# Patient Record
Sex: Female | Born: 1970 | Race: White | Hispanic: No
Health system: Midwestern US, Community
[De-identification: ages and names within clinical notes are randomized; demographics above are authoritative.]

## PROBLEM LIST (undated history)

## (undated) DIAGNOSIS — R768 Other specified abnormal immunological findings in serum: Secondary | ICD-10-CM

## (undated) DIAGNOSIS — Z0000 Encounter for general adult medical examination without abnormal findings: Secondary | ICD-10-CM

## (undated) DIAGNOSIS — I38 Endocarditis, valve unspecified: Secondary | ICD-10-CM

## (undated) DIAGNOSIS — F4310 Post-traumatic stress disorder, unspecified: Secondary | ICD-10-CM

## (undated) DIAGNOSIS — R000 Tachycardia, unspecified: Secondary | ICD-10-CM

## (undated) DIAGNOSIS — C801 Malignant (primary) neoplasm, unspecified: Secondary | ICD-10-CM

## (undated) DIAGNOSIS — N179 Acute kidney failure, unspecified: Secondary | ICD-10-CM

## (undated) DIAGNOSIS — I2699 Other pulmonary embolism without acute cor pulmonale: Secondary | ICD-10-CM

## (undated) DIAGNOSIS — F419 Anxiety disorder, unspecified: Secondary | ICD-10-CM

## (undated) DIAGNOSIS — F32A Depression, unspecified: Secondary | ICD-10-CM

## (undated) DIAGNOSIS — F209 Schizophrenia, unspecified: Secondary | ICD-10-CM

## (undated) DIAGNOSIS — I7300 Raynaud's syndrome without gangrene: Secondary | ICD-10-CM

## (undated) DIAGNOSIS — F329 Major depressive disorder, single episode, unspecified: Secondary | ICD-10-CM

## (undated) DIAGNOSIS — C55 Malignant neoplasm of uterus, part unspecified: Secondary | ICD-10-CM

## (undated) DIAGNOSIS — D72829 Elevated white blood cell count, unspecified: Secondary | ICD-10-CM

## (undated) DIAGNOSIS — K769 Liver disease, unspecified: Secondary | ICD-10-CM

## (undated) HISTORY — PX: UTERINE FIBROID SURGERY: SHX826

## (undated) HISTORY — PX: NO PAST SURGERIES: SHX2092

---

## 2001-07-07 ENCOUNTER — Emergency Department (HOSPITAL_COMMUNITY): Admission: EM | Admit: 2001-07-07 | Discharge: 2001-07-07 | Payer: Self-pay | Admitting: *Deleted

## 2003-09-01 ENCOUNTER — Emergency Department (HOSPITAL_COMMUNITY): Admission: EM | Admit: 2003-09-01 | Discharge: 2003-09-02 | Payer: Self-pay | Admitting: Emergency Medicine

## 2003-09-04 ENCOUNTER — Emergency Department (HOSPITAL_COMMUNITY): Admission: EM | Admit: 2003-09-04 | Discharge: 2003-09-04 | Payer: Self-pay | Admitting: Emergency Medicine

## 2004-09-13 ENCOUNTER — Emergency Department (HOSPITAL_COMMUNITY): Admission: EM | Admit: 2004-09-13 | Discharge: 2004-09-13 | Payer: Self-pay | Admitting: Emergency Medicine

## 2004-09-28 ENCOUNTER — Ambulatory Visit: Payer: Self-pay | Admitting: Internal Medicine

## 2004-09-28 ENCOUNTER — Encounter: Admission: RE | Admit: 2004-09-28 | Discharge: 2004-09-28 | Payer: Self-pay | Admitting: Family Medicine

## 2004-10-16 ENCOUNTER — Ambulatory Visit (HOSPITAL_COMMUNITY): Admission: RE | Admit: 2004-10-16 | Discharge: 2004-10-16 | Payer: Self-pay | Admitting: Family Medicine

## 2004-10-23 ENCOUNTER — Ambulatory Visit: Payer: Self-pay | Admitting: Cardiology

## 2005-08-09 ENCOUNTER — Emergency Department (HOSPITAL_COMMUNITY): Admission: EM | Admit: 2005-08-09 | Discharge: 2005-08-10 | Payer: Self-pay | Admitting: Emergency Medicine

## 2005-11-06 ENCOUNTER — Ambulatory Visit: Payer: Self-pay | Admitting: Internal Medicine

## 2006-06-10 ENCOUNTER — Emergency Department (HOSPITAL_COMMUNITY): Admission: EM | Admit: 2006-06-10 | Discharge: 2006-06-10 | Payer: Self-pay | Admitting: Emergency Medicine

## 2006-10-21 ENCOUNTER — Emergency Department: Payer: Self-pay | Admitting: Emergency Medicine

## 2007-06-22 ENCOUNTER — Emergency Department (HOSPITAL_COMMUNITY): Admission: EM | Admit: 2007-06-22 | Discharge: 2007-06-23 | Payer: Self-pay | Admitting: Emergency Medicine

## 2008-05-04 ENCOUNTER — Emergency Department (HOSPITAL_COMMUNITY): Admission: EM | Admit: 2008-05-04 | Discharge: 2008-05-05 | Payer: Self-pay | Admitting: Emergency Medicine

## 2008-05-05 ENCOUNTER — Emergency Department (HOSPITAL_COMMUNITY): Admission: EM | Admit: 2008-05-05 | Discharge: 2008-05-05 | Payer: Self-pay | Admitting: Emergency Medicine

## 2010-07-18 LAB — POCT I-STAT, CHEM 8
BUN: 10 mg/dL (ref 6–23)
Calcium, Ion: 1.14 mmol/L (ref 1.12–1.32)
Calcium, Ion: 1.16 mmol/L (ref 1.12–1.32)
Chloride: 99 meq/L (ref 96–112)
Creatinine, Ser: 0.7 mg/dL (ref 0.4–1.2)
Creatinine, Ser: 0.7 mg/dL (ref 0.4–1.2)
Glucose, Bld: 99 mg/dL (ref 70–99)
HCT: 38 % (ref 36.0–46.0)
HCT: 40 % (ref 36.0–46.0)
Hemoglobin: 12.9 g/dL (ref 12.0–15.0)
Potassium: 3.5 meq/L (ref 3.5–5.1)
Sodium: 139 meq/L (ref 135–145)
TCO2: 26 mmol/L (ref 0–100)

## 2010-07-18 LAB — POCT CARDIAC MARKERS
CKMB, poc: 4.2 ng/mL (ref 1.0–8.0)
Myoglobin, poc: 120 ng/mL (ref 12–200)
Troponin i, poc: <0.05 ng/mL (ref 0.00–0.09)

## 2010-07-18 LAB — RAPID URINE DRUG SCREEN, HOSP PERFORMED
Amphetamines: NOT DETECTED
Barbiturates: NOT DETECTED
Cocaine: NOT DETECTED
Tetrahydrocannabinol: NOT DETECTED

## 2010-08-18 NOTE — Assessment & Plan Note (Signed)
[facility]                           GASTROENTEROLOGY OFFICE NOTE   GIZZELLE, LACOMB                         MRN:          [id number]  DATE:11/06/2005                            DOB:          1971/02/25    CHIEF COMPLAINT:  Constipation.   HISTORY:  [name] was seen by me for chest pain and dysphagia problems  back at the end of June.  That is not so much of a problem now.  I though  she was probably having some panic attacks.  She also has chronic  constipation, and was prescribed Glycolax, though she has not taken that  every day.  She has not moved her bowels in 9 days.  She takes Vicodin for  headaches, though she has cut back to 1 a day now.  She feels bloated and  distended.  She called her primary care, and they suggested she go to the  Emergency Room for this problem.  She has not vomited.   PAST MEDICAL HISTORY:  Reviewed and unchanged from previous.   MEDICATIONS:  Listed and reviewed in the chart.   PHYSICAL EXAMINATION:  GENERAL:  Well-developed young white woman in no  acute distress.  VITAL SIGNS:  Height 5 feet 6 inches, weight 154 pounds, pulse 60, blood  pressure 122/70.  EYES:  Anicteric.  ABDOMEN:  Soft, nontender, no organomegaly or mass.  RECTAL:  Exam in the presence of female nursing staff demonstrates that she  has a firm fecal impaction with a large amount of stool in the rectal vault.  There was no blood seen on the finger.   ASSESSMENT:  1.  Fecal impaction, exacerbation of chronic constipation/irritable bowel      syndrome.  Some of this is attributable to chronic narcotic use.  2.  History of panic attack and transient dysphagia, which has not really      recurred as best I can tell.  3.  Family history of colon cancer in a grandmother in her 15s.   PLAN:  1.  She is to take a 2-4 liter colon prep tonight.  This is prescribed, to      get her bowels moving.  2.  Subsequent to that she should take Glycolax  every day.  I have      specifically instructed her to take it every day unless she is having      diarrhea.  3.  See me again in about 6 weeks.  4.  See primary care regarding chronic headaches and need for Vicodin, as      this is not a good long-term treatment.  This was explained to her, how      we do not want her to have pain, but she should have other treatment if      possible that would have less side effects and not be potentially habit-      forming.   Given the overall chronic history of constipation and the narcotic usage,  and lack of other worrisome features, I do not think an endoscopic  evaluation is indicated,  but I will reconsider that when she returns for  followup, depending upon the clinical course.                                   Iva Boop, MD, [facility]   CEG/MedQ  DD:  11/06/2005  DT:  11/07/2005  Job #:  [id number]   cc:   Dr. Jari Pigg

## 2010-09-07 ENCOUNTER — Inpatient Hospital Stay (HOSPITAL_COMMUNITY): Payer: Medicaid Other

## 2010-09-07 ENCOUNTER — Inpatient Hospital Stay (HOSPITAL_COMMUNITY)
Admission: EM | Admit: 2010-09-07 | Discharge: 2010-09-10 | DRG: 641 | Disposition: A | Discharge status: Other Acute Inpatient Hospital | Payer: Medicaid Other | Attending: Internal Medicine | Admitting: Internal Medicine

## 2010-09-07 DIAGNOSIS — F200 Paranoid schizophrenia: Secondary | ICD-10-CM | POA: Diagnosis present

## 2010-09-07 DIAGNOSIS — E876 Hypokalemia: Principal | ICD-10-CM | POA: Diagnosis present

## 2010-09-07 DIAGNOSIS — D72829 Elevated white blood cell count, unspecified: Secondary | ICD-10-CM | POA: Diagnosis present

## 2010-09-07 DIAGNOSIS — Z79899 Other long term (current) drug therapy: Secondary | ICD-10-CM

## 2010-09-07 DIAGNOSIS — F4310 Post-traumatic stress disorder, unspecified: Secondary | ICD-10-CM | POA: Diagnosis present

## 2010-09-07 DIAGNOSIS — I7300 Raynaud's syndrome without gangrene: Secondary | ICD-10-CM | POA: Diagnosis present

## 2010-09-07 DIAGNOSIS — Z9119 Patient's noncompliance with other medical treatment and regimen: Secondary | ICD-10-CM

## 2010-09-07 DIAGNOSIS — E860 Dehydration: Secondary | ICD-10-CM | POA: Diagnosis present

## 2010-09-07 DIAGNOSIS — R197 Diarrhea, unspecified: Secondary | ICD-10-CM | POA: Diagnosis present

## 2010-09-07 DIAGNOSIS — Z91199 Patient's noncompliance with other medical treatment and regimen due to unspecified reason: Secondary | ICD-10-CM

## 2010-09-07 LAB — CBC
HCT: 42 % (ref 36.0–46.0)
RBC: 4.62 MIL/uL (ref 3.87–5.11)
WBC: 18.5 10*3/uL — ABNORMAL HIGH (ref 4.0–10.5)

## 2010-09-07 LAB — RAPID URINE DRUG SCREEN, HOSP PERFORMED
Barbiturates: NOT DETECTED
Cocaine: NOT DETECTED
Opiates: NOT DETECTED

## 2010-09-07 LAB — URINALYSIS, ROUTINE W REFLEX MICROSCOPIC
Bilirubin Urine: NEGATIVE
Hgb urine dipstick: NEGATIVE
Ketones, ur: NEGATIVE mg/dL
Protein, ur: NEGATIVE mg/dL
Urobilinogen, UA: 0.2 mg/dL (ref 0.0–1.0)

## 2010-09-07 LAB — DIFFERENTIAL
Basophils Absolute: 0 10*3/uL (ref 0.0–0.1)
Basophils Relative: 0 % (ref 0–1)
Eosinophils Relative: 0 % (ref 0–5)
Lymphs Abs: 2.5 10*3/uL (ref 0.7–4.0)
Monocytes Relative: 9 % (ref 3–12)

## 2010-09-07 LAB — COMPREHENSIVE METABOLIC PANEL
ALT: 22 U/L (ref 0–35)
CO2: 39 mEq/L — ABNORMAL HIGH (ref 19–32)
Chloride: 88 mEq/L — ABNORMAL LOW (ref 96–112)
GFR calc Af Amer: >60 mL/min (ref >60)
Sodium: 136 mEq/L (ref 135–145)
Total Bilirubin: 0.4 mg/dL (ref 0.3–1.2)

## 2010-09-08 LAB — DIFFERENTIAL
Basophils Absolute: 0.1 10*3/uL (ref 0.0–0.1)
Basophils Relative: 1 % (ref 0–1)
Eosinophils Absolute: 0.2 10*3/uL (ref 0.0–0.7)
Lymphocytes Relative: 21 % (ref 12–46)
Lymphs Abs: 2.3 10*3/uL (ref 0.7–4.0)
Monocytes Relative: 14 % — ABNORMAL HIGH (ref 3–12)

## 2010-09-08 LAB — BASIC METABOLIC PANEL
BUN: 4 mg/dL — ABNORMAL LOW (ref 6–23)
CO2: 33 mEq/L — ABNORMAL HIGH (ref 19–32)
Calcium: 8.8 mg/dL (ref 8.4–10.5)
Potassium: 2.7 mEq/L — CL (ref 3.5–5.1)
Sodium: 137 mEq/L (ref 135–145)

## 2010-09-08 LAB — CBC
MCH: 31.1 pg (ref 26.0–34.0)
MCHC: 33.8 g/dL (ref 30.0–36.0)
Platelets: 380 10*3/uL (ref 150–400)

## 2010-09-08 LAB — LIPID PANEL
Cholesterol: 159 mg/dL (ref 0–200)
LDL Cholesterol: 97 mg/dL (ref 0–99)
Total CHOL/HDL Ratio: 3.8 RATIO
VLDL: 20 mg/dL (ref 0–40)

## 2010-09-08 LAB — CLOSTRIDIUM DIFFICILE BY PCR: Toxigenic C. Difficile by PCR: NEGATIVE

## 2010-09-08 LAB — PHOSPHORUS: Phosphorus: 2.3 mg/dL (ref 2.3–4.6)

## 2010-09-08 LAB — POTASSIUM: Potassium: 3 mEq/L — ABNORMAL LOW (ref 3.5–5.1)

## 2010-09-08 LAB — GRAM STAIN

## 2010-09-09 LAB — BASIC METABOLIC PANEL
BUN: 4 mg/dL — ABNORMAL LOW (ref 6–23)
Chloride: 99 mEq/L (ref 96–112)
GFR calc non Af Amer: >60 mL/min (ref >60)
Glucose, Bld: 100 mg/dL — ABNORMAL HIGH (ref 70–99)
Potassium: 3.1 mEq/L — ABNORMAL LOW (ref 3.5–5.1)
Sodium: 140 mEq/L (ref 135–145)

## 2010-09-09 LAB — CBC
HCT: 39.1 % (ref 36.0–46.0)
Hemoglobin: 13.4 g/dL (ref 12.0–15.0)
MCHC: 34.3 g/dL (ref 30.0–36.0)
RBC: 4.2 MIL/uL (ref 3.87–5.11)
WBC: 8.6 10*3/uL (ref 4.0–10.5)

## 2010-09-09 LAB — POTASSIUM: Potassium: 3.2 mEq/L — ABNORMAL LOW (ref 3.5–5.1)

## 2010-09-09 LAB — FECAL LACTOFERRIN, QUANT: Fecal Lactoferrin: NEGATIVE

## 2010-09-09 NOTE — Consult Note (Signed)
[name], [name]                  ACCOUNT NO.:  [id number]  MEDICAL RECORD NO.:  [id number]  LOCATION:  4715                         FACILITY:  [facility]  PHYSICIAN:  Eulogio Ditch, MD DATE OF BIRTH:  29-Oct-1970  DATE OF CONSULTATION:  09/08/2010 DATE OF DISCHARGE:                                CONSULTATION   REASON FOR CONSULTATION:  History of schizophrenia, paranoid type.  HISTORY OF PRESENT ILLNESS:  A 40 year old female with history of schizophrenia, paranoid type, who was admitted on the medical floor because of the hypokalemia.  The patient reported that she was not eating well and she was not taking her medication as she was forgetting to take her medications.  The patient is on IVC filter from the [facility], came to [facility] for admission to Psychiatry, want to go to [place]. The patient's boyfriend was contacted and as per him, the patient was paranoid, was faking eating, and doing bizarre behavior.  The patient was also not sleeping well.  PAST PSYCHIATRIC HISTORY:  The patient reported that she was admitted in Psychiatry 10 years ago.  She has never tried to kill herself and she is following at the [facility], now [place].  The patient reported she is on Prolixin, diazepam, Cogentin, and trazodone, but she do not remember the doses of these medications.  SUBSTANCE ABUSE HISTORY:  The patient denies abusing any drugs or alcohol, but her urine toxicology is positive for marijuana.  SOCIAL HISTORY:  The patient lives with her boyfriend, is on disability.  MEDICAL HISTORY:  History of Raynaud disease.  ALLERGIES:  She is allergic to DARVOCET.  MENTAL STATUS EXAM:  Currently, the patient is calm and cooperative during the interview.  Fair eye contact.  No abnormal movements noticed. No psychomotor agitation or retardation noted during the interview. Speech is soft and slow.  Mood as per the patient, okay.  Affect, slightly constricted.  Thought  process, the patient is logical and goal directed.  Reported she is feeling well now and she has also started eating in the hospital.  The patient was redirectable during the interview.  Her thought process was slow, but was goal directed.  The patient denied any suicidal or homicidal ideation, was not paranoid during the interview.  The patient denies hearing any voices, does not seem to be internally preoccupied.  Cognition, alert, awake, and oriented x3.  Memory, immediate.  Recent remote, poor.  Attention and concentration poor.  Abstraction ability, fair.  Insight and judgment, poor.  DIAGNOSES:  Axis I:  As per history, schizophrenia, paranoid type. Axis II:  Deferred. Axis III:  History of Raynaud's disease. Axis IV:  Noncompliant with her psych meds.  Chronic mental health issues. Axis V:  40.  RECOMMENDATIONS: 1. I started the patient back on Prolixin 5 mg twice a day. 2. I also started the Cogentin.  The patient is already on diazepam. 3. I told the patient that we will admit the patient in the inpatient     setting to observe her further as she was noncompliant with her     medication and was not doing right.  The patient is on IVC.  Once     the patient is medically cleared, the patient can be transferred to     behavioral health for further stabilization.  Thanks for involving me in taking care of this patient.     Eulogio Ditch, MD     SA/MEDQ  D:  09/08/2010  T:  09/08/2010  Job:  [id number]  Electronically Signed by Eulogio Ditch  on 09/09/2010 06:04:35 PM

## 2010-09-10 ENCOUNTER — Inpatient Hospital Stay (HOSPITAL_COMMUNITY)
Admission: AD | Admit: 2010-09-10 | Discharge: 2010-09-15 | DRG: 885 | Disposition: A | Discharge status: Home / Self Care | Payer: Medicaid Other | Attending: Psychiatry | Admitting: Psychiatry

## 2010-09-10 DIAGNOSIS — F411 Generalized anxiety disorder: Secondary | ICD-10-CM

## 2010-09-10 DIAGNOSIS — R197 Diarrhea, unspecified: Secondary | ICD-10-CM

## 2010-09-10 DIAGNOSIS — Z91199 Patient's noncompliance with other medical treatment and regimen due to unspecified reason: Secondary | ICD-10-CM

## 2010-09-10 DIAGNOSIS — F200 Paranoid schizophrenia: Principal | ICD-10-CM

## 2010-09-10 DIAGNOSIS — E876 Hypokalemia: Secondary | ICD-10-CM

## 2010-09-10 DIAGNOSIS — IMO0002 Reserved for concepts with insufficient information to code with codable children: Secondary | ICD-10-CM

## 2010-09-10 DIAGNOSIS — I7300 Raynaud's syndrome without gangrene: Secondary | ICD-10-CM

## 2010-09-10 DIAGNOSIS — E860 Dehydration: Secondary | ICD-10-CM

## 2010-09-10 DIAGNOSIS — F3289 Other specified depressive episodes: Secondary | ICD-10-CM

## 2010-09-10 DIAGNOSIS — F4310 Post-traumatic stress disorder, unspecified: Secondary | ICD-10-CM

## 2010-09-10 DIAGNOSIS — Z9119 Patient's noncompliance with other medical treatment and regimen: Secondary | ICD-10-CM

## 2010-09-10 DIAGNOSIS — F329 Major depressive disorder, single episode, unspecified: Secondary | ICD-10-CM

## 2010-09-10 LAB — BASIC METABOLIC PANEL
BUN: <3 mg/dL — ABNORMAL LOW (ref 6–23)
CO2: 31 mEq/L (ref 19–32)
Chloride: 101 mEq/L (ref 96–112)
GFR calc Af Amer: >60 mL/min (ref >60)
Potassium: 3.4 mEq/L — ABNORMAL LOW (ref 3.5–5.1)

## 2010-09-11 DIAGNOSIS — F200 Paranoid schizophrenia: Secondary | ICD-10-CM

## 2010-09-11 LAB — STOOL CULTURE

## 2010-09-11 LAB — GIARDIA/CRYPTOSPORIDIUM SCREEN(EIA): Giardia Screen - EIA: NEGATIVE

## 2010-09-12 LAB — COMPREHENSIVE METABOLIC PANEL
ALT: 26 U/L (ref 0–35)
AST: 38 U/L — ABNORMAL HIGH (ref 0–37)
Alkaline Phosphatase: 76 U/L (ref 39–117)
CO2: 27 mEq/L (ref 19–32)
Calcium: 9.6 mg/dL (ref 8.4–10.5)
Chloride: 97 mEq/L (ref 96–112)
GFR calc non Af Amer: >60 mL/min (ref >60)
Potassium: 3.2 mEq/L — ABNORMAL LOW (ref 3.5–5.1)
Sodium: 136 mEq/L (ref 135–145)
Total Bilirubin: 0.2 mg/dL — ABNORMAL LOW (ref 0.3–1.2)

## 2010-09-12 LAB — DIFFERENTIAL
Basophils Relative: 1 % (ref 0–1)
Eosinophils Absolute: 0.2 10*3/uL (ref 0.0–0.7)
Neutro Abs: 7.4 10*3/uL (ref 1.7–7.7)
Neutrophils Relative %: 59 % (ref 43–77)

## 2010-09-12 LAB — CBC
Platelets: 346 10*3/uL (ref 150–400)
RBC: 4.75 MIL/uL (ref 3.87–5.11)
WBC: 12.6 10*3/uL — ABNORMAL HIGH (ref 4.0–10.5)

## 2010-09-14 NOTE — Discharge Summary (Signed)
[name], [name]                  ACCOUNT NO.:  [id number]  MEDICAL RECORD NO.:  [id number]  LOCATION:  4715                         FACILITY:  [facility]  PHYSICIAN:  Thad Ranger, MD       DATE OF BIRTH:  [birth year]  DATE OF ADMISSION:  09/07/2010 DATE OF DISCHARGE:                              DISCHARGE SUMMARY   PRIMARY CARE PHYSICIAN:  [facility].  DISCHARGE DIAGNOSES: 1. Hypokalemia secondary to diarrhea, improved. 2. Diarrhea improved. 3. Clostridium difficile negative. 4. History of schizophrenia with acute psychosis. 5. Dehydration.  CONSULTATIONS:  Psychiatry, Eulogio Ditch, MD  DISCHARGE MEDICATIONS: 1. Benztropine 1 mg p.o. twice daily. 2. Valium 10 mg p.o. every 8 hours as needed. 3. Prolixin 5 mg p.o. b.i.d. 4. Imodium 2 mg 1-4 tablets daily as needed for diarrhea max 16 mg 24     hours. 5. Flagyl 500 mg p.o. every 8 hours for 5 days. 6. Resource nutritional supplement daily with a meal. 7. Florastor 250 mg p.o. b.i.d. for 7 days.  BRIEF HISTORY OF PRESENT ILLNESS:  [name] is a 40 year old female with a history of posttraumatic stress disorder, Raynaud disease, paranoid schizophrenia was brought in involuntary commitment papers for medical clearance to be admitted to [facility] for medication regulation.  The patient reported that she had not been taking her medications for schizophrenia lately.  She had not slept for 3 days.  She came to the hospital and was found to have leukocytosis at 18.5 with a hypokalemia at 2.1 and hospitalist service was requested for admission.  She also reported diarrhea the day prior to admission but no chest pain, shortness of breath, any headaches, fevers or chills.  RADIOLOGICAL DATA:  Chest x-ray June 7, linear parenchymal changes adjacent to the left heart border may represent atelectasis and can be evaluated follow up chest x-ray when the patient is able, no infiltrate, CHF or pneumothorax.  Heart top normal  in size.  Urine drug screen positive for benzodiazepine and tetrahydrocannabinol on September 07, 2010.  CBC at the time of admission; white count 18.5 with a hemoglobin of 15.1, hematocrit 42.0, platelets 391.  UA was negative for any UTI. Alcohol level less than 11.  CMP showed sodium 136, potassium 2.1, BUN 5, creatinine 0.64.  Lipid profile; cholesterol 59, LDL 97, TSH 0.747, free T4 1.5.  Stool Gram stain and culture showed lower GI flora, moderate cheese.  C. diff was negative.  CBC at the time of the dictation; white count 8.6, hemoglobin 13.4, hematocrit 9.1, platelets 351.  BMET; sodium 140, potassium 3.1.  This was drawn 5:00 a.m. this morning.  BUN 4, creatinine 0.53, calcium 8.7.  Fecal lactoferrin negative.  BRIEF HOSPITALIZATION COURSE:  [name] is a 40 year old female with a history of paranoid schizophrenia who was admitted under medicine service for hypokalemia, likely secondary to diarrhea. 1. Hypokalemia, likely secondary to diarrhea.  The patient received     aggressive potassium replacement.  Magnesium level was normal.  The     diarrhea has currently resolved.  C. diff was negative.  Gram stain     and culture showed GI flora with moderate wbcs.  Fecal leukocytes     were negative.  The patient was started on p.o. Flagyl.  As the     diarrhea has improved, I recommend to discontinue Flagyl for 5 days     and continue Florastor.  Since C. diff is negative, the patient can     be given Imodium as needed for the diarrhea. 2. Hypokalemia was 3.1 this morning and the patient received 60 mEq of     potassium.  She is not having any diarrhea at this time.  This     diarrhea has resolved at this time.  Most likely potassium will     stay in the normal range.  We will have a repeat potassium level     checked prior to the transfer to [facility]. 3. Dehydration.  CBC at the time of admission had shown hemoglobin of     15.1, hematocrit of 42.0.  Appears to be hemoconcentrated due to      dehydration and diarrhea.  The patient was given gentle IV fluid     hydration during the hospitalization.  The patient will be     discharged to [facility] today awaiting next potassium level check.  PHYSICAL EXAMINATION:  VITAL SIGNS:  At the time of the dictation; temperature 98.5, pulse 94, respirations 18, blood pressure 128/82, O2 sats 98% on room air. GENERAL:  The patient is alert and awake, not in any acute distress. HEENT:  Nonicteric sclerae and conjunctivae.  Pupils are reactive to light and accommodation.  EOMI. NECK:  Supple.  No lymphopathy.  No JVD. CARDIOVASCULAR:  S1 and S2 clear. CHEST:  Clear to auscultation bilaterally. ABDOMEN:  Soft, nontender, nondistended.  Normal bowel sounds. EXTREMITIES:  No cyanosis, clubbing or edema noted in the upper and lower extremities bilaterally.  DISPOSITION:  The patient will be discharged to [facility] awaiting another potassium level check.  Discharge time 35 minutes.     Thad Ranger, MD     RR/MEDQ  D:  09/09/2010  T:  09/09/2010  Job:  [id number]  cc:   [facility] Eulogio Ditch, MD  Electronically Signed by Andres Labrum Hyrum Shaneyfelt  on 09/14/2010 03:31:32 PM
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
## 2010-09-17 ENCOUNTER — Inpatient Hospital Stay (HOSPITAL_COMMUNITY)
Admission: RE | Admit: 2010-09-17 | Discharge: 2010-09-21 | DRG: 885 | Disposition: A | Discharge status: Home / Self Care | Payer: Medicaid Other | Source: Ambulatory Visit | Attending: Psychiatry | Admitting: Psychiatry

## 2010-09-17 ENCOUNTER — Emergency Department (HOSPITAL_COMMUNITY)
Admission: EM | Admit: 2010-09-17 | Discharge: 2010-09-17 | Disposition: A | Discharge status: Other Acute Inpatient Hospital | Payer: Medicaid Other | Source: Home / Self Care | Attending: Emergency Medicine | Admitting: Emergency Medicine

## 2010-09-17 DIAGNOSIS — F200 Paranoid schizophrenia: Principal | ICD-10-CM

## 2010-09-17 DIAGNOSIS — E876 Hypokalemia: Secondary | ICD-10-CM

## 2010-09-17 DIAGNOSIS — Z560 Unemployment, unspecified: Secondary | ICD-10-CM

## 2010-09-17 DIAGNOSIS — Z9119 Patient's noncompliance with other medical treatment and regimen: Secondary | ICD-10-CM

## 2010-09-17 DIAGNOSIS — F329 Major depressive disorder, single episode, unspecified: Secondary | ICD-10-CM

## 2010-09-17 DIAGNOSIS — F259 Schizoaffective disorder, unspecified: Secondary | ICD-10-CM

## 2010-09-17 DIAGNOSIS — F3289 Other specified depressive episodes: Secondary | ICD-10-CM

## 2010-09-17 DIAGNOSIS — Z91199 Patient's noncompliance with other medical treatment and regimen due to unspecified reason: Secondary | ICD-10-CM

## 2010-09-17 LAB — COMPREHENSIVE METABOLIC PANEL
Albumin: 4 g/dL (ref 3.5–5.2)
Alkaline Phosphatase: 73 U/L (ref 39–117)
BUN: <3 mg/dL — ABNORMAL LOW (ref 6–23)
Creatinine, Ser: <0.47 mg/dL — ABNORMAL LOW (ref 0.50–1.10)
Potassium: 2.9 mEq/L — ABNORMAL LOW (ref 3.5–5.1)
Total Protein: 7.3 g/dL (ref 6.0–8.3)

## 2010-09-17 LAB — DIFFERENTIAL
Basophils Absolute: 0 10*3/uL (ref 0.0–0.1)
Eosinophils Absolute: 0 10*3/uL (ref 0.0–0.7)
Eosinophils Relative: 0 % (ref 0–5)
Lymphocytes Relative: 13 % (ref 12–46)
Monocytes Absolute: 1.4 10*3/uL — ABNORMAL HIGH (ref 0.1–1.0)

## 2010-09-17 LAB — CBC
HCT: 41.7 % (ref 36.0–46.0)
MCHC: 36.5 g/dL — ABNORMAL HIGH (ref 30.0–36.0)
RDW: 13.8 % (ref 11.5–15.5)

## 2010-09-17 LAB — RAPID URINE DRUG SCREEN, HOSP PERFORMED
Cocaine: NOT DETECTED
Opiates: POSITIVE — AB

## 2010-09-17 LAB — URINALYSIS, ROUTINE W REFLEX MICROSCOPIC
Leukocytes, UA: NEGATIVE
Nitrite: NEGATIVE
Specific Gravity, Urine: 1.011 (ref 1.005–1.030)
Urobilinogen, UA: 0.2 mg/dL (ref 0.0–1.0)

## 2010-09-17 LAB — ETHANOL: Alcohol, Ethyl (B): <11 mg/dL (ref 0–11)

## 2010-09-17 LAB — POCT PREGNANCY, URINE: Preg Test, Ur: NEGATIVE

## 2010-09-18 DIAGNOSIS — F200 Paranoid schizophrenia: Secondary | ICD-10-CM

## 2010-09-18 LAB — COMPREHENSIVE METABOLIC PANEL
ALT: 18 U/L (ref 0–35)
AST: 31 U/L (ref 0–37)
Albumin: 3.2 g/dL — ABNORMAL LOW (ref 3.5–5.2)
Alkaline Phosphatase: 60 U/L (ref 39–117)
CO2: 31 mEq/L (ref 19–32)
Chloride: 95 mEq/L — ABNORMAL LOW (ref 96–112)
Potassium: 2.8 mEq/L — ABNORMAL LOW (ref 3.5–5.1)
Sodium: 136 mEq/L (ref 135–145)
Total Bilirubin: 0.2 mg/dL — ABNORMAL LOW (ref 0.3–1.2)

## 2010-09-18 LAB — CBC
Platelets: 393 10*3/uL (ref 150–400)
RBC: 4.34 MIL/uL (ref 3.87–5.11)
RDW: 14.3 % (ref 11.5–15.5)
WBC: 13.2 10*3/uL — ABNORMAL HIGH (ref 4.0–10.5)

## 2010-09-18 LAB — DIFFERENTIAL
Eosinophils Relative: 1 % (ref 0–5)
Lymphocytes Relative: 29 % (ref 12–46)
Lymphs Abs: 3.8 10*3/uL (ref 0.7–4.0)
Monocytes Absolute: 1.5 10*3/uL — ABNORMAL HIGH (ref 0.1–1.0)

## 2010-09-18 NOTE — H&P (Signed)
NAMEBICH, MCHANEY                  ACCOUNT NO.:  [id number]  MEDICAL RECORD NO.:  [id number]  LOCATION:  0503                          FACILITY:  [facility]  PHYSICIAN:  Franchot Gallo, MD     DATE OF BIRTH:  1971-02-27  DATE OF ADMISSION:  09/17/2010 DATE OF DISCHARGE:                      PSYCHIATRIC ADMISSION ASSESSMENT   CHIEF COMPLAINT:  "I stopped my medicines and did not follow up with my outpatient appointment."  HISTORY OF PRESENT ILLNESS:  [name] is a 40 year old divorced white female who was recently hospitalized at [facility] until September 12, 2010.  The patient was asymptomatic at time of discharge but states that when she returned home, she stopped taking her p.m. medications and also did not keep her followup appointment with [facility] which was scheduled for the day after admission.  The patient states that she began to sleep excessively and also decreased her p.o. intake.  She reports that she began to experience visual hallucinations as well as some depressive symptoms and felt that she needed to return for further treatment.  Currently the patient states that she is sleeping well without difficulty but reports a decreased appetite.  She reports mild feelings of sadness, anhedonia and depressed mood but denies any suicidal or homicidal ideations today.  The patient reports some visual hallucinations but denies any auditory hallucinations or delusional thinking.  She also denies any prolonged manic or hypomanic symptoms.  The patient was readmitted for further treatment of her psychiatric symptoms.  PAST PSYCHIATRIC HISTORY:  The patient reports numerous past psychiatric hospitalization and use of psychiatric medications.  PAST MEDICAL HISTORY:    Current medications: 1. Prolixin 5 mg p.o. b.i.d. 2. Benztropine 1 mg p.o. b.i.d.. 3. Gabapentin 400 mg p.o. at 7 a.m., 2 p.m. and 10 p.m. 4. Risperdal 1 mg p.o. nightly. 5. Saccharomyces boulardii 250 mg  p.o. b.i.d. 6. Zoloft 50 mg p.o. q.a.m. 7. Valium 5 mg p.o. q.8 h p.r.n. for anxiety.  ALLERGIES:  NKDA.  MEDICAL ILLNESSES:  History of hypokalemia.  Most recent serum potassium level performed on September 17, 2010 equal to 0.9.  PAST OPERATIONS:  None reported.  FAMILY HISTORY:  The patient denies any family history of psychiatric or substance abuse related illnesses.  SOCIAL HISTORY:  As stated above, the patient reports that she is divorced and currently lives with a boyfriend of 9 years.  She is unemployed and receives disability benefits.  She does report completing her GED and has a son who is 78 and two daughters 94 and 67, all in good health.  MENTAL STATUS EXAM:  General - the patient was somewhat sedated but oriented x3.  Speech was appropriate in terms of rate and volume.  Mood appeared moderately depressed.  Affect appeared flat.  Thoughts - the patient denied any delusions or auditory hallucinations but reported some visual hallucinations.  She denied any suicidal or homicidal ideations.  Judgment and insight today both appeared fair.  IMPRESSION:   AXIS I:  Schizophrenia - paranoid type versus schizoaffective disorder - depressed type. AXIS II:  Deferred. AXIS III:  Please see medical history above. AXIS IV:  Limited primary support system.  Unemployment.  Noncompliance with  medications.  Serious chronic mental illness. AXIS V:  Global assessment of functioning at time of admission approximately 35.  Highest global assessment of functioning in past year approximately 55.  PLAN: 1. The patient was restarted on the medication Zoloft at 50 mg p.o.     q.a.m. to address her depressive symptoms. 2. The patient was continued on the medication Prolixin 5 mg p.o.     b.i.d. to began to address her psychotic symptoms. 3. The patient was also continued on Risperdal 1 mg p.o. nightly to     also address her psychotic symptoms. 4. The patient was continued on Cogentin 1 mg  p.o. b.i.d. to help with     any EPS related to her antipsychotic medications. 5. The patient was continued on medication gabapentin at 400 mg p.o.    at 7 a.m., 2 p.m. and 10 p.m. to address her reports of anxiety. 6. The patient was continued on the medication Valium at 5 mg p.o. q.8     h p.r.n. for anxiety. 7. The patient was continued on her other non-psychiatric medications     as prescribed. 8. The patient will continue to be monitored for dangerousness to self     and/or others. 9. The patient will participate in group and unit activities per     routine.    __________________________________ Franchot Gallo, MD     RR/MEDQ  D:  09/18/2010  T:  09/18/2010  Job:  [id number]  Electronically Signed by Franchot Gallo MD on 09/18/2010 05:37:39 PM

## 2010-09-19 LAB — MAGNESIUM: Magnesium: 1.8 mg/dL (ref 1.5–2.5)
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
## 2010-09-22 NOTE — Discharge Summary (Signed)
[name], [name]                  ACCOUNT NO.:  [id number]  MEDICAL RECORD NO.:  [id number]  LOCATION:  0503                          FACILITY:  [facility]  PHYSICIAN:  Franchot Gallo, MD     DATE OF BIRTH:  12-27-1970  DATE OF ADMISSION:  09/17/2010 DATE OF DISCHARGE:  09/21/2010                              DISCHARGE SUMMARY   REASON FOR ADMISSION:  The patient states she stopped her medication and did not follow with her outpatient appointment.  She was recently in our facility.  Discharged on June 12.  She was asymptomatic at time of discharge but states that when she returned home, she stopped taking her medications and did not keep her appointment.  She also was sleeping excessively,  decreased her fluid intake and began to experience visual hallucinations as well as some depressive symptoms.  FINAL IMPRESSION:  AXIS I: Schizophrenia paranoid type versus schizoaffective disorder depressed type. AXIS II: Deferred. AXIS III: History of hypokalemia. AXIS IV: Limited primary support system, unemployment, noncompliance to medication, serious chronic mental illness. AXIS V:  50-55.  PERTINENT LABS:  Urinalysis was negative.  Urine pregnancy test is negative.  White count elevated at 14.9, potassium is at 2.9.  The patient did receive 40 mEq of potassium chloride.  SIGNIFICANT FINDINGS:  The patient was somewhat sedated but oriented x3. Her speech was appropriate and her mood appeared mildly depressed.  She was admitted to the adult milieu and restarted on her medication, Zoloft, Prolixin, Risperdal at bedtime to address the psychotic symptoms, continue with her Cogentin to help with any EPS related to her antipsychotic and monitored her mood and affect.  The patient's sleep was good.  Her appetite was low.  She was having mild depressive symptoms rating it a 2 on a scale of 1-10.  Denied any suicidal or homicidal thoughts.  She was denying any auditory hallucinations but seeing  spiders.  Having no delusional thinking and having no medication side effects.  We considered ordering her Prolixin injection of 12.5 mg IM.  The patient received her injection.  We continued to monitor her, monitoring her serum potassium, encouraging Gatorade.  The patient's sleep improved.  Her appetite was good, endorsing depressive symptoms, rating it a 2 on a scale of 1-10.  She was beginning to feel much better.  On the day of discharge, the patient was seen in the interdisciplinary treatment team for questions and address any concerns.  The patient was reporting her sleep was good; appetite was good; having mild depressive symptoms.  Adamantly denied any suicidal or homicidal thoughts.  Denied any auditory or visual hallucinations.  She was having no delusional thinking, having mild anxiety, rating it a 3 on a scale of 1-10.  Denied any medication side effects.  The patient was discharged to aftercare. She was having futuristic plans for herself and having improving insight about taking her medications.  Her serum potassium was at 4.1.  DISCHARGE MEDICATIONS: 1. Valium 5 mg q.8 hours p.r.n. 2. Prolixin 12.5 mg IM; last dose given on September 19, 2010.  Do again in     14 days. 3. Benztropine 1 mg taking one tablet b.i.d.  4. Fluphenazine 5 mg one b.i.d. 5. Gabapentin 400 mg one t.i.d. 6. Risperdal 1 mg at bedtime. 7. Zoloft 50 mg one tablet daily.  Follow-up appointment was at [facility] on Friday, June 22.  Phone number [phone].     Landry Corporal, N.P.   ______________________________ Franchot Gallo, MD    JO/MEDQ  D:  09/21/2010  T:  09/22/2010  Job:  [id number]  Electronically Signed by Limmie PatriciaP. on 09/22/2010 02:44:17 PM Electronically Signed by Franchot Gallo MD on 09/22/2010 04:02:58 PM

## 2010-09-29 NOTE — H&P (Signed)
NAMESHEBRA, MULDROW                  ACCOUNT NO.:  [id number]  MEDICAL RECORD NO.:  [id number]  LOCATION:  [facility]                         FACILITY:  [facility]  PHYSICIAN:  Rock Nephew, MD       DATE OF BIRTH:  07-01-1970  DATE OF ADMISSION:  09/07/2010 DATE OF DISCHARGE:                             HISTORY & PHYSICAL   PRIMARY CARE PHYSICIAN:  [facility].  CHIEF COMPLAINT:  Hypokalemia.  HISTORY OF PRESENT ILLNESS:  This is a 40 year old female with a history of post-traumatic stress disorder, Raynaud disease, and paranoid schizophrenia.  The patient was coming in for involuntary commitment papers for medical clearance to be admitted to [facility] for medication regulation.  The patient has a history of paranoid schizophrenia.  The patient reports that she has not been taking her medications lately. She has been having difficulty trusting others and she is standing and talking to higher powers.  She has not slept for 3 days.  She came to the hospital and she was noted to have leukocytosis at 18.5.  The patient also had hypokalemia at 2.1.  We were asked to admit this patient for hypokalemia.  Speaking to the patient currently, the patient is eating her dinner currently.  She denies any discomfort.  She is telling me that now she feels like you can contrast people.  She received some diazepam in the Emergency Department.  She denies any chest pain, any shortness of breath, any headaches, any fevers or chills.  She reports she had some diarrhea yesterday which has since resolved.  Again, we are asked to admit this patient hypokalemia.  PAST MEDICAL HISTORY: 1. Post-traumatic stress disorder. 2. Raynaud disease. 3. Schizophrenia.  She reports that she lives at home with her boyfriend.  SOCIAL HISTORY:  No drug abuse.  Occasional drinker.  She does smoke cigarettes, although her urine toxicology was positive for THC.  ALLERGIES:  DARVOCET.  FAMILY HISTORY:   Hypertension.  HOME MEDICATIONS:  Unknown dosing, but she takes, 1. Cogentin. 2. Trazodone. 3. Valium.  REVIEW OF SYSTEMS:  She denies any headaches, any blurry vision.  She denies any chest pain, any shortness of breath.  Today she denies any nausea or any vomiting.  She denies any abdominal pain.  She denies any constipation, she reports she had some diarrhea earlier.  She denies any burning on urination.  She denies any pain in her legs.  PHYSICAL EXAMINATION:  VITAL SIGNS:  Temperature 98.7, blood pressure 132/91, pulse rate 102, respiratory rate is 18, and she is 95% saturation room air. HEAD, EYES, EARS, NOSE AND THROAT:  Normocephalic, atraumatic.  Pupils are equally round and reactive to light. CARDIOVASCULAR:  S1, S2.  Regular rate and rhythm.  No murmurs.  No rubs. LUNGS:  Clear to auscultation bilaterally.  No wheezes.  No rhonchi. ABDOMEN:  Soft, nontender, nondistended.  Bowel sounds are positive.  No guarding.  No rebound tenderness. EXTREMITIES:  No lower extremity edema is evident. NEUROLOGIC:  Currently she is alert, awake, and oriented x3.  Her affect is somewhat flat.  RADIOLOGICAL STUDIES:  The patient has not had any radiological studies.  LABORATORY STUDIES:  The patient's WBC count is 18.5, hemoglobin is 15.1, hematocrit is 42.0, MCV is 90.9, and platelets of 391.  Sodium is 136, potassium is 2.1, chloride is 88, bicarbonate is 39, BUN is 5, creatinine is 0.64, glucose is 107, total protein 6.7, albumin is 3.5, calcium is 9.5, benzodiazepines positive benzos, positive THC.  Alcohol level is less than 11.  Urine specific gravity is 1.008, negative leukocyte esterase, and negative nitrites.  IMAGING DATA:  The patient's 12-lead EKG shows the following findings. Shows normal sinus rhythm, nonspecific ST and T-wave changes.  IMPRESSION AND PLAN:  This as a 40 year old female admitted for hypokalemia to the Medicine Service and also has problems  with agitation, psychosis.  1. Hypokalemia.  The etiology of the hyperkalemia could be related to     the diarrhea that the patient has been having.  The patient     received 40 mEq p.o. KCl in the Emergency Department.  I will give     the patient another 60 mEq p.o. potassium and I will also start the     patient on IV fluids of potassium and we will check the potassium     level in the morning  Also please note the diarrhea has resolved. 2. History of schizophrenia with acute psychosis.  This most likely     secondary to not taking her medications.  I have already consulted     the Psychiatric Service to see the patient.  For the time being, I     will place the patient on Valium 10 mg p.o. q.8 h. p.r.n. anxiety,     agitation.  We will go from there. 3. DVT prophylaxis.  The patient will get Lovenox for DVT prophylaxis.     The patient is a full code and she will also have a one-to-one     sitter.  She is involuntarily committed.     Rock Nephew, MD     NH/MEDQ  D:  09/07/2010  T:  09/07/2010  Job:  [id number]  cc:   [facility]  Electronically Signed by Rock Nephew MD on 09/29/2010 11:27:04 PM

## 2010-10-28 ENCOUNTER — Inpatient Hospital Stay (HOSPITAL_COMMUNITY)
Admission: EM | Admit: 2010-10-28 | Discharge: 2010-11-13 | DRG: 299 | Disposition: A | Discharge status: Home / Self Care | Payer: Medicaid Other | Attending: Internal Medicine | Admitting: Internal Medicine

## 2010-10-28 DIAGNOSIS — D6859 Other primary thrombophilia: Secondary | ICD-10-CM | POA: Diagnosis present

## 2010-10-28 DIAGNOSIS — R112 Nausea with vomiting, unspecified: Secondary | ICD-10-CM | POA: Diagnosis present

## 2010-10-28 DIAGNOSIS — F411 Generalized anxiety disorder: Secondary | ICD-10-CM | POA: Diagnosis not present

## 2010-10-28 DIAGNOSIS — N2889 Other specified disorders of kidney and ureter: Secondary | ICD-10-CM | POA: Diagnosis present

## 2010-10-28 DIAGNOSIS — I7300 Raynaud's syndrome without gangrene: Secondary | ICD-10-CM | POA: Diagnosis present

## 2010-10-28 DIAGNOSIS — E876 Hypokalemia: Secondary | ICD-10-CM | POA: Diagnosis not present

## 2010-10-28 DIAGNOSIS — N390 Urinary tract infection, site not specified: Secondary | ICD-10-CM | POA: Diagnosis present

## 2010-10-28 DIAGNOSIS — D72829 Elevated white blood cell count, unspecified: Secondary | ICD-10-CM | POA: Diagnosis present

## 2010-10-28 DIAGNOSIS — F4310 Post-traumatic stress disorder, unspecified: Secondary | ICD-10-CM | POA: Diagnosis present

## 2010-10-28 DIAGNOSIS — I7411 Embolism and thrombosis of thoracic aorta: Principal | ICD-10-CM | POA: Diagnosis present

## 2010-10-28 DIAGNOSIS — I748 Embolism and thrombosis of other arteries: Secondary | ICD-10-CM | POA: Diagnosis present

## 2010-10-28 DIAGNOSIS — F1210 Cannabis abuse, uncomplicated: Secondary | ICD-10-CM | POA: Diagnosis present

## 2010-10-28 DIAGNOSIS — R1013 Epigastric pain: Secondary | ICD-10-CM | POA: Diagnosis not present

## 2010-10-28 DIAGNOSIS — E7800 Pure hypercholesterolemia, unspecified: Secondary | ICD-10-CM | POA: Diagnosis present

## 2010-10-28 DIAGNOSIS — R197 Diarrhea, unspecified: Secondary | ICD-10-CM | POA: Diagnosis present

## 2010-10-28 DIAGNOSIS — N179 Acute kidney failure, unspecified: Secondary | ICD-10-CM | POA: Diagnosis not present

## 2010-10-28 DIAGNOSIS — R000 Tachycardia, unspecified: Secondary | ICD-10-CM | POA: Diagnosis present

## 2010-10-28 DIAGNOSIS — G5790 Unspecified mononeuropathy of unspecified lower limb: Secondary | ICD-10-CM | POA: Diagnosis present

## 2010-10-28 DIAGNOSIS — E669 Obesity, unspecified: Secondary | ICD-10-CM | POA: Diagnosis present

## 2010-10-28 DIAGNOSIS — F17200 Nicotine dependence, unspecified, uncomplicated: Secondary | ICD-10-CM | POA: Diagnosis present

## 2010-10-28 DIAGNOSIS — R0609 Other forms of dyspnea: Secondary | ICD-10-CM | POA: Diagnosis present

## 2010-10-28 DIAGNOSIS — K029 Dental caries, unspecified: Secondary | ICD-10-CM | POA: Diagnosis present

## 2010-10-28 DIAGNOSIS — R0989 Other specified symptoms and signs involving the circulatory and respiratory systems: Secondary | ICD-10-CM | POA: Diagnosis present

## 2010-10-28 DIAGNOSIS — F259 Schizoaffective disorder, unspecified: Secondary | ICD-10-CM | POA: Diagnosis present

## 2010-10-28 DIAGNOSIS — K763 Infarction of liver: Secondary | ICD-10-CM | POA: Diagnosis present

## 2010-10-28 DIAGNOSIS — I959 Hypotension, unspecified: Secondary | ICD-10-CM | POA: Diagnosis not present

## 2010-10-29 ENCOUNTER — Emergency Department (HOSPITAL_COMMUNITY): Payer: Medicaid Other

## 2010-10-29 ENCOUNTER — Encounter (HOSPITAL_COMMUNITY): Payer: Self-pay | Admitting: Radiology

## 2010-10-29 LAB — CBC
HCT: 45.2 % (ref 36.0–46.0)
MCV: 91.9 fL (ref 78.0–100.0)
RBC: 4.92 MIL/uL (ref 3.87–5.11)
RDW: 14.8 % (ref 11.5–15.5)
WBC: 22.1 10*3/uL — ABNORMAL HIGH (ref 4.0–10.5)

## 2010-10-29 LAB — DIFFERENTIAL
Eosinophils Relative: 0 % (ref 0–5)
Lymphocytes Relative: 9 % — ABNORMAL LOW (ref 12–46)
Lymphs Abs: 1.9 10*3/uL (ref 0.7–4.0)
Monocytes Relative: 8 % (ref 3–12)

## 2010-10-29 LAB — POCT I-STAT, CHEM 8
BUN: 7 mg/dL (ref 6–23)
Chloride: 99 mEq/L (ref 96–112)
Glucose, Bld: 140 mg/dL — ABNORMAL HIGH (ref 70–99)
HCT: 48 % — ABNORMAL HIGH (ref 36.0–46.0)
Potassium: 4 mEq/L (ref 3.5–5.1)

## 2010-10-29 LAB — URINALYSIS, ROUTINE W REFLEX MICROSCOPIC
Glucose, UA: NEGATIVE mg/dL
Hgb urine dipstick: NEGATIVE
Specific Gravity, Urine: 1.022 (ref 1.005–1.030)

## 2010-10-29 LAB — URINE MICROSCOPIC-ADD ON

## 2010-10-29 LAB — HEPARIN LEVEL (UNFRACTIONATED): Heparin Unfractionated: 0.13 IU/mL — ABNORMAL LOW (ref 0.30–0.70)

## 2010-10-29 LAB — PROTIME-INR: Prothrombin Time: 14.4 seconds (ref 11.6–15.2)

## 2010-10-29 LAB — HOMOCYSTEINE: Homocysteine: 11.5 umol/L (ref 4.0–15.4)

## 2010-10-29 MED ORDER — IOHEXOL 350 MG/ML SOLN
80.0000 mL | Freq: Once | INTRAVENOUS | Status: AC | PRN
  Administered 2010-10-29: 80 mL via INTRAVENOUS

## 2010-10-29 MED ORDER — IOHEXOL 300 MG/ML  SOLN
100.0000 mL | Freq: Once | INTRAMUSCULAR | Status: AC | PRN
  Administered 2010-10-29: 100 mL via INTRAVENOUS

## 2010-10-30 DIAGNOSIS — R0989 Other specified symptoms and signs involving the circulatory and respiratory systems: Secondary | ICD-10-CM

## 2010-10-30 DIAGNOSIS — I471 Supraventricular tachycardia: Secondary | ICD-10-CM

## 2010-10-30 DIAGNOSIS — M79609 Pain in unspecified limb: Secondary | ICD-10-CM

## 2010-10-30 LAB — PROTIME-INR
INR: 1.03 (ref 0.00–1.49)
Prothrombin Time: 13.7 seconds (ref 11.6–15.2)

## 2010-10-30 LAB — CBC
Hemoglobin: 13.4 g/dL (ref 12.0–15.0)
RBC: 4.18 MIL/uL (ref 3.87–5.11)
WBC: 19.5 10*3/uL — ABNORMAL HIGH (ref 4.0–10.5)

## 2010-10-30 LAB — CARDIAC PANEL(CRET KIN+CKTOT+MB+TROPI)
CK, MB: 3.3 ng/mL (ref 0.3–4.0)
CK, MB: 2.1 ng/mL (ref 0.3–4.0)
Relative Index: INVALID (ref 0.0–2.5)
Total CK: 32 U/L (ref 7–177)
Total CK: 31 U/L (ref 7–177)
Troponin I: <0.3 ng/mL (ref <0.30)
Troponin I: <0.3 ng/mL (ref <0.30)

## 2010-10-30 LAB — LUPUS ANTICOAGULANT PANEL
PTT Lupus Anticoagulant: 62.5 secs — ABNORMAL HIGH (ref 30.0–45.6)
PTTLA 4:1 Mix: 57.5 secs — ABNORMAL HIGH (ref 30.0–45.6)
PTTLA Confirmation: 11.3 secs — ABNORMAL HIGH (ref <8.0)
dRVVT Incubated 1:1 Mix: 41.2 secs (ref 36.2–44.3)

## 2010-10-30 LAB — BASIC METABOLIC PANEL
Calcium: 9.1 mg/dL (ref 8.4–10.5)
Creatinine, Ser: 1.41 mg/dL — ABNORMAL HIGH (ref 0.50–1.10)
GFR calc Af Amer: 50 mL/min — ABNORMAL LOW (ref >60)

## 2010-10-30 LAB — PROTEIN C, TOTAL: Protein C, Total: 106 % (ref 72–160)

## 2010-10-30 LAB — HEPARIN LEVEL (UNFRACTIONATED): Heparin Unfractionated: 0.43 IU/mL (ref 0.30–0.70)

## 2010-10-31 ENCOUNTER — Inpatient Hospital Stay (HOSPITAL_COMMUNITY): Payer: Medicaid Other

## 2010-10-31 DIAGNOSIS — D6859 Other primary thrombophilia: Secondary | ICD-10-CM

## 2010-10-31 DIAGNOSIS — I517 Cardiomegaly: Secondary | ICD-10-CM

## 2010-10-31 LAB — CBC
Hemoglobin: 11.2 g/dL — ABNORMAL LOW (ref 12.0–15.0)
MCH: 31.1 pg (ref 26.0–34.0)
MCHC: 33.1 g/dL (ref 30.0–36.0)
Platelets: 303 10*3/uL (ref 150–400)
RDW: 15 % (ref 11.5–15.5)

## 2010-10-31 LAB — BASIC METABOLIC PANEL
BUN: 6 mg/dL (ref 6–23)
Calcium: 8.7 mg/dL (ref 8.4–10.5)
Creatinine, Ser: 1.34 mg/dL — ABNORMAL HIGH (ref 0.50–1.10)
GFR calc Af Amer: 53 mL/min — ABNORMAL LOW (ref >60)
GFR calc non Af Amer: 44 mL/min — ABNORMAL LOW (ref >60)
Glucose, Bld: 100 mg/dL — ABNORMAL HIGH (ref 70–99)

## 2010-10-31 LAB — LIPID PANEL: LDL Cholesterol: 92 mg/dL (ref 0–99)

## 2010-10-31 LAB — PROTIME-INR: Prothrombin Time: 26.6 seconds — ABNORMAL HIGH (ref 11.6–15.2)

## 2010-10-31 LAB — HEPARIN LEVEL (UNFRACTIONATED): Heparin Unfractionated: 0.28 IU/mL — ABNORMAL LOW (ref 0.30–0.70)

## 2010-11-01 ENCOUNTER — Inpatient Hospital Stay (HOSPITAL_COMMUNITY): Payer: Medicaid Other

## 2010-11-01 ENCOUNTER — Encounter (HOSPITAL_COMMUNITY): Payer: Self-pay | Admitting: Radiology

## 2010-11-01 LAB — BASIC METABOLIC PANEL
CO2: 25 mEq/L (ref 19–32)
Chloride: 102 mEq/L (ref 96–112)
Creatinine, Ser: 1.19 mg/dL — ABNORMAL HIGH (ref 0.50–1.10)

## 2010-11-01 LAB — CBC
Hemoglobin: 10.6 g/dL — ABNORMAL LOW (ref 12.0–15.0)
MCH: 31 pg (ref 26.0–34.0)
Platelets: 303 10*3/uL (ref 150–400)
RBC: 3.42 MIL/uL — ABNORMAL LOW (ref 3.87–5.11)

## 2010-11-01 LAB — ANA: Anti Nuclear Antibody(ANA): NEGATIVE

## 2010-11-01 LAB — HEPARIN LEVEL (UNFRACTIONATED)
Heparin Unfractionated: 0.3 IU/mL (ref 0.30–0.70)
Heparin Unfractionated: 0.24 IU/mL — ABNORMAL LOW (ref 0.30–0.70)

## 2010-11-01 LAB — PROTIME-INR: INR: 3.23 — ABNORMAL HIGH (ref 0.00–1.49)

## 2010-11-01 LAB — RHEUMATOID FACTOR: Rhuematoid fact SerPl-aCnc: <10 IU/mL (ref <=14)

## 2010-11-01 LAB — C-REACTIVE PROTEIN: CRP: 47.2 mg/dL — ABNORMAL HIGH (ref <0.6)

## 2010-11-01 LAB — FACTOR 5 LEIDEN

## 2010-11-01 MED ORDER — IOHEXOL 300 MG/ML  SOLN
70.0000 mL | Freq: Once | INTRAMUSCULAR | Status: AC | PRN
  Administered 2010-11-01: 70 mL via INTRAVENOUS

## 2010-11-02 ENCOUNTER — Inpatient Hospital Stay (HOSPITAL_COMMUNITY): Payer: Medicaid Other

## 2010-11-02 DIAGNOSIS — I7411 Embolism and thrombosis of thoracic aorta: Secondary | ICD-10-CM

## 2010-11-02 LAB — PROTEIN ELECTROPH W RFLX QUANT IMMUNOGLOBULINS
Alpha-1-Globulin: 10.5 % — ABNORMAL HIGH (ref 2.9–4.9)
Alpha-2-Globulin: 20.1 % — ABNORMAL HIGH (ref 7.1–11.8)
Beta 2: 8.2 % — ABNORMAL HIGH (ref 3.2–6.5)
Beta Globulin: 5.3 % (ref 4.7–7.2)
Gamma Globulin: 12.8 % (ref 11.1–18.8)

## 2010-11-02 LAB — CBC
MCHC: 33.7 g/dL (ref 30.0–36.0)
Platelets: 354 10*3/uL (ref 150–400)
RDW: 15 % (ref 11.5–15.5)
WBC: 18.8 10*3/uL — ABNORMAL HIGH (ref 4.0–10.5)

## 2010-11-02 LAB — BETA-2-GLYCOPROTEIN I ABS, IGG/M/A: Beta-2 Glyco I IgG: 0 G Units (ref <20)

## 2010-11-02 LAB — BASIC METABOLIC PANEL
Calcium: 9.4 mg/dL (ref 8.4–10.5)
GFR calc Af Amer: 52 mL/min — ABNORMAL LOW (ref >60)
GFR calc non Af Amer: 43 mL/min — ABNORMAL LOW (ref >60)
Potassium: 3.4 mEq/L — ABNORMAL LOW (ref 3.5–5.1)
Sodium: 135 mEq/L (ref 135–145)

## 2010-11-02 LAB — PROTIME-INR
INR: 2.22 — ABNORMAL HIGH (ref 0.00–1.49)
Prothrombin Time: 25 seconds — ABNORMAL HIGH (ref 11.6–15.2)

## 2010-11-02 LAB — CARDIOLIPIN ANTIBODIES, IGG, IGM, IGA
Anticardiolipin IgA: 4 APL U/mL — ABNORMAL LOW (ref <22)
Anticardiolipin IgG: 2 GPL U/mL — ABNORMAL LOW (ref <23)
Anticardiolipin IgM: 5 MPL U/mL — ABNORMAL LOW (ref <11)

## 2010-11-02 LAB — PRO B NATRIURETIC PEPTIDE: Pro B Natriuretic peptide (BNP): 1203 pg/mL — ABNORMAL HIGH (ref 0–125)

## 2010-11-03 ENCOUNTER — Inpatient Hospital Stay (HOSPITAL_COMMUNITY): Payer: Medicaid Other

## 2010-11-03 DIAGNOSIS — F200 Paranoid schizophrenia: Secondary | ICD-10-CM

## 2010-11-03 DIAGNOSIS — D6859 Other primary thrombophilia: Secondary | ICD-10-CM

## 2010-11-03 LAB — CBC
MCV: 92.7 fL (ref 78.0–100.0)
Platelets: 415 10*3/uL — ABNORMAL HIGH (ref 150–400)
RDW: 14.8 % (ref 11.5–15.5)
WBC: 19.7 10*3/uL — ABNORMAL HIGH (ref 4.0–10.5)

## 2010-11-03 LAB — BETA-2-GLYCOPROTEIN I ABS, IGG/M/A
Beta-2 Glyco I IgG: 0 G Units (ref <20)
Beta-2-Glycoprotein I IgM: 5 M Units (ref <20)

## 2010-11-03 LAB — HEPATIC FUNCTION PANEL
ALT: 158 U/L — ABNORMAL HIGH (ref 0–35)
AST: 241 U/L — ABNORMAL HIGH (ref 0–37)
Albumin: 2.6 g/dL — ABNORMAL LOW (ref 3.5–5.2)
Bilirubin, Direct: 0.1 mg/dL (ref 0.0–0.3)
Total Bilirubin: 0.3 mg/dL (ref 0.3–1.2)

## 2010-11-03 LAB — BASIC METABOLIC PANEL
BUN: 8 mg/dL (ref 6–23)
CO2: 32 mEq/L (ref 19–32)
Chloride: 93 mEq/L — ABNORMAL LOW (ref 96–112)
Creatinine, Ser: 1.34 mg/dL — ABNORMAL HIGH (ref 0.50–1.10)
GFR calc Af Amer: 53 mL/min — ABNORMAL LOW (ref >60)

## 2010-11-03 LAB — URINALYSIS, ROUTINE W REFLEX MICROSCOPIC
Bilirubin Urine: NEGATIVE
Glucose, UA: NEGATIVE mg/dL
Protein, ur: NEGATIVE mg/dL
Urobilinogen, UA: 0.2 mg/dL (ref 0.0–1.0)

## 2010-11-03 LAB — URINE MICROSCOPIC-ADD ON

## 2010-11-03 LAB — PROTIME-INR: INR: 2.59 — ABNORMAL HIGH (ref 0.00–1.49)

## 2010-11-03 LAB — CARDIOLIPIN ANTIBODIES, IGG, IGM, IGA: Anticardiolipin IgA: 11 APL U/mL — ABNORMAL LOW (ref <22)

## 2010-11-04 ENCOUNTER — Inpatient Hospital Stay (HOSPITAL_COMMUNITY): Payer: Medicaid Other

## 2010-11-04 LAB — CBC
Hemoglobin: 11.1 g/dL — ABNORMAL LOW (ref 12.0–15.0)
MCH: 32 pg (ref 26.0–34.0)
Platelets: 426 10*3/uL — ABNORMAL HIGH (ref 150–400)
RBC: 3.47 MIL/uL — ABNORMAL LOW (ref 3.87–5.11)
WBC: 18.3 10*3/uL — ABNORMAL HIGH (ref 4.0–10.5)

## 2010-11-04 LAB — BASIC METABOLIC PANEL
BUN: 7 mg/dL (ref 6–23)
CO2: 33 mEq/L — ABNORMAL HIGH (ref 19–32)
Chloride: 94 mEq/L — ABNORMAL LOW (ref 96–112)
GFR calc non Af Amer: 45 mL/min — ABNORMAL LOW (ref >60)
Glucose, Bld: 100 mg/dL — ABNORMAL HIGH (ref 70–99)
Potassium: 3.6 mEq/L (ref 3.5–5.1)
Sodium: 138 mEq/L (ref 135–145)

## 2010-11-04 LAB — HEPATIC FUNCTION PANEL
AST: 185 U/L — ABNORMAL HIGH (ref 0–37)
Albumin: 2.5 g/dL — ABNORMAL LOW (ref 3.5–5.2)
Total Bilirubin: 0.5 mg/dL (ref 0.3–1.2)
Total Protein: 7.3 g/dL (ref 6.0–8.3)

## 2010-11-04 LAB — CULTURE, BLOOD (ROUTINE X 2)
Culture  Setup Time: 201207291758
Culture: NO GROWTH

## 2010-11-04 LAB — URINE CULTURE
Colony Count: NO GROWTH
Culture  Setup Time: 201208030055
Culture: NO GROWTH
Special Requests: NEGATIVE

## 2010-11-04 LAB — DIFFERENTIAL
Basophils Relative: 0 % (ref 0–1)
Eosinophils Absolute: 0.4 10*3/uL (ref 0.0–0.7)
Lymphs Abs: 1.8 10*3/uL (ref 0.7–4.0)
Monocytes Relative: 13 % — ABNORMAL HIGH (ref 3–12)
Neutro Abs: 13.6 10*3/uL — ABNORMAL HIGH (ref 1.7–7.7)
Neutrophils Relative %: 75 % (ref 43–77)

## 2010-11-04 LAB — PROTIME-INR: Prothrombin Time: 42.7 seconds — ABNORMAL HIGH (ref 11.6–15.2)

## 2010-11-04 LAB — C4 COMPLEMENT: Complement C4, Body Fluid: 38 mg/dL (ref 10–40)

## 2010-11-05 DIAGNOSIS — F200 Paranoid schizophrenia: Secondary | ICD-10-CM

## 2010-11-05 LAB — CBC
HCT: 32.4 % — ABNORMAL LOW (ref 36.0–46.0)
Hemoglobin: 10.8 g/dL — ABNORMAL LOW (ref 12.0–15.0)
MCH: 30.9 pg (ref 26.0–34.0)
MCHC: 33.3 g/dL (ref 30.0–36.0)
MCV: 92.8 fL (ref 78.0–100.0)
Platelets: 474 10*3/uL — ABNORMAL HIGH (ref 150–400)
RBC: 3.49 MIL/uL — ABNORMAL LOW (ref 3.87–5.11)
RDW: 15 % (ref 11.5–15.5)
WBC: 20.8 10*3/uL — ABNORMAL HIGH (ref 4.0–10.5)

## 2010-11-05 LAB — COMPREHENSIVE METABOLIC PANEL
ALT: 111 U/L — ABNORMAL HIGH (ref 0–35)
Alkaline Phosphatase: 238 U/L — ABNORMAL HIGH (ref 39–117)
BUN: 10 mg/dL (ref 6–23)
CO2: 32 mEq/L (ref 19–32)
Calcium: 9.2 mg/dL (ref 8.4–10.5)
GFR calc Af Amer: 58 mL/min — ABNORMAL LOW (ref >60)
GFR calc non Af Amer: 48 mL/min — ABNORMAL LOW (ref >60)
Glucose, Bld: 82 mg/dL (ref 70–99)
Sodium: 136 mEq/L (ref 135–145)

## 2010-11-05 LAB — CLOSTRIDIUM DIFFICILE BY PCR: Toxigenic C. Difficile by PCR: NEGATIVE

## 2010-11-05 LAB — PROTIME-INR
INR: 4.67 — ABNORMAL HIGH (ref 0.00–1.49)
Prothrombin Time: 44.7 seconds — ABNORMAL HIGH (ref 11.6–15.2)

## 2010-11-05 LAB — LIPASE, BLOOD: Lipase: 53 U/L (ref 11–59)

## 2010-11-05 LAB — HIV ANTIBODY (ROUTINE TESTING W REFLEX): HIV: NONREACTIVE

## 2010-11-06 ENCOUNTER — Inpatient Hospital Stay (HOSPITAL_COMMUNITY): Payer: Medicaid Other

## 2010-11-06 ENCOUNTER — Encounter (HOSPITAL_COMMUNITY): Payer: Self-pay | Admitting: Radiology

## 2010-11-06 DIAGNOSIS — R197 Diarrhea, unspecified: Secondary | ICD-10-CM

## 2010-11-06 DIAGNOSIS — D72829 Elevated white blood cell count, unspecified: Secondary | ICD-10-CM

## 2010-11-06 LAB — CBC
HCT: 35.9 % — ABNORMAL LOW (ref 36.0–46.0)
Hemoglobin: 11.9 g/dL — ABNORMAL LOW (ref 12.0–15.0)
MCH: 30.9 pg (ref 26.0–34.0)
MCHC: 33.1 g/dL (ref 30.0–36.0)
RDW: 14.9 % (ref 11.5–15.5)

## 2010-11-06 LAB — ANTI-SMITH ANTIBODY: ENA SM Ab Ser-aCnc: 1 AU/mL (ref <30)

## 2010-11-06 LAB — SJOGRENS SYNDROME-B EXTRACTABLE NUCLEAR ANTIBODY: SSB (La) (ENA) Antibody, IgG: <1 AU/mL (ref <30)

## 2010-11-06 LAB — COMPREHENSIVE METABOLIC PANEL
BUN: 10 mg/dL (ref 6–23)
CO2: 30 mEq/L (ref 19–32)
Chloride: 93 mEq/L — ABNORMAL LOW (ref 96–112)
Creatinine, Ser: 1.03 mg/dL (ref 0.50–1.10)
GFR calc Af Amer: >60 mL/min (ref >60)
GFR calc non Af Amer: 59 mL/min — ABNORMAL LOW (ref >60)
Total Bilirubin: 0.3 mg/dL (ref 0.3–1.2)

## 2010-11-06 LAB — SJOGRENS SYNDROME-A EXTRACTABLE NUCLEAR ANTIBODY: SSA (Ro) (ENA) Antibody, IgG: 1 AU/mL (ref <30)

## 2010-11-06 MED ORDER — IOHEXOL 300 MG/ML  SOLN
75.0000 mL | Freq: Once | INTRAMUSCULAR | Status: AC | PRN
  Administered 2010-11-06: 75 mL via INTRAVENOUS

## 2010-11-07 DIAGNOSIS — F200 Paranoid schizophrenia: Secondary | ICD-10-CM

## 2010-11-07 LAB — COMPREHENSIVE METABOLIC PANEL
Albumin: 2.6 g/dL — ABNORMAL LOW (ref 3.5–5.2)
BUN: 12 mg/dL (ref 6–23)
Calcium: 9.6 mg/dL (ref 8.4–10.5)
GFR calc Af Amer: >60 mL/min (ref >60)
Glucose, Bld: 93 mg/dL (ref 70–99)
Potassium: 3.7 mEq/L (ref 3.5–5.1)
Total Protein: 7.7 g/dL (ref 6.0–8.3)

## 2010-11-07 LAB — CBC
MCH: 31.6 pg (ref 26.0–34.0)
MCV: 94.1 fL (ref 78.0–100.0)
Platelets: 640 10*3/uL — ABNORMAL HIGH (ref 150–400)
RBC: 3.76 MIL/uL — ABNORMAL LOW (ref 3.87–5.11)
RDW: 15.1 % (ref 11.5–15.5)
WBC: 20.5 10*3/uL — ABNORMAL HIGH (ref 4.0–10.5)

## 2010-11-07 LAB — HIV ANTIBODY (ROUTINE TESTING W REFLEX): HIV: NONREACTIVE

## 2010-11-07 LAB — PROTIME-INR: Prothrombin Time: 33.7 seconds — ABNORMAL HIGH (ref 11.6–15.2)

## 2010-11-07 LAB — TECHNOLOGIST SMEAR REVIEW

## 2010-11-08 DIAGNOSIS — I38 Endocarditis, valve unspecified: Secondary | ICD-10-CM

## 2010-11-08 LAB — DIFFERENTIAL
Basophils Absolute: 0.2 10*3/uL — ABNORMAL HIGH (ref 0.0–0.1)
Eosinophils Absolute: 0.7 10*3/uL (ref 0.0–0.7)
Lymphs Abs: 3.1 10*3/uL (ref 0.7–4.0)
Monocytes Absolute: 2.1 10*3/uL — ABNORMAL HIGH (ref 0.1–1.0)
Neutrophils Relative %: 74 % (ref 43–77)

## 2010-11-08 LAB — BASIC METABOLIC PANEL
CO2: 26 mEq/L (ref 19–32)
Calcium: 9.3 mg/dL (ref 8.4–10.5)
Creatinine, Ser: 1.09 mg/dL (ref 0.50–1.10)
GFR calc non Af Amer: 55 mL/min — ABNORMAL LOW (ref >60)

## 2010-11-08 LAB — PROCALCITONIN: Procalcitonin: 0.54 ng/mL

## 2010-11-08 LAB — ANTI-NEUTROPHIL ANTIBODY

## 2010-11-08 LAB — CBC
MCH: 31 pg (ref 26.0–34.0)
MCHC: 33.1 g/dL (ref 30.0–36.0)
MCV: 93.7 fL (ref 78.0–100.0)
Platelets: 680 10*3/uL — ABNORMAL HIGH (ref 150–400)
RBC: 3.35 MIL/uL — ABNORMAL LOW (ref 3.87–5.11)
RDW: 15.2 % (ref 11.5–15.5)

## 2010-11-08 LAB — PROTIME-INR
INR: 2.19 — ABNORMAL HIGH (ref 0.00–1.49)
Prothrombin Time: 24.7 seconds — ABNORMAL HIGH (ref 11.6–15.2)

## 2010-11-08 LAB — FERRITIN: Ferritin: 718 ng/mL — ABNORMAL HIGH (ref 10–291)

## 2010-11-09 DIAGNOSIS — I7411 Embolism and thrombosis of thoracic aorta: Secondary | ICD-10-CM

## 2010-11-09 DIAGNOSIS — R197 Diarrhea, unspecified: Secondary | ICD-10-CM

## 2010-11-09 DIAGNOSIS — D72829 Elevated white blood cell count, unspecified: Secondary | ICD-10-CM

## 2010-11-09 LAB — CBC
HCT: 32.3 % — ABNORMAL LOW (ref 36.0–46.0)
Hemoglobin: 10.8 g/dL — ABNORMAL LOW (ref 12.0–15.0)
WBC: 23.5 10*3/uL — ABNORMAL HIGH (ref 4.0–10.5)

## 2010-11-09 LAB — PROTIME-INR: INR: 1.43 (ref 0.00–1.49)

## 2010-11-09 LAB — BASIC METABOLIC PANEL
CO2: 26 mEq/L (ref 19–32)
Chloride: 100 mEq/L (ref 96–112)
Sodium: 137 mEq/L (ref 135–145)

## 2010-11-09 NOTE — Consult Note (Signed)
  NAMEMAXYNE, [name]                  ACCOUNT NO.:  [id number]  MEDICAL RECORD NO.:  [id number]  LOCATION:                                 FACILITY:  PHYSICIAN:  Eulogio Ditch, MD DATE OF BIRTH:  1970/07/02  DATE OF CONSULTATION:  11/03/2010 DATE OF DISCHARGE:                                CONSULTATION   REASON FOR CONSULTATION:  Increased anxiety.  HISTORY OF PRESENT ILLNESS:  A 40 year old female with history of schizophrenia and marijuana abuse who was admitted on the medical floor because of the severe abdominal pain along with intractable nausea and vomiting.  The patient is currently on fluphenazine 25 mg IM every 2 weeks, Cogentin 1 mg b.i.d., Neurontin 400 mg t.i.d. along with Valium 5 mg q.8 h.  The patient was recently admitted to [facility] om September 28, 2010, and was discharged on Zoloft 50 mg daily, but the patient told me that she do not like this medication as she do not want to be continued on this medication.  The patient reported that she is taking her medications regularly and she follows up at [facility].  The patient was asking to increase Valium to 10 mg 3 times a day, psychoeducation given to the patient because of her history of drug abuse.  She should not take too much of Valium.  I discussed with her on the calming techniques and I told her, her anxiety is also increased because of her abdominal pain and other medical issues, so at this time there is no need to increase Valium.  The patient is logical and goal directed during the interview, not suicidal or homicidal, not delusional, not internally preoccupied.  Denies hearing voices.  The patient is alert, awake, and oriented x3.  Insight and judgment fair.  The patient is divorced, living with the boyfriend, he is on disability, unemployed.  DIAGNOSTIC IMPRESSION:  Axis I:  Chronic schizo-affective affective disorder, anxiety disorder not otherwise specified, history of marijuana abuse. Axis  II:  Deferred. Axis III:  See medical notes. Axis IV:  Chronic mental health issues. Axis: V:  50.  RECOMMENDATIONS:  At this time, the patient can be continued on her current combination of medications.  I will follow up on this patient as needed.     Eulogio Ditch, MD     SA/MEDQ  D:  11/03/2010  T:  11/03/2010  Job:  [id number]  Electronically Signed by Eulogio Ditch  on 11/09/2010 08:57:22 AM

## 2010-11-10 DIAGNOSIS — F200 Paranoid schizophrenia: Secondary | ICD-10-CM

## 2010-11-10 LAB — BASIC METABOLIC PANEL
CO2: 27 mEq/L (ref 19–32)
Chloride: 101 mEq/L (ref 96–112)
Sodium: 139 mEq/L (ref 135–145)

## 2010-11-10 LAB — PROTIME-INR: INR: 1.45 (ref 0.00–1.49)

## 2010-11-10 LAB — CBC
MCHC: 33.2 g/dL (ref 30.0–36.0)
RDW: 15.2 % (ref 11.5–15.5)

## 2010-11-11 LAB — CBC
Hemoglobin: 10.3 g/dL — ABNORMAL LOW (ref 12.0–15.0)
MCH: 31.8 pg (ref 26.0–34.0)
MCV: 95.1 fL (ref 78.0–100.0)
RBC: 3.24 MIL/uL — ABNORMAL LOW (ref 3.87–5.11)

## 2010-11-11 LAB — PROTIME-INR: Prothrombin Time: 18.6 seconds — ABNORMAL HIGH (ref 11.6–15.2)

## 2010-11-12 LAB — BASIC METABOLIC PANEL
CO2: 27 mEq/L (ref 19–32)
Chloride: 105 mEq/L (ref 96–112)
Creatinine, Ser: 1.04 mg/dL (ref 0.50–1.10)
Sodium: 140 mEq/L (ref 135–145)

## 2010-11-12 LAB — CBC
HCT: 32.3 % — ABNORMAL LOW (ref 36.0–46.0)
Hemoglobin: 10.7 g/dL — ABNORMAL LOW (ref 12.0–15.0)
MCHC: 33.1 g/dL (ref 30.0–36.0)

## 2010-11-12 LAB — PROTIME-INR: INR: 1.7 — ABNORMAL HIGH (ref 0.00–1.49)

## 2010-11-13 ENCOUNTER — Encounter: Payer: Self-pay | Admitting: Licensed Clinical Social Worker

## 2010-11-13 DIAGNOSIS — E876 Hypokalemia: Secondary | ICD-10-CM | POA: Insufficient documentation

## 2010-11-13 DIAGNOSIS — R0902 Hypoxemia: Secondary | ICD-10-CM | POA: Insufficient documentation

## 2010-11-13 DIAGNOSIS — F250 Schizoaffective disorder, bipolar type: Secondary | ICD-10-CM

## 2010-11-13 DIAGNOSIS — F1210 Cannabis abuse, uncomplicated: Secondary | ICD-10-CM | POA: Insufficient documentation

## 2010-11-13 DIAGNOSIS — I7300 Raynaud's syndrome without gangrene: Secondary | ICD-10-CM | POA: Insufficient documentation

## 2010-11-13 DIAGNOSIS — N390 Urinary tract infection, site not specified: Secondary | ICD-10-CM | POA: Insufficient documentation

## 2010-11-13 DIAGNOSIS — Z720 Tobacco use: Secondary | ICD-10-CM | POA: Insufficient documentation

## 2010-11-13 DIAGNOSIS — R000 Tachycardia, unspecified: Secondary | ICD-10-CM | POA: Insufficient documentation

## 2010-11-13 DIAGNOSIS — F209 Schizophrenia, unspecified: Secondary | ICD-10-CM | POA: Insufficient documentation

## 2010-11-13 DIAGNOSIS — D72829 Elevated white blood cell count, unspecified: Secondary | ICD-10-CM | POA: Insufficient documentation

## 2010-11-13 DIAGNOSIS — F4310 Post-traumatic stress disorder, unspecified: Secondary | ICD-10-CM | POA: Insufficient documentation

## 2010-11-13 LAB — VANCOMYCIN, TROUGH: Vancomycin Tr: 31 ug/mL (ref 10.0–20.0)

## 2010-11-13 LAB — CBC
HCT: 30.5 % — ABNORMAL LOW (ref 36.0–46.0)
Hemoglobin: 9.8 g/dL — ABNORMAL LOW (ref 12.0–15.0)
RBC: 3.21 MIL/uL — ABNORMAL LOW (ref 3.87–5.11)
WBC: 15.1 10*3/uL — ABNORMAL HIGH (ref 4.0–10.5)

## 2010-11-13 LAB — BASIC METABOLIC PANEL
BUN: 8 mg/dL (ref 6–23)
Chloride: 104 mEq/L (ref 96–112)
Glucose, Bld: 146 mg/dL — ABNORMAL HIGH (ref 70–99)
Potassium: 3.3 mEq/L — ABNORMAL LOW (ref 3.5–5.1)

## 2010-11-13 LAB — CULTURE, BLOOD (ROUTINE X 2)
Culture  Setup Time: 201208072144
Culture: NO GROWTH
Culture: NO GROWTH

## 2010-11-13 LAB — PROTIME-INR: INR: 2.07 — ABNORMAL HIGH (ref 0.00–1.49)

## 2010-11-13 NOTE — Consult Note (Addendum)
[name], [name]                  ACCOUNT NO.:  [id number]  MEDICAL RECORD NO.:  [id number]  LOCATION:  5508                         FACILITY:  [facility]  PHYSICIAN:  Jesse Sans. Carlton Sweaney, MD, FACCDATE OF BIRTH:  04-18-1970  DATE OF CONSULTATION:  10/30/2010 DATE OF DISCHARGE:                                CONSULTATION   PRIMARY CARDIOLOGIST:  New.  CHIEF COMPLAINT:  Abdominal pain.  REASON FOR CONSULTATION:  Aortic thrombus and nonsustained wide-complex tachycardia.  HISTORY OF PRESENT ILLNESS:  [name] is a 40 year old female with a history of schizophrenia, Raynaud disease, and longstanding history of tobacco abuse, who was admitted with abdominal pain and flank pain as well as nausea and vomiting.  White blood cell count was 22,000 on admission and CT of the abdomen demonstrated bilateral renal and splenic wedge-shaped areas of hypoattenuation.  Followup CT angio of the abdomen and pelvis demonstrated acute bilateral renal and associated splenic infarcts with right renal artery hilar branch hypodense filling defect consistent with thromboembolus as well as incompletely imaged descending thoracic aortic nonocclusive filling defect consistent with aortic thrombus.  Hypercoagulable workup is pending and she has subsequently been started on heparin and Coumadin.  There is also a concern for pulmonary embolism with slight fever, tachycardia, hemoptysis, and hypoxia with a room air sat documented down to 83% earlier this morning, 95% on 2.5 L currently.  We were asked to see her regarding this aortic thrombus as well as for wide-complex tachycardia on telemetry.  She had an episode of irregular nonsustained wide-complex tachycardia of 10 beats, followed by a regular similar-appearing morphology of 4 beats, question VT.  A 2-D echocardiogram was pending.  The patient denies any chest pain or shortness of breath, but notes maybe her heart rate has been increased with palpitations  whenever she does any activity accompanied by some dyspnea on exertion.  PAST MEDICAL HISTORY: 1. Schizophrenia diagnosed in February 2012, paranoid type. 2. Raynaud phenomenon. 3. Longstanding tobacco use as well as marijuana use. 4. Hypokalemia. 5. PTSD. 6. Panic attacks. 7. Pinched nerve affecting her right leg with flare-ups every 4-6     months. 8. Status post tubal ligation.  No prior cardiac history.  MEDICATIONS: 1. Aspirin 325 mg daily. 2. Rocephin 2 g IV q.24 h. 3. Heparin drip. 4. Potassium chloride 40 mEq x1. 5. Coumadin.  ALLERGIES:  DARVOCET.  SOCIAL HISTORY:  [name] lives with her boyfriend.  They do not have any children.  She is a long-term smoker having smoked 2 packs per day. She denies any alcohol use.  She does endorse ongoing marijuana use.  FAMILY HISTORY:  The patient is unsure of her medical history in her family.  She denies any known obvious coronary artery disease that stands out in her mind though.  REVIEW OF SYSTEMS:  Positive for weight gain over the past year, for unclear reasons.  No chest pain, shortness of breath.  Decreased energy lately.  See HPI for pertinent positives.  All other systems reviewed and otherwise negative.  LABORATORY DATA:  WBC 19.5, hemoglobin 13.4, hematocrit 39.4, platelet count 326.  Sodium 136, potassium 3.3, chloride 99, CO2 of 25, glucose 102,  BUN 6, creatinine 1.41.  TSH within normal limits.  Cardiac enzymes negative x2.  Sed rate 50, 160, and 11.5.  EKG; sinus tachycardia, 119 beats per minute with nonspecific ST-T changes.  RADIOLOGIC STUDIES: 1. CT abdomen and pelvis on October 29, 2010, demonstrated bilateral     renal and splenic wedge-shaped areas of hypoattenuation with no     excretion seen from the right kidney in delayed images.  This most     likely relates to emboli given the bilateral multiorgan nature of     these findings.  Infection and metastatic disease will be less     likely.  No  evidence for obstruction. 2. CT angio abdomen and pelvis on October 29, 2010, demonstrated acute     bilateral renal and associated splenic infarcts.  Right renal     artery hilar branch hypodense filling defect consistent with     thromboembolus.  Incompletely imaged descending thoracic aorta,     nonocclusive filling defect visualized in both CT exams from today     consistent with aortic thrombus and likely the source of the emboli     can easily explain calcification and atherosclerosis of the     bifurcation of the abdominal aorta.  PHYSICAL EXAMINATION:  VITAL SIGNS:  Temperature 100.3, pulse 118, respirations 20, blood pressure 137/87, pulse ox 95% on 2 L. GENERAL:  This is a middle-aged white female in no acute distress. HEENT:  Normocephalic, atraumatic with extraocular movements intact. Clear sclerae.  Nares are without discharge. NECK:  Supple with right low-sounding carotid bruits. HEART:  Auscultation of the heart reveals tachycardic rate, regular rhythm without murmur. LUNGS:  Clear to resuscitation bilaterally without wheezes, rales, or rhonchi. ABDOMEN:  Soft, nontender, nondistended.  Positive bowel sounds. EXTREMITIES:  Warm, dry, and with trace lower extremity edema bilaterally with intact pedal pulses. NEUROLOGIC:  She is alert and oriented x3, responds to questions appropriately, but with a flat affect.  ASSESSMENT AND PLAN:  The patient was seen and examined by Dr. Daleen Squibb and myself.  This is a 40 year old female with a history of tobacco use, Raynaud syndrome, schizophrenia with no prior cardiac history, who presents to [facility] with abdominal pain and was subsequently found to have acute renal and splenic infarcts with aortic thrombus noted on CT. 1. Acute emboli to renal/splenic distribution with aortic thrombus     noted on CTA.  We suspect there may be underlying vascular disease     with her heavy tobacco use as well as Raynaud phenomenon and      therefore recommended continuing the patient on aspirin.  We will     initiate low-dose beta-blocker as well and check a fasting lipid     panel to assess her lipid status.  Bridge with heparin to Coumadin     at this time.  We will await TTE, with possible plans for TEE to     assess her cardiac cavity.  Hypercoagulable workup is in place. 2. Tachycardia.  With her hypoxia, question if she has not backed out     of pulmonary embolism in a possible hypercoagulable state.  At this     time, hospitalist have made decision to defer CT angio, which is     appropriate given her elevation in creatinine.  We will check lower     extremity venous Dopplers and continue current management with     heparin and Coumadin. 3. Wide-complex tachycardia, nonsustained.  Initial evaluation of the  rhythm, we do feel it represents nonsustained ventricular     tachycardia, but it is unclear of the irregularity in the first     strip.  Await TTE for evaluation of her LV function and initiate     low-dose beta-blocker as respiratory status tolerates. 4. Schizophrenia.  The patient seems to have good judgment and insight     to her current condition without signs of delusion or suicidal or     homicidal ideation.  We will ask Social Work to help with eventual     discharge planning.  We do worry that this may hinder her ability     to be compliant with Coumadin and that it may be that she would do     better with different antithrombin drugs.  However, we will     continue with heparin and Coumadin for now. 5. Tobacco abuse.  The patient was extensively counseled and we will     also place the smoking cessation consult as well. 6. Right carotid bruit.  We will check carotid Dopplers.  Thank you for the opportunity to participate in the care of this interesting patient and case.  We will continue to follow with you.     Dayna Dunn, P.A.C.   ______________________________ Jesse Sans Daleen Squibb, MD,  [facility]    DD/MEDQ  D:  10/30/2010  T:  10/31/2010  Job:  [id number]  Electronically Signed by Ronie Spies  on 11/13/2010 01:22:06 PM Electronically Signed by Valera Castle MD FACC on 11/15/2010 10:39:44 AM

## 2010-11-14 ENCOUNTER — Encounter: Payer: Self-pay | Admitting: *Deleted

## 2010-11-14 LAB — CULTURE, BLOOD (ROUTINE X 2)
Culture  Setup Time: 201208081209
Culture: NO GROWTH

## 2010-11-15 LAB — CULTURE, BLOOD (ROUTINE X 2)
Culture  Setup Time: 201208090941
Culture: NO GROWTH

## 2010-11-16 LAB — CULTURE, BLOOD (ROUTINE X 2): Culture  Setup Time: 201208100939

## 2010-11-17 ENCOUNTER — Emergency Department (HOSPITAL_COMMUNITY)
Admission: EM | Admit: 2010-11-17 | Discharge: 2010-11-17 | Disposition: A | Discharge status: Home / Self Care | Payer: Medicaid Other | Attending: Emergency Medicine | Admitting: Emergency Medicine

## 2010-11-17 DIAGNOSIS — R110 Nausea: Secondary | ICD-10-CM | POA: Insufficient documentation

## 2010-11-17 DIAGNOSIS — Z8659 Personal history of other mental and behavioral disorders: Secondary | ICD-10-CM | POA: Insufficient documentation

## 2010-11-17 DIAGNOSIS — Z79899 Other long term (current) drug therapy: Secondary | ICD-10-CM | POA: Insufficient documentation

## 2010-11-17 DIAGNOSIS — D72829 Elevated white blood cell count, unspecified: Secondary | ICD-10-CM | POA: Insufficient documentation

## 2010-11-17 DIAGNOSIS — R109 Unspecified abdominal pain: Secondary | ICD-10-CM | POA: Insufficient documentation

## 2010-11-17 DIAGNOSIS — R42 Dizziness and giddiness: Secondary | ICD-10-CM | POA: Insufficient documentation

## 2010-11-17 DIAGNOSIS — I7300 Raynaud's syndrome without gangrene: Secondary | ICD-10-CM | POA: Insufficient documentation

## 2010-11-17 DIAGNOSIS — Z7901 Long term (current) use of anticoagulants: Secondary | ICD-10-CM | POA: Insufficient documentation

## 2010-11-17 DIAGNOSIS — D473 Essential (hemorrhagic) thrombocythemia: Secondary | ICD-10-CM | POA: Insufficient documentation

## 2010-11-17 LAB — DIFFERENTIAL
Basophils Absolute: 0.1 10*3/uL (ref 0.0–0.1)
Eosinophils Absolute: 0.4 10*3/uL (ref 0.0–0.7)
Lymphocytes Relative: 26 % (ref 12–46)
Lymphs Abs: 3.8 10*3/uL (ref 0.7–4.0)
Neutro Abs: 8.7 10*3/uL — ABNORMAL HIGH (ref 1.7–7.7)

## 2010-11-17 LAB — CULTURE, BLOOD (ROUTINE X 2)
Culture  Setup Time: 201208111418
Culture: NO GROWTH

## 2010-11-17 LAB — PROTIME-INR
INR: 3.27 — ABNORMAL HIGH (ref 0.00–1.49)
Prothrombin Time: 33.8 s — ABNORMAL HIGH (ref 11.6–15.2)

## 2010-11-17 LAB — BASIC METABOLIC PANEL
Calcium: 9.8 mg/dL (ref 8.4–10.5)
Creatinine, Ser: 0.93 mg/dL (ref 0.50–1.10)
GFR calc Af Amer: >60 mL/min (ref >60)

## 2010-11-17 LAB — CBC
MCH: 31 pg (ref 26.0–34.0)
MCHC: 33.3 g/dL (ref 30.0–36.0)
MCV: 93 fL (ref 78.0–100.0)
Platelets: 894 10*3/uL — ABNORMAL HIGH (ref 150–400)
RDW: 15.1 % (ref 11.5–15.5)

## 2010-11-19 NOTE — Consult Note (Signed)
[name], Brooks                  ACCOUNT NO.:  [id number]  MEDICAL RECORD NO.:  [id number]  LOCATION:  5508                         FACILITY:  [facility]  PHYSICIAN:  Acey Lav, MD  DATE OF BIRTH:  1970/08/11  DATE OF CONSULTATION:  11/06/2010 DATE OF DISCHARGE:                                CONSULTATION   REQUESTING PHYSICIAN:  Hind I Elsaid, MD  REASON FOR INFECTIOUS DISEASE CONSULTATION:  Leukocytosis.  DISCUSSION:  I have examined the patient with my visiting physician, Dr. Gerlene Burdock.  I have reviewed the electronic and paper medical record including the history of present illness, past medical history, past surgical history, family history, social history, allergies, medications, 12-point review of systems.  I have examined the patient and agree with the findings as described in my visiting physician's note with the following addendum:  Briefly, this is a 40 year old Caucasian female with a past medical history significant for schizophrenia, polysubstance abuse, post-traumatic stress disorder, and Raynaud's disease.  He was admitted with abdominal pain, nausea and vomiting, and chest pain.  She was found on imaging of the abdomen and pelvis to have multiple thromboembolic phenomenon involving her kidneys and liver.  She was begun on workup for hypercoagulable state, but also had blood cultures done to look for possible infectious cause of her frontal emboli.  Blood cultures have remained sterile.  She has had urine cultures done, which have been negative.  She has had a CT angiogram done, which disclosed intramural thrombus along the lateral wall of the aortic arch and proximal ascending aorta with splenic and left hepatic lobe infarcts and some diffuse pulmonary edema seen on CT angiogram. The patient has been anticoagulated.  She also has been treated with antibiotics, although it is not clear what is being treated.  She was given Zosyn from the first through the  fifth of August and then imipenem was initiated earlier today.  She has been afebrile for majority of her hospital stay with a reported history of fever at home, but no documented fevers other than temperature of 100.3 during her stay.  Her vital signs have been stable.  She has had an elevated white blood cell count and this has persisted throughout her hospital stay.  Her admission CBC differential during this stay was 22,000, which was elevated compared to CBC with differential in June where it was 15,200 and in comparison to CBC differential in March 2009 where white count was normal at 7.9.  Her white blood cell count has remained elevated despite all of the antibiotics that she has received and despite initiation of anticoagulation.  We were asked to consult to assist in the workup of her leukocytosis.  PHYSICAL EXAMINATION:  GENERAL:  On exam, the patient is in no acute distress.  She is alert and oriented x4. HEENT:  Normocephalic, atraumatic.  Pupils are equal, round, and reactive to light.  Sclerae icteric.  Oropharynx clear. NECK:  Supple. CARDIOVASCULAR:  Regular rate and rhythm.  No murmurs, gallops, or rubs heard. LUNGS:  Clear to auscultation bilaterally without wheeze, rhonchi, or rales. ABDOMEN:  Soft, nondistended.  Positive for some epigastric tenderness. EXTREMITIES:  Without edema. NEUROLOGIC:  Nonfocal.  LABORATORY DATA:  As described above and in the visiting student's note.  IMPRESSION/RECOMMENDATIONS:  This is a 40 year old Caucasian female with known thoracic aortic mobile thrombus, lupus anticoagulant positive with evidence of emboli to spleen and liver who has had elevated white blood cell count throughout this admission.  Leukocytosis:  I believe this leukocytosis is likely due to her known thoracic aortic thrombus and emboli that are emanating from it.  I see no evidence that the patient has an infected thrombosis or infectious endocarditis.  Her  blood cultures have been sterile and she appears to have a hypercoagulable state driving this pathology.  She does on review of system mention some dental pain and say that her teeth are in need of repair and she does actually have multiple broken teeth and poor dentition.  I will for thoroughness sake get a maxillofacial CT scan to make sure she does not have an occult dental abscess, although I am not terribly suspicious for this.  I will also get a Panorex scan as well. If these findings are unrevealing, I would not go further down the road of doing much work to pursue an infectious etiology, but would rather observe her off antibiotics and see if she has fevers or other symptoms to suggest an infection, but I think her entire constellation of symptoms can be explained by her known thoracic aortic thrombus with emboli.  Thank you for Infectious Disease consultation.     Acey Lav, MD     CV/MEDQ  D:  11/06/2010  T:  11/07/2010  Job:  [id number]  cc:   Laurice Record, M.D. Hind Bosie Helper, MD Evelene Croon, M.D.  Electronically Signed by Paulette Blanch DAM MD on 11/19/2010 12:01:35 PM
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
## 2010-11-22 ENCOUNTER — Encounter: Payer: Self-pay | Admitting: Infectious Disease

## 2010-11-22 ENCOUNTER — Ambulatory Visit (INDEPENDENT_AMBULATORY_CARE_PROVIDER_SITE_OTHER): Payer: Medicaid Other | Admitting: Infectious Disease

## 2010-11-22 VITALS — BP 118/91 | HR 132 | Temp 99.2°F | Ht 66.5 in | Wt 182.8 lb

## 2010-11-22 DIAGNOSIS — D72829 Elevated white blood cell count, unspecified: Secondary | ICD-10-CM

## 2010-11-22 DIAGNOSIS — I8290 Acute embolism and thrombosis of unspecified vein: Secondary | ICD-10-CM | POA: Insufficient documentation

## 2010-11-22 DIAGNOSIS — K209 Esophagitis, unspecified without bleeding: Secondary | ICD-10-CM

## 2010-11-22 DIAGNOSIS — I38 Endocarditis, valve unspecified: Secondary | ICD-10-CM

## 2010-11-22 DIAGNOSIS — R42 Dizziness and giddiness: Secondary | ICD-10-CM

## 2010-11-22 DIAGNOSIS — I749 Embolism and thrombosis of unspecified artery: Secondary | ICD-10-CM

## 2010-11-22 MED ORDER — FLUCONAZOLE 100 MG PO TABS: 100.0000 mg | ORAL_TABLET | Freq: Every day | ORAL | Status: AC

## 2010-11-22 NOTE — Assessment & Plan Note (Signed)
Orthostatics were checked and the patient did become more tachycardic but did not drop her blood pressure. It is largely due to anxiety.

## 2010-11-22 NOTE — Assessment & Plan Note (Signed)
We'll give her impaired course of fluconazole for possible candidal esophagitis.

## 2010-11-22 NOTE — Patient Instructions (Signed)
We will get a CT angiogram repeated of your aorta in early September You should continue your blood thinner (coumadin) in meantime And continue the IV antibiotics  I wil give you fluconazole in case you have thrush which might be making you gag  Make an appt to see Dr. Daiva Eves in 4 weeks time

## 2010-11-22 NOTE — Assessment & Plan Note (Signed)
No clear cause identified. We are treating her under the presumption that she may have an infected thrombus in her aorta. Note true therapy this would not just involve antibiotics and also vascular surgery. Would note that her white count did come down since she was rechecked in urgent care clinic last Friday.

## 2010-11-22 NOTE — Assessment & Plan Note (Signed)
See above discussion the patient will continue on Coumadin we'll continue antibiotics we'll get a CT and a gram

## 2010-11-22 NOTE — Progress Notes (Signed)
Subjective:    Patient ID: [name], female    DOB: 10/10/1970, 40 y.o.   MRN: [id number]  HPI  40 year old Caucasian  female with a past medical history significant for schizophrenia,   polysubstance abuse, post-traumatic stress disorder, and Raynaud's   disease.  He was admitted with abdominal pain, nausea and vomiting, and   chest pain.  She was found on imaging of the abdomen and pelvis to have   multiple thromboembolic phenomenon involving her kidneys and liver.  She   was begun on workup for hypercoagulable state, but also had blood   cultures done to look for possible infectious cause of her frontal   emboli.  Blood cultures have remained sterile.  She has had urine   cultures done, which have been negative.  She has had a CT angiogram   done, which disclosed intramural thrombus along the lateral wall of the   aortic arch and proximal ascending aorta with splenic and left hepatic   lobe infarcts and some diffuse pulmonary edema seen on CT angiogram.   The patient has been anticoagulated.  She also has been treated with   antibiotics. We stopped these in the hospital and gathered multiple b blood cultures all of which were negative. The patient underwent a transesophageal echocardiogram which failed to show any evidence of endocarditis but did show a mobile thrombus within the aorta. The patient was followed closely by oncology and by cardiovascular surgery and cardiology. Oncology were convinced the patient did have an infection that was driving her elevated white blood cell count. The vas or surgery was not convinced this was an infected embolism and did not see the reason for surgery. I had a very mixed feelings myself. I certainly felt there was absolutely no conclusive evidence for an infected thrombus. I contemplated observing her off antibiotics further with further repeat blood cultures versus giving her impaired antibiotic therapy for an endovascular infection. We therefore  started patient on vancomycin and Rocephin with plans for her to complete at least 4 weeks of therapy. We also planned a repeat CT and exam to reevaluate the clot. Since being discharged from hospital patient complains of difficulty swallowing food and gagging. She is eating less food and then weaker. She has some lightheadedness with standing rapidly. She was tachycardic and initially on arrival in the clinic but did admit to feeling anxious. She has no subjective fever though her temp in clinic was above 99. In all we spent greater than 45 minutes with this pt including greater than 50% of time in face to face counselling and in coordination of care  Review of Systems  Constitutional: Positive for appetite change. Negative for fever, chills, diaphoresis, activity change, fatigue and unexpected weight change.  HENT: Negative for congestion, sore throat, rhinorrhea, sneezing, trouble swallowing and sinus pressure.   Eyes: Negative for photophobia and visual disturbance.  Respiratory: Negative for cough, chest tightness, shortness of breath, wheezing and stridor.   Cardiovascular: Negative for chest pain, palpitations and leg swelling.  Gastrointestinal: Positive for nausea. Negative for vomiting, abdominal pain, diarrhea, constipation, blood in stool, abdominal distention and anal bleeding.  Genitourinary: Negative for dysuria, hematuria, flank pain and difficulty urinating.  Musculoskeletal: Negative for myalgias, back pain, joint swelling, arthralgias and gait problem.  Skin: Negative for color change, pallor, rash and wound.  Neurological: Positive for dizziness. Negative for tremors, weakness and light-headedness.  Hematological: Negative for adenopathy. Does not bruise/bleed easily.  Psychiatric/Behavioral: Negative for behavioral problems, confusion, sleep  disturbance, dysphoric mood, decreased concentration and agitation.       Objective:   Physical Exam  Constitutional: She is oriented to  person, place, and time. She appears well-developed and well-nourished. No distress.  HENT:  Head: Normocephalic and atraumatic.  Mouth/Throat: Oropharynx is clear and moist. No oropharyngeal exudate.  Eyes: Conjunctivae and EOM are normal. Pupils are equal, round, and reactive to light. No scleral icterus.  Neck: Normal range of motion. Neck supple. No JVD present.  Cardiovascular: Regular rhythm and normal heart sounds.  Tachycardia present.  Exam reveals no gallop and no friction rub.   No murmur heard. Pulmonary/Chest: Effort normal and breath sounds normal. No respiratory distress. She has no wheezes. She has no rales. She exhibits no tenderness.  Abdominal: She exhibits no distension and no mass. There is no tenderness. There is no rebound and no guarding.  Musculoskeletal: She exhibits no edema and no tenderness.  Lymphadenopathy:    She has no cervical adenopathy.  Neurological: She is alert and oriented to person, place, and time. She has normal reflexes. She exhibits normal muscle tone. Coordination normal.  Skin: Skin is warm and dry. She is not diaphoretic. No erythema. No pallor.       picc line cdi  Psychiatric: She has a normal mood and affect. Her behavior is normal. Judgment and thought content normal.          Assessment & Plan:  Leukocytosis No clear cause identified. We are treating her under the presumption that she may have an infected thrombus in her aorta. Note true therapy this would not just involve antibiotics and also vascular surgery. Would note that her white count did come down since she was rechecked in urgent care clinic last Friday.  Thrombus See above discussion the patient will continue on Coumadin we'll continue antibiotics we'll get a CT and a gram  Esophagitis We'll give her impaired course of fluconazole for possible candidal esophagitis.  Dizziness Orthostatics were checked and the patient did become more tachycardic but did not drop her  blood pressure. It is largely due to anxiety.

## 2010-11-23 NOTE — H&P (Signed)
[name], [name]                  ACCOUNT NO.:  [id number]  MEDICAL RECORD NO.:  [id number]  LOCATION:  [facility]                         FACILITY:  [facility]  PHYSICIAN:  Houston Siren, MD           DATE OF BIRTH:  [birth year]  DATE OF ADMISSION:  10/28/2010 DATE OF DISCHARGE:                             HISTORY & PHYSICAL   PRIMARY CARE PHYSICIAN:  None.  ADVANCE DIRECTIVE:  Full code.  REASON FOR ADMISSION:  Possible intra-abdominal emboli, abdominal pain, nausea and vomiting.  HISTORY OF PRESENT ILLNESS:  This is a 40 year old female with history of schizophrenia, hypokalemia, polysubstance abuse including THC and cigarettes, Raynaud's disease, presented to the emergency room complaining of severe abdominal pain and bilateral flank pain.  She also has intractable nausea and vomiting.  These symptoms started rather acutely about 3 days ago.  She denied any diarrhea, but said that she has soft stool.  She has had no fever or chills.  She has no rash or any bruises.  There has been no headache, sore throat.  She had no black stool or bloody stool, and she did not have any urinary complaints. Evaluation in the emergency room showed that she is afebrile with leukocytosis, a [place] of 244[address], hemoglobin is 15.7.  Her urinalysis shows many bacteria with only 3-6 wbcs and trace leukocyte esterase.  Her INR is normal.  An abdominal pelvic CT was done which showed hypodense wedge shape in both kidneys and in the spleen suggestive that this may represent multiple emboli.  I reviewed the films with our radiologist, Dr. Chilton Si and although metastatic disease and septic emboli were possible, they are less likely, and Dr. Chilton Si felt that this may represent emboli.  She really has no significant risk for thromboembolic disease, but she is a smoker and she really does not know her family history well.  She is not on any birth control as she had tubal ligation.  Hospitalist was asked to admit the  patient for further evaluation and treatment.  PAST MEDICAL HISTORY: 1. Schizophrenia. 2. Possible schizoaffective disorder. 3. Hypokalemia. 4. Poor family support. 5. Polysubstance abuse. 6. Post-traumatic stress syndrome.  CURRENT MEDICATIONS:  Valium, Vicodin, Cogentin, and Prolixin injection every 2 weeks.  ALLERGIES:  DARVOCET.  FAMILY HISTORY:  She does not know her family history well.  PHYSICAL EXAMINATION:  VITAL SIGNS:  Blood pressure 150/100, temperature 98.1, pulse of about 110, respiratory rate of 18. GENERAL:  She is alert and oriented and is in no apparent distress.  She has no splinter hemorrhage. HEENT:  Sclerae are nonicteric. NECK:  Supple.  She has no bruit. CARDIAC:  S1, S2.  I did not hear any murmur, rub, or gallop. LUNGS:  Clear. ABDOMEN:  Rather benign.  She has no rebound, no tenderness even to deep palpation.  Bowel sounds present.  No palpable mass.  No bruises. SKIN:  Warm and dry and she has no rash. EXTREMITIES:  Good pulses bilaterally, has no calf tenderness. NEUROLOGIC/PSYCHIATRIC:  Unremarkable as well.  OBJECTIVE FINDINGS:  Potassium of 4.0, creatinine at 1.  [place] of [address], hemoglobin of 15.7, platelet count of 396,000.  Pregnancy test is negative.  Urinalysis showed 3-6 wbc with many epithelial cells, trace leukocyte esterase, and many bacteria.  INR is normal at 1.1.  Abdominal pelvic CT as noted above with possible multiple thromboembolic disease. I did not see an EKG and this will be done a.s.a.p.  IMPRESSION:  This is a 40 year old who has psychiatric history and polysubstance abuse, presented with abdominal pain and abdominal pelvic CT shows suggestion of thromboembolic showers.  She does have some risk for hypercoagulable including slight obesity and tobacco use, but really had no significant risks, and I do not know why she would be throwing blood clots.  Septic emboli is always a consideration, and if that is the case,  we will be concerned about endocarditis.  She does not use IV drugs and has no other stigmata of endocarditis.  She is not on birth control pill, and we do not know her family history.  I think that we will be obligated to anticoagulate her fully, until we figure out whether or not this truly represents thromboembolic disease.  We will need to get 2 sets of blood cultures before starting antibiotics, and after it is obtained, we will give Rocephin 2 g IV q.24 h.  I discussed with Dr. Chilton Si and reviewed the films, and it may be beneficial to get a CT angiogram to see whether or not we able to see any thrombus formation in the renal artery or splenic artery.  She is a full code.  She is quite stable, and we will admit her to [facility].  I will go ahead and get full thromboembolic workup (hypercoagulable state along with a sed rate).     Houston Siren, MD     PL/MEDQ  D:  10/29/2010  T:  10/29/2010  Job:  [id number]  Electronically Signed by Houston Siren  on 11/23/2010 12:23:27 AM

## 2010-11-28 ENCOUNTER — Other Ambulatory Visit: Payer: Self-pay | Admitting: Hematology and Oncology

## 2010-11-28 ENCOUNTER — Encounter (HOSPITAL_BASED_OUTPATIENT_CLINIC_OR_DEPARTMENT_OTHER): Payer: Medicaid Other | Admitting: Hematology and Oncology

## 2010-11-28 DIAGNOSIS — I749 Embolism and thrombosis of unspecified artery: Secondary | ICD-10-CM

## 2010-11-28 DIAGNOSIS — D689 Coagulation defect, unspecified: Secondary | ICD-10-CM

## 2010-11-28 LAB — CBC WITH DIFFERENTIAL/PLATELET
BASO%: 0.2 % (ref 0.0–2.0)
Eosinophils Absolute: 0.4 10*3/uL (ref 0.0–0.5)
LYMPH%: 19 % (ref 14.0–49.7)
MCHC: 34.1 g/dL (ref 31.5–36.0)
MCV: 93.9 fL (ref 79.5–101.0)
MONO%: 6.5 % (ref 0.0–14.0)
NEUT%: 71.7 % (ref 38.4–76.8)
Platelets: 403 10*3/uL — ABNORMAL HIGH (ref 145–400)
RBC: 4.22 10*6/uL (ref 3.70–5.45)

## 2010-11-28 LAB — COMPREHENSIVE METABOLIC PANEL
AST: 18 U/L (ref 0–37)
BUN: 5 mg/dL — ABNORMAL LOW (ref 6–23)
Calcium: 9.8 mg/dL (ref 8.4–10.5)
Chloride: 100 mEq/L (ref 96–112)
Creatinine, Ser: 0.95 mg/dL (ref 0.50–1.10)
Glucose, Bld: 100 mg/dL — ABNORMAL HIGH (ref 70–99)

## 2010-11-28 LAB — PROTHROMBIN TIME: Prothrombin Time: 40.6 seconds — ABNORMAL HIGH (ref 11.6–15.2)

## 2010-11-28 LAB — PROTIME-INR

## 2010-12-06 ENCOUNTER — Ambulatory Visit (HOSPITAL_COMMUNITY): Admission: RE | Admit: 2010-12-06 | Payer: Medicaid Other | Source: Ambulatory Visit

## 2010-12-06 ENCOUNTER — Ambulatory Visit (HOSPITAL_COMMUNITY): Payer: Medicaid Other

## 2010-12-06 ENCOUNTER — Other Ambulatory Visit (HOSPITAL_COMMUNITY): Payer: Medicaid Other

## 2010-12-06 ENCOUNTER — Telehealth: Payer: Self-pay | Admitting: *Deleted

## 2010-12-06 ENCOUNTER — Inpatient Hospital Stay (HOSPITAL_COMMUNITY): Admission: RE | Admit: 2010-12-06 | Payer: Medicaid Other | Source: Ambulatory Visit

## 2010-12-06 ENCOUNTER — Other Ambulatory Visit: Payer: Self-pay | Admitting: Family Medicine

## 2010-12-06 ENCOUNTER — Other Ambulatory Visit: Payer: Self-pay | Admitting: Infectious Disease

## 2010-12-06 ENCOUNTER — Ambulatory Visit (HOSPITAL_COMMUNITY)
Admission: RE | Admit: 2010-12-06 | Discharge: 2010-12-06 | Disposition: A | Discharge status: Home / Self Care | Payer: Medicaid Other | Source: Ambulatory Visit | Attending: Family Medicine | Admitting: Family Medicine

## 2010-12-06 DIAGNOSIS — R079 Chest pain, unspecified: Secondary | ICD-10-CM | POA: Insufficient documentation

## 2010-12-06 DIAGNOSIS — R52 Pain, unspecified: Secondary | ICD-10-CM

## 2010-12-06 DIAGNOSIS — J438 Other emphysema: Secondary | ICD-10-CM | POA: Insufficient documentation

## 2010-12-06 DIAGNOSIS — I748 Embolism and thrombosis of other arteries: Secondary | ICD-10-CM | POA: Insufficient documentation

## 2010-12-06 MED ORDER — IOHEXOL 300 MG/ML  SOLN
80.0000 mL | Freq: Once | INTRAMUSCULAR | Status: AC | PRN
  Administered 2010-12-06: 80 mL via INTRAVENOUS

## 2010-12-06 NOTE — Telephone Encounter (Signed)
Charity with [facility] called to make sure md saw the report. He is not in clinic. I gave her his pager # & she will let him know. Stated it was not a called report but she wanted to make sure he was aware of findings

## 2010-12-08 ENCOUNTER — Telehealth: Payer: Self-pay | Admitting: Licensed Clinical Social Worker

## 2010-12-08 NOTE — Telephone Encounter (Signed)
Patient called concerned of test results, I let her know that the ct of her chest shows no pulmonary embolism under impression. The patient had other questions that I wasn't comfortable answering and wish to direct to Dr. Daiva Eves.

## 2010-12-12 NOTE — Consult Note (Signed)
[name], [name]                  ACCOUNT NO.:  [id number]  MEDICAL RECORD NO.:  [id number]  LOCATION:  5508                         FACILITY:  [facility]  PHYSICIAN:  Laurice Record, M.D.DATE OF BIRTH:  1971/02/24  DATE OF CONSULTATION:  10/31/2010 DATE OF DISCHARGE:                                CONSULTATION   CONSULTING PHYSICIAN:  Laurice Record, MD.  PRIMARY PHYSICIAN:  [facility].  REFERRING PHYSICIAN:  Marcellus Scott, MD.  REASON FOR CONSULTATION:  Hypercoagulability.  HISTORY OF PRESENT ILLNESS:  [name] is a 40 year old woman with a history of Raynaud's phenomenon, tobacco abuse, obesity, as well as hypercholesterolemia, admitted on October 28, 2010, with a 4-day history of abdominal and bilateral flank pain, nausea and vomiting for 3 days.  She was evaluated with a CT of the abdomen and pelvis, which revealed bilateral renal and splenic wedge-shaped areas of hypoattenuation withno excretion seen from the right kidney.  This was felt likely secondary to emboli.  CT angiogram of the abdomen and pelvis on October 29, 2010 showed bilateral wedge-shaped perfusion defects consistent with renal infarction, worse in the right kidney.  The hilar branch of the renal artery and aortic thrombus was seen that was likely source of embolus. Based on these findings, we were asked to see her in consultation, for further evaluation.  PAST MEDICAL HISTORY: 1. Schizophrenia. 2. Polysubstance abuse. 3. Panic attacks. 4. Raynaud's phenomenon. 5. UTI at this admission. 6. Hypokalemia at this admission. 7. Hypercholesterolemia. 8. Tobacco abuse. 9. Wide-complex tachycardia on telemetry.   SURGERIES:  Bilateral tubal ligation.  ALLERGIES:  DARVOCET.  MEDICATIONS:  Ecotrin, Cogentin, Rocephin, Prolixin, Neurontin, heparin, Lopressor, Valium, Norco, Dilaudid, Zofran.  FAMILY HISTORY:  Unsure if there is any family history of clots.  She does not know the  family history very well.  SOCIAL HISTORY:  The patient is single.  She has three children.  Lives in [place].  She smokes two packs a day for 20 years.  No alcohol. She does take marijuana.  REVIEW OF SYSTEMS:  Slight presence of fever with no chills or night sweats.  She is dyspneic on exertion and has experienced hypoxia today, she also has had blood tinged sputum.  She has presence of palpitations. She has had weight gain.  Nausea and vomiting for the last 4 days have been present.  Rest of her review of systems is negative.    PHYSICAL EXAMINATION:  GENERAL:  This is an moderately obese, 40 year old white female, flat affect. VITAL SIGNS:  Blood pressure 102/72, pulse 115, respirations 22, temperature 98.6, O2 sats 91% in 5 liters, weight 82.7 kg, height 66 inches. HEENT:  Normocephalic, atraumatic.  Oral cavity without thrush or lesions. NECK:  Supple.  No cervical or supraclavicular masses. LUNGS:  Clear to auscultation bilaterally.  No axillary masses. CARDIOVASCULAR:  Regular rate and rhythm without murmurs, rubs, or gallops. ABDOMEN:  Soft, nontender.  Decreased bowel sounds x4.  No hepatosplenomegaly. GU:  Deferred. RECTAL:  Deferred. EXTREMITIES:  No clubbing or cyanosis.  No edema. SKIN:  Without lesions, bruising, or petechial rash. NEURO:  Nonfocal. Lymph nodes: No palpable adenopathy.  LABS:  Hemoglobin 11.2, hematocrit 33.4, white count 21.8, platelets 303, MCV 93.9, ANC 18.3, lymphocytes 1.9, monocytes 1.8.  PTT 38, PT is 26.6, INR 2.41.  Sodium 135, potassium 3.7, chloride 101, CO2 23, BUN 6, creatinine 1.34, glucose 100, total bili 0.2, alkaline phosphatase 60, AST 31, ALT 18, total protein 5.9, albumin 3.2, calcium 8.7.  Sed rate is 50.  TSH 3.871.  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 Dr. Dalene Carrow has seen and evaluated the patient.  This is a 40-year-     old woman, who is being evaluated for hypercoagulable state with     positive labs showing lupus anticoagulant positive with aortic     thrombus and renal and splenic infarcts.  We agree with     anticoagulation with heparin, bridged to Coumadin.  We would not     stop heparin until her INR is greater than 2.  Maintain INR     between 2 and 3.  We will complete hypercoagulable workup with B2     glycoprotein and cardiolipin antibodies.  There is concern for     hypoxia, and it would be good to rule out PE with a chest CT     angiogram.  Rule out plasma cell dyscrasias with SPEP and     immunofixation. 2. History of Raynaud's phenomenon.  The patient may have an underlying     connective tissue disorder.  She has elevated ESR, would recommend     ANA, rheumatoid factor, and C-reactive protein. If negative, consider rheumatology     consult. 3. Wide-complex tachycardia, undergoing cardiac evaluation.  Thank you very much for allowing [place] the opportunity to participate in the care of [name].  We will follow on the results and further recommendations are to follow.      Marlowe Kays, PA-C   ______________________________ Laurice Record, M.D.    SW/MEDQ  D:  11/01/2010  T:  11/01/2010  Job:  [id number]  Electronically Signed by Marlowe Kays P.A. on 11/02/2010 10:04:48 AM Electronically Signed by Arlan Organ M.D. on 12/12/2010 10:27:05 AM

## 2010-12-13 NOTE — Telephone Encounter (Signed)
Reviewed films with her. She still feels lousy. We may need to reimage the liver with CT but will hold for now

## 2010-12-25 ENCOUNTER — Ambulatory Visit (INDEPENDENT_AMBULATORY_CARE_PROVIDER_SITE_OTHER): Payer: Medicaid Other | Admitting: Infectious Disease

## 2010-12-25 ENCOUNTER — Encounter: Payer: Self-pay | Admitting: Infectious Disease

## 2010-12-25 VITALS — BP 114/81 | HR 120 | Temp 97.9°F | Wt 180.0 lb

## 2010-12-25 DIAGNOSIS — D72829 Elevated white blood cell count, unspecified: Secondary | ICD-10-CM

## 2010-12-25 DIAGNOSIS — N912 Amenorrhea, unspecified: Secondary | ICD-10-CM

## 2010-12-25 DIAGNOSIS — I8290 Acute embolism and thrombosis of unspecified vein: Secondary | ICD-10-CM

## 2010-12-25 DIAGNOSIS — I749 Embolism and thrombosis of unspecified artery: Secondary | ICD-10-CM

## 2010-12-25 DIAGNOSIS — K7689 Other specified diseases of liver: Secondary | ICD-10-CM

## 2010-12-25 DIAGNOSIS — K769 Liver disease, unspecified: Secondary | ICD-10-CM | POA: Insufficient documentation

## 2010-12-25 LAB — COMPLETE METABOLIC PANEL WITH GFR
Albumin: 4 g/dL (ref 3.5–5.2)
BUN: 5 mg/dL — ABNORMAL LOW (ref 6–23)
CO2: 22 mEq/L (ref 19–32)
Calcium: 9 mg/dL (ref 8.4–10.5)
Chloride: 102 mEq/L (ref 96–112)
GFR, Est Non African American: >60 mL/min (ref >60)
Glucose, Bld: 84 mg/dL (ref 70–99)
Potassium: 4 mEq/L (ref 3.5–5.3)
Sodium: 135 mEq/L (ref 135–145)
Total Protein: 7.2 g/dL (ref 6.0–8.3)

## 2010-12-25 LAB — URINALYSIS, ROUTINE W REFLEX MICROSCOPIC
Bilirubin Urine: NEGATIVE
Bilirubin Urine: NEGATIVE
Ketones, ur: NEGATIVE
Nitrite: NEGATIVE
Nitrite: NEGATIVE
Protein, ur: NEGATIVE
Specific Gravity, Urine: 1 — ABNORMAL LOW
Urobilinogen, UA: 0.2
Urobilinogen, UA: 0.2

## 2010-12-25 LAB — CBC WITH DIFFERENTIAL/PLATELET
HCT: 43.7 % (ref 36.0–46.0)
Hemoglobin: 14.7 g/dL (ref 12.0–15.0)
Lymphocytes Relative: 19 % (ref 12–46)
Lymphs Abs: 2.9 10*3/uL (ref 0.7–4.0)
MCHC: 33.6 g/dL (ref 30.0–36.0)
Monocytes Absolute: 1.1 10*3/uL — ABNORMAL HIGH (ref 0.1–1.0)
Monocytes Relative: 7 % (ref 3–12)
Neutro Abs: 11.4 10*3/uL — ABNORMAL HIGH (ref 1.7–7.7)
Neutrophils Relative %: 73 % (ref 43–77)
RBC: 4.56 MIL/uL (ref 3.87–5.11)
WBC: 15.7 10*3/uL — ABNORMAL HIGH (ref 4.0–10.5)

## 2010-12-25 LAB — DIFFERENTIAL
Basophils Absolute: 0.1
Eosinophils Absolute: 0.1
Eosinophils Relative: 1
Neutrophils Relative %: 53

## 2010-12-25 LAB — COMPREHENSIVE METABOLIC PANEL
ALT: 12
AST: 24
CO2: 32
Calcium: 9.8
Chloride: 96
Creatinine, Ser: 0.65
GFR calc Af Amer: >60
GFR calc non Af Amer: >60
Glucose, Bld: 113 — ABNORMAL HIGH
Sodium: 138
Total Bilirubin: 1.1

## 2010-12-25 LAB — CBC
Hemoglobin: 13.3
MCHC: 33.7
MCV: 98.9
RBC: 3.98
WBC: 7.9

## 2010-12-25 LAB — POTASSIUM: Potassium: 3.6

## 2010-12-25 LAB — RAPID URINE DRUG SCREEN, HOSP PERFORMED
Cocaine: NOT DETECTED
Tetrahydrocannabinol: NOT DETECTED

## 2010-12-25 LAB — PREGNANCY, URINE: Preg Test, Ur: NEGATIVE

## 2010-12-25 NOTE — Assessment & Plan Note (Signed)
Appeared to be due to thromboembolism from aorta. WIll reimage liver with dedicated ct given one was minimally larger

## 2010-12-25 NOTE — Assessment & Plan Note (Signed)
Continue coumadin. Never any proof of it being infected. Sp 4 weeks of antibiotic rx. Will reculture blood today

## 2010-12-25 NOTE — Progress Notes (Signed)
Subjective:    Patient ID: [name], female    DOB: 24-Jun-1970, 40 y.o.   MRN: [id number]  HPI  40 year old Caucasian female with a past medical history significant for schizophrenia,  polysubstance abuse, post-traumatic stress disorder, and Raynaud's  disease. He was admitted with abdominal pain, nausea and vomiting, and  chest pain. She was found on imaging of the abdomen and pelvis to have  multiple thromboembolic phenomenon involving her kidneys and liver. She  was begun on workup for hypercoagulable state, but also had blood  cultures done to look for possible infectious cause of her frontal  emboli. Blood cultures have remained sterile. She has had urine  cultures done, which have been negative. She has had a CT angiogram  done, which disclosed intramural thrombus along the lateral wall of the  aortic arch and proximal ascending aorta with splenic and left hepatic  lobe infarcts and some diffuse pulmonary edema seen on CT angiogram.  The patient has been anticoagulated. She also has been treated with  antibiotics. We stopped these in the hospital and gathered multiple b blood cultures all of which were negative. The patient underwent a transesophageal echocardiogram which failed to show any evidence of endocarditis but did show a mobile thrombus within the aorta. The patient was followed closely by oncology and by cardiovascular surgery and cardiology. Oncology were convinced the patient did have an infection that was driving her elevated white blood cell count. The vas or surgery was not convinced this was an infected embolism and did not see the reason for surgery. I had a very mixed feelings myself. I certainly felt there was absolutely no conclusive evidence for an infected thrombus. I contemplated observing her off antibiotics further with further repeat blood cultures versus giving her empiric antibiotic therapy for an endovascular infection. We therefore started patient on  vancomycin and Rocephin with plans for her to complete at least 4 weeks of therapy> She completed this and a repeat CT of the chest was performed which showed stable exopytic thrombus within the aorta. CT scan showed incidental increase in size of one of the hepatic lesions from prior ct scan. The patient has not had recurrent fevers, chills but continues to feel weak and fatigued. We spent greater than 45 minutes with the pt including greater than 50% of the time in face to face counselling of the pt and in coordination of care   Review of Systems  Constitutional: Positive for fatigue. Negative for fever, chills, diaphoresis, activity change, appetite change and unexpected weight change.  HENT: Negative for congestion, sore throat, rhinorrhea, sneezing, trouble swallowing and sinus pressure.   Eyes: Negative for photophobia and visual disturbance.  Respiratory: Negative for cough, chest tightness, shortness of breath, wheezing and stridor.   Cardiovascular: Negative for chest pain, palpitations and leg swelling.  Gastrointestinal: Negative for nausea, vomiting, abdominal pain, diarrhea, constipation, blood in stool, abdominal distention and anal bleeding.  Genitourinary: Negative for dysuria, hematuria, flank pain and difficulty urinating.  Musculoskeletal: Negative for myalgias, back pain, joint swelling, arthralgias and gait problem.  Skin: Negative for color change, pallor, rash and wound.  Neurological: Negative for dizziness, tremors, weakness and light-headedness.  Hematological: Negative for adenopathy. Does not bruise/bleed easily.  Psychiatric/Behavioral: Negative for behavioral problems, confusion, sleep disturbance, dysphoric mood, decreased concentration and agitation.       Objective:   Physical Exam  Constitutional: She is oriented to person, place, and time. She appears well-developed and well-nourished. No distress.  HENT:  Head:  Normocephalic and atraumatic.  Mouth/Throat:  Oropharynx is clear and moist. No oropharyngeal exudate.  Eyes: Conjunctivae and EOM are normal. Pupils are equal, round, and reactive to light. No scleral icterus.  Neck: Normal range of motion. Neck supple. No JVD present.  Cardiovascular: Normal rate, regular rhythm and normal heart sounds.  Exam reveals no gallop and no friction rub.   No murmur heard. Pulmonary/Chest: Effort normal and breath sounds normal. No respiratory distress. She has no wheezes. She has no rales. She exhibits no tenderness.  Abdominal: She exhibits no distension and no mass. There is no tenderness. There is no rebound and no guarding.  Musculoskeletal: She exhibits no edema and no tenderness.  Lymphadenopathy:    She has no cervical adenopathy.  Neurological: She is alert and oriented to person, place, and time. She has normal reflexes. She exhibits normal muscle tone. Coordination normal.  Skin: Skin is warm and dry. She is not diaphoretic. No erythema. No pallor.  Psychiatric: She has a normal mood and affect. Her behavior is normal. Judgment and thought content normal.          Assessment & Plan:  Thrombus Continue coumadin. Never any proof of it being infected. Sp 4 weeks of antibiotic rx. Will reculture blood today  Leukocytosis Not clear what caused high wbc. The clot itself? Will recheck bloodwork today  Hepatic lesion Appeared to be due to thromboembolism from aorta. WIll reimage liver with dedicated ct given one was minimally larger

## 2010-12-25 NOTE — Assessment & Plan Note (Signed)
Not clear what caused high wbc. The clot itself? Will recheck bloodwork today

## 2010-12-26 LAB — SEDIMENTATION RATE: Sed Rate: 16 mm/hr (ref 0–22)

## 2010-12-29 ENCOUNTER — Encounter: Payer: Self-pay | Admitting: Infectious Disease

## 2010-12-31 LAB — CULTURE, BLOOD (SINGLE): Organism ID, Bacteria: NO GROWTH

## 2011-01-01 ENCOUNTER — Other Ambulatory Visit (HOSPITAL_COMMUNITY): Payer: Medicaid Other

## 2011-01-04 ENCOUNTER — Other Ambulatory Visit (HOSPITAL_COMMUNITY): Payer: Medicaid Other

## 2011-01-09 NOTE — Discharge Summary (Signed)
[name], [name]                  ACCOUNT NO.:  [id number]  MEDICAL RECORD NO.:  [id number]  LOCATION:  5508                         FACILITY:  [facility]  PHYSICIAN:  Baltazar Najjar, MD     DATE OF BIRTH:  October 01, 1970  DATE OF ADMISSION:  10/28/2010 DATE OF DISCHARGE:                              DISCHARGE SUMMARY   PRIMARY CARE PHYSICIAN:  Dr. Lynne Leader at [facility].  HEMATOLOGIST:  Dr. Thalia Party.  INFECTIOUS DISEASE PHYSICIAN:  Dr. Paulette Blanch Dam.  VASCULAR SURGEON:  Dr. Evelene Croon.  DISCHARGE DIAGNOSES: 1. Mobile aortic thrombus. 2. 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 3. Leukocytosis without obvious infection, presumed septic aortic     thrombus or possibly endocarditis. 4. Thrombocytosis. 5. Urinary tract infection. 6. Hypoxia.  The rest of her diagnoses are consistent with her past medical history and include: 1. Schizophrenia. 2. Post traumatic stress syndrome. 3. Raynaud's syndrome. 4. Hypokalemia. 5. Tobacco and marijuana use.  CONSULTANTS DURING HER HOSPITALIZATION: 1. Dr. Margaretha Glassing Odogwu of hematology and oncology. 2. Dr. Evelene Croon of Vascular Surgery. 3. Dr. Paulette Blanch Dam of Infectious Disease. 4. Dr. Valera Castle of cardiology. 5. Dr. Eulogio Ditch of Psychiatry.  PROCEDURES THIS HOSPITALIZATION: 1. On  November 11, 2010 the patient had a PICC line placed for long-     term IV access. 2. The patient had a transesophageal echocardiogram done by Dr. Marca Ancona on November 08, 2010.  Results demonstrated normal wall     thickness.  Estimated just ejection fraction of 55%-60%, no wall     motion abnormalities.  No evidence of vegetation or stenosis in the     aortic valve.  The aorta demonstrated a partially mobile thrombus     attached to the aortic wall at the junction of the arch and the     descending thoracic aorta near the ostium of the left subclavian.  There is a 2.6-cm segment extending into the aorta lumen but it is     very mobile.  The patient also had a 2-D echo on October 31, 2010.  HISTORY AND BRIEF HOSPITAL COURSE:  [name] is a very pleasant 40 year old female with a history of schizophrenia and polysubstance abuse as well as Raynaud's syndrome.  She presented to the emergency department on October 28, 2010 complaining of severe abdominal pain and bilateral flank pain as well as nausea and vomiting.  Her symptoms had started rather suddenly three days earlier.  She was admitted to the [facility] and immediately started on anticoagulation with heparin for multiple emboli found on CT angio pelvis and both of her kidneys and spleen.  Later during her hospitalization she was also found to have embolic infarcts in her kidneys as well as the mobile thrombus in her aortic arch.  Initially the patient was very hypoxic and tachycardic as well as diaphoretic.  It was painful for her to inspire deeply, it was felt that she may have pulmonary embolus.  However, we were unable to initially do a CT angio of her chest secondary to elevated BUN  and creatinine most likely from contrast given for the CT angio of her abdomen and pelvis.    She began having wide complex tachycardia and a cardiology consult was called.  She was seen by Dr. Valera Castle, who placed her on a low-dose beta blocker and agreed with a TEE at some point in the future.  The patient was seen by hematology and oncology, specifically Dr. Thalia Party who did a thorough workup and monitored the patient throughout her hospital stay.  She evaluated her for possible anti phospholipid antibody syndrome as well as any genetic causes of clotting disorder as well as Sj"gren's and vasculitis.  All of these were ruled out.  Dr. Dalene Carrow recommended a rheumatology consult be called.  Multiple telephone conversations were had between this P.A. and Dr. Alben Deeds of  rheumatology.  Dr. Dierdre Forth recommended that multiple serologies be run.  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  He felt that the serologies bore this out as she had a high a lupus anticoagulant antibody.  Despite the fact that her Cardiolipin antibodies and her beta II glycoprotein antibodies were not positive.  He and Dr. Dalene Carrow  conversed about the case and felt that the appropriate treatment for the patient, no matter what the diagnosis was, was lifelong anticoagulation on aspirin and Coumadin.    After the aortic thrombus was found Dr. Evelene Croon of vascular surgery was consulted for possible removal of the thrombus.  He felt that the thrombus was too small and the surgery was too risky and the best course of action was anticoagulation.  The patient remained in the hospital as she was still hypoxic, her white count continued to climb, it went to a high of 23.  The patient was maintained on antibiotics.  However, her white count did not come down.  In conversations with infectious disease and hematology it was felt that the patient's thrombus and the wall that it was attached to needed to be evaluated for infection.  Cardiology was reconsulted specifically for a TEE.    The TEE was done, it showed no sign of infection but did show a large mobile thrombus that Dr. Marca Ancona felt was high risk.  Dr. Laneta Simmers was reconsulted to evaluate the patient in light of her transesophageal echocardiogram results.  Again he felt that the best course of action was anticoagulation and to avoid surgery.  He also discussed the fact that the thrombus may regrow.  More conversations were had with infectious disease and hematology in light of Dr. Sharee Pimple consultation.    It was felt that the best course of action was to treat the patient empirically for possible septic thrombus.  Consequently serial blood cultures were drawn and she was placed on 4  weeks of IV vancomycin and Rocephin.  This morning the patient's hypoxia has resolved.  Her pain is decreased and she is ambulating well and eating well, having bowel movements and desires discharge to home.  PHYSICAL EXAM:  GENERAL APPEARANCE:  The patient is alert and oriented. She feels much improved. VITAL SIGNS:  Her temperature is 98.8, pulse 88, respirations 18, blood pressure 151/67. HEENT:  Head is atraumatic normocephalic.  Eyes are anicteric.  The pupils are equal, round, and reactive to light.  Nose shows no nasal discharge or exterior lesions.  Mouth has moist mucous membranes and moderate dentition. NECK:  Supple with midline trachea.  No JVD.  No lymphadenopathy. CHEST:  Demonstrates no accessory muscle use.  She has no wheezes or crackles to my exam. HEART:  Regular rate and rhythm without obvious murmurs, rubs or gallops. ABDOMEN: Soft, slightly tender in the left upper quadrant with good bowel sounds.  No obvious masses. EXTREMITIES:  Show no clubbing, cyanosis or edema. PSYCHIATRIC:  The patient is calm, she is alert and oriented.  She is pleasant and cooperative.  Her grooming is good. SKIN:  Shows no rashes, bruises or lesions.Marland Kitchen  PERTINENT LABS:  On November 13, 2010 her white count is 15.1, hemoglobin 9.8, hematocrit 30.5, platelets 899.  PT 23.7, INR 2.07.  Sodium 138, potassium 3.3, chloride 104, bicarb 24, glucose 146, BUN 8, creatinine 1.07, blood cultures done on August 9, 10 and 11, 2012 are still preliminary in status that showed no growth to date.  The patient also had a ferritin level this hospitalization, which was 718.  As mentioned she had a thorough workup from hematology for causes of clotting disorders.  Please see progress note dated November 07, 2010 for results of the serologies.  RADIOLOGICAL EXAMS: 1. The patient had a CT maxillofacial to rule out sources of     infection.  This was done on November 08, 2010.  It did show dental     disease with  dental caries, most significant involving the right     first mandibular molar with the destruction of the tooth and     periapical lucency.  No soft tissue abscesses were identified.  The     patient had a CT angio of her chest done on November 01, 2010.  No     evidence of pulmonary embolism. 2. On October 29, 2010 the patient had a CT angio of her abdomen and     pelvis  that showed acute bilateral renal an associated splenic     infarcts, right renal artery hilar branch hypodense filling defect     consistent with a thromboembolus and also incompletely imaged     descending thoracic aorta, aortic nonocclusive filling defect     consistent with aortic thrombus and likely the source of emboli to     the kidneys and spleen.  DISCHARGE MEDICATIONS: 1. Aspirin 325 mg 1 tablet by mouth daily. 2. Rocephin 2 grams injected intravenously daily, take for 26 days     through December 09, 2010. 3. Docusate sodium 100 mg caplets 2 by mouth daily at bedtime. 4. Hydrocodone/acetaminophen 5/325 1-2 tablets by mouth q.6h. as     needed for pain.  She had been given a prescription for 10 days.     She understands that if she needs refills the will have to come     from her primary care physician. 5. Lovenox 120 mg subcutaneously daily take for 5 days. 6. Nicotine transdermal 7 mcg q.24h. take for 7 days. 7. Vancomycin 1000 mg per 200 mL intravenously b.i.d. take for 26 days     through December 09, 2010. 8. Benztropine 1 mg tablet by mouth twice daily. 9. Diazepam 5 mg 1 tablet by mouth q.8h. as needed for anxiety. 10.Gabapentin 400 mg 1 capsule by mouth t.i.d. 11.Prolixin Decanoate 25 mg intramuscularly every 2 weeks, her next     dose is due on August 2016.  DISCHARGE INSTRUCTIONS:  The patient is being discharged to home today in stable condition.  She has a PICC line placed for IV antibiotics. She will go home with home health RN that will come twice daily to administer her IV antibiotics and draw  her blood for an INR check as appropriate.  The patient has received instruction about Coumadin, knows not to eat leafy green vegetables.  She has received counseling multiple times about tobacco and marijuana use and has committed to stop smoking, although I think she could use more support and reemphasis in this area.  SUGGESTIONS FOR OUTPATIENT FOLLOW-UP:  The patient will see Dr. Melven Sartorius tomorrow, November 14, 2010 for an INR check, also her BMET and CBC need to be checked as the patient has been on vancomycin and her BUN and creatinine levels need to be closely monitored. Her leukocytosis has been slowly declining on antibiotic therapy.  However, her platelets have been increasing.  She will be given a short prescription for pain medication that if appropriate Dr. Melven Sartorius can refill.  Next the patient needs to follow up on her Prolixin injection which she currently receives at [facility] once every 2 weeks.  She will be due for it again on November 16, 2010.    This is a very complex patient.  She has been instructed that if she has sudden difficulty breathing or develops any unexplained swelling or severe pain she needs to come to the emergency department as she is at very high risk for clotting.     Stephani Police, PA   ______________________________ Baltazar Najjar, MD    MLY/MEDQ  D:  11/13/2010  T:  11/13/2010  Job:  [id number]  cc:   Acey Lav, MD Fax: Effie Shy, M.D. [address] [place] [address] [place]  Laurice Record, M.D. Fax: [phone]  Lynne Leader, MD Fax: [phone]  Electronically Signed by Algis Downs PA on 11/24/2010 08:27:36 AM Electronically Signed by Hannah Beat MD on 01/09/2011 11:43:23 AM

## 2011-01-09 NOTE — Discharge Summary (Signed)
  [name], [name]                  ACCOUNT NO.:  [id number]  MEDICAL RECORD NO.:  [id number]  LOCATION:  5508                         FACILITY:  [facility]  PHYSICIAN:  Baltazar Najjar, MD     DATE OF BIRTH:  [birth year]  DATE OF ADMISSION:  10/28/2010 DATE OF DISCHARGE:  11/13/2010                              DISCHARGE SUMMARY   ADDENDUM:  PRIMARY CARE PHYSICIAN:  Lynne Leader, MD  The patient will be scheduled for follow-up appointments with: 1. Dr. Arlan Organ on November 28, 2010, at 10:45 a.m. 2. Dr. Paulette Blanch Dam on November 22, 2010, at 11:15 a.m. 3. Dr. Lynne Leader on November 14, 2010, at 10 a.m.  She will be discharged to home with [facility] for INR checks, help with antibiotic administration and Lovenox administration. The patient has been instructed to stop smoking and avoid leafy green vegetables and report to the ED if she has any severe onset of sudden pain, difficulty breathing or swelling. All consultants involved in her care during her hospital of care were contacted and informed about discharge plans ,they are all agreeble that patient can be dischaged home on anticogulation with arrangments for oupatient follow up.     Stephani Police, PA   ______________________________ Baltazar Najjar, MD    MLY/MEDQ  D:  11/13/2010  T:  11/13/2010  Job:  [id number]  cc:   Lynne Leader, MD  Electronically Signed by Algis Downs PA on 11/13/2010 12:52:41 PM Electronically Signed by Hannah Beat MD on 01/09/2011 11:43:06 AM

## 2011-01-20 NOTE — Progress Notes (Signed)
[name], [name]                  ACCOUNT NO.:  [id number]  MEDICAL RECORD NO.:  [id number]  LOCATION:  5508                         FACILITY:  [facility]  PHYSICIAN:  Soleil Mas I Areen Trautner, MD      DATE OF BIRTH:  01-16-1971                                PROGRESS NOTE   PRIMARY CARE PHYSICIAN: [facility], Dr. Humphrey Rolls  CURRENT DIAGNOSES: 1. Hypercoagulable state with multiple thromboemboli to her liver,     bilateral kidneys, and spleen as well as a thoracic aorta mobile     thrombus 2. Tachycardia with nonsustained wide complex tachycardia. 3. Tobacco and marijuana abuse. 4. Anxiety with a history of schizo-affective disorder. 5. Persistent leukocytosis. 6. Hypoxia. 7. Urinary tract infection.  The rest of her diagnoses are consistent with her past medical history and include: 1. Schizophrenia. 2. Post traumatic stress syndrome. 3. Raynaud syndrome. 4. Hypokalemia. 5. Schizo-affective disorder.  CONSULTATIONS: 1.  Dr. Valera Castle, Cardiology 2.  Dr. Thalia Party, Hematology 3.  Dr. Evelene Croon, Vascular Surgery 4.  Dr. Christian Mate, Psychiatry 5.  Dr. Paulette Blanch Dam, Infectious Disease  PROCEDURES: 1.  CT Angio Abdomin/Pelvis 2.  CT Angion Chest 3.  2D Echo Cardiogram 4.  Transesophageal Echocardiogram  HISTORY AND BRIEF HOSPITAL COURSE: [name] is a very pleasant 40 year old female with past medical history listed above who presented to the emergency room on October 28, 2010, complaining of severe abdominal pain, bilateral flank pain, nausea, and vomiting.  Her symptoms had started 3 days prior.  Her evaluation in the emergency department showed that she had leukocytosis of 22,100, and urinary analysis that showed many bacteria with 3-6 white blood cells and trace leukocyte.  An abdominal pelvic CT was done that showed hypodense wedge shape emboli in both kidneys and the spleen.  The patient reported that she had never used IV drugs.  She  did not know her father's family history.  Her mother had no history significant for blood disorders or clotting.  She was not on birth control pills.  She is slightly obese, but not sedentary and did not look to be at any particular increased risk for hypercoagulability.    The patient was admitted to the hospitalist service and started on anticoagulation with heparin and Coumadin.  In the beginning of her hospitalization, the patient remained both tachycardic and hypoxic.  She did have some episodes of nonsustained wide complex tachycardia, and a Cardiology consult was called.  The patient was seen by Dr. Valera Castle who agreed with request for transesophageal echocardiogram to evaluate a possible thoracic aorta thrombus.  He also recommended low-dose beta-blockers, and suggested carotid Doppler ultrasound for possible right-sided carotid bruit.  The patient ended up having Doppler ultrasounds of her bilateral lower extremities.  There were no DVTs or superficial thromboses found.  She also had carotid Dopplers that showed very mild noncalcific plaque in and near the bifurcations.  No ICA stenosis noted. She had a 2D echocardiogram done on October 31, 2010, that showed wall thickness was increased with a pattern of mild LVH.  Her EF was 60-65%. There were no regional wall motion abnormalities.  She did  have grade 1 diastolic dysfunction with abnormal left ventricular relaxation, were found to be within normal limits.  There was no pericardial effusion. No cardiac source of emboli identified on her 2D echo.  Transesophageal echocardiogram is pending for tomorrow, November 08, 2010.  This will be primarily to evaluate her thoracic aorta mobile thrombus for possible septic thrombus as well as to rule out endocarditis.  The patient remained hypoxic for most of the hospitalization requiring oxygen via nasal cannula between 2 and 4 L.  Without oxygen, she would quickly desaturate down into the  80s and today November 07, 2010, is the first day that the patient has been able to ambulate with physical therapy without oxygen and not drop her O2 sats.  Because she was hypoxic and tachycardic with leukocytosis and chest pain, it was felt that she may have pulmonary emboli.  A CT angio of her chest was done on November 01, 2010, that showed no evidence of pulmonary embolism.  It did show a small amount of nonocclusive intraluminal thrombus along the lateral wall of the aortic arch and proximal descending aorta.  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  Her last chest x-ray was done on November 04, 2010, that showed improvement in her pulmonary edema and minimal basilar patchy densities persist with low lung volumes.  These are felt to be atelectasis.  The patient was encouraged to use spirometry.  She has worked regularly with physical therapy, and her ambulatory O2 sats have been tracked.  Hypercoagulable state.  Hematology consult was requested, and the patient was seen by Dr. Arlan Organ on October 31, 2010.  Dr.  Dalene Carrow did a thorough consultation including ANA, rheumatoid factor, C-reactive protein, cardiolipin antibodies, beta-2 glycoprotein, SPEP, and lupus anticoagulant, and result of her consultation was that she did not feel the patient had lupus.  She did not feel the patient had anti- phospholipid antibody syndrome.  She was uncertain of the source of the patient thromboemboli, but felt certainly that the treatment would still be anticoagulation long-term with Coumadin.  The patient will have followup in 6 weeks with Dr. Dalene Carrow as an outpatient.  During her initial consultation Dr. Dalene Carrow suggested that we needed to rule out vasculitis and connective tissue disease.  Consequently, Dr. Alben Deeds of Rheumatology was called.  Multiple discussions were had  over the phone with Dr. Dierdre Forth.  He recommended that serologies be ordered to further clarify lupus, vasculitis, and Sjogren's.  These were ordered and all found to be within normal limits or negative.  Consequently, the patient was not felt to have any of these syndromes.  However, she did have an elevated lupus anticoagulant.  Consequently, Dr. Dierdre Forth felt that she could have catastrophic antiphospholipid antibody syndrome. Given the fact that she had multiple thromboemboli in multiple organ systems, it came on rather suddenly and she had a positive lupus antibody despite the fact that her cardiolipins were normal, and her beta-2 glycoproteins were normal.  He discussed this directly with Dr. Dalene Carrow.  They agreed on the fact that the treatment is still the same. The patient will require a long-term anticoagulation with Coumadin. There was some discussion about the fact that if this was catastrophic antiphospholipid antibody syndrome, perhaps the patient would benefit from high-dose steroids.  However, over the course of her hospitalization, the patient improved clinically without steroids.  Next, with regard to her hypercoagulable syndrome, vascular consultation  was called, and the patient was seen by Dr. Evelene Croon.  After thorough evaluation, Dr. Laneta Simmers recommended treating the patient with long-term anticoagulation including initially heparin and aspirin as well as Coumadin followed by long-term Coumadin and aspirin.  He did not feel that the patient needed surgery as her thoracic aorta thrombus was small, and that she should have a followup CT scan in approximately 2 weeks to ensure that the aortic thrombus decreased in size, did not increase in size.  He also mentioned that he felt that if the thrombus was removed surgically, it could recur.  Schizo-affective disorder and anxiety, not otherwise specified.  The patient did show signs of increased anxiety and on November 03, 2010,  she was seen in consultation by Dr. Eulogio Ditch, and the patient had a previous diagnosis of schizo-affective disorder and was on Prolixin dosed by IM injection every 2 weeks.  Dr. Rogers Blocker felt that the patient was logical and goal-directed, not homicidal or suicidal.  He did explain to her that she needed to limit the amount of Valium that she took, and as the patient was asking for increased doses of Valium, he felt that her medication regimen she was currently on in the hospital was appropriate for her.  He diagnosed her with 1. Chronic schizo-affective disorder. 2. Anxiety disorder, not otherwise specified as well as a history of     drug abuse, specifically marijuana.  Persistent leukocytosis.  Initially, the patient's leukocytosis was felt to be due to possible urinary tract infection, and she was placed on Rocephin.  Blood cultures were drawn on October 29, 2010, and after 5 days were found to be negative with no growth.  Next, her leukocytosis was felt to be reactive from her multiple thromboemboli.  However, doctors remained concerned that the leukocytosis was not decreasing and an Infectious Disease consultation was called.  The patient was seen by Dr. Acey Lav on November 06, 2010.  He looked for sources of her leukocytosis and have ordered a maxillofacial CT scan to rule out any occult dental abscesses.  The patient did have dental disease with multiple dental caries most significantly involving the first right mandibular molar with destruction of the tooth and periapical lucency. No soft tissue abscess was identified, and the paranasal sinuses were clear.    Dr. Daiva Eves also ordered a peripheral blood smear, HIV antibody and serial blood cultures, as well as the procalcitonin.  Blood smear revealed toxic granulation.  Dr. Daiva Eves discussed this with Dr. Arlan Organ, and it was felt that it was important that the source of infection or leukocytosis be  identified.  Consequently, transesophageal echocardiogram was ordered and is scheduled to take place on November 08, 2010.  In addition to transesophageal echo, Dr. Daiva Eves has also ordered serial blood cultures to be done on November 07, 2010, November 08, 2010, and November 09, 2010, as the patient is currently off of antibiotics.  CURRENT PHYSICAL EXAMINATION: GENERAL:  The patient is alert and oriented.  She appears well.  She is ambulating about the room. VITAL SIGNS:  Temperature is 98.1, pulse 94, respirations 20, blood pressure 117/87, O2 sats 95% on room air.  The patient is asking for discharge to home. HEENT:  Head is atraumatic, normocephalic.  Eyes are anicteric with pupils that are equal, round, and reactive to light.  Nose shows no nasal discharge or exterior lesions.  Mouth has moist mucous membranes. NECK:  Supple with midline trachea.  No JVD.  No lymphadenopathy. CHEST:  Demonstrates no accessory muscle use.  She has no wheezes or crackles to my exam. HEART:  Slightly tachycardic, has a regular rhythm without obvious murmurs, rubs, or gallops. ABDOMEN:  Slightly obese, nontender, nondistended with bowel sounds. EXTREMITIES:  No clubbing, cyanosis or edema.  As mentioned, the patient is able to ambulate about the room without difficulty. NEURO:  Cranial nerves II through XII are grossly intact.  She has no facial asymmetries.  No obvious focal neuro deficits. PSYCHIATRIC.  The patient's is alert and oriented.  Her demeanor is much more calm now.  Grooming is good.  She is appropriate.  PERTINENT LABORATORY RESULTS THIS ADMISSION: On November 07, 2010, white blood cell count was 20.5, hemoglobin 11.9, hematocrit 35.4, platelet count 640, both her white blood cell count and platelet count appear to be climbing slowly.  Today, her INR is 3.26 and her PT is 33.7.  BMET is essentially within normal limits with a potassium of 3.7, sodium 136, BUN 12, creatinine 1.03.  LFTs  were initially elevated, have now come down to a total bilirubin 0.4, alkaline phosphatase 284, AST 67, ALT 67.  Her serum albumin is currently 2.6 with a calcium of 9.7.  HIV studies were nonreactive. Urinary analysis of November 03, 2010, was negative for any signs of infection.  Fecal occult blood negative.  C. diff PCR negative.  Urine culture showed no growth, final on November 04, 2010.  Smith antibody 1, SSA/Ro 1, SSA/La antibody less than 1.  DNA antibody double-stranded is 2.  Cardiolipin antibodies were low.  IgG was 2, IgM was 5, IgA was 4. Beta-2 glycoprotein IgG 0, IgM 1, IgA 2.  Rheumatoid factor was less than 10.  ANA negative.  ANCA panel, her MPO absolute was less than 9. PR3 less than 3.5, C-ANCA less than 1:20, P-ANCA less than 1:20 ratio, atypical P-ANCA less than 1:20 ratio.  Protein electrophoresis, serum albumin 43.1, serum alpha 110.5, serum alpha-2 20.1, serum beta 5.3, beta-2 8.2.  Serum gamma globulin 12.8, M-spike not detected.  C3 complement with a 187, C4 complement with 38.  TSH was 3.871.  Lipid panel showed a cholesterol of 154, triglycerides 171, HDL 28, LDL 92, and VLDL 34.  Cardiac markers were cycled when she was first admitted and found to be negative x2.  She had BNP on November 02, 2010, of 1203, and again on November 03, 2010 of 231.  Pertinent radiological exams were listed above in hospital course.  CURRENT MEDICATIONS: 1. Aspirin 325 25 mg 1 tablet daily. 2. Cogentin 1 mg p.o. b.i.d. 3. Prolixin 25 mg IM once every 2 weeks on Wednesday. 4. Gabapentin 400 mg p.o. t.i.d. 5. Metoprolol 12.5 mg p.o. b.i.d. 6. Nicotine 14 mg 1  patch q.24 h. 7. Protonix 40 mg 1 tablet q.12 h. 8. Warfarin protocol as determined by pharmacy . 9. Valium 5 mg p.o. q.8 h. p.r.n. agitation. 10.Norco 1-2 tablets p.o. q.6 h. p.r.n. pain.  The patient is currently much improved.  Her tachycardia and hypoxia have resolved.  We are still looking for source of infection  and consequently, she will undergo transesophageal echocardiogram tomorrow and have serial blood cultures done over today, tomorrow, and Thursday. We anticipate the patient if she is stable will be discharged to home on Thursday, November 09, 2010.     Stephani Police, PA   ______________________________ Suzzanne Cloud, MD    MLY/MEDQ  D:  11/07/2010  T:  11/08/2010  Job:  [id number]  cc:   [facility]  Electronically Signed by Algis Downs PA on 11/09/2010 09:55:56 AM Electronically Signed by Ebony Cargo MD on 01/20/2011 01:31:28 PM

## 2011-04-21 ENCOUNTER — Telehealth: Payer: Self-pay | Admitting: Hematology and Oncology

## 2011-04-21 NOTE — Telephone Encounter (Signed)
Talked to pt, gave her appt for 05/30/11, lab and MD

## 2011-05-30 ENCOUNTER — Ambulatory Visit: Payer: Medicaid Other | Admitting: Physician Assistant

## 2011-05-30 ENCOUNTER — Other Ambulatory Visit: Payer: Medicaid Other

## 2011-08-17 ENCOUNTER — Inpatient Hospital Stay (HOSPITAL_COMMUNITY)
Admission: EM | Admit: 2011-08-17 | Discharge: 2011-08-24 | DRG: 194 | Disposition: A | Discharge status: Home / Self Care | Payer: Medicaid Other | Attending: Internal Medicine | Admitting: Internal Medicine

## 2011-08-17 ENCOUNTER — Emergency Department (HOSPITAL_COMMUNITY): Payer: Medicaid Other

## 2011-08-17 DIAGNOSIS — I38 Endocarditis, valve unspecified: Secondary | ICD-10-CM

## 2011-08-17 DIAGNOSIS — L02229 Furuncle of trunk, unspecified: Secondary | ICD-10-CM | POA: Diagnosis present

## 2011-08-17 DIAGNOSIS — Z86711 Personal history of pulmonary embolism: Secondary | ICD-10-CM

## 2011-08-17 DIAGNOSIS — I509 Heart failure, unspecified: Secondary | ICD-10-CM | POA: Diagnosis present

## 2011-08-17 DIAGNOSIS — Z720 Tobacco use: Secondary | ICD-10-CM

## 2011-08-17 DIAGNOSIS — R0902 Hypoxemia: Secondary | ICD-10-CM

## 2011-08-17 DIAGNOSIS — F209 Schizophrenia, unspecified: Secondary | ICD-10-CM

## 2011-08-17 DIAGNOSIS — E669 Obesity, unspecified: Secondary | ICD-10-CM | POA: Diagnosis present

## 2011-08-17 DIAGNOSIS — I5030 Unspecified diastolic (congestive) heart failure: Secondary | ICD-10-CM | POA: Diagnosis present

## 2011-08-17 DIAGNOSIS — I7300 Raynaud's syndrome without gangrene: Secondary | ICD-10-CM | POA: Diagnosis present

## 2011-08-17 DIAGNOSIS — I8290 Acute embolism and thrombosis of unspecified vein: Secondary | ICD-10-CM

## 2011-08-17 DIAGNOSIS — R42 Dizziness and giddiness: Secondary | ICD-10-CM

## 2011-08-17 DIAGNOSIS — I2699 Other pulmonary embolism without acute cor pulmonale: Secondary | ICD-10-CM | POA: Diagnosis present

## 2011-08-17 DIAGNOSIS — N912 Amenorrhea, unspecified: Secondary | ICD-10-CM

## 2011-08-17 DIAGNOSIS — N390 Urinary tract infection, site not specified: Secondary | ICD-10-CM

## 2011-08-17 DIAGNOSIS — M545 Low back pain, unspecified: Secondary | ICD-10-CM | POA: Diagnosis present

## 2011-08-17 DIAGNOSIS — L02239 Carbuncle of trunk, unspecified: Secondary | ICD-10-CM | POA: Diagnosis present

## 2011-08-17 DIAGNOSIS — R0602 Shortness of breath: Secondary | ICD-10-CM | POA: Diagnosis present

## 2011-08-17 DIAGNOSIS — E876 Hypokalemia: Secondary | ICD-10-CM | POA: Diagnosis present

## 2011-08-17 DIAGNOSIS — I498 Other specified cardiac arrhythmias: Secondary | ICD-10-CM | POA: Diagnosis present

## 2011-08-17 DIAGNOSIS — Z6829 Body mass index (BMI) 29.0-29.9, adult: Secondary | ICD-10-CM

## 2011-08-17 DIAGNOSIS — T45515A Adverse effect of anticoagulants, initial encounter: Secondary | ICD-10-CM | POA: Diagnosis present

## 2011-08-17 DIAGNOSIS — D72829 Elevated white blood cell count, unspecified: Secondary | ICD-10-CM | POA: Diagnosis present

## 2011-08-17 DIAGNOSIS — R000 Tachycardia, unspecified: Secondary | ICD-10-CM | POA: Diagnosis present

## 2011-08-17 DIAGNOSIS — F4310 Post-traumatic stress disorder, unspecified: Secondary | ICD-10-CM

## 2011-08-17 DIAGNOSIS — F2089 Other schizophrenia: Secondary | ICD-10-CM | POA: Diagnosis present

## 2011-08-17 DIAGNOSIS — K769 Liver disease, unspecified: Secondary | ICD-10-CM

## 2011-08-17 DIAGNOSIS — N179 Acute kidney failure, unspecified: Secondary | ICD-10-CM | POA: Diagnosis present

## 2011-08-17 DIAGNOSIS — R791 Abnormal coagulation profile: Secondary | ICD-10-CM | POA: Diagnosis present

## 2011-08-17 DIAGNOSIS — J189 Pneumonia, unspecified organism: Principal | ICD-10-CM | POA: Diagnosis present

## 2011-08-17 DIAGNOSIS — F17200 Nicotine dependence, unspecified, uncomplicated: Secondary | ICD-10-CM | POA: Diagnosis present

## 2011-08-17 DIAGNOSIS — K7689 Other specified diseases of liver: Secondary | ICD-10-CM | POA: Diagnosis present

## 2011-08-17 DIAGNOSIS — K209 Esophagitis, unspecified without bleeding: Secondary | ICD-10-CM | POA: Diagnosis present

## 2011-08-17 DIAGNOSIS — J4489 Other specified chronic obstructive pulmonary disease: Secondary | ICD-10-CM | POA: Diagnosis present

## 2011-08-17 DIAGNOSIS — J449 Chronic obstructive pulmonary disease, unspecified: Secondary | ICD-10-CM | POA: Diagnosis present

## 2011-08-17 DIAGNOSIS — F1210 Cannabis abuse, uncomplicated: Secondary | ICD-10-CM | POA: Diagnosis present

## 2011-08-17 DIAGNOSIS — Z7901 Long term (current) use of anticoagulants: Secondary | ICD-10-CM

## 2011-08-17 DIAGNOSIS — M542 Cervicalgia: Secondary | ICD-10-CM | POA: Diagnosis present

## 2011-08-17 HISTORY — DX: Other pulmonary embolism without acute cor pulmonale: I26.99

## 2011-08-17 HISTORY — DX: Schizophrenia, unspecified: F20.9

## 2011-08-17 LAB — DIFFERENTIAL
Eosinophils Relative: 0 % (ref 0–5)
Lymphs Abs: 2.1 10*3/uL (ref 0.7–4.0)
Monocytes Absolute: 2.7 10*3/uL — ABNORMAL HIGH (ref 0.1–1.0)
Neutro Abs: 24.9 10*3/uL — ABNORMAL HIGH (ref 1.7–7.7)

## 2011-08-17 LAB — CBC
HCT: 41 % (ref 36.0–46.0)
MCH: 32.5 pg (ref 26.0–34.0)
MCV: 90.5 fL (ref 78.0–100.0)
Platelets: 399 10*3/uL (ref 150–400)
RBC: 4.53 MIL/uL (ref 3.87–5.11)
RDW: 15.5 % (ref 11.5–15.5)

## 2011-08-17 LAB — COMPREHENSIVE METABOLIC PANEL
AST: 77 U/L — ABNORMAL HIGH (ref 0–37)
Albumin: 3.7 g/dL (ref 3.5–5.2)
Alkaline Phosphatase: 86 U/L (ref 39–117)
Chloride: 98 mEq/L (ref 96–112)
Potassium: 3.5 mEq/L (ref 3.5–5.1)
Total Bilirubin: 0.8 mg/dL (ref 0.3–1.2)

## 2011-08-17 MED ORDER — CEFTRIAXONE SODIUM 1 G IJ SOLR
1.0000 g | Freq: Once | INTRAMUSCULAR | Status: AC
  Administered 2011-08-18: 1 g via INTRAVENOUS
  Filled 2011-08-17: qty 10

## 2011-08-17 MED ORDER — DEXTROSE 5 % IV SOLN
500.0000 mg | Freq: Once | INTRAVENOUS | Status: AC
  Administered 2011-08-18: 500 mg via INTRAVENOUS
  Filled 2011-08-17: qty 500

## 2011-08-17 NOTE — ED Notes (Addendum)
C/o back pain, onset last week, "now neck also, was bending over to pick something up last week, pulled a muscle & pinched a nerve in my neck", also have been off my blood thinner for ~ 4d. Takes warfarin. "Hasn't been taking them b/c she forgot, has some at home, but wants [place] to give them to her here". Also "sob", "takes coumadin for blood clots in heart, lungs and liver", has been sitting on couch for 4d d/t pain.

## 2011-08-18 ENCOUNTER — Encounter (HOSPITAL_COMMUNITY): Payer: Self-pay | Admitting: Internal Medicine

## 2011-08-18 ENCOUNTER — Inpatient Hospital Stay (HOSPITAL_COMMUNITY): Payer: Medicaid Other

## 2011-08-18 DIAGNOSIS — R0602 Shortness of breath: Secondary | ICD-10-CM | POA: Diagnosis present

## 2011-08-18 DIAGNOSIS — D696 Thrombocytopenia, unspecified: Secondary | ICD-10-CM

## 2011-08-18 DIAGNOSIS — F209 Schizophrenia, unspecified: Secondary | ICD-10-CM | POA: Diagnosis not present

## 2011-08-18 DIAGNOSIS — I2699 Other pulmonary embolism without acute cor pulmonale: Secondary | ICD-10-CM | POA: Diagnosis present

## 2011-08-18 DIAGNOSIS — Z8679 Personal history of other diseases of the circulatory system: Secondary | ICD-10-CM

## 2011-08-18 DIAGNOSIS — M545 Low back pain: Secondary | ICD-10-CM

## 2011-08-18 DIAGNOSIS — J189 Pneumonia, unspecified organism: Secondary | ICD-10-CM | POA: Diagnosis present

## 2011-08-18 LAB — BASIC METABOLIC PANEL
CO2: 23 mEq/L (ref 19–32)
Calcium: 9 mg/dL (ref 8.4–10.5)
Creatinine, Ser: 1.25 mg/dL — ABNORMAL HIGH (ref 0.50–1.10)
GFR calc non Af Amer: 52 mL/min — ABNORMAL LOW (ref >90)

## 2011-08-18 LAB — POTASSIUM: Potassium: 3.3 mEq/L — ABNORMAL LOW (ref 3.5–5.1)

## 2011-08-18 LAB — HEPARIN LEVEL (UNFRACTIONATED)
Heparin Unfractionated: 0.19 IU/mL — ABNORMAL LOW (ref 0.30–0.70)
Heparin Unfractionated: 0.35 IU/mL (ref 0.30–0.70)

## 2011-08-18 LAB — CBC
MCV: 91.8 fL (ref 78.0–100.0)
Platelets: 355 10*3/uL (ref 150–400)
RDW: 15.5 % (ref 11.5–15.5)
WBC: 24 10*3/uL — ABNORMAL HIGH (ref 4.0–10.5)

## 2011-08-18 MED ORDER — ACETAMINOPHEN 325 MG PO TABS: 650.0000 mg | ORAL_TABLET | Freq: Four times a day (QID) | ORAL | Status: DC | PRN

## 2011-08-18 MED ORDER — ALBUTEROL SULFATE (5 MG/ML) 0.5% IN NEBU: 5.0000 mg | INHALATION_SOLUTION | Freq: Once | RESPIRATORY_TRACT | Status: DC

## 2011-08-18 MED ORDER — BIOTENE DRY MOUTH MT LIQD
15.0000 mL | Freq: Two times a day (BID) | OROMUCOSAL | Status: DC
  Administered 2011-08-18 – 2011-08-24 (×9): 15 mL via OROMUCOSAL

## 2011-08-18 MED ORDER — METOPROLOL TARTRATE 12.5 MG HALF TABLET
12.5000 mg | ORAL_TABLET | Freq: Two times a day (BID) | ORAL | Status: DC
  Administered 2011-08-18 – 2011-08-24 (×13): 12.5 mg via ORAL
  Filled 2011-08-18 (×15): qty 1

## 2011-08-18 MED ORDER — PANTOPRAZOLE SODIUM 40 MG PO TBEC
40.0000 mg | DELAYED_RELEASE_TABLET | Freq: Two times a day (BID) | ORAL | Status: DC
  Administered 2011-08-18 – 2011-08-24 (×13): 40 mg via ORAL
  Filled 2011-08-18 (×13): qty 1

## 2011-08-18 MED ORDER — OXYCODONE HCL 5 MG PO TABS
5.0000 mg | ORAL_TABLET | ORAL | Status: DC | PRN
  Administered 2011-08-18: 5 mg via ORAL
  Filled 2011-08-18: qty 1

## 2011-08-18 MED ORDER — DEXTROSE 5 % IV SOLN: 1.0000 g | INTRAVENOUS | Status: DC

## 2011-08-18 MED ORDER — FLUPHENAZINE DECANOATE 25 MG/ML IJ SOLN: 25.0000 mg | INTRAMUSCULAR | Status: DC

## 2011-08-18 MED ORDER — HEPARIN (PORCINE) IN NACL 100-0.45 UNIT/ML-% IJ SOLN
1650.0000 [IU]/h | INTRAMUSCULAR | Status: DC
  Administered 2011-08-18: 1600 [IU]/h via INTRAVENOUS
  Administered 2011-08-18: 1400 [IU]/h via INTRAVENOUS
  Administered 2011-08-19: 1600 [IU]/h via INTRAVENOUS
  Administered 2011-08-20 – 2011-08-22 (×4): 1650 [IU]/h via INTRAVENOUS
  Filled 2011-08-18 (×8): qty 250

## 2011-08-18 MED ORDER — BENZTROPINE MESYLATE 1 MG PO TABS
1.0000 mg | ORAL_TABLET | Freq: Every day | ORAL | Status: DC
  Filled 2011-08-18 (×2): qty 1

## 2011-08-18 MED ORDER — ALBUTEROL SULFATE (5 MG/ML) 0.5% IN NEBU: 2.5000 mg | INHALATION_SOLUTION | RESPIRATORY_TRACT | Status: DC | PRN

## 2011-08-18 MED ORDER — GABAPENTIN 400 MG PO CAPS
400.0000 mg | ORAL_CAPSULE | Freq: Three times a day (TID) | ORAL | Status: DC
  Administered 2011-08-18 – 2011-08-24 (×19): 400 mg via ORAL
  Filled 2011-08-18 (×22): qty 1

## 2011-08-18 MED ORDER — HEPARIN BOLUS VIA INFUSION
2000.0000 [IU] | Freq: Once | INTRAVENOUS | Status: AC
  Administered 2011-08-18: 2000 [IU] via INTRAVENOUS
  Filled 2011-08-18: qty 2000

## 2011-08-18 MED ORDER — DEXTROSE 5 % IV SOLN
1.0000 g | INTRAVENOUS | Status: DC
  Administered 2011-08-18 – 2011-08-23 (×6): 1 g via INTRAVENOUS
  Filled 2011-08-18 (×7): qty 10

## 2011-08-18 MED ORDER — SODIUM CHLORIDE 0.9 % IV SOLN
INTRAVENOUS | Status: DC
  Administered 2011-08-18 – 2011-08-20 (×4): via INTRAVENOUS
  Administered 2011-08-21: 1000 mL via INTRAVENOUS
  Administered 2011-08-21: 06:00:00 via INTRAVENOUS

## 2011-08-18 MED ORDER — POTASSIUM CHLORIDE 10 MEQ/100ML IV SOLN
10.0000 meq | INTRAVENOUS | Status: AC
  Administered 2011-08-18 (×4): 10 meq via INTRAVENOUS
  Filled 2011-08-18 (×4): qty 100

## 2011-08-18 MED ORDER — HYDROMORPHONE HCL PF 1 MG/ML IJ SOLN
0.5000 mg | INTRAMUSCULAR | Status: DC | PRN
  Administered 2011-08-18: 1 mg via INTRAVENOUS
  Administered 2011-08-19 (×2): 0.5 mg via INTRAVENOUS
  Administered 2011-08-19 – 2011-08-21 (×10): 1 mg via INTRAVENOUS
  Filled 2011-08-18 (×13): qty 1

## 2011-08-18 MED ORDER — WARFARIN - PHARMACIST DOSING INPATIENT
Freq: Every day | Status: DC
  Administered 2011-08-23: 18:00:00

## 2011-08-18 MED ORDER — AZITHROMYCIN 500 MG IV SOLR: 500.0000 mg | INTRAVENOUS | Status: DC

## 2011-08-18 MED ORDER — ACETAMINOPHEN 650 MG RE SUPP: 650.0000 mg | Freq: Four times a day (QID) | RECTAL | Status: DC | PRN

## 2011-08-18 MED ORDER — SODIUM CHLORIDE 0.9 % IV SOLN
INTRAVENOUS | Status: AC
  Administered 2011-08-18: 01:00:00 via INTRAVENOUS

## 2011-08-18 MED ORDER — ZOLPIDEM TARTRATE 5 MG PO TABS
5.0000 mg | ORAL_TABLET | Freq: Every evening | ORAL | Status: DC | PRN
  Administered 2011-08-22 – 2011-08-24 (×2): 5 mg via ORAL
  Filled 2011-08-18 (×2): qty 1

## 2011-08-18 MED ORDER — ALBUTEROL SULFATE (5 MG/ML) 0.5% IN NEBU
2.5000 mg | INHALATION_SOLUTION | Freq: Four times a day (QID) | RESPIRATORY_TRACT | Status: DC
  Administered 2011-08-18 – 2011-08-19 (×3): 2.5 mg via RESPIRATORY_TRACT
  Filled 2011-08-18 (×3): qty 0.5

## 2011-08-18 MED ORDER — ONDANSETRON HCL 4 MG PO TABS: 4.0000 mg | ORAL_TABLET | Freq: Four times a day (QID) | ORAL | Status: DC | PRN

## 2011-08-18 MED ORDER — SODIUM CHLORIDE 0.9 % IJ SOLN
3.0000 mL | Freq: Two times a day (BID) | INTRAMUSCULAR | Status: DC
  Administered 2011-08-19 – 2011-08-23 (×4): 3 mL via INTRAVENOUS

## 2011-08-18 MED ORDER — HYDROCODONE-ACETAMINOPHEN 5-325 MG PO TABS
1.0000 | ORAL_TABLET | ORAL | Status: DC | PRN
  Administered 2011-08-19 – 2011-08-21 (×5): 1 via ORAL
  Filled 2011-08-18 (×5): qty 1

## 2011-08-18 MED ORDER — DEXTROSE 5 % IV SOLN
500.0000 mg | INTRAVENOUS | Status: DC
  Administered 2011-08-18 – 2011-08-23 (×6): 500 mg via INTRAVENOUS
  Filled 2011-08-18 (×7): qty 500

## 2011-08-18 MED ORDER — ONDANSETRON HCL 4 MG/2ML IJ SOLN: 4.0000 mg | Freq: Four times a day (QID) | INTRAMUSCULAR | Status: DC | PRN

## 2011-08-18 MED ORDER — POTASSIUM CHLORIDE CRYS ER 20 MEQ PO TBCR
40.0000 meq | EXTENDED_RELEASE_TABLET | Freq: Once | ORAL | Status: AC
  Administered 2011-08-18: 40 meq via ORAL
  Filled 2011-08-18: qty 2

## 2011-08-18 MED ORDER — DIAZEPAM 5 MG PO TABS
5.0000 mg | ORAL_TABLET | Freq: Three times a day (TID) | ORAL | Status: DC | PRN
  Administered 2011-08-18 – 2011-08-23 (×7): 5 mg via ORAL
  Filled 2011-08-18 (×7): qty 1

## 2011-08-18 MED ORDER — WARFARIN SODIUM 7.5 MG PO TABS
7.5000 mg | ORAL_TABLET | Freq: Once | ORAL | Status: AC
  Administered 2011-08-18: 7.5 mg via ORAL
  Filled 2011-08-18: qty 1

## 2011-08-18 MED ORDER — ALUM & MAG HYDROXIDE-SIMETH 200-200-20 MG/5ML PO SUSP: 30.0000 mL | Freq: Four times a day (QID) | ORAL | Status: DC | PRN

## 2011-08-18 MED ORDER — LORAZEPAM 2 MG/ML IJ SOLN
1.0000 mg | Freq: Once | INTRAMUSCULAR | Status: AC
  Administered 2011-08-18: 1 mg via INTRAVENOUS

## 2011-08-18 NOTE — ED Notes (Signed)
Patient's nails are yellow and not very clean.

## 2011-08-18 NOTE — Progress Notes (Signed)
H&P  reviewed, the patient seen and examined. She is having visual and with history hallucinations and complaining of nonspecific symptoms. RN was in the room.  Assessment and plan: 1/community-acquired pneumonia: -Continue antibiotics, nebs when necessary 2/history of pulmonary emboli: -Subtherapeutic INR noted, continue heparin infusion and Coumadin as per pharmacy -V/Q scan pending .Patient was not taking her coumadin for the last 4 days . 3/hypokalemia: -Replete aggressively, check magnesium level. Monitor on cardiac monitor. 4/lower back pain: -Patient reported a recent fall on her back  -Normal neurological exam, continue pain meds when necessary.would request lumbar spine x-ray.  5/AKI: -Improving with IV fluids to continue, probably related to prerenal azotemia 6/sinus tachycardia: -Agree with checking TSH, VQ scan is pending. Continue metoprolol 7/history of schizophrenia: - patient is actively hallucinating. Continue meds.psychiatric service was consulted . Would request bedside sitter.

## 2011-08-18 NOTE — ED Provider Notes (Signed)
History     CSN: [id number]  Arrival date & time 08/17/11  2042   First MD Initiated Contact with Patient 08/17/11 2321      Chief Complaint  Patient presents with  . Back Pain    (Consider location/radiation/quality/duration/timing/severity/associated sxs/prior treatment) Patient is a 41 y.o. female presenting with back pain. The history is provided by the patient. No language interpreter was used.  Back Pain  This is a new problem. The current episode started more than 2 days ago. The problem occurs constantly. The pain is associated with lifting heavy objects. The pain is present in the thoracic spine. The quality of the pain is described as stabbing. Radiates to: neck. The pain is at a severity of 10/10. The pain is severe. The symptoms are aggravated by bending. The pain is the same all the time. Associated symptoms include chest pain and a fever. Pertinent negatives include no numbness, no weight loss, no headaches, no abdominal pain, no abdominal swelling, no bowel incontinence, no perianal numbness, no bladder incontinence, no dysuria, no pelvic pain, no leg pain, no paresthesias, no paresis, no tingling and no weakness. She has tried nothing for the symptoms. The treatment provided no relief. Risk factors include obesity.    No past medical history on file.  No past surgical history on file.  No family history on file.  History  Substance Use Topics  . Smoking status: Current Everyday Smoker -- 1.5 packs/day    Types: Cigarettes  . Smokeless tobacco: Never Used  . Alcohol Use: No    OB History    Grav Para Term Preterm Abortions TAB SAB Ect Mult Living                  Review of Systems  Constitutional: Positive for fever. Negative for weight loss.  Respiratory: Positive for cough and shortness of breath.   Cardiovascular: Positive for chest pain.  Gastrointestinal: Negative for abdominal pain and bowel incontinence.  Genitourinary: Negative for bladder  incontinence, dysuria and pelvic pain.  Musculoskeletal: Positive for back pain.  Neurological: Negative for tingling, weakness, numbness, headaches and paresthesias.  All other systems reviewed and are negative.    Allergies  Darvocet  Home Medications   Current Outpatient Rx  Name Route Sig Dispense Refill  . ASPIRIN 325 MG PO TABS Oral Take 325 mg by mouth daily.      Marland Kitchen BENZTROPINE MESYLATE 1 MG PO TABS Oral Take 1 mg by mouth.      Marland Kitchen DIAZEPAM 5 MG PO TABS Oral Take 5 mg by mouth every 8 (eight) hours as needed.      Marland Kitchen FLUPHENAZINE DECANOATE 25 MG/ML IJ SOLN Intramuscular Inject 25 mg into the muscle every 14 (fourteen) days.      Marland Kitchen GABAPENTIN 400 MG PO CAPS Oral Take 400 mg by mouth 3 (three) times daily.      Marland Kitchen HYDROCODONE-ACETAMINOPHEN 5-325 MG PO TABS Oral Take 1 tablet by mouth every 6 (six) hours as needed.      Marland Kitchen METOPROLOL TARTRATE 12.5 MG HALF TABLET Oral Take 12.5 mg by mouth 2 (two) times daily.      Marland Kitchen PANTOPRAZOLE SODIUM 40 MG PO TBEC Oral Take 40 mg by mouth every 12 (twelve) hours.      . WARFARIN SODIUM 5 MG PO TABS Oral Take 5 mg by mouth daily.        BP 123/86  Pulse 122  Temp(Src) 98.6 F (37 C) (Oral)  Resp 26  SpO2 94%  LMP 08/17/2011  Physical Exam  Constitutional: She is oriented to person, place, and time. She appears well-developed and well-nourished. No distress.  HENT:  Head: Normocephalic and atraumatic.  Mouth/Throat: Oropharynx is clear and moist.  Eyes: Conjunctivae are normal. Pupils are equal, round, and reactive to light.  Neck: Normal range of motion. Neck supple.  Cardiovascular: Tachycardia present.   Pulmonary/Chest: No stridor. She has decreased breath sounds.  Abdominal: Soft. Bowel sounds are normal. There is no tenderness. There is no rebound and no guarding.  Musculoskeletal: Normal range of motion. She exhibits edema.  Neurological: She is alert and oriented to person, place, and time.  Skin: Skin is warm and dry.    Psychiatric: She has a normal mood and affect.    ED Course  Procedures (including critical care time)  Labs Reviewed  COMPREHENSIVE METABOLIC PANEL - Abnormal; Notable for the following:    Glucose, Bld 118 (*)    BUN 31 (*)    Creatinine, Ser 1.39 (*)    Total Protein 8.5 (*)    AST 77 (*)    GFR calc non Af Amer 46 (*)    GFR calc Af Amer 53 (*)    All other components within normal limits  CBC - Abnormal; Notable for the following:    WBC 29.7 (*)    All other components within normal limits  DIFFERENTIAL - Abnormal; Notable for the following:    Neutrophils Relative 84 (*)    Lymphocytes Relative 7 (*)    Neutro Abs 24.9 (*)    Monocytes Absolute 2.7 (*)    All other components within normal limits  PROTIME-INR - Abnormal; Notable for the following:    Prothrombin Time 17.6 (*)    All other components within normal limits  POCT PREGNANCY, URINE   Dg Chest 2 View  08/17/2011  *RADIOLOGY REPORT*  Clinical Data: Shortness of breath, cough.  CHEST - 2 VIEW  Comparison: 12/06/2010 CT  Findings: Apical scarring and underlying emphysematous changes. Mild lung base opacities.  Heart size upper normal limits. Mediastinal contours otherwise within normal range.  No pneumothorax or pleural effusion.  Osteopenia without definite acute osseous finding.  IMPRESSION: Bibasilar opacities; atelectasis versus pneumonia.  COPD.  Original Report Authenticated By: Waneta Martins, M.D.     1. Community acquired pneumonia       MDM   Date: 08/18/2011  Rate: 124  Rhythm: sinus tachycardia  QRS Axis: normal  Intervals: normal  ST/T Wave abnormalities: nonspecific ST changes  Conduction Disutrbances:none  Narrative Interpretation:   Old EKG Reviewed: none available         Carmel Waddington K Maddon Horton-Rasch, MD 08/18/11 [phone]

## 2011-08-18 NOTE — Progress Notes (Signed)
CRITICAL VALUE ALERT  Critical value received:  K 2.6  Date of notification:  08/18/11  Time of notification:  0628  Critical value read back:yes  Nurse who received alert:  Jacquiline Doe, RN  MD notified (1st page):  Claiborne Billings, NP  Time of first page:  [phone]  Responding MD:  Claiborne Billings, NP  Time MD responded:  0630  NP to put in orders for potassium bolus.

## 2011-08-18 NOTE — Consult Note (Signed)
Reason for Consult: Schizophrenia, capacity Referring Physician: Dr. Porfirio [name] is an 41 y.o. female.  HPI: [name] is an 41 y.o. female complaints of low back pain after lifting a cooler at her home, and also having increased SOB, and fevers and cough for the past 3 -4 days. She was evaluated in the ED and found to have bilateral pneumonia. She also has a history of Pulmonary Emboli and is currently subtherapeutic on her coumadin.   Seen alone and then with her son and boyfriend with her permission. Thinks she came for being confused and back pain. Not taking her psy meds as does not believe in her dx of schizophrenia. Family thinks she has it. Family confirmed her hx of schizophrenia. Pt could not give reasons for being non-compliant with meds. Per boyfriend she does better on meds and she was on long acting meds because her poor compliance. Pt is not on psy meds for sometimes now. More recently she was withdrawn, not coming outsider her room. Talks with herself and see peoples. Makes gestures that scare other peoples and pt appeared to have internal stimuli at times today that was observed by me today. She refuse her psy meds without giving much reasoning today.     Past Medical History  Diagnosis Date  . Pulmonary emboli   . Schizophrenia     Past Surgical History  Procedure Date  . No past surgeries     History reviewed. No pertinent family history.  Social History:  reports that she has been smoking Cigarettes.  She has a 37.5 pack-year smoking history. She has never used smokeless tobacco. She reports that she does not drink alcohol or use illicit drugs.  Allergies:  Allergies  Allergen Reactions  . Darvocet (Propoxyphene-Acetaminophen)     Medications: I have reviewed the patient's current medications.  Results for orders placed during the hospital encounter of 08/17/11 (from the past 48 hour(s))  COMPREHENSIVE METABOLIC PANEL     Status: Abnormal   Collection Time   08/17/11 10:18 PM      Component Value Range Comment   Sodium 139  135 - 145 (mEq/L)    Potassium 3.5  3.5 - 5.1 (mEq/L)    Chloride 98  96 - 112 (mEq/L)    CO2 20  19 - 32 (mEq/L)    Glucose, Bld 118 (*) 70 - 99 (mg/dL)    BUN 31 (*) 6 - 23 (mg/dL)    Creatinine, Ser 4.09 (*) 0.50 - 1.10 (mg/dL)    Calcium 81.1  8.4 - 10.5 (mg/dL)    Total Protein 8.5 (*) 6.0 - 8.3 (g/dL)    Albumin 3.7  3.5 - 5.2 (g/dL)    AST 77 (*) 0 - 37 (U/L)    ALT 31  0 - 35 (U/L)    Alkaline Phosphatase 86  39 - 117 (U/L)    Total Bilirubin 0.8  0.3 - 1.2 (mg/dL)    GFR calc non Af Amer 46 (*) >90 (mL/min)    GFR calc Af Amer 53 (*) >90 (mL/min)   CBC     Status: Abnormal   Collection Time   08/17/11 10:18 PM      Component Value Range Comment   WBC 29.7 (*) 4.0 - 10.5 (K/uL)    RBC 4.53  3.87 - 5.11 (MIL/uL)    Hemoglobin 14.7  12.0 - 15.0 (g/dL)    HCT 91.4  78.2 - 95.6 (%)    MCV 90.5  78.0 - 100.0 (fL)    MCH 32.5  26.0 - 34.0 (pg)    MCHC 35.9  30.0 - 36.0 (g/dL)    RDW 16.1  09.6 - 04.5 (%)    Platelets 399  150 - 400 (K/uL)   DIFFERENTIAL     Status: Abnormal   Collection Time   08/17/11 10:18 PM      Component Value Range Comment   Neutrophils Relative 84 (*) 43 - 77 (%)    Lymphocytes Relative 7 (*) 12 - 46 (%)    Monocytes Relative 9  3 - 12 (%)    Eosinophils Relative 0  0 - 5 (%)    Basophils Relative 0  0 - 1 (%)    Neutro Abs 24.9 (*) 1.7 - 7.7 (K/uL)    Lymphs Abs 2.1  0.7 - 4.0 (K/uL)    Monocytes Absolute 2.7 (*) 0.1 - 1.0 (K/uL)    Eosinophils Absolute 0.0  0.0 - 0.7 (K/uL)    Basophils Absolute 0.0  0.0 - 0.1 (K/uL)    WBC Morphology ATYPICAL LYMPHOCYTES   MILD LEFT SHIFT (1-5% METAS, OCC MYELO, OCC BANDS)  PROTIME-INR     Status: Abnormal   Collection Time   08/17/11 10:18 PM      Component Value Range Comment   Prothrombin Time 17.6 (*) 11.6 - 15.2 (seconds)    INR 1.42  0.00 - 1.49    POCT PREGNANCY, URINE     Status: Normal   Collection Time    08/17/11 11:40 PM      Component Value Range Comment   Preg Test, Ur NEGATIVE  NEGATIVE    MRSA PCR SCREENING     Status: Normal   Collection Time   08/18/11  2:51 AM      Component Value Range Comment   MRSA by PCR NEGATIVE  NEGATIVE    BASIC METABOLIC PANEL     Status: Abnormal   Collection Time   08/18/11  5:10 AM      Component Value Range Comment   Sodium 136  135 - 145 (mEq/L)    Potassium 2.6 (*) 3.5 - 5.1 (mEq/L)    Chloride 96  96 - 112 (mEq/L)    CO2 23  19 - 32 (mEq/L)    Glucose, Bld 87  70 - 99 (mg/dL)    BUN 27 (*) 6 - 23 (mg/dL)    Creatinine, Ser 4.09 (*) 0.50 - 1.10 (mg/dL)    Calcium 9.0  8.4 - 10.5 (mg/dL)    GFR calc non Af Amer 52 (*) >90 (mL/min)    GFR calc Af Amer 61 (*) >90 (mL/min)   CBC     Status: Abnormal   Collection Time   08/18/11  5:10 AM      Component Value Range Comment   WBC 24.0 (*) 4.0 - 10.5 (K/uL)    RBC 4.02  3.87 - 5.11 (MIL/uL)    Hemoglobin 12.6  12.0 - 15.0 (g/dL)    HCT 81.1  91.4 - 78.2 (%)    MCV 91.8  78.0 - 100.0 (fL)    MCH 31.3  26.0 - 34.0 (pg)    MCHC 34.1  30.0 - 36.0 (g/dL)    RDW 95.6  21.3 - 08.6 (%)    Platelets 355  150 - 400 (K/uL)   HEPARIN LEVEL (UNFRACTIONATED)     Status: Abnormal   Collection Time   08/18/11  7:56 AM      Component  Value Range Comment   Heparin Unfractionated 0.19 (*) 0.30 - 0.70 (IU/mL)   MAGNESIUM     Status: Normal   Collection Time   08/18/11  7:56 AM      Component Value Range Comment   Magnesium 2.1  1.5 - 2.5 (mg/dL)   HEPARIN LEVEL (UNFRACTIONATED)     Status: Normal   Collection Time   08/18/11  3:50 PM      Component Value Range Comment   Heparin Unfractionated 0.35  0.30 - 0.70 (IU/mL)   POTASSIUM     Status: Abnormal   Collection Time   08/18/11  3:50 PM      Component Value Range Comment   Potassium 3.3 (*) 3.5 - 5.1 (mEq/L)   MAGNESIUM     Status: Normal   Collection Time   08/18/11  3:50 PM      Component Value Range Comment   Magnesium 2.0  1.5 - 2.5 (mg/dL)     Dg  Chest 2 View  08/17/2011  *RADIOLOGY REPORT*  Clinical Data: Shortness of breath, cough.  CHEST - 2 VIEW  Comparison: 12/06/2010 CT  Findings: Apical scarring and underlying emphysematous changes. Mild lung base opacities.  Heart size upper normal limits. Mediastinal contours otherwise within normal range.  No pneumothorax or pleural effusion.  Osteopenia without definite acute osseous finding.  IMPRESSION: Bibasilar opacities; atelectasis versus pneumonia.  COPD.  Original Report Authenticated By: Waneta Martins, M.D.    ROS Blood pressure 115/83, pulse 94, temperature 98.3 F (36.8 C), temperature source Oral, resp. rate 18, height 5\' 7"  (1.702 m), weight 83 kg (182 lb 15.7 oz), last menstrual period 08/17/2011, SpO2 95.00%. Physical Exam  Alert, on bed  Mental Status Examination/Evaluation: ; oriented to person, place only Objective: Appearance: Fairly Groomed  Psychomotor Activity: Normal  Eye Contact:: fair Speech: slurred Volume: Normal  Mood: upset ay time Affect: ristricted Thought Process: disorganized, responding to internal stimuli, tangential Orientation: Full  Thought Content: AVH, no si, no hi, paranoid now Suicidal Thoughts: No  Homicidal Thoughts: No  Judgement: Impaired  Insight: poor  DIAGNOSIS:   AXIS I  schizphrenia AXIS II  def AXIS III  See medical history.  AXIS IV  unknown AXIS V  20   Treatment Plan Summary:   1. Likely psychotic now. I do not think she has the capacity to refuse her meds for schizophrenia now  2. Recommend to start risperidone 2mg  QHS for psychosis. Family agreed  3. Will follow tomorrow  Wonda Cerise 08/18/2011, 9:42 PM

## 2011-08-18 NOTE — Progress Notes (Signed)
Pt moved to a video camera room due to hearing voices. [name] [facility]

## 2011-08-18 NOTE — Progress Notes (Signed)
ANTICOAGULATION CONSULT NOTE - Follow Up Consult  Pharmacy Consult for Heparin and Coumadin Indication: Hx PE  Allergies  Allergen Reactions  . Darvocet (Propoxyphene-Acetaminophen)     Patient Measurements: Height: 5\' 7"  (170.2 cm) Weight: 182 lb 15.7 oz (83 kg) IBW/kg (Calculated) : 61.6  Heparin Dosing Weight: 79kg  Vital Signs: Temp: 98.2 F (36.8 C) (05/18 1400) Temp src: Oral (05/18 1400) BP: 137/77 mmHg (05/18 1400) Pulse Rate: 105  (05/18 1400)  Labs:  Basename 08/18/11 1550 08/18/11 0756 08/18/11 0510 08/17/11 2218  HGB -- -- 12.6 14.7  HCT -- -- 36.9 41.0  PLT -- -- 355 399  APTT -- -- -- --  LABPROT -- -- -- 17.6*  INR -- -- -- 1.42  HEPARINUNFRC 0.35 0.19* -- --  CREATININE -- -- 1.25* 1.39*  CKTOTAL -- -- -- --  CKMB -- -- -- --  TROPONINI -- -- -- --    Estimated Creatinine Clearance: 65 ml/min (by C-G formula based on Cr of 1.25).   Medications:  Prescriptions prior to admission  Medication Sig Dispense Refill  . aspirin 325 MG tablet Take 325 mg by mouth daily.        . benztropine (COGENTIN) 1 MG tablet Take 1 mg by mouth.        . diazepam (VALIUM) 5 MG tablet Take 5 mg by mouth every 8 (eight) hours as needed.        . gabapentin (NEURONTIN) 400 MG capsule Take 400 mg by mouth 3 (three) times daily.        Marland Kitchen HYDROcodone-acetaminophen (NORCO) 5-325 MG per tablet Take 1 tablet by mouth every 6 (six) hours as needed.        . metoprolol tartrate (LOPRESSOR) 12.5 mg TABS Take 12.5 mg by mouth 2 (two) times daily.        . pantoprazole (PROTONIX) 40 MG tablet Take 40 mg by mouth every 12 (twelve) hours.        Marland Kitchen warfarin (COUMADIN) 5 MG tablet Take 5 mg by mouth daily.         Assessment: 41yof on Heparin and now restarting Coumadin for h/o PE. Heparin level (0.35) is therapeutic after rate increase - will continue current rate. MD has ordered to restart Coumadin. Patient reports not taking dose in last 4 days and is unsure of Coumadin home  regimen. Med Rec and most recent discharge summary lists 5mg  daily. Admit INR (1.42) is subtherapeutic.  - H/H and Plts wnl - No significant bleeding reported - AST/ALT: 77/37  Goal of Therapy:  INR 2-3 Heparin level 0.3-0.7 units/ml Monitor platelets by anticoagulation protocol: Yes   Plan:  1. Continue heparin 1600 units/hr (16 ml/hr) 2. Coumadin 7.5mg  po x 1 today 2. Daily PT/INR, heparin level and CBC  Cleon Dew [phone] 08/18/2011,4:52 PM

## 2011-08-18 NOTE — Progress Notes (Addendum)
Pt refused to have VQ scan and refused to have x ray of her back. Reoreientation attempts unsuccessful at getting pt to allow radiology to do either test. Pt now back in room and watching TV will continue to monitor and assist as needed. Julien Nordmann [facility]

## 2011-08-18 NOTE — Plan of Care (Signed)
Problem: Phase I Progression Outcomes Goal: Initial discharge plan identified Outcome: Completed/Met Date Met:  08/18/11 Return home with son and boyfriend Goal: Other Phase I Outcomes/Goals Outcome: Completed/Met Date Met:  08/18/11 Psych consulted

## 2011-08-18 NOTE — Progress Notes (Signed)
ANTICOAGULATION CONSULT NOTE - Follow Up Consult  Pharmacy Consult for heparin Indication: Hx VTE  Allergies  Allergen Reactions  . Darvocet (Propoxyphene-Acetaminophen)     Patient Measurements: Height: 5\' 7"  (170.2 cm) Weight: 182 lb 15.7 oz (83 kg) IBW/kg (Calculated) : 61.6  Heparin Dosing Weight: 79kg  Vital Signs: Temp: 98.6 F (37 C) (05/18 0521) Temp src: Oral (05/18 0521) BP: 108/71 mmHg (05/18 0521) Pulse Rate: 102  (05/18 0521)  Labs:  Basename 08/18/11 0756 08/18/11 0510 08/17/11 2218  HGB -- 12.6 14.7  HCT -- 36.9 41.0  PLT -- 355 399  APTT -- -- --  LABPROT -- -- 17.6*  INR -- -- 1.42  HEPARINUNFRC 0.19* -- --  CREATININE -- 1.25* 1.39*  CKTOTAL -- -- --  CKMB -- -- --  TROPONINI -- -- --    Estimated Creatinine Clearance: 65 ml/min (by C-G formula based on Cr of 1.25).  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 Pharmacy consulted to manage IV heparin as INR 1.42. Per physician, will consider IVC filter placement and therefore Coumadin not continued at this time.  Heparin level subtherapeutic @ 0.19.  CBC ok.  No bleeding noted.    Goal of Therapy:  Heparin level 0.3-0.7 units/ml  Monitor platelets by anticoagulation protocol: Yes   Plan:  1. Increase heparin to 1600 units/hr.  2. Heparin level in 6 hours.  3. Daily CBC, heparin level  4. F/u anticoagulation plan - IVC filter vs. Coumadin restart.

## 2011-08-18 NOTE — Progress Notes (Signed)
Pt at VQ scan and is refusing to havethe scan done pt states "It will kill me and take out my soul, it will pull out my heart and my heart bands. The president made an announcement about it. Didn't you hear him?" Pt reoriented to situation, pt still refuses to have the scan Dr. Cleotis Lema notified. Orders to give IV ativan 1mg  x1, if pt continues refuse then hold off on scan. Julien Nordmann [facility]

## 2011-08-18 NOTE — H&P (Addendum)
DATE OF ADMISSION:  08/18/2011  PCP:   No primary provider on file.   Chief Complaint:  Back Pain   HPI: [name] is an 41 y.o. female complaints of low back pain after lifting a cooler at her home, and also having increased SOB, and fevers and cough for the past 3 -4 days.  She was evaluated in the ED and found to have bilateral pneumonia.  She also has a history of  Pulmonary Emboli and is currently subtherapeutic on her coumadin.  She was referred for admission.       Past Medical History  Diagnosis Date  . Pulmonary emboli   . Schizophrenia     No past surgical history on file.  Medications:  HOME MEDS: Prior to Admission medications   Medication Sig Start Date End Date Taking? Authorizing Provider  aspirin 325 MG tablet Take 325 mg by mouth daily.     Yes Historical Provider, MD  benztropine (COGENTIN) 1 MG tablet Take 1 mg by mouth.     Yes Historical Provider, MD  diazepam (VALIUM) 5 MG tablet Take 5 mg by mouth every 8 (eight) hours as needed.     Yes Historical Provider, MD  fluPHENAZine decanoate (PROLIXIN) 25 MG/ML injection Inject 25 mg into the muscle every 14 (fourteen) days.     Yes Historical Provider, MD  gabapentin (NEURONTIN) 400 MG capsule Take 400 mg by mouth 3 (three) times daily.     Yes Historical Provider, MD  HYDROcodone-acetaminophen (NORCO) 5-325 MG per tablet Take 1 tablet by mouth every 6 (six) hours as needed.     Yes Historical Provider, MD  metoprolol tartrate (LOPRESSOR) 12.5 mg TABS Take 12.5 mg by mouth 2 (two) times daily.     Yes Historical Provider, MD  pantoprazole (PROTONIX) 40 MG tablet Take 40 mg by mouth every 12 (twelve) hours.     Yes Historical Provider, MD  warfarin (COUMADIN) 5 MG tablet Take 5 mg by mouth daily.     Yes Historical Provider, MD    Allergies:  Allergies  Allergen Reactions  . Darvocet (Propoxyphene-Acetaminophen)     Social History:   reports that she has been smoking Cigarettes.  She has been smoking about  1.5 packs per day. She has never used smokeless tobacco. She reports that she does not drink alcohol or use illicit drugs.  Family History: No family history on file.  Review of Systems:  The patient denies anorexia, fever, weight loss, vision loss, decreased hearing, hoarseness, chest pain, syncope, dyspnea on exertion, peripheral edema, balance deficits, hemoptysis, abdominal pain, melena, hematochezia, severe indigestion/heartburn, hematuria, incontinence, genital sores, muscle weakness, suspicious skin lesions, transient blindness, difficulty walking, depression, unusual weight change, abnormal bleeding, enlarged lymph nodes, angioedema, and breast masses.   Physical Exam:  GEN:  Pleasantly confused Obese [name] examined  and in no acute distress; cooperative with exam Filed Vitals:   08/17/11 2206 08/17/11 2341 08/18/11 0045 08/18/11 0100  BP:  123/86 77/46 131/85  Pulse:  122 112 111  Temp: 98.6 F (37 C)     TempSrc: Oral     Resp:  26 34 26  SpO2:  94% 91% 91%   Blood pressure 131/85, pulse 111, temperature 98.6 F (37 C), temperature source Oral, resp. rate 26, last menstrual period 08/17/2011, SpO2 91.00%. PSYCH: She is alert and oriented x4; does not appear anxious does not appear depressed; affect is normal HEENT: Normocephalic and Atraumatic, Mucous membranes pink; PERRLA;  EOM intact; Fundi:  Benign;  No scleral icterus, Nares: Patent, Oropharynx: Clear, Fair Dentition, Neck:  FROM, no cervical lymphadenopathy nor thyromegaly or carotid bruit; no JVD; Breasts:: Not examined CHEST WALL: No tenderness CHEST: Normal respiration, clear to auscultation bilaterally HEART: Regular rate and rhythm; no murmurs rubs or gallops BACK: No kyphosis or scoliosis; no CVA tenderness ABDOMEN: Positive Bowel Sounds, Obese, soft non-tender; no masses, no organomegaly.   Rectal Exam: Not done EXTREMITIES: No bone or joint deformity; age-appropriate arthropathy of the  hands and knees; no cyanosis, clubbing or edema; no ulcerations. Genitalia: not examined PULSES: 2+ and symmetric SKIN: Normal hydration no rash or ulceration CNS: Cranial nerves 2-12 grossly intact no focal neurologic deficit   Labs & Imaging Results for orders placed during the hospital encounter of 08/17/11 (from the past 48 hour(s))  COMPREHENSIVE METABOLIC PANEL     Status: Abnormal   Collection Time   08/17/11 10:18 PM      Component Value Range Comment   Sodium 139  135 - 145 (mEq/L)    Potassium 3.5  3.5 - 5.1 (mEq/L)    Chloride 98  96 - 112 (mEq/L)    CO2 20  19 - 32 (mEq/L)    Glucose, Bld 118 (*) 70 - 99 (mg/dL)    BUN 31 (*) 6 - 23 (mg/dL)    Creatinine, Ser 7.82 (*) 0.50 - 1.10 (mg/dL)    Calcium 95.6  8.4 - 10.5 (mg/dL)    Total Protein 8.5 (*) 6.0 - 8.3 (g/dL)    Albumin 3.7  3.5 - 5.2 (g/dL)    AST 77 (*) 0 - 37 (U/L)    ALT 31  0 - 35 (U/L)    Alkaline Phosphatase 86  39 - 117 (U/L)    Total Bilirubin 0.8  0.3 - 1.2 (mg/dL)    GFR calc non Af Amer 46 (*) >90 (mL/min)    GFR calc Af Amer 53 (*) >90 (mL/min)   CBC     Status: Abnormal   Collection Time   08/17/11 10:18 PM      Component Value Range Comment   WBC 29.7 (*) 4.0 - 10.5 (K/uL)    RBC 4.53  3.87 - 5.11 (MIL/uL)    Hemoglobin 14.7  12.0 - 15.0 (g/dL)    HCT 21.3  08.6 - 57.8 (%)    MCV 90.5  78.0 - 100.0 (fL)    MCH 32.5  26.0 - 34.0 (pg)    MCHC 35.9  30.0 - 36.0 (g/dL)    RDW 46.9  62.9 - 52.8 (%)    Platelets 399  150 - 400 (K/uL)   DIFFERENTIAL     Status: Abnormal   Collection Time   08/17/11 10:18 PM      Component Value Range Comment   Neutrophils Relative 84 (*) 43 - 77 (%)    Lymphocytes Relative 7 (*) 12 - 46 (%)    Monocytes Relative 9  3 - 12 (%)    Eosinophils Relative 0  0 - 5 (%)    Basophils Relative 0  0 - 1 (%)    Neutro Abs 24.9 (*) 1.7 - 7.7 (K/uL)    Lymphs Abs 2.1  0.7 - 4.0 (K/uL)    Monocytes Absolute 2.7 (*) 0.1 - 1.0 (K/uL)    Eosinophils Absolute 0.0  0.0 - 0.7  (K/uL)    Basophils Absolute 0.0  0.0 - 0.1 (K/uL)    WBC Morphology ATYPICAL LYMPHOCYTES   MILD  LEFT SHIFT (1-5% METAS, OCC MYELO, OCC BANDS)  PROTIME-INR     Status: Abnormal   Collection Time   08/17/11 10:18 PM      Component Value Range Comment   Prothrombin Time 17.6 (*) 11.6 - 15.2 (seconds)    INR 1.42  0.00 - 1.49    POCT PREGNANCY, URINE     Status: Normal   Collection Time   08/17/11 11:40 PM      Component Value Range Comment   Preg Test, Ur NEGATIVE  NEGATIVE     Dg Chest 2 View  08/17/2011  *RADIOLOGY REPORT*  Clinical Data: Shortness of breath, cough.  CHEST - 2 VIEW  Comparison: 12/06/2010 CT  Findings: Apical scarring and underlying emphysematous changes. Mild lung base opacities.  Heart size upper normal limits. Mediastinal contours otherwise within normal range.  No pneumothorax or pleural effusion.  Osteopenia without definite acute osseous finding.  IMPRESSION: Bibasilar opacities; atelectasis versus pneumonia.  COPD.  Original Report Authenticated By: Waneta Martins, M.D.      Assessment: Present on Admission:  .CAP (community acquired pneumonia) .SOB (shortness of breath) .Tachycardia .Pulmonary emboli Schizophrenia Altered Mental Status  Plan:    Admit to Telemetry Bed. IV Rocephin and IV Azithromycin Albuterol Nebs, O2 IVFs Check TSH IV Heparin, V/Q scan ordered and Consider IVC Filter placement Check UDS Other plans as per orders.    CODE STATUS:      FULL CODE         Frisco Cordts C 08/18/2011, 1:17 AM

## 2011-08-18 NOTE — Progress Notes (Signed)
ANTICOAGULATION CONSULT NOTE - Initial Consult  Pharmacy Consult for Heparin Indication: H/o VTE  Allergies  Allergen Reactions  . Darvocet (Propoxyphene-Acetaminophen)     Patient Measurements: Height: 5\' 7"  (170.2 cm) Weight: 182 lb 15.7 oz (83 kg) IBW/kg (Calculated) : 61.6  Heparin Dosing Weight: 79 kg  Vital Signs: Temp: 98.5 F (36.9 C) (05/18 0144) Temp src: Oral (05/18 0144) BP: 118/78 mmHg (05/18 0144) Pulse Rate: 105  (05/18 0144)  Labs:  Alvira Philips 08/17/11 2218  HGB 14.7  HCT 41.0  PLT 399  APTT --  LABPROT 17.6*  INR 1.42  HEPARINUNFRC --  CREATININE 1.39*  CKTOTAL --  CKMB --  TROPONINI --    Estimated Creatinine Clearance: 58.4 ml/min (by C-G formula based on Cr of 1.39).   Medical History: Past Medical History  Diagnosis Date  . Pulmonary emboli   . Schizophrenia     Medications:  Scheduled:    . sodium chloride   Intravenous STAT  . albuterol  2.5 mg Nebulization Q6H  . albuterol  5 mg Nebulization Once  . azithromycin  500 mg Intravenous Once  . azithromycin  500 mg Intravenous Q24H  . cefTRIAXone (ROCEPHIN)  IV  1 g Intravenous Once  . cefTRIAXone (ROCEPHIN)  IV  1 g Intravenous Q24H  . sodium chloride  3 mL Intravenous Q12H  . DISCONTD: azithromycin  500 mg Intravenous Q24H  . 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 Pharmacy consulted to manage IV heparin as INR 1.42. Per physician, will consider IVC filter placement and therefore Coumadin not continued at this time.   Goal of Therapy:  Heparin level 0.3-0.7 units/ml Monitor platelets by anticoagulation protocol: Yes   Plan:  1. Heparin IV bolus of 2000 units x 1, then IV infusion at 1400 units/hr.  2. Heparin level in 6 hours.  3. Daily CBC, heparin level 4. F/u anticoagulation plan - IVC filter vs. Coumadin restart.  Emeline Gins 08/18/2011,1:48 AM

## 2011-08-18 NOTE — ED Notes (Signed)
Pt vomited. Helped pt clean up and put a new gown on.

## 2011-08-19 ENCOUNTER — Inpatient Hospital Stay (HOSPITAL_COMMUNITY): Payer: Medicaid Other

## 2011-08-19 DIAGNOSIS — Z8679 Personal history of other diseases of the circulatory system: Secondary | ICD-10-CM

## 2011-08-19 DIAGNOSIS — R0602 Shortness of breath: Secondary | ICD-10-CM

## 2011-08-19 DIAGNOSIS — M545 Low back pain: Secondary | ICD-10-CM

## 2011-08-19 DIAGNOSIS — F209 Schizophrenia, unspecified: Secondary | ICD-10-CM | POA: Diagnosis not present

## 2011-08-19 DIAGNOSIS — D696 Thrombocytopenia, unspecified: Secondary | ICD-10-CM

## 2011-08-19 LAB — CBC
HCT: 35.1 % — ABNORMAL LOW (ref 36.0–46.0)
Hemoglobin: 11.7 g/dL — ABNORMAL LOW (ref 12.0–15.0)
MCH: 30.7 pg (ref 26.0–34.0)
MCHC: 33.3 g/dL (ref 30.0–36.0)
RBC: 3.81 MIL/uL — ABNORMAL LOW (ref 3.87–5.11)

## 2011-08-19 LAB — HEPARIN LEVEL (UNFRACTIONATED): Heparin Unfractionated: 0.32 IU/mL (ref 0.30–0.70)

## 2011-08-19 LAB — BASIC METABOLIC PANEL
BUN: 14 mg/dL (ref 6–23)
CO2: 22 mEq/L (ref 19–32)
GFR calc non Af Amer: 65 mL/min — ABNORMAL LOW (ref >90)
Glucose, Bld: 80 mg/dL (ref 70–99)
Potassium: 3.4 mEq/L — ABNORMAL LOW (ref 3.5–5.1)
Sodium: 139 mEq/L (ref 135–145)

## 2011-08-19 MED ORDER — RISPERIDONE 2 MG PO TABS
2.0000 mg | ORAL_TABLET | Freq: Every day | ORAL | Status: DC
  Administered 2011-08-19 – 2011-08-23 (×5): 2 mg via ORAL
  Filled 2011-08-19 (×6): qty 1

## 2011-08-19 MED ORDER — POTASSIUM CHLORIDE CRYS ER 20 MEQ PO TBCR
40.0000 meq | EXTENDED_RELEASE_TABLET | ORAL | Status: AC
  Administered 2011-08-19: 40 meq via ORAL
  Filled 2011-08-19: qty 2

## 2011-08-19 MED ORDER — WARFARIN SODIUM 7.5 MG PO TABS
7.5000 mg | ORAL_TABLET | Freq: Once | ORAL | Status: AC
Start: 2011-08-19 — End: 2011-08-19
  Administered 2011-08-19: 7.5 mg via ORAL
  Filled 2011-08-19: qty 1

## 2011-08-19 NOTE — Progress Notes (Signed)
Subjective: Patient seen and examined, reported that she's feeling much better today. She is more alert and interactive today and answering questions appropriately.  Objective: Vital signs in last 24 hours: Temp:  [98.2 F (36.8 C)-98.5 F (36.9 C)] 98.5 F (36.9 C) (05/19 0400) Pulse Rate:  [80-105] 80  (05/19 0400) Resp:  [18-32] 20  (05/19 0400) BP: (93-137)/(56-83) 110/71 mmHg (05/19 0400) SpO2:  [82 %-95 %] 82 % (05/19 0400) Weight change:     Intake/Output from previous day: 05/18 0701 - 05/19 0700 In: 3311.5 [I.V.:2661.5; IV Piggyback:650] Out: -      Physical Exam: General: Alert, awake, oriented x3, in no acute distress. Heart: Regular rate and rhythm, without murmurs, rubs, gallops. Lungs: Clear to auscultation bilaterally. Abdomen: Soft, nontender, nondistended, positive bowel sounds. Extremities: No clubbing cyanosis or edema with positive pedal pulses. Neuro: Grossly intact, nonfocal.    Lab Results: Results for orders placed during the hospital encounter of 08/17/11 (from the past 24 hour(s))  HEPARIN LEVEL (UNFRACTIONATED)     Status: Normal   Collection Time   08/18/11  3:50 PM      Component Value Range   Heparin Unfractionated 0.35  0.30 - 0.70 (IU/mL)  POTASSIUM     Status: Abnormal   Collection Time   08/18/11  3:50 PM      Component Value Range   Potassium 3.3 (*) 3.5 - 5.1 (mEq/L)  MAGNESIUM     Status: Normal   Collection Time   08/18/11  3:50 PM      Component Value Range   Magnesium 2.0  1.5 - 2.5 (mg/dL)  PROTIME-INR     Status: Abnormal   Collection Time   08/19/11  5:46 AM      Component Value Range   Prothrombin Time 18.3 (*) 11.6 - 15.2 (seconds)   INR 1.49  0.00 - 1.49   HEPARIN LEVEL (UNFRACTIONATED)     Status: Normal   Collection Time   08/19/11  5:46 AM      Component Value Range   Heparin Unfractionated 0.32  0.30 - 0.70 (IU/mL)  CBC     Status: Abnormal   Collection Time   08/19/11  5:46 AM      Component Value Range   WBC 11.8 (*) 4.0 - 10.5 (K/uL)   RBC 3.81 (*) 3.87 - 5.11 (MIL/uL)   Hemoglobin 11.7 (*) 12.0 - 15.0 (g/dL)   HCT 16.1 (*) 09.6 - 46.0 (%)   MCV 92.1  78.0 - 100.0 (fL)   MCH 30.7  26.0 - 34.0 (pg)   MCHC 33.3  30.0 - 36.0 (g/dL)   RDW 04.5  40.9 - 81.1 (%)   Platelets 343  150 - 400 (K/uL)  BASIC METABOLIC PANEL     Status: Abnormal   Collection Time   08/19/11  5:46 AM      Component Value Range   Sodium 139  135 - 145 (mEq/L)   Potassium 3.4 (*) 3.5 - 5.1 (mEq/L)   Chloride 103  96 - 112 (mEq/L)   CO2 22  19 - 32 (mEq/L)   Glucose, Bld 80  70 - 99 (mg/dL)   BUN 14  6 - 23 (mg/dL)   Creatinine, Ser 9.14  0.50 - 1.10 (mg/dL)   Calcium 8.8  8.4 - 78.2 (mg/dL)   GFR calc non Af Amer 65 (*) >90 (mL/min)   GFR calc Af Amer 76 (*) >90 (mL/min)  MAGNESIUM     Status: Normal  Collection Time   08/19/11  5:46 AM      Component Value Range   Magnesium 2.1  1.5 - 2.5 (mg/dL)    Studies/Results: Dg Chest 2 View  08/17/2011  *RADIOLOGY REPORT*  Clinical Data: Shortness of breath, cough.  CHEST - 2 VIEW  Comparison: 12/06/2010 CT  Findings: Apical scarring and underlying emphysematous changes. Mild lung base opacities.  Heart size upper normal limits. Mediastinal contours otherwise within normal range.  No pneumothorax or pleural effusion.  Osteopenia without definite acute osseous finding.  IMPRESSION: Bibasilar opacities; atelectasis versus pneumonia.  COPD.  Original Report Authenticated By: Waneta Martins, M.D.    Medications:    . albuterol  5 mg Nebulization Once  . antiseptic oral rinse  15 mL Mouth Rinse BID  . azithromycin  500 mg Intravenous Q24H  . benztropine  1 mg Oral Daily  . cefTRIAXone (ROCEPHIN)  IV  1 g Intravenous Q24H  . fluPHENAZine decanoate  25 mg Intramuscular Q14 Days  . gabapentin  400 mg Oral TID  . LORazepam  1 mg Intravenous Once  . metoprolol tartrate  12.5 mg Oral BID  . pantoprazole  40 mg Oral Q12H  . potassium chloride  10 mEq Intravenous Q1  Hr x 4  . potassium chloride  40 mEq Oral Once  . sodium chloride  3 mL Intravenous Q12H  . warfarin  7.5 mg Oral ONCE-1800  . Warfarin - Pharmacist Dosing Inpatient   Does not apply q1800  . DISCONTD: albuterol  2.5 mg Nebulization Q6H    acetaminophen, acetaminophen, albuterol, alum & mag hydroxide-simeth, diazepam, HYDROcodone-acetaminophen, HYDROmorphone (DILAUDID) injection, ondansetron (ZOFRAN) IV, ondansetron, zolpidem, DISCONTD: oxyCODONE     . sodium chloride 100 mL/hr at 08/18/11 0934  . heparin 1,600 Units/hr (08/18/11 1740)    Assessment/Plan: 1/community-acquired pneumonia:  -Continue antibiotics, nebs when necessary  2/history of pulmonary emboli:  -Patient was not taking her coumadin for the last 4 days PTA. -Subtherapeutic INR noted, continue heparin infusion and Coumadin as per pharmacy  -V/Q scan pending .Patient was agitated yesterday and refused to have the test done even after given ativan ,will postpone  till she is more cooperative. 3/hypokalemia:  -Replete , magnesium level normal . Monitor on cardiac monitor.  4/lower back pain:  -Patient reported started after she lifted a cooler at home  -Normal neurological exam, continue pain meds when necessary. lumbar spine x-ray however patient not cooperative .  5/AKI:  -Improving with IV fluids to continue, probably related to prerenal azotemia  6/sinus tachycardia:  -TSH pending , VQ scan is pending. Continue metoprolol  7/history of schizophrenia:  - appreciated Dr Oretha Ellis input ,I spoke to him today and he recommended to d/c cogentin and Prolixin and start risperdone 2mg  qhs first dose now.    LOS: 2 days   [name] 08/19/2011, 8:44 AM

## 2011-08-19 NOTE — Progress Notes (Signed)
ANTICOAGULATION CONSULT NOTE - Follow Up Consult  Pharmacy Consult for Heparin and Coumadin Indication: Hx PE  Allergies  Allergen Reactions  . Darvocet (Propoxyphene-Acetaminophen)     Patient Measurements: Height: 5\' 7"  (170.2 cm) Weight: 182 lb 15.7 oz (83 kg) IBW/kg (Calculated) : 61.6  Heparin Dosing Weight: 79kg  Vital Signs: Temp: 98.3 F (36.8 C) (05/19 1430) Temp src: Oral (05/19 1430) BP: 106/65 mmHg (05/19 1430) Pulse Rate: 73  (05/19 1430)  Labs:  Basename 08/19/11 0546 08/18/11 1550 08/18/11 0756 08/18/11 0510 08/17/11 2218  HGB 11.7* -- -- 12.6 --  HCT 35.1* -- -- 36.9 41.0  PLT 343 -- -- 355 399  APTT -- -- -- -- --  LABPROT 18.3* -- -- -- 17.6*  INR 1.49 -- -- -- 1.42  HEPARINUNFRC 0.32 0.35 0.19* -- --  CREATININE 1.04 -- -- 1.25* 1.39*  CKTOTAL -- -- -- -- --  CKMB -- -- -- -- --  TROPONINI -- -- -- -- --    Estimated Creatinine Clearance: 78.1 ml/min (by C-G formula based on Cr of 1.04).   Medications:  Heparin 1600 units/hr  Assessment: 41yof on Heparin bridging to Coumadin for h/o PE with subtherapeutic INR. Heparin level (0.32) remains therapeutic but has trended down  - will increase rate slightly and follow-up AM level. INR (1.49) is subtherapeutic. Patient reported not taking Coumadin dose the 4 days prior to admission and was unsure of Coumadin home regimen. Med Rec and most recent discharge summary lists 5mg  daily.  - H/H and Plts trended down - No significant bleeding reported  Goal of Therapy:  INR 2-3 Heparin level 0.3-0.7 units/ml Monitor platelets by anticoagulation protocol: Yes   Plan:  1. Increase heparin drip to 1650 units/hr (16.5 ml/hr) 2. Repeat Coumadin 7.5mg  po x 1 today 3. Follow-up AM heparin level and INR  Cleon Dew [phone] 08/19/2011,3:42 PM

## 2011-08-19 NOTE — Progress Notes (Signed)
Per Dr. Cleotis Lema, pt may travel off tele, but either an RN or CNA MUST accompany pt due to the hallucinations that pt is experiencing.  Oncoming RN made aware of this also.

## 2011-08-19 NOTE — Progress Notes (Signed)
Sitter asked pt if she needed to use the restroom. She has not used restroom in 7 hours. Pt stated she just wanted to sleep. Will continue to monitor.

## 2011-08-19 NOTE — Consult Note (Signed)
Reason for Consult: Schizophrenia, capacity Referring Physician: Dr. Porfirio [name] is an 41 y.o. female.  HPI: [name] is an 41 y.o. female complaints of low back pain after lifting a cooler at her home, and also having increased SOB, and fevers and cough for the past 3 -4 days. She was evaluated in the ED and found to have bilateral pneumonia. She also has a history of Pulmonary Emboli and is currently subtherapeutic on her coumadin.   Interval Hx:  Was able to recognize me. More calmer, more talkative. Family happy with her progress. Pt and family not interested for in pt psy after medical clearance but wants to go for out pt psy follow up. Able to tolerate her meds now. Pt was able to smile and talk more today. Willing to take risperdione and see out pt psy. Per family pt getting close her baseline now.   Past Medical History  Diagnosis Date  . Pulmonary emboli   . Schizophrenia     Past Surgical History  Procedure Date  . No past surgeries     History reviewed. No pertinent family history.  Social History:  reports that she has been smoking Cigarettes.  She has a 37.5 pack-year smoking history. She has never used smokeless tobacco. She reports that she does not drink alcohol or use illicit drugs.  Allergies:  Allergies  Allergen Reactions  . Darvocet (Propoxyphene-Acetaminophen)     Medications: I have reviewed the patient's current medications.  Results for orders placed during the hospital encounter of 08/17/11 (from the past 48 hour(s))  COMPREHENSIVE METABOLIC PANEL     Status: Abnormal   Collection Time   08/17/11 10:18 PM      Component Value Range Comment   Sodium 139  135 - 145 (mEq/L)    Potassium 3.5  3.5 - 5.1 (mEq/L)    Chloride 98  96 - 112 (mEq/L)    CO2 20  19 - 32 (mEq/L)    Glucose, Bld 118 (*) 70 - 99 (mg/dL)    BUN 31 (*) 6 - 23 (mg/dL)    Creatinine, Ser 3.32 (*) 0.50 - 1.10 (mg/dL)    Calcium 95.1  8.4 - 10.5 (mg/dL)    Total  Protein 8.5 (*) 6.0 - 8.3 (g/dL)    Albumin 3.7  3.5 - 5.2 (g/dL)    AST 77 (*) 0 - 37 (U/L)    ALT 31  0 - 35 (U/L)    Alkaline Phosphatase 86  39 - 117 (U/L)    Total Bilirubin 0.8  0.3 - 1.2 (mg/dL)    GFR calc non Af Amer 46 (*) >90 (mL/min)    GFR calc Af Amer 53 (*) >90 (mL/min)   CBC     Status: Abnormal   Collection Time   08/17/11 10:18 PM      Component Value Range Comment   WBC 29.7 (*) 4.0 - 10.5 (K/uL)    RBC 4.53  3.87 - 5.11 (MIL/uL)    Hemoglobin 14.7  12.0 - 15.0 (g/dL)    HCT 88.4  16.6 - 06.3 (%)    MCV 90.5  78.0 - 100.0 (fL)    MCH 32.5  26.0 - 34.0 (pg)    MCHC 35.9  30.0 - 36.0 (g/dL)    RDW 01.6  01.0 - 93.2 (%)    Platelets 399  150 - 400 (K/uL)   DIFFERENTIAL     Status: Abnormal   Collection Time   08/17/11  10:18 PM      Component Value Range Comment   Neutrophils Relative 84 (*) 43 - 77 (%)    Lymphocytes Relative 7 (*) 12 - 46 (%)    Monocytes Relative 9  3 - 12 (%)    Eosinophils Relative 0  0 - 5 (%)    Basophils Relative 0  0 - 1 (%)    Neutro Abs 24.9 (*) 1.7 - 7.7 (K/uL)    Lymphs Abs 2.1  0.7 - 4.0 (K/uL)    Monocytes Absolute 2.7 (*) 0.1 - 1.0 (K/uL)    Eosinophils Absolute 0.0  0.0 - 0.7 (K/uL)    Basophils Absolute 0.0  0.0 - 0.1 (K/uL)    WBC Morphology ATYPICAL LYMPHOCYTES   MILD LEFT SHIFT (1-5% METAS, OCC MYELO, OCC BANDS)  PROTIME-INR     Status: Abnormal   Collection Time   08/17/11 10:18 PM      Component Value Range Comment   Prothrombin Time 17.6 (*) 11.6 - 15.2 (seconds)    INR 1.42  0.00 - 1.49    POCT PREGNANCY, URINE     Status: Normal   Collection Time   08/17/11 11:40 PM      Component Value Range Comment   Preg Test, Ur NEGATIVE  NEGATIVE    MRSA PCR SCREENING     Status: Normal   Collection Time   08/18/11  2:51 AM      Component Value Range Comment   MRSA by PCR NEGATIVE  NEGATIVE    BASIC METABOLIC PANEL     Status: Abnormal   Collection Time   08/18/11  5:10 AM      Component Value Range Comment   Sodium 136   135 - 145 (mEq/L)    Potassium 2.6 (*) 3.5 - 5.1 (mEq/L)    Chloride 96  96 - 112 (mEq/L)    CO2 23  19 - 32 (mEq/L)    Glucose, Bld 87  70 - 99 (mg/dL)    BUN 27 (*) 6 - 23 (mg/dL)    Creatinine, Ser 4.09 (*) 0.50 - 1.10 (mg/dL)    Calcium 9.0  8.4 - 10.5 (mg/dL)    GFR calc non Af Amer 52 (*) >90 (mL/min)    GFR calc Af Amer 61 (*) >90 (mL/min)   CBC     Status: Abnormal   Collection Time   08/18/11  5:10 AM      Component Value Range Comment   WBC 24.0 (*) 4.0 - 10.5 (K/uL)    RBC 4.02  3.87 - 5.11 (MIL/uL)    Hemoglobin 12.6  12.0 - 15.0 (g/dL)    HCT 81.1  91.4 - 78.2 (%)    MCV 91.8  78.0 - 100.0 (fL)    MCH 31.3  26.0 - 34.0 (pg)    MCHC 34.1  30.0 - 36.0 (g/dL)    RDW 95.6  21.3 - 08.6 (%)    Platelets 355  150 - 400 (K/uL)   HEPARIN LEVEL (UNFRACTIONATED)     Status: Abnormal   Collection Time   08/18/11  7:56 AM      Component Value Range Comment   Heparin Unfractionated 0.19 (*) 0.30 - 0.70 (IU/mL)   MAGNESIUM     Status: Normal   Collection Time   08/18/11  7:56 AM      Component Value Range Comment   Magnesium 2.1  1.5 - 2.5 (mg/dL)   HEPARIN LEVEL (UNFRACTIONATED)     Status:  Normal   Collection Time   08/18/11  3:50 PM      Component Value Range Comment   Heparin Unfractionated 0.35  0.30 - 0.70 (IU/mL)   POTASSIUM     Status: Abnormal   Collection Time   08/18/11  3:50 PM      Component Value Range Comment   Potassium 3.3 (*) 3.5 - 5.1 (mEq/L)   MAGNESIUM     Status: Normal   Collection Time   08/18/11  3:50 PM      Component Value Range Comment   Magnesium 2.0  1.5 - 2.5 (mg/dL)   PROTIME-INR     Status: Abnormal   Collection Time   08/19/11  5:46 AM      Component Value Range Comment   Prothrombin Time 18.3 (*) 11.6 - 15.2 (seconds)    INR 1.49  0.00 - 1.49    HEPARIN LEVEL (UNFRACTIONATED)     Status: Normal   Collection Time   08/19/11  5:46 AM      Component Value Range Comment   Heparin Unfractionated 0.32  0.30 - 0.70 (IU/mL)   CBC     Status:  Abnormal   Collection Time   08/19/11  5:46 AM      Component Value Range Comment   WBC 11.8 (*) 4.0 - 10.5 (K/uL)    RBC 3.81 (*) 3.87 - 5.11 (MIL/uL)    Hemoglobin 11.7 (*) 12.0 - 15.0 (g/dL)    HCT 40.9 (*) 81.1 - 46.0 (%)    MCV 92.1  78.0 - 100.0 (fL)    MCH 30.7  26.0 - 34.0 (pg)    MCHC 33.3  30.0 - 36.0 (g/dL)    RDW 91.4  78.2 - 95.6 (%)    Platelets 343  150 - 400 (K/uL)   BASIC METABOLIC PANEL     Status: Abnormal   Collection Time   08/19/11  5:46 AM      Component Value Range Comment   Sodium 139  135 - 145 (mEq/L)    Potassium 3.4 (*) 3.5 - 5.1 (mEq/L)    Chloride 103  96 - 112 (mEq/L)    CO2 22  19 - 32 (mEq/L)    Glucose, Bld 80  70 - 99 (mg/dL)    BUN 14  6 - 23 (mg/dL)    Creatinine, Ser 2.13  0.50 - 1.10 (mg/dL)    Calcium 8.8  8.4 - 10.5 (mg/dL)    GFR calc non Af Amer 65 (*) >90 (mL/min)    GFR calc Af Amer 76 (*) >90 (mL/min)   MAGNESIUM     Status: Normal   Collection Time   08/19/11  5:46 AM      Component Value Range Comment   Magnesium 2.1  1.5 - 2.5 (mg/dL)     Dg Chest 2 View  0/86/5784  *RADIOLOGY REPORT*  Clinical Data: Shortness of breath, cough.  CHEST - 2 VIEW  Comparison: 12/06/2010 CT  Findings: Apical scarring and underlying emphysematous changes. Mild lung base opacities.  Heart size upper normal limits. Mediastinal contours otherwise within normal range.  No pneumothorax or pleural effusion.  Osteopenia without definite acute osseous finding.  IMPRESSION: Bibasilar opacities; atelectasis versus pneumonia.  COPD.  Original Report Authenticated By: Waneta Martins, M.D.    ROS  Blood pressure 100/55, pulse 78, temperature 98.6 F (37 C), temperature source Oral, resp. rate 18, height 5\' 7"  (1.702 m), weight 83 kg (182 lb 15.7 oz), last menstrual  period 08/17/2011, SpO2 92.00%. Physical Exam   Alert, on bed  Mental Status Examination/Evaluation: ; oriented to person, place only Objective: Appearance: Fairly Groomed  Psychomotor  Activity: Normal  Eye Contact:: fair Speech: soft Volume: Normal  Mood: upset ay time Affect: ristricted Thought Process: disorganized at time, getting better Orientation: Full  Thought Content: no AVH, no si, no hi, Suicidal Thoughts: No  Homicidal Thoughts: No  Judgement: Impaired  Insight: poor  DIAGNOSIS:   AXIS I  schizphrenia AXIS II  def AXIS III  See medical history.  AXIS IV  unknown AXIS V  40   Treatment Plan Summary:   1.  Will recommend out pt psy follow up. I told family to make f/u with mental health as pt used to go there before. Family agreed. SW can help with that. Pt and family not interested for inpt psy now   2. Will sign off now and plz re-consult if needed  Wonda Cerise 08/19/2011, 2:18 PM

## 2011-08-20 ENCOUNTER — Inpatient Hospital Stay (HOSPITAL_COMMUNITY): Payer: Medicaid Other

## 2011-08-20 DIAGNOSIS — M545 Low back pain: Secondary | ICD-10-CM

## 2011-08-20 DIAGNOSIS — F209 Schizophrenia, unspecified: Secondary | ICD-10-CM

## 2011-08-20 DIAGNOSIS — Z8679 Personal history of other diseases of the circulatory system: Secondary | ICD-10-CM

## 2011-08-20 DIAGNOSIS — D696 Thrombocytopenia, unspecified: Secondary | ICD-10-CM

## 2011-08-20 LAB — CBC
HCT: 31.3 % — ABNORMAL LOW (ref 36.0–46.0)
Hemoglobin: 10.2 g/dL — ABNORMAL LOW (ref 12.0–15.0)
MCV: 93.4 fL (ref 78.0–100.0)
RDW: 15.6 % — ABNORMAL HIGH (ref 11.5–15.5)
WBC: 9.2 10*3/uL (ref 4.0–10.5)

## 2011-08-20 LAB — BASIC METABOLIC PANEL
BUN: 8 mg/dL (ref 6–23)
Calcium: 8 mg/dL — ABNORMAL LOW (ref 8.4–10.5)
Chloride: 108 mEq/L (ref 96–112)
Creatinine, Ser: 0.94 mg/dL (ref 0.50–1.10)
GFR calc Af Amer: 86 mL/min — ABNORMAL LOW (ref >90)
GFR calc non Af Amer: 74 mL/min — ABNORMAL LOW (ref >90)
Potassium: 3.4 mEq/L — ABNORMAL LOW (ref 3.5–5.1)
Sodium: 140 mEq/L (ref 135–145)

## 2011-08-20 LAB — PROTIME-INR: INR: 1.71 — ABNORMAL HIGH (ref 0.00–1.49)

## 2011-08-20 LAB — HEPARIN LEVEL (UNFRACTIONATED): Heparin Unfractionated: 0.18 IU/mL — ABNORMAL LOW (ref 0.30–0.70)

## 2011-08-20 MED ORDER — POTASSIUM CHLORIDE CRYS ER 20 MEQ PO TBCR
40.0000 meq | EXTENDED_RELEASE_TABLET | ORAL | Status: AC
  Administered 2011-08-20 (×2): 40 meq via ORAL
  Filled 2011-08-20 (×2): qty 2

## 2011-08-20 MED ORDER — WARFARIN SODIUM 7.5 MG PO TABS
7.5000 mg | ORAL_TABLET | Freq: Once | ORAL | Status: AC
Start: 2011-08-20 — End: 2011-08-20
  Administered 2011-08-20: 7.5 mg via ORAL
  Filled 2011-08-20: qty 1

## 2011-08-20 NOTE — Consult Note (Signed)
Clinical Social Work  PSYCHIATRY SERVICE LINE 08/20/2011  Patient:  [name]  Account:  [id number]  Admit Date:  08/17/2011  Clinical Social Worker:  Ashley Jacobs, LCSW  Date/Time:  08/20/2011 10:45 AM  Review of Patient  Overall Medical Condition:   Patient seen over weekend by consulting MD and signed off with follow up outpatient.    Met with patient at the bedside. Patient alert and oriented x4.  Patient reports her neck is very stiff but other than that she is doing well and she is ready to go home. patient reports she follows up with her urgent care MD Cala Bradford. CSW educated patient regarding importance of Psych MD or counselor but patient reports her MD does a great job and she is not interested in anyone else. Reports she will like resources in case something comes up, but she is happy with her care.  Patient reports she had HH come into her home with [facility] to check her coumadin levels and would not mind having that re-started.  Patient is calm, cooperative, and able to answer questions appropriately.  Patient  has a flat affect, but denies depression.  Reports she has good support at home with her boyfriend of 9 years and a son and other family members. No safety concerns at this time.   Participation Level:  Active  Participation Quality  Appropriate   Other Participation Quality:   Affect  Flat   Cognitive  Oriented  Appropriate  Alert   Reaction to Medications/Concerns:   Patient has been compliant with medications and doing well.   Modes of Intervention  Behaviors/Psychosis  Education  Support   Summary of Progress/Plan at Discharge   Attempted to arrange outpatient follow up for patient, but patient declined.  Family and patient aware of mental health facility: [facility] and will make appointment for patient.  Will also place information on dc paperwork and crisis line, just in case needed.    patient has sitter, MD will need to address at dc.  No  safety concerns noted at this time.    Will sign off.  If needs arise please reconsult or call.    Ashley Jacobs, MSW LCSW [phone]
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
## 2011-08-20 NOTE — Progress Notes (Addendum)
ANTICOAGULATION CONSULT NOTE - Follow Up Consult  Pharmacy Consult for Heparin  Indication: Hx PE  Allergies  Allergen Reactions  . Darvocet (Propoxyphene-Acetaminophen)     Patient Measurements: Height: 5\' 7"  (170.2 cm) Weight: 182 lb 15.7 oz (83 kg) IBW/kg (Calculated) : 61.6  Heparin Dosing Weight: 79kg  Vital Signs: Temp: 98.1 F (36.7 C) (05/20 0511) Temp src: Oral (05/20 0511) BP: 100/61 mmHg (05/20 0511) Pulse Rate: 75  (05/20 0511)  Labs:  Basename 08/20/11 0530 08/19/11 0546 08/18/11 1550 08/18/11 0510 08/17/11 2218  HGB 10.2* 11.7* -- -- --  HCT 31.3* 35.1* -- 36.9 --  PLT 292 343 -- 355 --  APTT -- -- -- -- --  LABPROT 20.4* 18.3* -- -- 17.6*  INR 1.71* 1.49 -- -- 1.42  HEPARINUNFRC 0.18* 0.32 0.35 -- --  CREATININE 0.94 1.04 -- 1.25* --  CKTOTAL -- -- -- -- --  CKMB -- -- -- -- --  TROPONINI -- -- -- -- --    Estimated Creatinine Clearance: 86.4 ml/min (by C-G formula based on Cr of 0.94).  Assessment: 41 yo female with h/o PE for anticoagulation.  RN reports infusion was off for approximately 30-45 minutes earlier this morning.  Goal of Therapy:  INR 2-3 Heparin level 0.3-0.7 units/ml Monitor platelets by anticoagulation protocol: Yes   Plan:  Continue Heparin at current rate for now.  Recheck level later this morning. Would patient be candidate for Lovenox for bridge treatment?  Eddie Candle 08/20/2011,6:23 AM

## 2011-08-20 NOTE — Progress Notes (Signed)
ANTICOAGULATION CONSULT NOTE - Follow Up Consult  Pharmacy Consult for heparin + coumadin Indication: hx PE  Allergies  Allergen Reactions  . Darvocet (Propoxyphene-Acetaminophen)     Patient Measurements: Height: 5\' 7"  (170.2 cm) Weight: 182 lb 15.7 oz (83 kg) IBW/kg (Calculated) : 61.6  Heparin Dosing Weight: 79kg  Vital Signs: Temp: 98 F (36.7 C) (05/20 0957) Temp src: Oral (05/20 0957) BP: 108/70 mmHg (05/20 0957) Pulse Rate: 87  (05/20 0957)  Labs:  Basename 08/20/11 1210 08/20/11 0530 08/19/11 0546 08/18/11 0510 08/17/11 2218  HGB -- 10.2* 11.7* -- --  HCT -- 31.3* 35.1* 36.9 --  PLT -- 292 343 355 --  APTT -- -- -- -- --  LABPROT -- 20.4* 18.3* -- 17.6*  INR -- 1.71* 1.49 -- 1.42  HEPARINUNFRC 0.42 0.18* 0.32 -- --  CREATININE -- 0.94 1.04 1.25* --  CKTOTAL -- -- -- -- --  CKMB -- -- -- -- --  TROPONINI -- -- -- -- --    Estimated Creatinine Clearance: 86.4 ml/min (by C-G formula based on Cr of 0.94).  Medications:  Infusions:    . sodium chloride 100 mL/hr at 08/19/11 1800  . heparin 1,650 Units/hr (08/20/11 1610)   Assessment: 41 yof anticoagulated on heparin + coumadin for history of PE. INR remains subtherapeutic at 1.71 but is increasing nicely. Heparin level re-check is at goal at 0.42 after subtherapeutic this AM (likely due to heparin being off for 30-37minutes this AM). No bleeding noted. H/H and plts trending down slightly, will monitor.   Goal of Therapy:  INR 2-3 Heparin level 0.3-0.7 units/ml Monitor platelets by anticoagulation protocol: Yes   Plan:  1. Coumadin 7.5mg  PO x 1 tonight 2. Continue heparin at 1650 units/hr 3. F/u AM heparin level and INR  [name], Drake Leach 08/20/2011,12:50 PM

## 2011-08-21 DIAGNOSIS — Z8679 Personal history of other diseases of the circulatory system: Secondary | ICD-10-CM

## 2011-08-21 DIAGNOSIS — R0602 Shortness of breath: Secondary | ICD-10-CM

## 2011-08-21 DIAGNOSIS — D696 Thrombocytopenia, unspecified: Secondary | ICD-10-CM

## 2011-08-21 DIAGNOSIS — M545 Low back pain: Secondary | ICD-10-CM

## 2011-08-21 DIAGNOSIS — F200 Paranoid schizophrenia: Secondary | ICD-10-CM

## 2011-08-21 LAB — CBC
MCH: 31.2 pg (ref 26.0–34.0)
MCV: 93.4 fL (ref 78.0–100.0)
Platelets: 335 10*3/uL (ref 150–400)
RDW: 15.5 % (ref 11.5–15.5)

## 2011-08-21 LAB — PROTIME-INR
INR: 2.19 — ABNORMAL HIGH (ref 0.00–1.49)
Prothrombin Time: 24.7 seconds — ABNORMAL HIGH (ref 11.6–15.2)

## 2011-08-21 LAB — BASIC METABOLIC PANEL
Chloride: 105 mEq/L (ref 96–112)
GFR calc Af Amer: 84 mL/min — ABNORMAL LOW (ref >90)
GFR calc non Af Amer: 73 mL/min — ABNORMAL LOW (ref >90)
Potassium: 3.7 mEq/L (ref 3.5–5.1)
Sodium: 139 mEq/L (ref 135–145)

## 2011-08-21 LAB — GLUCOSE, CAPILLARY: Glucose-Capillary: 81 mg/dL (ref 70–99)

## 2011-08-21 MED ORDER — METHOCARBAMOL 500 MG PO TABS
500.0000 mg | ORAL_TABLET | Freq: Three times a day (TID) | ORAL | Status: DC | PRN
  Administered 2011-08-21 – 2011-08-24 (×6): 500 mg via ORAL
  Filled 2011-08-21 (×6): qty 1

## 2011-08-21 MED ORDER — HYDROMORPHONE HCL PF 1 MG/ML IJ SOLN: 0.5000 mg | Freq: Two times a day (BID) | INTRAMUSCULAR | Status: DC | PRN

## 2011-08-21 MED ORDER — HYDROCODONE-ACETAMINOPHEN 5-325 MG PO TABS
1.0000 | ORAL_TABLET | ORAL | Status: DC | PRN
  Administered 2011-08-21 – 2011-08-22 (×3): 2 via ORAL
  Filled 2011-08-21 (×3): qty 2

## 2011-08-21 MED ORDER — WARFARIN SODIUM 5 MG PO TABS
5.0000 mg | ORAL_TABLET | Freq: Once | ORAL | Status: AC
  Administered 2011-08-21: 5 mg via ORAL
  Filled 2011-08-21: qty 1
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
## 2011-08-21 NOTE — Plan of Care (Signed)
Problem: Phase I Progression Outcomes Goal: Initial discharge plan identified Outcome: Completed/Met Date Met:  08/21/11 To return home with boyfriend

## 2011-08-21 NOTE — Plan of Care (Signed)
Problem: Phase II Progression Outcomes Goal: Discharge plan established Outcome: Completed/Met Date Met:  08/21/11 To return home with H/H

## 2011-08-21 NOTE — Progress Notes (Signed)
ANTICOAGULATION CONSULT NOTE - Follow Up Consult  Pharmacy Consult for heparin + coumadin Indication: hx PE  Allergies  Allergen Reactions  . Darvocet (Propoxyphene-Acetaminophen)     Patient Measurements: Height: 5\' 7"  (170.2 cm) Weight: 182 lb 15.7 oz (83 kg) IBW/kg (Calculated) : 61.6  Heparin Dosing Weight: 79kg  Vital Signs: Temp: 98.5 F (36.9 C) (05/21 0542) Temp src: Oral (05/21 0542) BP: 120/72 mmHg (05/21 0542) Pulse Rate: 83  (05/21 0542)  Labs:  Basename 08/21/11 0507 08/20/11 1210 08/20/11 0530 08/19/11 0546  HGB 10.9* -- 10.2* --  HCT 32.6* -- 31.3* 35.1*  PLT 335 -- 292 343  APTT -- -- -- --  LABPROT 24.7* -- 20.4* 18.3*  INR 2.19* -- 1.71* 1.49  HEPARINUNFRC 0.51 0.42 0.18* --  CREATININE 0.95 -- 0.94 1.04  CKTOTAL -- -- -- --  CKMB -- -- -- --  TROPONINI -- -- -- --    Estimated Creatinine Clearance: 85.5 ml/min (by C-G formula based on Cr of 0.95).  Medications:  Infusions:    . sodium chloride 100 mL/hr at 08/21/11 0601  . heparin 1,650 Units/hr (08/20/11 2153)   Assessment: 41 yof anticoagulated on heparin + coumadin for history of PE. INR is therapeutic today at 2.19. Heparin is also therapeutic at 0.51. CBC is stable. No bleeding noted.   Goal of Therapy:  INR 2-3 Heparin level 0.3-0.7 units/ml Monitor platelets by anticoagulation protocol: Yes   Plan:  1. Coumadin 5mg  PO x 1 tonight 2. F/u AM INR 3. Continue heparin gtt at 1650 units/hr 4. Consider DC heparin gtt  Deneene Tarver, Drake Leach 08/21/2011,8:48 AM
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
## 2011-08-22 ENCOUNTER — Inpatient Hospital Stay (HOSPITAL_COMMUNITY): Payer: Medicaid Other

## 2011-08-22 DIAGNOSIS — J189 Pneumonia, unspecified organism: Principal | ICD-10-CM

## 2011-08-22 DIAGNOSIS — Z8679 Personal history of other diseases of the circulatory system: Secondary | ICD-10-CM

## 2011-08-22 DIAGNOSIS — R0602 Shortness of breath: Secondary | ICD-10-CM

## 2011-08-22 DIAGNOSIS — D696 Thrombocytopenia, unspecified: Secondary | ICD-10-CM

## 2011-08-22 DIAGNOSIS — M545 Low back pain: Secondary | ICD-10-CM

## 2011-08-22 LAB — CBC
HCT: 33.6 % — ABNORMAL LOW (ref 36.0–46.0)
MCHC: 33.3 g/dL (ref 30.0–36.0)
MCV: 92.3 fL (ref 78.0–100.0)
Platelets: 319 10*3/uL (ref 150–400)
RDW: 15.4 % (ref 11.5–15.5)
WBC: 12.6 10*3/uL — ABNORMAL HIGH (ref 4.0–10.5)

## 2011-08-22 LAB — PROTIME-INR: INR: 2.59 — ABNORMAL HIGH (ref 0.00–1.49)

## 2011-08-22 LAB — HEPARIN LEVEL (UNFRACTIONATED): Heparin Unfractionated: 0.38 IU/mL (ref 0.30–0.70)

## 2011-08-22 LAB — PRO B NATRIURETIC PEPTIDE: Pro B Natriuretic peptide (BNP): 2361 pg/mL — ABNORMAL HIGH (ref 0–125)

## 2011-08-22 MED ORDER — HYDROCODONE-ACETAMINOPHEN 5-325 MG PO TABS
1.0000 | ORAL_TABLET | Freq: Four times a day (QID) | ORAL | Status: DC | PRN
  Administered 2011-08-22 – 2011-08-23 (×3): 2 via ORAL
  Filled 2011-08-22 (×3): qty 2

## 2011-08-22 MED ORDER — BISACODYL 5 MG PO TBEC
5.0000 mg | DELAYED_RELEASE_TABLET | Freq: Every day | ORAL | Status: DC | PRN
  Administered 2011-08-22 – 2011-08-24 (×2): 5 mg via ORAL
  Filled 2011-08-22 (×2): qty 1

## 2011-08-22 MED ORDER — BISACODYL 10 MG RE SUPP: 10.0000 mg | Freq: Every day | RECTAL | Status: DC | PRN

## 2011-08-22 MED ORDER — XENON XE 133 GAS
18.0000 | GAS_FOR_INHALATION | Freq: Once | RESPIRATORY_TRACT | Status: AC | PRN
  Administered 2011-08-22: 18 via RESPIRATORY_TRACT

## 2011-08-22 MED ORDER — FLEET ENEMA 7-19 GM/118ML RE ENEM
1.0000 | ENEMA | RECTAL | Status: DC | PRN
  Filled 2011-08-22: qty 1

## 2011-08-22 MED ORDER — TECHNETIUM TO 99M ALBUMIN AGGREGATED
3.0000 | Freq: Once | INTRAVENOUS | Status: AC | PRN
  Administered 2011-08-22: 3 via INTRAVENOUS

## 2011-08-22 MED ORDER — FUROSEMIDE 10 MG/ML IJ SOLN
40.0000 mg | Freq: Every day | INTRAMUSCULAR | Status: DC
  Administered 2011-08-22 – 2011-08-23 (×2): 40 mg via INTRAVENOUS
  Filled 2011-08-22 (×2): qty 4

## 2011-08-22 MED ORDER — WARFARIN SODIUM 5 MG PO TABS
5.0000 mg | ORAL_TABLET | Freq: Once | ORAL | Status: AC
  Administered 2011-08-22: 5 mg via ORAL
  Filled 2011-08-22: qty 1

## 2011-08-22 MED ORDER — POLYETHYLENE GLYCOL 3350 17 G PO PACK
17.0000 g | PACK | Freq: Every day | ORAL | Status: DC | PRN
  Administered 2011-08-23: 17 g via ORAL
  Filled 2011-08-22: qty 1
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
## 2011-08-22 NOTE — Progress Notes (Signed)
O2 stat at rest no oxygen= 89% Walking O2 stat with no oxygen= 80-82% Placed patient on 2L stating at 90%.

## 2011-08-22 NOTE — Progress Notes (Signed)
ANTICOAGULATION CONSULT NOTE - Follow Up Consult  Pharmacy Consult for heparin + coumadin Indication: hx PE  Assessment: 41 yof anticoagulated on heparin + coumadin for history of PE. INR is therapeutic today at 2.59. Heparin is also therapeutic. CBC is stable. No bleeding noted.   Goal of Therapy:  INR 2-3 Heparin level 0.3-0.7 units/ml   Plan:  1. Coumadin 5mg  PO x 1 tonight 2. F/u AM INR 3. Please DC heparin   Allergies  Allergen Reactions  . Darvocet (Propoxyphene-Acetaminophen)     Patient Measurements: Height: 5\' 7"  (170.2 cm) Weight: 182 lb 15.7 oz (83 kg) IBW/kg (Calculated) : 61.6  Heparin Dosing Weight: 79kg  Vital Signs: Temp: 98.4 F (36.9 C) (05/22 0745) Temp src: Oral (05/22 0745) BP: 126/79 mmHg (05/22 0745) Pulse Rate: 74  (05/22 0745)  Labs:  Basename 08/22/11 0540 08/21/11 0507 08/20/11 1210 08/20/11 0530  HGB 11.2* 10.9* -- --  HCT 33.6* 32.6* -- 31.3*  PLT 319 335 -- 292  APTT -- -- -- --  LABPROT 28.2* 24.7* -- 20.4*  INR 2.59* 2.19* -- 1.71*  HEPARINUNFRC 0.38 0.51 0.42 --  CREATININE -- 0.95 -- 0.94  CKTOTAL -- -- -- --  CKMB -- -- -- --  TROPONINI -- -- -- --   Estimated Creatinine Clearance: 85.5 ml/min (by C-G formula based on Cr of 0.95).  Medications:  Infusions:    . heparin 1,650 Units/hr (08/22/11 0325)  . DISCONTD: sodium chloride 1,000 mL (08/21/11 1612)     Quintavious Rinck, Drake Leach 08/22/2011,8:39 AM

## 2011-08-23 ENCOUNTER — Inpatient Hospital Stay (HOSPITAL_COMMUNITY): Payer: Medicaid Other

## 2011-08-23 DIAGNOSIS — R0602 Shortness of breath: Secondary | ICD-10-CM

## 2011-08-23 DIAGNOSIS — D696 Thrombocytopenia, unspecified: Secondary | ICD-10-CM

## 2011-08-23 DIAGNOSIS — Z8679 Personal history of other diseases of the circulatory system: Secondary | ICD-10-CM

## 2011-08-23 DIAGNOSIS — M545 Low back pain: Secondary | ICD-10-CM

## 2011-08-23 LAB — CBC
HCT: 37.4 % (ref 36.0–46.0)
MCHC: 33.4 g/dL (ref 30.0–36.0)
MCV: 92.3 fL (ref 78.0–100.0)
Platelets: 356 10*3/uL (ref 150–400)
RDW: 15.3 % (ref 11.5–15.5)
WBC: 10.2 10*3/uL (ref 4.0–10.5)

## 2011-08-23 LAB — BASIC METABOLIC PANEL
BUN: 9 mg/dL (ref 6–23)
CO2: 28 mEq/L (ref 19–32)
Calcium: 9.2 mg/dL (ref 8.4–10.5)
Chloride: 95 mEq/L — ABNORMAL LOW (ref 96–112)
Creatinine, Ser: 1.11 mg/dL — ABNORMAL HIGH (ref 0.50–1.10)

## 2011-08-23 MED ORDER — NICOTINE 14 MG/24HR TD PT24
14.0000 mg | MEDICATED_PATCH | Freq: Every day | TRANSDERMAL | Status: DC
  Administered 2011-08-23 – 2011-08-24 (×2): 14 mg via TRANSDERMAL
  Filled 2011-08-23 (×2): qty 1

## 2011-08-23 MED ORDER — WARFARIN SODIUM 5 MG PO TABS
5.0000 mg | ORAL_TABLET | Freq: Once | ORAL | Status: AC
  Administered 2011-08-23: 5 mg via ORAL
  Filled 2011-08-23: qty 1

## 2011-08-23 MED ORDER — FUROSEMIDE 10 MG/ML IJ SOLN
20.0000 mg | Freq: Once | INTRAMUSCULAR | Status: DC
  Filled 2011-08-23: qty 2

## 2011-08-23 MED ORDER — FUROSEMIDE 10 MG/ML IJ SOLN
40.0000 mg | Freq: Once | INTRAMUSCULAR | Status: AC
  Administered 2011-08-23: 40 mg via INTRAVENOUS
  Filled 2011-08-23: qty 4

## 2011-08-23 MED ORDER — POTASSIUM CHLORIDE 10 MEQ/100ML IV SOLN
10.0000 meq | INTRAVENOUS | Status: AC
  Administered 2011-08-23 (×4): 10 meq via INTRAVENOUS
  Filled 2011-08-23 (×4): qty 100

## 2011-08-23 MED ORDER — HYDROCODONE-ACETAMINOPHEN 5-325 MG PO TABS
1.0000 | ORAL_TABLET | Freq: Four times a day (QID) | ORAL | Status: DC | PRN
  Administered 2011-08-23 – 2011-08-24 (×4): 1 via ORAL
  Filled 2011-08-23 (×4): qty 1

## 2011-08-23 NOTE — Progress Notes (Signed)
ANTICOAGULATION CONSULT NOTE - Follow Up Consult  Pharmacy Consult for coumadin Indication: hx PE  Assessment: 41 yof anticoagulated on coumadin for history of PE. INR is therapeutic today at 2.6<---2.59. Has received 2 doses of home dose 5 mg daily now and seems steady. CBC is stable. No bleeding noted.   Goal of Therapy:  INR 2-3   Plan:  1. Coumadin 5mg  PO x 1 tonight 2. F/u AM INR  Allergies  Allergen Reactions  . Darvocet (Propoxyphene-Acetaminophen)     Patient Measurements: Height: 5\' 7"  (170.2 cm) Weight: 182 lb 15.7 oz (83 kg) IBW/kg (Calculated) : 61.6  Heparin Dosing Weight: 79kg  Vital Signs: Temp: 98.5 F (36.9 C) (05/23 0900) Temp src: Oral (05/23 0900) BP: 111/78 mmHg (05/23 0900) Pulse Rate: 94  (05/23 0900)  Labs:  Basename 08/23/11 0505 08/22/11 0540 08/21/11 0507 08/20/11 1210  HGB 12.5 11.2* -- --  HCT 37.4 33.6* 32.6* --  PLT 356 319 335 --  APTT -- -- -- --  LABPROT 28.3* 28.2* 24.7* --  INR 2.60* 2.59* 2.19* --  HEPARINUNFRC -- 0.38 0.51 0.42  CREATININE 1.11* -- 0.95 --  CKTOTAL -- -- -- --  CKMB -- -- -- --  TROPONINI -- -- -- --   Estimated Creatinine Clearance: 73.2 ml/min (by C-G formula based on Cr of 1.11).  Medications:  Infusions:     Aniel Hubble, [place] R 08/23/2011,10:12 AM
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
## 2011-08-23 NOTE — Care Management Note (Unsigned)
    Page 1 of 2   08/23/2011     3:26:06 PM   CARE MANAGEMENT NOTE 08/23/2011  Patient:  [facility]   Account Number:  [id number]  Date Initiated:  08/20/2011  Documentation initiated by:  Letha Cape  Subjective/Objective Assessment:   dx cap  admit- lives with spouse.  pta independent.     Action/Plan:   pt eval- no pt needs   Anticipated DC Date:  08/24/2011   Anticipated DC Plan:  HOME W HOME HEALTH SERVICES      DC Planning Services  CM consult      [facility] Choice  HOME HEALTH   Choice offered to / List presented to:  C-1 Patient        HH arranged  HH-1 RN      [facility] agency  [facility]   Status of service:  In process, will continue to follow Medicare Important Message given?   (If response is "NO", the following Medicare IM given date fields will be blank) Date Medicare IM given:   Date Additional Medicare IM given:    Discharge Disposition:    Per UR Regulation:  Reviewed for med. necessity/level of care/duration of stay  If discussed at Long Length of Stay Meetings, dates discussed:    Comments:  08/23/11 15:21 Letha Cape RN, BSN [phone] patient for possible dc tomorrow, patient may need home oxygen when she goes home.  Patient has medication coverage and transportation.   NCM will continue to follow for dc needs.   08/20/11 14:19 Letha Cape RN, BSN [phone] patient lives with spouse, pta independent, patient states her PCP is Cala Bradford at [facility], she has had [facility] before to draw pt/inr for her and sent results to [place].  Patient will need HHRN for  pt/inr draw. Referral made to [facility] for [facility], Marie notifed. Soc will begin 24-48 hrs post discharge.

## 2011-08-23 NOTE — Progress Notes (Signed)
  Echocardiogram 2D Echocardiogram has been performed.  [name] A 08/23/2011, 12:26 PM

## 2011-08-23 NOTE — Progress Notes (Signed)
Pt sats at 94% on room air at rest. Pt sats at 88% on room air ambulating.

## 2011-08-24 ENCOUNTER — Inpatient Hospital Stay (HOSPITAL_COMMUNITY): Payer: Medicaid Other

## 2011-08-24 DIAGNOSIS — D696 Thrombocytopenia, unspecified: Secondary | ICD-10-CM

## 2011-08-24 DIAGNOSIS — R0602 Shortness of breath: Secondary | ICD-10-CM

## 2011-08-24 DIAGNOSIS — M545 Low back pain: Secondary | ICD-10-CM

## 2011-08-24 DIAGNOSIS — Z8679 Personal history of other diseases of the circulatory system: Secondary | ICD-10-CM

## 2011-08-24 LAB — CBC
HCT: 39.5 % (ref 36.0–46.0)
MCH: 31.3 pg (ref 26.0–34.0)
MCHC: 34.2 g/dL (ref 30.0–36.0)
MCV: 91.4 fL (ref 78.0–100.0)
RDW: 15.3 % (ref 11.5–15.5)

## 2011-08-24 LAB — BASIC METABOLIC PANEL
BUN: 11 mg/dL (ref 6–23)
CO2: 29 mEq/L (ref 19–32)
Calcium: 9.5 mg/dL (ref 8.4–10.5)
Chloride: 92 mEq/L — ABNORMAL LOW (ref 96–112)
Creatinine, Ser: 1.1 mg/dL (ref 0.50–1.10)
Glucose, Bld: 105 mg/dL — ABNORMAL HIGH (ref 70–99)

## 2011-08-24 LAB — PROTIME-INR: INR: 2.88 — ABNORMAL HIGH (ref 0.00–1.49)

## 2011-08-24 MED ORDER — RISPERIDONE 2 MG PO TABS: 2.0000 mg | ORAL_TABLET | Freq: Every day | ORAL | Status: DC

## 2011-08-24 MED ORDER — WARFARIN SODIUM 5 MG PO TABS: 5.0000 mg | ORAL_TABLET | Freq: Every day | ORAL | Status: DC

## 2011-08-24 MED ORDER — HYDROCODONE-ACETAMINOPHEN 5-325 MG PO TABS: 1.0000 | ORAL_TABLET | Freq: Three times a day (TID) | ORAL | Status: DC | PRN

## 2011-08-24 MED ORDER — ACETAMINOPHEN 325 MG PO TABS: 650.0000 mg | ORAL_TABLET | Freq: Four times a day (QID) | ORAL | Status: DC | PRN

## 2011-08-24 MED ORDER — POTASSIUM CHLORIDE 10 MEQ/100ML IV SOLN
10.0000 meq | INTRAVENOUS | Status: AC
  Administered 2011-08-24 (×3): 10 meq via INTRAVENOUS
  Filled 2011-08-24 (×4): qty 100

## 2011-08-24 MED ORDER — POTASSIUM CHLORIDE CRYS ER 20 MEQ PO TBCR
60.0000 meq | EXTENDED_RELEASE_TABLET | Freq: Once | ORAL | Status: AC
  Administered 2011-08-24: 60 meq via ORAL

## 2011-08-24 MED ORDER — WARFARIN SODIUM 5 MG PO TABS
5.0000 mg | ORAL_TABLET | Freq: Every day | ORAL | Status: AC
  Administered 2011-08-24: 5 mg via ORAL
  Filled 2011-08-24: qty 1

## 2011-08-24 MED ORDER — POTASSIUM CHLORIDE CRYS ER 20 MEQ PO TBCR
EXTENDED_RELEASE_TABLET | ORAL | Status: AC
  Filled 2011-08-24: qty 3

## 2011-08-24 MED ORDER — POLYETHYLENE GLYCOL 3350 17 G PO PACK: 17.0000 g | PACK | Freq: Every day | ORAL | Status: AC

## 2011-08-24 MED ORDER — METOPROLOL TARTRATE 12.5 MG HALF TABLET: 12.5000 mg | ORAL_TABLET | Freq: Two times a day (BID) | ORAL | Status: DC

## 2011-08-24 MED ORDER — PANTOPRAZOLE SODIUM 40 MG PO TBEC: 40.0000 mg | DELAYED_RELEASE_TABLET | Freq: Two times a day (BID) | ORAL | Status: DC
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
## 2011-08-24 NOTE — Progress Notes (Signed)
[name] to be D/C'd Home per MD order.  Discussed with the patient and all questions fully answered.   [name], Flis  Home Medication Instructions JYN:[id number]   Printed on:08/24/11 1610  Medication Information                    aspirin 325 MG tablet Take 325 mg by mouth daily.             benztropine (COGENTIN) 1 MG tablet Take 1 mg by mouth.             gabapentin (NEURONTIN) 400 MG capsule Take 400 mg by mouth 3 (three) times daily.             diazepam (VALIUM) 5 MG tablet Take 5 mg by mouth every 8 (eight) hours as needed.             acetaminophen (TYLENOL) 325 MG tablet Take 2 tablets (650 mg total) by mouth every 6 (six) hours as needed (or Fever >/= 101).           pantoprazole (PROTONIX) 40 MG tablet Take 1 tablet (40 mg total) by mouth every 12 (twelve) hours.           metoprolol tartrate (LOPRESSOR) 12.5 mg TABS Take 0.5 tablets (12.5 mg total) by mouth 2 (two) times daily.           HYDROcodone-acetaminophen (NORCO) 5-325 MG per tablet Take 1 tablet by mouth every 8 (eight) hours as needed for pain.           polyethylene glycol (MIRALAX / GLYCOLAX) packet Take 17 g by mouth daily.           risperiDONE (RISPERDAL) 2 MG tablet Take 1 tablet (2 mg total) by mouth at bedtime.           warfarin (COUMADIN) 5 MG tablet Take 1 tablet (5 mg total) by mouth daily.             VVS, Skin clean, dry and intact without evidence of skin break down, no evidence of skin tears noted. IV catheter discontinued intact. Site without signs and symptoms of complications. Dressing and pressure applied.  An After Visit Summary was printed and given to the patient. Patient escorted via WC, and D/C home via private auto.  Kennyth Arnold D 08/24/2011 4:10 PM

## 2011-08-24 NOTE — Progress Notes (Signed)
O2 sats on room air at rest were 94%. O2 sats after ambulation are 92% on room air.

## 2011-08-24 NOTE — Progress Notes (Signed)
Ambulated pt on room air and her O2 sat was 90%. 93% at rest.

## 2011-08-24 NOTE — Consult Note (Signed)
ANTICOAGULATION CONSULT NOTE - Follow Up Consult  Pharmacy Consult for Coumadin Indication: pulmonary embolus  Allergies  Allergen Reactions  . Darvocet (Propoxyphene-Acetaminophen)     Patient Measurements: Height: 5\' 7"  (170.2 cm) Weight: 188 lb 14.4 oz (85.684 kg) IBW/kg (Calculated) : 61.6    Vital Signs: Temp: 97.6 F (36.4 C) (05/24 0453) Temp src: Oral (05/24 0453) BP: 111/81 mmHg (05/24 1007) Pulse Rate: 110  (05/24 1007)  Labs:  Basename 08/24/11 0535 08/23/11 0505 08/22/11 0540  HGB 13.5 12.5 --  HCT 39.5 37.4 33.6*  PLT 390 356 319  APTT -- -- --  LABPROT 30.6* 28.3* 28.2*  INR 2.88* 2.60* 2.59*  HEPARINUNFRC -- -- 0.38  CREATININE 1.10 1.11* --  CKTOTAL -- -- --  CKMB -- -- --  TROPONINI -- -- --   Estimated Creatinine Clearance: 74.9 ml/min (by C-G formula based on Cr of 1.1).   Medications:  Prescriptions prior to admission  Medication Sig Dispense Refill  . aspirin 325 MG tablet Take 325 mg by mouth daily.        . benztropine (COGENTIN) 1 MG tablet Take 1 mg by mouth.        . diazepam (VALIUM) 5 MG tablet Take 5 mg by mouth every 8 (eight) hours as needed.        . gabapentin (NEURONTIN) 400 MG capsule Take 400 mg by mouth 3 (three) times daily.        Marland Kitchen HYDROcodone-acetaminophen (NORCO) 5-325 MG per tablet Take 1 tablet by mouth every 6 (six) hours as needed.        . metoprolol tartrate (LOPRESSOR) 12.5 mg TABS Take 12.5 mg by mouth 2 (two) times daily.        . pantoprazole (PROTONIX) 40 MG tablet Take 40 mg by mouth every 12 (twelve) hours.        Marland Kitchen warfarin (COUMADIN) 5 MG tablet Take 5 mg by mouth daily.        Scheduled:    . albuterol  5 mg Nebulization Once  . antiseptic oral rinse  15 mL Mouth Rinse BID  . azithromycin  500 mg Intravenous Q24H  . cefTRIAXone (ROCEPHIN)  IV  1 g Intravenous Q24H  . furosemide  40 mg Intravenous Once  . gabapentin  400 mg Oral TID  . metoprolol tartrate  12.5 mg Oral BID  . nicotine  14 mg  Transdermal Daily  . pantoprazole  40 mg Oral Q12H  . potassium chloride  10 mEq Intravenous Q1 Hr x 4  . potassium chloride  10 mEq Intravenous Q1 Hr x 4  . potassium chloride SA      . potassium chloride  60 mEq Oral Once  . risperiDONE  2 mg Oral QHS  . sodium chloride  3 mL Intravenous Q12H  . warfarin  5 mg Oral ONCE-1800  . Warfarin - Pharmacist Dosing Inpatient   Does not apply q1800   Anti-infectives     Start     Dose/Rate Route Frequency Ordered Stop   08/18/11 2200   azithromycin (ZITHROMAX) 500 mg in dextrose 5 % 250 mL IVPB        500 mg 250 mL/hr over 60 Minutes Intravenous Every 24 hours 08/18/11 0129     08/18/11 2100   cefTRIAXone (ROCEPHIN) 1 g in dextrose 5 % 50 mL IVPB        1 g 100 mL/hr over 30 Minutes Intravenous Every 24 hours 08/18/11 0129     08/18/11  0030   cefTRIAXone (ROCEPHIN) 1 g in dextrose 5 % 50 mL IVPB  Status:  Discontinued        1 g 100 mL/hr over 30 Minutes Intravenous Every 24 hours 08/18/11 0029 08/18/11 0129   08/18/11 0030   azithromycin (ZITHROMAX) 500 mg in dextrose 5 % 250 mL IVPB  Status:  Discontinued        500 mg 250 mL/hr over 60 Minutes Intravenous Every 24 hours 08/18/11 0029 08/18/11 0129   08/18/11 0000   cefTRIAXone (ROCEPHIN) 1 g in dextrose 5 % 50 mL IVPB        1 g 100 mL/hr over 30 Minutes Intravenous  Once 08/17/11 2345 08/18/11 0039   08/18/11 0000   azithromycin (ZITHROMAX) 500 mg in dextrose 5 % 250 mL IVPB        500 mg 250 mL/hr over 60 Minutes Intravenous  Once 08/17/11 2345 08/18/11 0130          Assessment:  41 y/o female on life long Coumadin due to history of hypercoagulable state.   She has a history of PE.  INR therapeutic.   WBC/Hgb/Hct/Plts:  12.6/13.5/39.5/390 (05/24 0535)   No complications noted.  Goal of Therapy:   INR 2-3   Plan:   Coumadin 5 mg daily. Continue daily INR's, CBC.  Yerania Chamorro, Elisha Headland, Pharm.D. 08/24/2011 2:44 PM
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
## 2011-08-26 ENCOUNTER — Encounter (HOSPITAL_COMMUNITY): Payer: Self-pay | Admitting: *Deleted

## 2011-08-26 ENCOUNTER — Emergency Department (HOSPITAL_COMMUNITY): Payer: Medicaid Other

## 2011-08-26 ENCOUNTER — Emergency Department (HOSPITAL_COMMUNITY)
Admission: EM | Admit: 2011-08-26 | Discharge: 2011-08-28 | Disposition: A | Discharge status: Other Acute Inpatient Hospital | Payer: Medicaid Other | Attending: Emergency Medicine | Admitting: Emergency Medicine

## 2011-08-26 DIAGNOSIS — D72829 Elevated white blood cell count, unspecified: Secondary | ICD-10-CM | POA: Insufficient documentation

## 2011-08-26 DIAGNOSIS — Z79899 Other long term (current) drug therapy: Secondary | ICD-10-CM | POA: Insufficient documentation

## 2011-08-26 DIAGNOSIS — R000 Tachycardia, unspecified: Secondary | ICD-10-CM | POA: Insufficient documentation

## 2011-08-26 DIAGNOSIS — F209 Schizophrenia, unspecified: Secondary | ICD-10-CM | POA: Insufficient documentation

## 2011-08-26 DIAGNOSIS — Z86718 Personal history of other venous thrombosis and embolism: Secondary | ICD-10-CM | POA: Insufficient documentation

## 2011-08-26 DIAGNOSIS — Z7901 Long term (current) use of anticoagulants: Secondary | ICD-10-CM | POA: Insufficient documentation

## 2011-08-26 DIAGNOSIS — R4182 Altered mental status, unspecified: Secondary | ICD-10-CM

## 2011-08-26 DIAGNOSIS — Z7982 Long term (current) use of aspirin: Secondary | ICD-10-CM | POA: Insufficient documentation

## 2011-08-26 LAB — URINALYSIS, ROUTINE W REFLEX MICROSCOPIC
Bilirubin Urine: NEGATIVE
Ketones, ur: NEGATIVE mg/dL
Leukocytes, UA: NEGATIVE
Nitrite: NEGATIVE
Urobilinogen, UA: 0.2 mg/dL (ref 0.0–1.0)
pH: 5.5 (ref 5.0–8.0)

## 2011-08-26 LAB — COMPREHENSIVE METABOLIC PANEL
AST: 22 U/L (ref 0–37)
Albumin: 4 g/dL (ref 3.5–5.2)
BUN: 8 mg/dL (ref 6–23)
CO2: 21 mEq/L (ref 19–32)
Calcium: 9.9 mg/dL (ref 8.4–10.5)
Chloride: 95 mEq/L — ABNORMAL LOW (ref 96–112)
Creatinine, Ser: 1.15 mg/dL — ABNORMAL HIGH (ref 0.50–1.10)
GFR calc non Af Amer: 58 mL/min — ABNORMAL LOW (ref >90)
Total Bilirubin: 0.2 mg/dL — ABNORMAL LOW (ref 0.3–1.2)

## 2011-08-26 LAB — CBC
HCT: 42.3 % (ref 36.0–46.0)
Hemoglobin: 14.3 g/dL (ref 12.0–15.0)
MCHC: 33.8 g/dL (ref 30.0–36.0)
MCV: 91.8 fL (ref 78.0–100.0)
RDW: 15.3 % (ref 11.5–15.5)

## 2011-08-26 LAB — DIFFERENTIAL
Basophils Absolute: 0.1 10*3/uL (ref 0.0–0.1)
Basophils Relative: 0 % (ref 0–1)
Eosinophils Relative: 1 % (ref 0–5)
Monocytes Absolute: 2.2 10*3/uL — ABNORMAL HIGH (ref 0.1–1.0)
Monocytes Relative: 12 % (ref 3–12)

## 2011-08-26 LAB — ETHANOL: Alcohol, Ethyl (B): <11 mg/dL (ref 0–11)

## 2011-08-26 LAB — RAPID URINE DRUG SCREEN, HOSP PERFORMED
Amphetamines: NOT DETECTED
Opiates: POSITIVE — AB
Tetrahydrocannabinol: POSITIVE — AB

## 2011-08-26 LAB — URINE MICROSCOPIC-ADD ON

## 2011-08-26 LAB — PROTIME-INR
INR: 2.88 — ABNORMAL HIGH (ref 0.00–1.49)
Prothrombin Time: 30.6 seconds — ABNORMAL HIGH (ref 11.6–15.2)

## 2011-08-26 MED ORDER — LORAZEPAM 2 MG/ML IJ SOLN
2.0000 mg | Freq: Once | INTRAMUSCULAR | Status: AC
  Administered 2011-08-26: 2 mg via INTRAMUSCULAR
  Filled 2011-08-26: qty 1

## 2011-08-26 MED ORDER — ZIPRASIDONE MESYLATE 20 MG IM SOLR
20.0000 mg | Freq: Once | INTRAMUSCULAR | Status: AC
  Administered 2011-08-26: 20 mg via INTRAMUSCULAR
  Filled 2011-08-26: qty 20

## 2011-08-26 MED ORDER — IOHEXOL 300 MG/ML  SOLN
100.0000 mL | Freq: Once | INTRAMUSCULAR | Status: AC | PRN
  Administered 2011-08-26: 100 mL via INTRAVENOUS
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
## 2011-08-26 NOTE — ED Notes (Signed)
Patient found outside ED at what she stated was her car. Stated she got a message from [facility] that she could leave and she is all better. After multiple attempts and with assistance from security and [employer] . Patient was brought back to room . Patient is currently pending IVC.

## 2011-08-26 NOTE — ED Notes (Signed)
Pt anxious, sitting with clenched fists; Pt stated, "I just want to be somewhere where people will let me pray".

## 2011-08-26 NOTE — ED Notes (Addendum)
Pt was transferred to [facility] A bed to be monitored by sitter. VS taken and stable. Pt is alert and oriented x 3. She stated that her name is [name] and that she does not go by the last name listed above (Estep). However she stated that she is here because "they wont allow her to pray out loud". She is slightly aware of her need to take Coumadin and the need to have [facility]( [facility] home care) to come out to here house on Tues.

## 2011-08-26 NOTE — ED Notes (Signed)
Patient transfer to [facility] - awaiting report to [facility]

## 2011-08-26 NOTE — ED Notes (Signed)
KGM:WN02<VO> Expected date:08/26/11<BR> Expected time:<BR> Means of arrival:<BR> Comments:<BR> EMS 51 GC - psych eval

## 2011-08-26 NOTE — ED Notes (Signed)
Bed:WHALA<BR> Expected date:08/26/11<BR> Expected time: 8:55 PM<BR> Means of arrival:<BR> Comments:<BR> Hold for rm 2

## 2011-08-26 NOTE — ED Provider Notes (Addendum)
History     CSN: [id number]  Arrival date & time 08/26/11  1759   First MD Initiated Contact with Patient 08/26/11 1812      Chief Complaint  Patient presents with  . Psychiatric Evaluation    (Consider location/radiation/quality/duration/timing/severity/associated sxs/prior treatment) The history is provided by the patient and the EMS personnel.   patient was brought in by EMS for altered mental status. She just wants to be somewhere will people will let her pray. She has a history of schizophrenia and will not answer questions. She only will state that she is praying. She was recently admitted for pneumonia.   Past Medical History  Diagnosis Date  . Pulmonary emboli   . Schizophrenia     Past Surgical History  Procedure Date  . No past surgeries     No family history on file.  History  Substance Use Topics  . Smoking status: Current Everyday Smoker -- 3.0 packs/day for 25 years    Types: Cigarettes  . Smokeless tobacco: Never Used  . Alcohol Use: No    OB History    Grav Para Term Preterm Abortions TAB SAB Ect Mult Living                  Review of Systems  Unable to perform ROS   Allergies  Darvocet  Home Medications   Current Outpatient Rx  Name Route Sig Dispense Refill  . ACETAMINOPHEN 325 MG PO TABS Oral Take 2 tablets (650 mg total) by mouth every 6 (six) hours as needed (or Fever >/= 101).    . ASPIRIN 325 MG PO TABS Oral Take 325 mg by mouth daily.      Marland Kitchen BENZTROPINE MESYLATE 1 MG PO TABS Oral Take 1 mg by mouth daily.     Marland Kitchen DIAZEPAM 5 MG PO TABS Oral Take 5 mg by mouth every 8 (eight) hours as needed.      Marland Kitchen GABAPENTIN 400 MG PO CAPS Oral Take 400 mg by mouth 3 (three) times daily.      Marland Kitchen HYDROCODONE-ACETAMINOPHEN 5-325 MG PO TABS Oral Take 1 tablet by mouth every 8 (eight) hours as needed for pain. 15 tablet 0  . METOPROLOL TARTRATE 12.5 MG HALF TABLET Oral Take 0.5 tablets (12.5 mg total) by mouth 2 (two) times daily. 60 tablet 0  .  PANTOPRAZOLE SODIUM 40 MG PO TBEC Oral Take 1 tablet (40 mg total) by mouth every 12 (twelve) hours. 30 tablet 0  . POLYETHYLENE GLYCOL 3350 PO PACK Oral Take 17 g by mouth daily. 14 each   . RISPERIDONE 2 MG PO TABS Oral Take 1 tablet (2 mg total) by mouth at bedtime. 30 tablet 0  . WARFARIN SODIUM 5 MG PO TABS Oral Take 1 tablet (5 mg total) by mouth daily. 10 tablet 0    BP 123/73  Pulse 112  Temp(Src) 98.7 F (37.1 C) (Oral)  Resp 22  Ht 5\' 6"  (1.676 m)  SpO2 94%  LMP 08/17/2011  Physical Exam  Constitutional: She appears well-developed.  Cardiovascular:       tachycardial  Pulmonary/Chest: Effort normal.  Abdominal: Soft. There is no tenderness.  Neurological: She is alert.       Patient is uncooperative and will only state that she is praying  Psychiatric:       Abnormal thought content    ED Course  Procedures (including critical care time)  Labs Reviewed  CBC - Abnormal; Notable for the following:  WBC 18.9 (*)    Platelets 533 (*)    All other components within normal limits  DIFFERENTIAL - Abnormal; Notable for the following:    Neutro Abs 13.4 (*)    Monocytes Absolute 2.2 (*)    All other components within normal limits  COMPREHENSIVE METABOLIC PANEL - Abnormal; Notable for the following:    Sodium 134 (*)    Chloride 95 (*)    Glucose, Bld 124 (*)    Creatinine, Ser 1.15 (*)    Total Protein 8.5 (*)    Total Bilirubin 0.2 (*)    GFR calc non Af Amer 58 (*)    GFR calc Af Amer 67 (*)    All other components within normal limits  PROTIME-INR - Abnormal; Notable for the following:    Prothrombin Time 30.6 (*)    INR 2.88 (*)    All other components within normal limits  URINALYSIS, ROUTINE W REFLEX MICROSCOPIC - Abnormal; Notable for the following:    APPearance CLOUDY (*)    Hgb urine dipstick MODERATE (*)    All other components within normal limits  URINE RAPID DRUG SCREEN (HOSP PERFORMED) - Abnormal; Notable for the following:    Opiates  POSITIVE (*)    Benzodiazepines POSITIVE (*)    Tetrahydrocannabinol POSITIVE (*)    All other components within normal limits  URINE MICROSCOPIC-ADD ON - Abnormal; Notable for the following:    Bacteria, UA MANY (*)    All other components within normal limits  PREGNANCY, URINE  ETHANOL   Ct Head W Wo Contrast  08/26/2011  *RADIOLOGY REPORT*  Clinical Data:  Altered mental status.  Schizophrenia.  Possible infection.  CT HEAD WITHOUT AND WITH CONTRAST  Technique:  Contiguous axial images were obtained from the base of the skull through the vertex without and with intravenous contrast  Contrast: OMNIPAQUE IOHEXOL 300 MG/ML  SOLN  Comparison:   None.  Findings:  The brain has a normal appearance without evidence for hemorrhage, acute infarction, hydrocephalus or mass lesion.  There is no extra axial fluid collection.  The skull and paranasal sinuses are normal.  Following contrast infusion there is a normal enhancement pattern and no mass lesion is identified.  IMPRESSION: Normal CT of the head without and with contrast.  Original Report Authenticated By: Elsie Stain, M.D.   Dg Chest Port 1 View  08/26/2011  *RADIOLOGY REPORT*  Clinical Data: Altered mental status  PORTABLE CHEST - 1 VIEW  Comparison: 08/24/2011  Findings: Moderate cardiomegaly.  Low volumes. Bibasilar atelectasis. Normal vascularity.  IMPRESSION: Cardiomegaly and bibasilar atelectasis. No sign of pulmonary edema.  Original Report Authenticated By: Donavan Burnet, M.D.     1. Altered mental status   2. Schizophrenia   3. Leukocytosis     Date: 08/26/2011  Rate: 127  Rhythm: sinus tachycardia  QRS Axis: normal  Intervals: normal  ST/T Wave abnormalities: normal  Conduction Disutrbances:none  Narrative Interpretation: rate has increased   Old EKG Reviewed: changes noted     MDM  Patient with altered mental status. Uncooperative evaluation. History schizophrenia. She is tachycardic. She has a leukocytosis  without fever. Laboratory was awakened at 18,000. She has a previous history of an aortic thrombus. There was questionable infection of that. Head CT with and without contrast is negative. Patient be seen by medicine and if cleared by them the ECT evaluation. If patient develops fevers she may need blood cultures but at this time they do not believe  there is an infectious cause for leukocytosis.        Juliet Rude. Rubin Payor, MD 08/26/11 1610  Juliet Rude. Rubin Payor, MD 08/26/11 2355  Juliet Rude. Rubin Payor, MD 08/27/11 0020

## 2011-08-26 NOTE — ED Notes (Signed)
Report given to receiving nurse Lorne Skeens, RN

## 2011-08-26 NOTE — ED Notes (Signed)
Bed:WA02<BR> Expected date:<BR> Expected time:<BR> Means of arrival:<BR> Comments:<BR> Closed

## 2011-08-26 NOTE — ED Notes (Signed)
Patient transported to CT 

## 2011-08-27 DIAGNOSIS — F209 Schizophrenia, unspecified: Secondary | ICD-10-CM

## 2011-08-27 DIAGNOSIS — D7289 Other specified disorders of white blood cells: Secondary | ICD-10-CM

## 2011-08-27 DIAGNOSIS — R4182 Altered mental status, unspecified: Secondary | ICD-10-CM

## 2011-08-27 LAB — CBC
HCT: 41.1 % (ref 36.0–46.0)
Hemoglobin: 14.1 g/dL (ref 12.0–15.0)
MCH: 31.3 pg (ref 26.0–34.0)
MCHC: 34.3 g/dL (ref 30.0–36.0)
RDW: 15.2 % (ref 11.5–15.5)

## 2011-08-27 LAB — DIFFERENTIAL
Basophils Absolute: 0.1 10*3/uL (ref 0.0–0.1)
Basophils Relative: 1 % (ref 0–1)
Eosinophils Absolute: 0.2 10*3/uL (ref 0.0–0.7)
Eosinophils Relative: 1 % (ref 0–5)
Monocytes Absolute: 1.7 10*3/uL — ABNORMAL HIGH (ref 0.1–1.0)
Monocytes Relative: 13 % — ABNORMAL HIGH (ref 3–12)

## 2011-08-27 MED ORDER — RISPERIDONE 2 MG PO TABS: 2.0000 mg | ORAL_TABLET | Freq: Every day | ORAL | Status: DC

## 2011-08-27 MED ORDER — IBUPROFEN 600 MG PO TABS: 600.0000 mg | ORAL_TABLET | Freq: Three times a day (TID) | ORAL | Status: DC | PRN

## 2011-08-27 MED ORDER — BENZTROPINE MESYLATE 1 MG PO TABS
1.0000 mg | ORAL_TABLET | Freq: Every day | ORAL | Status: DC
  Administered 2011-08-27: 1 mg via ORAL
  Filled 2011-08-27 (×2): qty 1

## 2011-08-27 MED ORDER — LORAZEPAM 2 MG/ML IJ SOLN
2.0000 mg | Freq: Three times a day (TID) | INTRAMUSCULAR | Status: DC | PRN
  Administered 2011-08-27 – 2011-08-28 (×2): 2 mg via INTRAMUSCULAR
  Filled 2011-08-27 (×2): qty 1

## 2011-08-27 MED ORDER — WARFARIN SODIUM 5 MG PO TABS: 5.0000 mg | ORAL_TABLET | Freq: Every day | ORAL | Status: DC

## 2011-08-27 MED ORDER — ASPIRIN 325 MG PO TABS
325.0000 mg | ORAL_TABLET | Freq: Every day | ORAL | Status: DC
  Administered 2011-08-27: 325 mg via ORAL
  Filled 2011-08-27 (×2): qty 1

## 2011-08-27 MED ORDER — GABAPENTIN 400 MG PO CAPS
400.0000 mg | ORAL_CAPSULE | Freq: Three times a day (TID) | ORAL | Status: DC
  Administered 2011-08-27: 400 mg via ORAL
  Filled 2011-08-27 (×6): qty 1

## 2011-08-27 MED ORDER — ACETAMINOPHEN 325 MG PO TABS: 650.0000 mg | ORAL_TABLET | ORAL | Status: DC | PRN

## 2011-08-27 MED ORDER — ZIPRASIDONE MESYLATE 20 MG IM SOLR
20.0000 mg | Freq: Two times a day (BID) | INTRAMUSCULAR | Status: DC | PRN
  Filled 2011-08-27: qty 20

## 2011-08-27 MED ORDER — LORAZEPAM 2 MG/ML IJ SOLN
2.0000 mg | Freq: Once | INTRAMUSCULAR | Status: AC
  Administered 2011-08-27: 2 mg via INTRAVENOUS
  Filled 2011-08-27: qty 1

## 2011-08-27 MED ORDER — ONDANSETRON HCL 4 MG PO TABS: 4.0000 mg | ORAL_TABLET | Freq: Three times a day (TID) | ORAL | Status: DC | PRN

## 2011-08-27 MED ORDER — PANTOPRAZOLE SODIUM 40 MG PO TBEC
40.0000 mg | DELAYED_RELEASE_TABLET | Freq: Two times a day (BID) | ORAL | Status: DC
  Administered 2011-08-27: 40 mg via ORAL
  Filled 2011-08-27 (×2): qty 1

## 2011-08-27 MED ORDER — WARFARIN - PHARMACIST DOSING INPATIENT: Freq: Every day | Status: DC

## 2011-08-27 MED ORDER — METOPROLOL TARTRATE 25 MG PO TABS
12.5000 mg | ORAL_TABLET | Freq: Two times a day (BID) | ORAL | Status: DC
  Administered 2011-08-27: 12.5 mg via ORAL
  Filled 2011-08-27: qty 2
  Filled 2011-08-27: qty 1

## 2011-08-27 MED ORDER — NICOTINE 21 MG/24HR TD PT24
21.0000 mg | MEDICATED_PATCH | Freq: Every day | TRANSDERMAL | Status: DC | PRN
  Filled 2011-08-27: qty 1

## 2011-08-27 MED ORDER — ZOLPIDEM TARTRATE 5 MG PO TABS: 5.0000 mg | ORAL_TABLET | Freq: Every evening | ORAL | Status: DC | PRN

## 2011-08-27 NOTE — Consult Note (Signed)
PCP:   No primary provider on file.   Physician requesting consultation: Dr. Benjiman Brooks.  Reason for consultation: Evaluation and management of leukocytosis and altered mental status.  Chief Complaint:  Altered mental status.  HPI: [name] patient with history of recent hospitalization between 08/18/2011 to 08/24/2011 at [facility] for community acquired pneumonia. She also has past history of pulmonary embolism on anticoagulation, hypercoagulable state, hypertension, diastolic dysfunction and chronic pain. Patient was now sent to the emergency department with altered mental status. Patient denies any physical complaints. She indicates that she has been exercising her neck regularly for her neck pain. She says that she was praying at home and her boyfriend and 63 year old son indicated that she was behaving "crazy" and sent her to the emergency department. She says that she has been talking to God who has been telling her that she's been doing well. She says she also asks got to protect her from cancer before smoking cigarettes. She denies suicidal or homicidal ideations. During the interview she seemed to grab something out of air and when asked says that she thought there was a net (there was none).   She denies headache, earache or sore throat. No fever or chills. No cough, chest pain or dyspnea or palpitations. No nausea or vomiting. Note toothache. No abdominal pain or diarrhea. She is chronically constipated but is passing flatus. No urinary frequency or dysuria. Denies open wounds. No joint pains or swellings.  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 In the past she has been evaluated for leukocytosis including oncology and infectious disease consultation. 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 Septic workup was negative. She was taken off antibiotics. The cause of the leukocytosis was not clearly defined.    Past Medical History: Past Medical History  Diagnosis Date  . Pulmonary emboli   . Schizophrenia     Past Surgical History: Past Surgical History  Procedure Date  . No past surgeries     Allergies:   Allergies  Allergen Reactions  . Darvocet (Propoxyphene-Acetaminophen)     Medications: Prior to Admission medications   Medication Sig Start Date End Date Taking? Authorizing Provider  acetaminophen (TYLENOL) 325 MG tablet Take 2 tablets (650 mg total) by mouth every 6 (six) hours as needed (or Fever >/= 101). 08/24/11 08/23/12 Yes Shanker Levora Dredge, MD  aspirin 325 MG tablet Take 325 mg by mouth daily.     Yes Historical Provider, MD  benztropine (COGENTIN) 1 MG tablet Take 1 mg by mouth daily.    Yes Historical Provider, MD  diazepam (VALIUM) 5 MG tablet Take 5 mg by mouth every 8 (eight) hours as needed.     Yes Historical Provider, MD  gabapentin (NEURONTIN) 400 MG capsule Take 400 mg by mouth 3 (three) times daily.     Yes Historical Provider, MD  HYDROcodone-acetaminophen (NORCO) 5-325 MG per tablet Take 1 tablet by mouth every 8 (eight) hours as needed for pain. 08/24/11  Yes Shanker Levora Dredge, MD  metoprolol tartrate (LOPRESSOR) 12.5 mg TABS Take 0.5 tablets (12.5 mg total) by mouth 2 (two) times daily. 08/24/11  Yes Shanker Levora Dredge, MD  pantoprazole (PROTONIX) 40 MG tablet Take 1 tablet (40 mg total) by mouth every 12 (twelve) hours. 08/24/11  Yes Shanker Levora Dredge, MD  polyethylene  glycol (MIRALAX / GLYCOLAX) packet Take 17 g by mouth daily. 08/24/11 08/27/11 Yes Shanker Levora Dredge, MD  risperiDONE (RISPERDAL) 2 MG tablet Take 1 tablet (2 mg total) by mouth at bedtime. 08/24/11 09/23/11 Yes Shanker Levora Dredge, MD  warfarin (COUMADIN) 5 MG tablet Take 1 tablet (5 mg total) by mouth daily. 08/24/11  Yes Shanker Levora Dredge, MD     Family History: No family history on file.   Social History:  reports that she has been smoking Cigarettes.  She has a 75 pack-year smoking history. She has never used smokeless tobacco. She reports that she does not drink alcohol or use illicit drugs.  Patient indicates that she lives with her boyfriend and 47 year old son. She is independent of activities of daily living. She indicates that she smoked marijuana a few days ago.  Review of Systems:  All systems reviewed and apart from history of presenting illness, are negative  Physical Exam: Filed Vitals:   08/26/11 2230 08/26/11 2245 08/26/11 2300 08/26/11 2330  BP:  118/104 115/74 123/73  Pulse: 114 116 117 112  Temp:    98.7 F (37.1 C)  TempSrc:    Oral  Resp: 25 18 28 22   Height:      SpO2: 94% 96% 95% 94%   General appearance: Moderately built and nourished female patient who is lying comfortably in the gurney and is in no obvious distress.  Head: Nontraumatic and normocephalic.  Eyes: Pupils equally reacting to light and accommodation.  Ears: Normal  Nose: No acute findings. No sinus tenderness.  Throat: Mucosa is moist. No oral thrush or other acute findings.  Neck: Supple. No JVD or carotid bruit. Lymph nodes: No lymphadenopathy.  Resp: Clear to auscultation. No increased work of breathing.  Cardio: First and second heart sounds heard, regular and mildly tachycardic. No murmurs or JVD or gallop or pedal edema.   GI: Non distended. Soft and nontender. No organomegaly or masses appreciated. Normal bowel sounds heard.  Extremities: symmetric 5/5 power. Patient has a tattoo around the lower left ankle. Skin: No other acute findings. Patient has a tattoo on the right upper back. Neurologic: Alert and oriented to person only. No focal neurological deficits. Psychiatric: Flat affect. Possible visual hallucinations and auditory hallucinations. She seems to think that she is in a different hospital and not [facility].   Labs on Admission:   [facility] 08/26/11 1950 08/24/11 1505 08/24/11 0535  NA 134* -- 135  K 3.7 4.4 --  CL 95* -- 92*  CO2 21 -- 29  GLUCOSE 124* -- 105*  BUN 8 -- 11  CREATININE 1.15* -- 1.10  CALCIUM 9.9 -- 9.5  MG -- -- --  PHOS -- -- --    Basename 08/26/11 1950  AST 22  ALT 15  ALKPHOS 101  BILITOT 0.2*  PROT 8.5*  ALBUMIN 4.0   No results found for this basename: LIPASE:2,AMYLASE:2 in the last 72 hours  Basename 08/26/11 1950 08/24/11 0535  WBC 18.9* 12.6*  NEUTROABS 13.4* --  HGB 14.3 13.5  HCT 42.3 39.5  MCV 91.8 91.4  PLT 533* 390   No results found for this basename: CKTOTAL:3,CKMB:3,CKMBINDEX:3,TROPONINI:3 in the last 72 hours No results found for this basename: TSH,T4TOTAL,FREET3,T3FREE,THYROIDAB in the last 72 hours No results found for this basename: VITAMINB12:2,FOLATE:2,FERRITIN:2,TIBC:2,IRON:2,RETICCTPCT:2 in the last 72 hours  Radiological Exams on Admission:  Ct Head W Wo Contrast  08/26/2011  *RADIOLOGY REPORT*  Clinical Data:  Altered mental status.  Schizophrenia.  Possible infection.  CT HEAD WITHOUT AND WITH CONTRAST  Technique:  Contiguous axial images were obtained from the base of the skull through the vertex without and with intravenous contrast  Contrast: OMNIPAQUE IOHEXOL 300 MG/ML  SOLN  Comparison:   None.  Findings:  The brain has a normal appearance without evidence for hemorrhage, acute infarction, hydrocephalus or mass lesion.  There is no extra axial fluid collection.  The skull and paranasal sinuses are normal.  Following contrast infusion there is a normal enhancement pattern and no mass lesion is identified.  IMPRESSION: Normal CT of the head without and with contrast.  Original Report Authenticated By: Elsie Stain, M.D.   Original Report Authenticated By: Camelia Phenes, M.D.    Dg Chest Port 1 View  08/26/2011  *RADIOLOGY REPORT*  Clinical Data: Altered mental status  PORTABLE CHEST - 1 VIEW  Comparison:  08/24/2011  Findings: Moderate cardiomegaly.  Low volumes. Bibasilar atelectasis. Normal vascularity.  IMPRESSION: Cardiomegaly and bibasilar atelectasis. No sign of pulmonary edema.  Original Report Authenticated By: Donavan Burnet, M.D.      Assessment/Plan Present on Admission:  **None**  1. Leukocytosis: Unclear etiology. Urine analysis is not suggestive of urinary tract infection and chest x-ray does not show any acute process. In the absence of fever or symptomatology suggesting a possible infection, would not start on any antimicrobial therapy. Recommend repeating CBC in a couple of days or earlier if patient develops symptoms and signs of infection at which time further evaluation may be considered. 2. Altered mental status: Possibly secondary to primary psychiatric process that is schizophrenia. Recommend psychiatric evaluation and management. 3. Recently treated community acquired pneumonia: Patient completed one week of IV antibiotic therapy in hospital and chest x-ray had shown improvement. 4. History of pulmonary embolism/hypercoagulable state: Patient is therapeutically anticoagulated. Continue Coumadin therapy. 5. Hypertension: Patient had elevated blood pressures on initial evaluation but has improved. Continue home dose of metoprolol.  At this time patient does not meet criteria for inpatient medical admission. The hospitalist service will sign off at this time. Please reconsult for any further assistance.  Tyan Dy 08/27/2011, 12:21 AM

## 2011-08-27 NOTE — BHH Counselor (Signed)
ACT sent fax to Attention: Okey Regal of [facility]. IVC and face sheet.  Counselor discussed UTI concern with nurse and a lab was ordered for a urinalysis to obtain results.

## 2011-08-27 NOTE — ED Notes (Signed)
Pt. Asleep in bed, breathing WNL

## 2011-08-27 NOTE — BHH Counselor (Signed)
Pt to be reviewed in am by [facility].  Concerns are pt. Being medically cleared and pt. Having Adult MA, facility does not accept this insurance and pt. Will have to be self-pay.  ACT to follow up in the morning 5.28.13.

## 2011-08-27 NOTE — ED Notes (Signed)
Pt evaluated by hospitalist, transferred to [facility] for privacy during eval.

## 2011-08-27 NOTE — BH Assessment (Signed)
BHH Assessment Progress Note      Spoke with [email or web address](Behavioral Health Intake) who confirms that pt will be added to their Behavioral Health inpatient waitlist.

## 2011-08-27 NOTE — ED Notes (Signed)
Pt. In hallway at psych doors, wanting to get out.  Asked pt. To please go to her room, pt. Reluctant to go to room.  Gave pt. Drink/snack as long as it has "no bad stuff" in it.  Pt. Seen grabbing at something in the air that was not there.  Pt. Angry that she cannot leave.

## 2011-08-27 NOTE — ED Notes (Signed)
Pt attempted to leave, running down the hallway stating she was going home. Writer and NT where able to escort her back to the bed in [place] [facility]

## 2011-08-27 NOTE — Progress Notes (Signed)
ANTICOAGULATION CONSULT NOTE - Initial Consult  Pharmacy Consult for Warfarin Indication: Hx of PE  Allergies  Allergen Reactions  . Darvocet (Propoxyphene-Acetaminophen)     Patient Measurements: Height: 5\' 6"  (167.6 cm) IBW/kg (Calculated) : 59.3   Vital Signs: Temp: 98.6 F (37 C) (05/27 1023) BP: 125/86 mmHg (05/27 1339) Pulse Rate: 94  (05/27 1339)  Labs:  Basename 08/27/11 0915 08/27/11 0855 08/26/11 1950  HGB -- 14.1 14.3  HCT -- 41.1 42.3  PLT -- 503* 533*  APTT -- -- --  LABPROT 31.2* -- 30.6*  INR 2.95* -- 2.88*  HEPARINUNFRC -- -- --  CREATININE -- -- 1.15*  CKTOTAL -- -- --  CKMB -- -- --  TROPONINI -- -- --    The CrCl is unknown because both a height and weight (above a minimum accepted value) are required for this calculation.   Medical History: Past Medical History  Diagnosis Date  . Pulmonary emboli   . Schizophrenia     Medications:  Scheduled:    . aspirin  325 mg Oral Daily  . benztropine  1 mg Oral Daily  . gabapentin  400 mg Oral TID  . LORazepam  2 mg Intramuscular Once  . LORazepam  2 mg Intravenous Once  . metoprolol tartrate  12.5 mg Oral BID  . pantoprazole  40 mg Oral Q12H  . risperiDONE  2 mg Oral QHS  . Warfarin - Pharmacist Dosing Inpatient   Does not apply q1800  . ziprasidone  20 mg Intramuscular Once  . DISCONTD: warfarin  5 mg Oral Daily    Assessment:  41 yo female admit with AMS changes, hx of schizophrenia.  Chronic Coumadin for hx of PE, home dose 5mg  daily.  INR at high end of range, little po intake.  Goal of Therapy:  INR 2-3   Plan:  Hold on Coumadin today, Continue daily protimes.  Otho Bellows PharmD Pager [phone] 08/27/2011,3:28 PM

## 2011-08-27 NOTE — ED Notes (Signed)
Pt. Given lunch tray, would not eat this stating that we were putting things in it.  Pt. Offered sandwich.  Sandwich given and pt. Later stated that she would not eat this either because we were also putting something into it.  Offered pt. Soda  And she said that only if writer would pour it for her.  Pt. Does drink soda.

## 2011-08-27 NOTE — ED Notes (Signed)
Pt. Requested that writier witness conversation between pt. And someone sitting in chair.  Pt. + A/V/H.

## 2011-08-27 NOTE — ED Provider Notes (Signed)
She has been seen by medicine and medically cleared in addition to be medically cleared by the ED service. Currently, the patient is agitated, delusional, pacing, gesticulating- gun shooting with hand. She is at risk for elopment. She will be moved to the locked psychiatric unit. We'll initiate usual medications with when necessary agitation  medication.  Flint Melter, MD 08/27/11 [phone]

## 2011-08-27 NOTE — BH Assessment (Addendum)
BHH Assessment Progress Note      [facility] Requesting demographic sheet and wants to confirm if pt is being treated for UTI,IVC papers requested. Informed ACT counselor of this and she is in agreement to follow up with [facility] about information requested.

## 2011-08-27 NOTE — ED Notes (Signed)
1 Bag in locker 43.

## 2011-08-27 NOTE — BH Assessment (Signed)
BHH Assessment Progress Note      Spoke with Celeste at [facility] who reports that pt was declined for admission due to acuity.

## 2011-08-27 NOTE — BH Assessment (Signed)
BHH Assessment Progress Note   Due to no current appropriate bed availability at [facility], Pt referred to other facilities to include [facility]-pending,[facility]-pending, Spoke with Steward Drone at [facility] who reports no current beds available at this time. [facility]-no beds available at this time. [facility] is aware that pt is in need of placement and will notify ACT team when bed becomes available.

## 2011-08-27 NOTE — ED Notes (Signed)
Meal tray given 

## 2011-08-27 NOTE — ED Notes (Signed)
Spoke to [place] and gave report in [place] ED

## 2011-08-27 NOTE — ED Notes (Signed)
Pt. States that was praying out loud at home, her boyfriend, son and daughter got very angry and they didn't like it. Pt.'s family told her she had gone crazy.  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 Pt. States that she has contact with the [employer], [employer] and [employer] and states that [employer]. Obama drove here to [facility] and said to let her go, but we (staff) D/C'ed the microphone so he couldn't speak.  While interviewing pt., pt. Said that I was glowing. Pt. Also states that she can make dead people talk.

## 2011-08-28 LAB — PROTIME-INR
INR: 2.71 — ABNORMAL HIGH (ref 0.00–1.49)
Prothrombin Time: 29.2 seconds — ABNORMAL HIGH (ref 11.6–15.2)

## 2011-08-28 MED ORDER — WARFARIN SODIUM 5 MG PO TABS
5.0000 mg | ORAL_TABLET | Freq: Once | ORAL | Status: AC
  Administered 2011-08-28: 5 mg via ORAL
  Filled 2011-08-28: qty 1

## 2011-08-28 NOTE — ED Notes (Signed)
Pt has been accepted to [facility] for inpatient psychiatric treatment by Dr. Abel Presto. EDP has been notified and is in agreement with the disposition. RN made aware to call report. Pt is currently under IVC and will need to transport via sheriff. Pt is currently pending transport. No further needs identified at this time.

## 2011-08-28 NOTE — ED Notes (Signed)
Pt. Refused to eat bkft because we were putting things in it.  Offered pt. An alternative, asked for cheese and coke, gave to pt.  Pt. Ate snack/drink.

## 2011-08-28 NOTE — ED Notes (Signed)
Pt awake and moving around room in bizarre fashion. Pt noted to be moving invisible objects (such as furniture) around the room, and reaches to the floor for things that are not there. Pt refused Geodon, stating that she is allergic to it and breaks out in a rash when she took it last. Upon approach, pt is somewhat skeptical, but warms up once conversation is initiated. Refused HS meds and initially refused Ativan injection for delusions and paranoia, but allowed RN to administer injection with persuasion. Denies pain and denies further needs.

## 2011-08-28 NOTE — BHH Counselor (Signed)
Contacted [facility].  Nurse in charge is waiting to meet with doctor to review.

## 2011-08-28 NOTE — ED Provider Notes (Signed)
BP 124/85  Pulse 109  Temp(Src) 97.5 F (36.4 C) (Oral)  Resp 20  Ht 5\' 6"  (1.676 m)  SpO2 99%  LMP 08/17/2011 Pt remains agitated, hyper-religious, and delusional overnight. IVC. Awaiting placement.   Loren Racer, MD 08/28/11 [phone]

## 2011-08-28 NOTE — Progress Notes (Signed)
ANTICOAGULATION CONSULT NOTE - Follow-Up Consult  Pharmacy Consult for Warfarin Indication: Hx of PE  Allergies  Allergen Reactions  . Darvocet (Propoxyphene-Acetaminophen)     Patient Measurements: Height: 5\' 6"  (167.6 cm) IBW/kg (Calculated) : 59.3   Vital Signs: Temp: 97.5 F (36.4 C) (05/28 0650) Temp src: Oral (05/28 0650) BP: 124/85 mmHg (05/28 0650) Pulse Rate: 109  (05/28 0650)  Labs:  Alvira Philips 08/28/11 0445 08/27/11 0915 08/27/11 0855 08/26/11 1950  HGB -- -- 14.1 14.3  HCT -- -- 41.1 42.3  PLT -- -- 503* 533*  APTT -- -- -- --  LABPROT 29.2* 31.2* -- 30.6*  INR 2.71* 2.95* -- 2.88*  HEPARINUNFRC -- -- -- --  CREATININE -- -- -- 1.15*  CKTOTAL -- -- -- --  CKMB -- -- -- --  TROPONINI -- -- -- --    The CrCl is unknown because both a height and weight (above a minimum accepted value) are required for this calculation.   Medical History: Past Medical History  Diagnosis Date  . Pulmonary emboli   . Schizophrenia     Medications:  Scheduled:     . aspirin  325 mg Oral Daily  . benztropine  1 mg Oral Daily  . gabapentin  400 mg Oral TID  . metoprolol tartrate  12.5 mg Oral BID  . pantoprazole  40 mg Oral Q12H  . risperiDONE  2 mg Oral QHS  . Warfarin - Pharmacist Dosing Inpatient   Does not apply q1800  . DISCONTD: warfarin  5 mg Oral Daily    Assessment:  41 yo female admit with AMS changes, hx of schizophrenia.  Chronic Coumadin for hx of PE, home dose reportedly 5mg  daily.  Warfarin held yesterday because INR was rising and near upper end of therapeutic range.  INR improved today.  Goal of Therapy:  INR 2-3   Plan:  Resume warfarin with 5mg  x1 PO today. Continue daily INR.  Hope Budds PharmD, BCPS Pager [phone] 08/28/2011,8:36 AM

## 2011-08-28 NOTE — ED Notes (Signed)
Pt transferred to [facility], transported by [facility]. Departs in safe and stable condition.

## 2011-09-14 ENCOUNTER — Emergency Department (HOSPITAL_COMMUNITY)
Admission: EM | Admit: 2011-09-14 | Discharge: 2011-09-19 | Disposition: A | Discharge status: Other Acute Inpatient Hospital | Payer: Medicaid Other | Attending: Emergency Medicine | Admitting: Emergency Medicine

## 2011-09-14 ENCOUNTER — Emergency Department (HOSPITAL_COMMUNITY): Payer: Medicaid Other

## 2011-09-14 ENCOUNTER — Encounter (HOSPITAL_COMMUNITY): Payer: Self-pay

## 2011-09-14 DIAGNOSIS — Z86718 Personal history of other venous thrombosis and embolism: Secondary | ICD-10-CM | POA: Insufficient documentation

## 2011-09-14 DIAGNOSIS — F29 Unspecified psychosis not due to a substance or known physiological condition: Secondary | ICD-10-CM | POA: Insufficient documentation

## 2011-09-14 DIAGNOSIS — Z7901 Long term (current) use of anticoagulants: Secondary | ICD-10-CM | POA: Insufficient documentation

## 2011-09-14 DIAGNOSIS — R42 Dizziness and giddiness: Secondary | ICD-10-CM | POA: Insufficient documentation

## 2011-09-14 DIAGNOSIS — F209 Schizophrenia, unspecified: Secondary | ICD-10-CM | POA: Insufficient documentation

## 2011-09-14 LAB — PROTIME-INR
INR: 4.7 — ABNORMAL HIGH (ref 0.00–1.49)
INR: 5.49 (ref 0.00–1.49)
Prothrombin Time: 44.9 seconds — ABNORMAL HIGH (ref 11.6–15.2)
Prothrombin Time: 50.7 seconds — ABNORMAL HIGH (ref 11.6–15.2)

## 2011-09-14 LAB — URINALYSIS, ROUTINE W REFLEX MICROSCOPIC
Bilirubin Urine: NEGATIVE
Ketones, ur: NEGATIVE mg/dL
Leukocytes, UA: NEGATIVE
Nitrite: NEGATIVE
Specific Gravity, Urine: 1.017 (ref 1.005–1.030)
Urobilinogen, UA: 0.2 mg/dL (ref 0.0–1.0)
pH: 5.5 (ref 5.0–8.0)

## 2011-09-14 LAB — DIFFERENTIAL
Basophils Relative: 1 % (ref 0–1)
Eosinophils Relative: 2 % (ref 0–5)
Lymphocytes Relative: 22 % (ref 12–46)
Monocytes Relative: 11 % (ref 3–12)
Neutrophils Relative %: 64 % (ref 43–77)

## 2011-09-14 LAB — CBC
Hemoglobin: 12.7 g/dL (ref 12.0–15.0)
Platelets: 526 10*3/uL — ABNORMAL HIGH (ref 150–400)
RBC: 4.17 MIL/uL (ref 3.87–5.11)
WBC: 15.1 10*3/uL — ABNORMAL HIGH (ref 4.0–10.5)

## 2011-09-14 LAB — BASIC METABOLIC PANEL
CO2: 24 mEq/L (ref 19–32)
Glucose, Bld: 138 mg/dL — ABNORMAL HIGH (ref 70–99)
Potassium: 3.4 mEq/L — ABNORMAL LOW (ref 3.5–5.1)
Sodium: 132 mEq/L — ABNORMAL LOW (ref 135–145)

## 2011-09-14 LAB — URINE MICROSCOPIC-ADD ON

## 2011-09-14 LAB — RAPID URINE DRUG SCREEN, HOSP PERFORMED
Barbiturates: NOT DETECTED
Cocaine: NOT DETECTED
Opiates: POSITIVE — AB
Tetrahydrocannabinol: NOT DETECTED

## 2011-09-14 LAB — ACETAMINOPHEN LEVEL: Acetaminophen (Tylenol), Serum: <15 ug/mL (ref 10–30)

## 2011-09-14 MED ORDER — ONDANSETRON HCL 4 MG PO TABS: 4.0000 mg | ORAL_TABLET | Freq: Three times a day (TID) | ORAL | Status: DC | PRN

## 2011-09-14 MED ORDER — ALUM & MAG HYDROXIDE-SIMETH 200-200-20 MG/5ML PO SUSP: 30.0000 mL | ORAL | Status: DC | PRN

## 2011-09-14 MED ORDER — RISPERIDONE 2 MG PO TABS
4.0000 mg | ORAL_TABLET | Freq: Every day | ORAL | Status: DC
  Administered 2011-09-18 (×2): 4 mg via ORAL
  Filled 2011-09-14: qty 2
  Filled 2011-09-14: qty 1
  Filled 2011-09-14 (×3): qty 2

## 2011-09-14 MED ORDER — IBUPROFEN 200 MG PO TABS: 600.0000 mg | ORAL_TABLET | Freq: Three times a day (TID) | ORAL | Status: DC | PRN

## 2011-09-14 MED ORDER — LORAZEPAM 1 MG PO TABS
1.0000 mg | ORAL_TABLET | Freq: Three times a day (TID) | ORAL | Status: DC | PRN
  Filled 2011-09-14: qty 1

## 2011-09-14 MED ORDER — WARFARIN SODIUM 2.5 MG PO TABS: 2.5000 mg | ORAL_TABLET | ORAL | Status: DC

## 2011-09-14 MED ORDER — ACETAMINOPHEN 325 MG PO TABS: 650.0000 mg | ORAL_TABLET | Freq: Four times a day (QID) | ORAL | Status: DC | PRN

## 2011-09-14 MED ORDER — NICOTINE 21 MG/24HR TD PT24
21.0000 mg | MEDICATED_PATCH | Freq: Every day | TRANSDERMAL | Status: DC
  Administered 2011-09-14 – 2011-09-19 (×6): 21 mg via TRANSDERMAL
  Filled 2011-09-14 (×6): qty 1

## 2011-09-14 MED ORDER — DIAZEPAM 5 MG PO TABS
5.0000 mg | ORAL_TABLET | Freq: Once | ORAL | Status: DC
  Filled 2011-09-14 (×4): qty 1

## 2011-09-14 MED ORDER — DIAZEPAM 5 MG PO TABS
5.0000 mg | ORAL_TABLET | Freq: Three times a day (TID) | ORAL | Status: DC | PRN
  Administered 2011-09-14 – 2011-09-19 (×9): 5 mg via ORAL
  Filled 2011-09-14 (×5): qty 1

## 2011-09-14 MED ORDER — ZOLPIDEM TARTRATE 5 MG PO TABS
5.0000 mg | ORAL_TABLET | Freq: Every evening | ORAL | Status: DC | PRN
  Filled 2011-09-14: qty 1

## 2011-09-14 MED ORDER — BENZTROPINE MESYLATE 1 MG PO TABS
1.0000 mg | ORAL_TABLET | Freq: Every day | ORAL | Status: DC
  Administered 2011-09-14 – 2011-09-19 (×3): 1 mg via ORAL
  Filled 2011-09-14 (×5): qty 1

## 2011-09-14 MED ORDER — WARFARIN - PHARMACIST DOSING INPATIENT: Freq: Every day | Status: DC

## 2011-09-14 NOTE — ED Notes (Signed)
md and charge rn alerted of pts critical value

## 2011-09-14 NOTE — ED Notes (Signed)
[id number]> Expected date:<BR> Expected time:<BR> Means of arrival:<BR> Comments:<BR> Hold for room 4

## 2011-09-14 NOTE — Progress Notes (Signed)
ANTICOAGULATION CONSULT NOTE - Initial Consult  Pharmacy Consult for Warfarin Indication: Hx of PE  Allergies  Allergen Reactions  . Darvocet (Propoxyphene-Acetaminophen)     Patient Measurements: Height: 5' 6.5" (168.9 cm) Weight: 189 lb (85.73 kg) IBW/kg (Calculated) : 60.45   Vital Signs: Temp: 98.3 F (36.8 C) (06/14 1054) Temp src: Oral (06/14 1054) BP: 140/89 mmHg (06/14 1054) Pulse Rate: 118  (06/14 1054)  Labs:  Basename 09/14/11 1153  HGB 12.7  HCT 37.9  PLT 526*  APTT --  LABPROT 44.9*  INR 4.70*  HEPARINUNFRC --  CREATININE 1.07  CKTOTAL --  CKMB --  TROPONINI --    Estimated Creatinine Clearance: 76.3 ml/min (by C-G formula based on Cr of 1.07).   Medical History: Past Medical History  Diagnosis Date  . Pulmonary emboli   . Schizophrenia     Medications:  Scheduled:    . benztropine  1 mg Oral Daily  . nicotine  21 mg Transdermal Daily  . risperidone  4 mg Oral QHS  . Warfarin - Pharmacist Dosing Inpatient   Does not apply q1800  . DISCONTD: warfarin  2.5-5 mg Oral See admin instructions  . DISCONTD: warfarin  2.5-5 mg Oral See admin instructions   Infusions:    Assessment: 41 yo F on warfarin prior to admission for hx of PE (per 08/18/11 H&P, no H&P available yet for this admission). Admit INR is supratherapeutic. No bleeding events reported in chart.  Goal of Therapy:  INR 2-3   Plan:  1) No warfarin tonight 2) F/U daily INR 3) Plan to resume warfarin dosing once INR <3  Darrol Angel, PharmD Pager: [phone] 09/14/2011,2:59 PM

## 2011-09-14 NOTE — ED Notes (Signed)
Boyfriend (live together)-- Joy [phone]-- call if any concerns-- needs to go home.

## 2011-09-14 NOTE — ED Notes (Signed)
Patient was discharged from [facility] 4 days ago for readjustment of psychiatric meds. Patient was given a new pain medication and states that she is now jittery and dizzy.

## 2011-09-14 NOTE — ED Notes (Signed)
Pt returned to room form CT.

## 2011-09-14 NOTE — ED Notes (Signed)
Patient states she took hydrocodone x 1 tablet at 1030 prior to coming to the ED.

## 2011-09-14 NOTE — ED Provider Notes (Signed)
History     CSN: [id number]  Arrival date & time 09/14/11  1025   First MD Initiated Contact with Patient 09/14/11 1111      Chief Complaint  Patient presents with  . Dizziness    received new pain medications    (Consider location/radiation/quality/duration/timing/severity/associated sxs/prior treatment) HPI Hx from pt and significant other. 41yo F with PMH schizophrenia and PEs presents with c/o dizziness and jitteriness. Pt was just released from [facility] 5 days ago after a stay for adjustment of her psychiatric medications. She states that her pain medications were also changed during that hospitalization, which she apparently takes for back pain. Since getting home, she has felt as if the medication is making her "jittery" and "dizzy." She reports that she has not eaten much for the past several days. She took a tablet of her "old" pain medication, Norco, just prior to arrival and stated she felt somewhat better with this. Also states "I feel like my body is splitting in half and I'm 2 different people."  I spoke with the pt's significant other outside of the patient's room. He states that the patient has been acting erratically since she came home. She has not slept at all in 2 days and has been pacing the floors. He has seen her talking to the walls and has been expressing some religious delusions. He is concerned that she has not been taking her medications as prescribed. She has not expressed any SI/HI.  Past Medical History  Diagnosis Date  . Pulmonary emboli   . Schizophrenia     Past Surgical History  Procedure Date  . No past surgeries     History reviewed. No pertinent family history.  History  Substance Use Topics  . Smoking status: Current Everyday Smoker -- 3.0 packs/day for 25 years    Types: Cigarettes  . Smokeless tobacco: Never Used  . Alcohol Use: No    OB History    Grav Para Term Preterm Abortions TAB SAB Ect Mult Living        Review of Systems  Constitutional: Negative for fever, chills, activity change and appetite change.  Respiratory: Negative for cough and shortness of breath.   Cardiovascular: Negative for chest pain.  Gastrointestinal: Negative for nausea, vomiting and abdominal pain.  Musculoskeletal: Negative for myalgias.  Neurological: Positive for light-headedness. Negative for syncope and headaches.    Allergies  Darvocet  Home Medications   Current Outpatient Rx  Name Route Sig Dispense Refill  . ACETAMINOPHEN 325 MG PO TABS Oral Take 2 tablets (650 mg total) by mouth every 6 (six) hours as needed (or Fever >/= 101).    . ASPIRIN 325 MG PO TABS Oral Take 325 mg by mouth daily.      Marland Kitchen BENZTROPINE MESYLATE 1 MG PO TABS Oral Take 1 mg by mouth daily.     . CYCLOBENZAPRINE HCL 10 MG PO TABS Oral Take 10 mg by mouth 3 (three) times daily as needed. For pain.    Marland Kitchen DIAZEPAM 5 MG PO TABS Oral Take 5 mg by mouth every 8 (eight) hours as needed. For anxiety.    Marland Kitchen HYDROCODONE-ACETAMINOPHEN 5-325 MG PO TABS Oral Take 1 tablet by mouth every 8 (eight) hours as needed for pain. 15 tablet 0  . RISPERIDONE 4 MG PO TABS Oral Take 4 mg by mouth at bedtime.    . WARFARIN SODIUM 5 MG PO TABS Oral Take 2.5-5 mg by mouth See admin instructions. Alternates between 5mg   and 2.5mg  daily.      BP 121/76  Pulse 97  Temp 97.7 F (36.5 C) (Oral)  Resp 18  Ht 5' 6.5" (1.689 m)  Wt 189 lb (85.73 kg)  BMI 30.05 kg/m2  SpO2 98%  LMP 08/17/2011 Vitals from previous visit reviewed - pt was persistently tachycardic during that visit as well. HR decreased over course of visit  Physical Exam  Nursing note and vitals reviewed. Constitutional: She is oriented to person, place, and time. She appears well-developed and well-nourished. No distress.  HENT:  Head: Normocephalic and atraumatic.  Mouth/Throat: Oropharynx is clear and moist. No oropharyngeal exudate.  Eyes: EOM are normal. Pupils are equal, round, and  reactive to light.  Neck: Normal range of motion.  Cardiovascular: Normal rate, regular rhythm and normal heart sounds.   Pulmonary/Chest: Effort normal and breath sounds normal. She exhibits no tenderness.  Abdominal: Soft. Bowel sounds are normal. There is no tenderness.  Genitourinary:       RN chaperone present during exam No gross blood on digital rectal exam  Musculoskeletal: Normal range of motion.  Neurological: She is alert and oriented to person, place, and time. No cranial nerve deficit. She exhibits normal muscle tone. Coordination normal.  Skin: Skin is warm and dry. She is not diaphoretic.  Psychiatric: She has a normal mood and affect. Her speech is rapid and/or pressured.       Pt fidgeting, pacing in room    ED Course  Procedures (including critical care time)  Labs Reviewed  CBC - Abnormal; Notable for the following:    WBC 15.1 (*)     Platelets 526 (*)     All other components within normal limits  DIFFERENTIAL - Abnormal; Notable for the following:    Neutro Abs 9.6 (*)     Monocytes Absolute 1.7 (*)     Basophils Absolute 0.2 (*)     All other components within normal limits  BASIC METABOLIC PANEL - Abnormal; Notable for the following:    Sodium 132 (*)     Potassium 3.4 (*)     Chloride 94 (*)     Glucose, Bld 138 (*)     GFR calc non Af Amer 63 (*)     GFR calc Af Amer 73 (*)     All other components within normal limits  PROTIME-INR - Abnormal; Notable for the following:    Prothrombin Time 44.9 (*)  SPECIMEN CHECKED FOR CLOTS   INR 4.70 (*)     All other components within normal limits  URINALYSIS, ROUTINE W REFLEX MICROSCOPIC - Abnormal; Notable for the following:    APPearance CLOUDY (*)     Hgb urine dipstick SMALL (*)     All other components within normal limits  URINE RAPID DRUG SCREEN (HOSP PERFORMED) - Abnormal; Notable for the following:    Opiates POSITIVE (*)     Benzodiazepines POSITIVE (*)     All other components within normal  limits  URINE MICROSCOPIC-ADD ON - Abnormal; Notable for the following:    Squamous Epithelial / LPF FEW (*)     Bacteria, UA MANY (*)     All other components within normal limits  PREGNANCY, URINE  ETHANOL  ACETAMINOPHEN LEVEL  OCCULT BLOOD, POC DEVICE  PROTIME-INR   No results found.   No diagnosis found.    MDM  Pt with c/o dizziness, hx schizophrenia, recently released from [facility]. Pt noted to be pacing in room, SO states she  has not slept in past several days. Concern that she may be manic given this. Screening labs generally unremarkable with the exception of INR supratherapeutic at 4.7. No evidence of bleeding; negative hemoccult. Coumadin dosing per pharmacy consult. Discussed pt with ACT - they will consult. Case d/w Dr. Brooke Dare.   Grant Fontana, PA-C 09/14/11 1704  6:30 PM Patient had been moved to the psychiatric emergency department pending placement and evaluation by ACT. Per the nursing staff, she seemed slightly sleepy. She did receive Valium around 4 PM just prior to being moved to the psych ED. On my exam, the patient is sleepy but arousable. She answers commands and can hold a conversation. Her INR is supratherapeutic so we will plan to proceed with head CT to rule out spontaneous hemorrhage.  8:00 PM Patient's head CT has resulted in does not show signs of acute hemorrhage. On repeat evaluation, the patient is more alert than before. Patient's Coumadin will be monitored, again, this is per pharmacy recommendations.  Grant Fontana, PA-C 09/14/11 2033

## 2011-09-14 NOTE — ED Notes (Addendum)
Pt refused head CT, EDPA Santina Evans made aware and over to see pt pt reports that she was brought here by co-workers sts that she has cancer in her stomach and that it was spreading and that she was leaving it to GOD. Pt agreed for GOD to do the scan. Pt off the floor to CT
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
## 2011-09-15 LAB — BASIC METABOLIC PANEL
GFR calc Af Amer: 68 mL/min — ABNORMAL LOW (ref >90)
GFR calc non Af Amer: 58 mL/min — ABNORMAL LOW (ref >90)
Potassium: 3.3 mEq/L — ABNORMAL LOW (ref 3.5–5.1)
Sodium: 139 mEq/L (ref 135–145)

## 2011-09-15 LAB — CBC
MCHC: 33.2 g/dL (ref 30.0–36.0)
RDW: 15.4 % (ref 11.5–15.5)
WBC: 8.8 10*3/uL (ref 4.0–10.5)

## 2011-09-15 MED ORDER — HYDROCODONE-ACETAMINOPHEN 5-325 MG PO TABS
1.0000 | ORAL_TABLET | Freq: Three times a day (TID) | ORAL | Status: DC | PRN
  Administered 2011-09-15 – 2011-09-19 (×8): 1 via ORAL
  Filled 2011-09-15 (×8): qty 1

## 2011-09-15 MED ORDER — PHYTONADIONE 5 MG PO TABS
2.5000 mg | ORAL_TABLET | Freq: Once | ORAL | Status: AC
  Administered 2011-09-15: 2.5 mg via ORAL
  Filled 2011-09-15: qty 1

## 2011-09-15 MED ORDER — POTASSIUM CHLORIDE CRYS ER 20 MEQ PO TBCR
40.0000 meq | EXTENDED_RELEASE_TABLET | Freq: Once | ORAL | Status: AC
  Administered 2011-09-15: 40 meq via ORAL
  Filled 2011-09-15: qty 2

## 2011-09-15 NOTE — ED Provider Notes (Signed)
  Physical Exam  BP 113/71  Pulse 105  Temp 98.6 F (37 C) (Oral)  Resp 20  Ht 5' 6.5" (1.689 m)  Wt 189 lb (85.73 kg)  BMI 30.05 kg/m2  SpO2 98%  LMP 08/17/2011  Physical Exam  ED Course  Procedures  Patient is awake. She was informed of the electrolyte and INR abnormalities. Is informed that blood work will be checked again. Patient without complaints except for requesting pain medications. [facility] will run patient once her labs are improved.      Juliet Rude. Rubin Payor, MD 09/15/11 [phone]

## 2011-09-15 NOTE — ED Provider Notes (Signed)
Pt stable overnight.  [facility] concerned about lab values of low sodium, potassium, and potential for UTI.  I reviewed labs, and noted her INR was increasing despite not being given coumadin.  D/w pharmacy, and will give 2.5 mg Vit K, recheck labs at 1800.  Will send urine for culture, give additional potassium.   Olivia Mackie, MD 09/15/11 [phone]

## 2011-09-15 NOTE — ED Notes (Signed)
Up to the desk on the phone 

## 2011-09-15 NOTE — ED Notes (Signed)
Up to the bathroom 

## 2011-09-15 NOTE — ED Provider Notes (Signed)
Medical screening examination/treatment/procedure(s) were performed by non-physician practitioner and as supervising physician I was immediately available for consultation/collaboration.   Dayton Bailiff, MD 09/15/11 [phone]

## 2011-09-15 NOTE — ED Notes (Signed)
Pt up to bathroom, inquired about pain medicine since it's been awhile since EDP seen her this morning and said she could have pain medicine. RN currently getting report on pt and will attend to this matter asap.

## 2011-09-15 NOTE — ED Provider Notes (Signed)
INR at 20:04 was 2.57, therapeutic, not elevated. Treatment was repeated has improved. Sodium 139 potassium 3.3, low, and creatinine, 1.14 somewhat elevated.   The patient takes Coumadin for history of pulmonary embolus. Will restart her Coumadin dosing tomorrow. Her dosing is, unclear. Will ask pharmacy for assistance.    Flint Melter, MD 09/15/11 2340

## 2011-09-15 NOTE — Progress Notes (Signed)
ANTICOAGULATION CONSULT NOTE - Initial Consult  Pharmacy Consult for Warfarin Indication: Hx of PE  Allergies  Allergen Reactions  . Darvocet (Propoxyphene-Acetaminophen)     Patient Measurements: Height: 5' 6.5" (168.9 cm) Weight: 189 lb (85.73 kg) IBW/kg (Calculated) : 60.45   Vital Signs: Temp: 98.6 F (37 C) (06/15 0605) Temp src: Oral (06/15 0605) BP: 113/71 mmHg (06/15 0605) Pulse Rate: 105  (06/15 0605)  Labs:  Basename 09/14/11 1730 09/14/11 1153  HGB -- 12.7  HCT -- 37.9  PLT -- 526*  APTT -- --  LABPROT 50.7* 44.9*  INR 5.49* 4.70*  HEPARINUNFRC -- --  CREATININE -- 1.07  CKTOTAL -- --  CKMB -- --  TROPONINI -- --    Estimated Creatinine Clearance: 76.3 ml/min (by C-G formula based on Cr of 1.07).   Medical History: Past Medical History  Diagnosis Date  . Pulmonary emboli   . Schizophrenia     Medications:  Scheduled:     . benztropine  1 mg Oral Daily  . diazepam  5 mg Oral Once  . nicotine  21 mg Transdermal Daily  . phytonadione  2.5 mg Oral Once  . potassium chloride  40 mEq Oral Once  . risperidone  4 mg Oral QHS  . Warfarin - Pharmacist Dosing Inpatient   Does not apply q1800  . DISCONTD: warfarin  2.5-5 mg Oral See admin instructions  . DISCONTD: warfarin  2.5-5 mg Oral See admin instructions    Assessment:  41 yo F on warfarin prior to admission for hx of PE.   Warfarin PTA was alternating doses of 2.5mg  and 5mg .  Last took 2.5mg  on 09/14/11.  Admit INR is supratherapeutic.   CT head w/o hemorrhage, infarction, mass lesion, or other abnormality  INR increased, despite holding warfarin.  Vitamin K 2.5mg  PO given x1 dose 6/15  No bleeding events reported in chart.  Goal of Therapy:  INR 2-3   Plan:   No warfarin tonight  F/U daily INR  Plan to resume warfarin dosing once INR <3  Lynann Beaver PharmD, BCPS Pager [phone] 09/15/2011 6:54 AM

## 2011-09-16 LAB — URINE CULTURE: Culture  Setup Time: 201306151111

## 2011-09-16 MED ORDER — ENOXAPARIN SODIUM 100 MG/ML ~~LOC~~ SOLN
1.0000 mg/kg | Freq: Two times a day (BID) | SUBCUTANEOUS | Status: DC
  Administered 2011-09-16: 85 mg via SUBCUTANEOUS
  Administered 2011-09-16: 80 mg via SUBCUTANEOUS
  Administered 2011-09-17 – 2011-09-19 (×5): 85 mg via SUBCUTANEOUS
  Filled 2011-09-16 (×8): qty 1

## 2011-09-16 MED ORDER — WARFARIN SODIUM 2.5 MG PO TABS
2.5000 mg | ORAL_TABLET | Freq: Once | ORAL | Status: AC
  Administered 2011-09-16: 2.5 mg via ORAL
  Filled 2011-09-16: qty 1

## 2011-09-16 NOTE — ED Notes (Signed)
Up to the bathroom 

## 2011-09-16 NOTE — ED Notes (Signed)
telepsych in progress 

## 2011-09-16 NOTE — ED Notes (Signed)
Pt's daughter into see 

## 2011-09-16 NOTE — ED Notes (Signed)
Up to the bathroom to shower and change clothes 

## 2011-09-16 NOTE — ED Provider Notes (Signed)
INR is now subtherapeutic. INR age. Patient is currently sleeping. Psychiatric consultation will be obtained today.  Dione Booze, MD 09/16/11 [phone]

## 2011-09-16 NOTE — ED Notes (Signed)
Up to the bathroom.  Pt reports her back pain has increased and is requesting a early dose of her hydrocodone.  Dr Weldon Inches updated-no change in schedule

## 2011-09-16 NOTE — Progress Notes (Signed)
ANTICOAGULATION CONSULT NOTE  Pharmacy Consult for Warfarin Indication: Hx of PE  Allergies  Allergen Reactions  . Darvocet (Propoxyphene-Acetaminophen)     Patient Measurements: Height: 5' 6.5" (168.9 cm) Weight: 189 lb (85.73 kg) IBW/kg (Calculated) : 60.45   Vital Signs: Temp: 98 F (36.7 C) (06/16 0403) Temp src: Oral (06/16 0403) BP: 125/84 mmHg (06/16 0403) Pulse Rate: 115  (06/16 0403)  Labs:  Basename 09/16/11 0526 09/15/11 1934 09/15/11 1931 09/14/11 1730 09/14/11 1153  HGB -- -- 12.4 -- 12.7  HCT -- -- 37.4 -- 37.9  PLT -- -- 429* -- 526*  APTT -- -- -- -- --  LABPROT 18.6* 28.0* -- 50.7* --  INR 1.52* 2.57* -- 5.49* --  HEPARINUNFRC -- -- -- -- --  CREATININE -- -- 1.14* -- 1.07  CKTOTAL -- -- -- -- --  CKMB -- -- -- -- --  TROPONINI -- -- -- -- --   Estimated Creatinine Clearance: 71.6 ml/min (by C-G formula based on Cr of 1.14).   Medical History: Past Medical History  Diagnosis Date  . Pulmonary emboli   . Schizophrenia     Medications:  Scheduled:     . benztropine  1 mg Oral Daily  . diazepam  5 mg Oral Once  . enoxaparin  1 mg/kg Subcutaneous Q12H  . nicotine  21 mg Transdermal Daily  . risperidone  4 mg Oral QHS  . Warfarin - Pharmacist Dosing Inpatient   Does not apply q1800    Assessment:  41 yo F on warfarin prior to admission for hx of PE.   Warfarin PTA was alternating doses of 2.5mg  and 5mg .  Last took 2.5mg  on 09/14/11.  Admit INR was supratherapeutic on 6/14 and increased 6/15, despite holding warfarin.  INR decreased to therapeutic level after Vitamin K 2.5mg  PO given x1 dose 6/15.  CT head w/o hemorrhage, infarction, mass lesion, or other abnormality  INR now sub-therapeutic (likely d/t vitamin K 6/15)  Lovenox bridge started today  CBC wnl; No bleeding or complications reported in chart.  Goal of Therapy:  INR 2-3   Plan:   Warfarin 2.5mg  PO today at 1800  Continue Lovenox as ordered  F/U daily  INR  Lynann Beaver PharmD, BCPS Pager [phone] 09/16/2011 9:07 AM

## 2011-09-16 NOTE — ED Notes (Signed)
Up to the phone 

## 2011-09-16 NOTE — ED Notes (Signed)
Friend in w/ pt 

## 2011-09-17 LAB — PROTIME-INR
INR: 1.15 (ref 0.00–1.49)
Prothrombin Time: 14.9 seconds (ref 11.6–15.2)

## 2011-09-17 MED ORDER — WARFARIN SODIUM 6 MG PO TABS
6.0000 mg | ORAL_TABLET | Freq: Once | ORAL | Status: AC
  Administered 2011-09-17: 6 mg via ORAL
  Filled 2011-09-17: qty 1

## 2011-09-17 NOTE — ED Notes (Addendum)
Pt obsessing over blood thinner medications, states she uses one to help with the dizziness, pt constantly at desk requesting her injection, pt advised injection is not used for dizziness and it is not due until later in the shift, pt advised if she is feeling dizzy she needs to go and lie in her bed because that is the best place for her to be in case she has an episode of syncope. Pt complied and went into room lying in bed. Pt refused HS medications, states she is refusing all medications and "you can let the doctor know", pt states "I feel better so I don't need them". After writer left room pt up out of bed pacing floor in room and peeking out room door window.

## 2011-09-17 NOTE — [facility] Counselor (Signed)
The psychiatrist (Dr. Shela Commons) calls for updates regarding patients disposition. [facility] staff/AC under the impression that patient is pending discharge home. Dr. Shela Commons confirms this information with this Clinical research associate. As of 09/17/2011 @ 1412 writer is not aware that patient is up for discharge or going home. As of this morning 7am at shift change patient is pending [facility] and will be reviewed once labs are at a therapeutic level.

## 2011-09-17 NOTE — Progress Notes (Signed)
ANTICOAGULATION CONSULT NOTE  Pharmacy Consult for Warfarin Indication: Hx of PE  Allergies  Allergen Reactions  . Darvocet (Propoxyphene-Acetaminophen)     Patient Measurements: Height: 5' 6.5" (168.9 cm) Weight: 189 lb (85.73 kg) IBW/kg (Calculated) : 60.45   Vital Signs: Temp: 97.3 F (36.3 C) (06/17 0538) Temp src: Oral (06/17 0538) BP: 131/83 mmHg (06/17 0538) Pulse Rate: 85  (06/17 0538)  Labs:  Alvira Philips 09/17/11 0450 09/16/11 0526 09/15/11 1934 09/15/11 1931 09/14/11 1153  HGB -- -- -- 12.4 12.7  HCT -- -- -- 37.4 37.9  PLT -- -- -- 429* 526*  APTT -- -- -- -- --  LABPROT 14.9 18.6* 28.0* -- --  INR 1.15 1.52* 2.57* -- --  HEPARINUNFRC -- -- -- -- --  CREATININE -- -- -- 1.14* 1.07  CKTOTAL -- -- -- -- --  CKMB -- -- -- -- --  TROPONINI -- -- -- -- --   Estimated Creatinine Clearance: 71.6 ml/min (by C-G formula based on Cr of 1.14).   Medical History: Past Medical History  Diagnosis Date  . Pulmonary emboli   . Schizophrenia     Medications:  Scheduled:     . benztropine  1 mg Oral Daily  . diazepam  5 mg Oral Once  . enoxaparin  1 mg/kg Subcutaneous Q12H  . nicotine  21 mg Transdermal Daily  . risperidone  4 mg Oral QHS  . warfarin  2.5 mg Oral ONCE-1800  . Warfarin - Pharmacist Dosing Inpatient   Does not apply q1800    Assessment:  41 yo F on warfarin prior to admission for hx of PE.   Warfarin dose PTA = alternating doses of 2.5mg  and 5mg .   Admit INR was supratherapeutic on 6/14 and increased 6/15, despite holding warfarin.  INR decreased to therapeutic level after Vitamin K 2.5mg  PO given x 1 dose 6/15.  INR now at baseline (likely d/t vitamin K 6/15 and hold warfarin)  Lovenox 1mg /kg q12h started 6/16 for bridge while INR low  No CBC today, No bleeding/complications reported  Renal function wnl with crcl 72 ml/min  Goal of Therapy:  INR 2-3   Plan:   Warfarin 6 mg po x 1 tonight  Continue Lovenox as ordered  F/U daily  INR  Trinidad Ingle, Loma Messing PharmD 7:40 AM 09/17/2011

## 2011-09-17 NOTE — Consult Note (Signed)
Reason for Consult: Paranoid schizophrenia and noncompliance with medications Referring Physician: Dr. [name] is an 41 y.o. female.  HPI: Patient was seen and chart reviewed. Patient has chronic history of for paranoid schizophrenia and noncompliance with her medications. Patient was known from her previous [name] emergency department visitations. Patient reportedly not compliant with her medications and stated she was misdiagnosed and she want to believe diagnosed at this time. Patient states with her boyfriend. She also has a 42 years old son works outside the home. She has a 2 years old daughter. She also has a 35 years old daughter, who lives with her father. Patient has been delusional, paranoid, unable to cope with the her mental illness and medication management. Patient denies any suicidal or homicidal ideations, and she has no history of for substance abuse. Please review the chart for the further information  Past Medical History  Diagnosis Date  . Pulmonary emboli   . Schizophrenia     Past Surgical History  Procedure Date  . No past surgeries     History reviewed. No pertinent family history.  Social History:  reports that she has been smoking Cigarettes.  She has a 75 pack-year smoking history. She has never used smokeless tobacco. She reports that she does not drink alcohol or use illicit drugs.  Allergies:  Allergies  Allergen Reactions  . Darvocet (Propoxyphene-Acetaminophen)     Medications: I have reviewed the patient's current medications.  Results for orders placed during the hospital encounter of 09/14/11 (from the past 48 hour(s))  CBC     Status: Abnormal   Collection Time   09/15/11  7:31 PM      Component Value Range Comment   WBC 8.8  4.0 - 10.5 K/uL    RBC 4.06  3.87 - 5.11 MIL/uL    Hemoglobin 12.4  12.0 - 15.0 g/dL    HCT 16.1  09.6 - 04.5 %    MCV 92.1  78.0 - 100.0 fL    MCH 30.5  26.0 - 34.0 pg    MCHC 33.2  30.0 - 36.0 g/dL      RDW 40.9  81.1 - 91.4 %    Platelets 429 (*) 150 - 400 K/uL   BASIC METABOLIC PANEL     Status: Abnormal   Collection Time   09/15/11  7:31 PM      Component Value Range Comment   Sodium 139  135 - 145 mEq/L    Potassium 3.3 (*) 3.5 - 5.1 mEq/L    Chloride 102  96 - 112 mEq/L    CO2 26  19 - 32 mEq/L    Glucose, Bld 109 (*) 70 - 99 mg/dL    BUN 10  6 - 23 mg/dL    Creatinine, Ser 7.82 (*) 0.50 - 1.10 mg/dL    Calcium 9.2  8.4 - 95.6 mg/dL    GFR calc non Af Amer 58 (*) >90 mL/min    GFR calc Af Amer 68 (*) >90 mL/min   PROTIME-INR     Status: Abnormal   Collection Time   09/15/11  7:34 PM      Component Value Range Comment   Prothrombin Time 28.0 (*) 11.6 - 15.2 seconds RESULT CHECKED   INR 2.57 (*) 0.00 - 1.49   PROTIME-INR     Status: Abnormal   Collection Time   09/16/11  5:26 AM      Component Value Range Comment   Prothrombin Time  18.6 (*) 11.6 - 15.2 seconds    INR 1.52 (*) 0.00 - 1.49   PROTIME-INR     Status: Normal   Collection Time   09/17/11  4:50 AM      Component Value Range Comment   Prothrombin Time 14.9  11.6 - 15.2 seconds    INR 1.15  0.00 - 1.49     No results found.  No depression, No anxiety and Positive for mood swings and Paranoid delusions and noncompliance with medications Blood pressure 132/85, pulse 83, temperature 98.1 F (36.7 C), temperature source Oral, resp. rate 18, height 5' 6.5" (1.689 m), weight 189 lb (85.73 kg), last menstrual period 08/17/2011, SpO2 97.00%.   Assessment/Plan: Schizophrenia, paranoid,  Noncompliance  [facility] medication adjustment, Safety and stabilization. Patient was accepted and will be transferred as soon as bed is available.  [name],Joy R. 09/17/2011, 5:18 PM

## 2011-09-17 NOTE — [facility] Counselor (Signed)
[facility] will run once pt is medically clear and lab results are in normal range. As of 0600  results are at a therapeutic level.

## 2011-09-18 LAB — PROTIME-INR
INR: 1.15 (ref 0.00–1.49)
Prothrombin Time: 14.9 seconds (ref 11.6–15.2)

## 2011-09-18 MED ORDER — WARFARIN SODIUM 6 MG PO TABS
6.0000 mg | ORAL_TABLET | Freq: Once | ORAL | Status: AC
  Administered 2011-09-18: 6 mg via ORAL
  Filled 2011-09-18: qty 1

## 2011-09-18 NOTE — ED Notes (Signed)
Able to convince pt to take Risperdal medication, pt restless, pacing, talking to herself, paranoid about medications, standing in room in middle of floor, staring out of room door at times.

## 2011-09-18 NOTE — Progress Notes (Addendum)
ANTICOAGULATION CONSULT NOTE  Pharmacy Consult for Warfarin Indication: Hx of PE  Allergies  Allergen Reactions  . Darvocet (Propoxyphene-Acetaminophen)     Patient Measurements: Height: 5' 6.5" (168.9 cm) Weight: 189 lb (85.73 kg) IBW/kg (Calculated) : 60.45   Vital Signs: Temp: 98 F (36.7 C) (06/18 0525) Temp src: Oral (06/18 0525) BP: 119/84 mmHg (06/18 0525) Pulse Rate: 115  (06/18 0525)  Labs:  Alvira Philips 09/18/11 0740 09/17/11 0450 09/16/11 0526 09/15/11 1931  HGB -- -- -- 12.4  HCT -- -- -- 37.4  PLT -- -- -- 429*  APTT -- -- -- --  LABPROT 14.9 14.9 18.6* --  INR 1.15 1.15 1.52* --  HEPARINUNFRC -- -- -- --  CREATININE -- -- -- 1.14*  CKTOTAL -- -- -- --  CKMB -- -- -- --  TROPONINI -- -- -- --   Estimated Creatinine Clearance: 71.6 ml/min (by C-G formula based on Cr of 1.14).   Medical History: Past Medical History  Diagnosis Date  . Pulmonary emboli   . Schizophrenia     Medications:  Scheduled:     . benztropine  1 mg Oral Daily  . diazepam  5 mg Oral Once  . enoxaparin  1 mg/kg Subcutaneous Q12H  . nicotine  21 mg Transdermal Daily  . risperidone  4 mg Oral QHS  . warfarin  6 mg Oral ONCE-1800  . Warfarin - Pharmacist Dosing Inpatient   Does not apply q1800    Assessment:  41 yo F on warfarin prior to admission for hx of PE.   Warfarin dose PTA = alternating doses of 2.5mg  and 5mg .   Admit INR was supratherapeutic on 6/14 and increased 6/15, despite holding warfarin.  INR decreased to therapeutic level after Vitamin K 2.5mg  PO given x 1 dose 6/15.  INR now at baseline after resuming coumadin x 1 dose (likely d/t vitamin K 6/15 and hold warfarin)  Lovenox 1mg /kg q12h started 6/16 for bridge while INR low  No CBC today, No bleeding/complications reported  Renal function wnl with crcl 72 ml/min  Goal of Therapy:  INR 2-3   Plan:   Repeat Warfarin 6 mg po x 1 tonight  Continue Lovenox 85 mg SQ Q12h while INR < 2  F/U daily  INR  Scr with INR in morning to monitor renal function while on lovenox  Yolanda Huffstetler, Loma Messing PharmD 8:12 AM 09/18/2011

## 2011-09-18 NOTE — ED Notes (Signed)
Pt less allowed admin of HS medications, states she is going to take them now, continues to ask multiple questions during administration and paranoid about taking them. Pt not observed talking to herself today thus far and less paranoid today than yesterday.

## 2011-09-19 ENCOUNTER — Inpatient Hospital Stay (HOSPITAL_COMMUNITY)
Admission: AD | Admit: 2011-09-19 | Discharge: 2011-09-26 | DRG: 885 | Disposition: A | Discharge status: Home / Self Care | Payer: Medicaid Other | Attending: Psychiatry | Admitting: Psychiatry

## 2011-09-19 ENCOUNTER — Encounter (HOSPITAL_COMMUNITY): Payer: Self-pay | Admitting: Rehabilitation

## 2011-09-19 DIAGNOSIS — I8290 Acute embolism and thrombosis of unspecified vein: Secondary | ICD-10-CM

## 2011-09-19 DIAGNOSIS — I2699 Other pulmonary embolism without acute cor pulmonale: Secondary | ICD-10-CM

## 2011-09-19 DIAGNOSIS — Z9119 Patient's noncompliance with other medical treatment and regimen: Secondary | ICD-10-CM

## 2011-09-19 DIAGNOSIS — F209 Schizophrenia, unspecified: Secondary | ICD-10-CM

## 2011-09-19 DIAGNOSIS — F1210 Cannabis abuse, uncomplicated: Secondary | ICD-10-CM | POA: Diagnosis present

## 2011-09-19 DIAGNOSIS — Z7982 Long term (current) use of aspirin: Secondary | ICD-10-CM

## 2011-09-19 DIAGNOSIS — N390 Urinary tract infection, site not specified: Secondary | ICD-10-CM

## 2011-09-19 DIAGNOSIS — I7300 Raynaud's syndrome without gangrene: Secondary | ICD-10-CM

## 2011-09-19 DIAGNOSIS — F200 Paranoid schizophrenia: Principal | ICD-10-CM | POA: Diagnosis present

## 2011-09-19 DIAGNOSIS — Z91199 Patient's noncompliance with other medical treatment and regimen due to unspecified reason: Secondary | ICD-10-CM

## 2011-09-19 DIAGNOSIS — R0602 Shortness of breath: Secondary | ICD-10-CM

## 2011-09-19 DIAGNOSIS — Z7901 Long term (current) use of anticoagulants: Secondary | ICD-10-CM

## 2011-09-19 DIAGNOSIS — J189 Pneumonia, unspecified organism: Secondary | ICD-10-CM

## 2011-09-19 DIAGNOSIS — Z79899 Other long term (current) drug therapy: Secondary | ICD-10-CM

## 2011-09-19 DIAGNOSIS — R000 Tachycardia, unspecified: Secondary | ICD-10-CM

## 2011-09-19 DIAGNOSIS — Z9149 Other personal history of psychological trauma, not elsewhere classified: Secondary | ICD-10-CM

## 2011-09-19 DIAGNOSIS — F17200 Nicotine dependence, unspecified, uncomplicated: Secondary | ICD-10-CM | POA: Diagnosis present

## 2011-09-19 DIAGNOSIS — R42 Dizziness and giddiness: Secondary | ICD-10-CM

## 2011-09-19 DIAGNOSIS — K769 Liver disease, unspecified: Secondary | ICD-10-CM

## 2011-09-19 DIAGNOSIS — R0902 Hypoxemia: Secondary | ICD-10-CM

## 2011-09-19 DIAGNOSIS — E876 Hypokalemia: Secondary | ICD-10-CM | POA: Diagnosis present

## 2011-09-19 DIAGNOSIS — N912 Amenorrhea, unspecified: Secondary | ICD-10-CM

## 2011-09-19 DIAGNOSIS — Z720 Tobacco use: Secondary | ICD-10-CM

## 2011-09-19 DIAGNOSIS — D72829 Elevated white blood cell count, unspecified: Secondary | ICD-10-CM

## 2011-09-19 DIAGNOSIS — Z86711 Personal history of pulmonary embolism: Secondary | ICD-10-CM

## 2011-09-19 DIAGNOSIS — F4310 Post-traumatic stress disorder, unspecified: Secondary | ICD-10-CM | POA: Diagnosis present

## 2011-09-19 DIAGNOSIS — I38 Endocarditis, valve unspecified: Secondary | ICD-10-CM

## 2011-09-19 LAB — PROTIME-INR
INR: 1.47 (ref 0.00–1.49)
Prothrombin Time: 18.1 seconds — ABNORMAL HIGH (ref 11.6–15.2)

## 2011-09-19 MED ORDER — ALUM & MAG HYDROXIDE-SIMETH 200-200-20 MG/5ML PO SUSP: 30.0000 mL | ORAL | Status: DC | PRN

## 2011-09-19 MED ORDER — POTASSIUM CHLORIDE CRYS ER 20 MEQ PO TBCR
20.0000 meq | EXTENDED_RELEASE_TABLET | ORAL | Status: AC
  Administered 2011-09-19 – 2011-09-22 (×6): 20 meq via ORAL
  Filled 2011-09-19 (×6): qty 1

## 2011-09-19 MED ORDER — WARFARIN SODIUM 4 MG PO TABS
4.0000 mg | ORAL_TABLET | Freq: Once | ORAL | Status: AC
  Administered 2011-09-19: 4 mg via ORAL
  Filled 2011-09-19: qty 1

## 2011-09-19 MED ORDER — MAGNESIUM HYDROXIDE 400 MG/5ML PO SUSP
30.0000 mL | Freq: Every day | ORAL | Status: DC | PRN
  Administered 2011-09-24: 30 mL via ORAL

## 2011-09-19 MED ORDER — TRAZODONE HCL 100 MG PO TABS
100.0000 mg | ORAL_TABLET | Freq: Every evening | ORAL | Status: DC | PRN
  Filled 2011-09-19: qty 1

## 2011-09-19 MED ORDER — WARFARIN - PHARMACIST DOSING INPATIENT
Freq: Every day | Status: DC
  Filled 2011-09-19 (×15): qty 1

## 2011-09-19 MED ORDER — HYDROCODONE-ACETAMINOPHEN 5-325 MG PO TABS
1.0000 | ORAL_TABLET | Freq: Three times a day (TID) | ORAL | Status: DC | PRN
  Administered 2011-09-19 – 2011-09-25 (×16): 1 via ORAL
  Filled 2011-09-19 (×16): qty 1

## 2011-09-19 MED ORDER — RISPERIDONE 2 MG PO TABS
4.0000 mg | ORAL_TABLET | Freq: Every day | ORAL | Status: DC
  Administered 2011-09-19 – 2011-09-25 (×7): 4 mg via ORAL
  Filled 2011-09-19 (×4): qty 2
  Filled 2011-09-19: qty 10
  Filled 2011-09-19 (×4): qty 2

## 2011-09-19 MED ORDER — ENOXAPARIN SODIUM 100 MG/ML ~~LOC~~ SOLN
85.0000 mg | Freq: Two times a day (BID) | SUBCUTANEOUS | Status: DC
  Administered 2011-09-19 – 2011-09-20 (×3): 85 mg via SUBCUTANEOUS
  Administered 2011-09-21: 100 mg via SUBCUTANEOUS
  Administered 2011-09-21 – 2011-09-25 (×9): 85 mg via SUBCUTANEOUS
  Filled 2011-09-19 (×16): qty 1

## 2011-09-19 MED ORDER — ACETAMINOPHEN 325 MG PO TABS: 650.0000 mg | ORAL_TABLET | Freq: Four times a day (QID) | ORAL | Status: DC | PRN

## 2011-09-19 MED ORDER — DIAZEPAM 5 MG PO TABS
5.0000 mg | ORAL_TABLET | Freq: Three times a day (TID) | ORAL | Status: DC | PRN
  Administered 2011-09-19 – 2011-09-25 (×17): 5 mg via ORAL
  Filled 2011-09-19 (×17): qty 1

## 2011-09-19 NOTE — Progress Notes (Signed)
Pt. Denies lethality and A/V/H's: worries constantly about her medications and when she is to get them.  Pt is anxious about her new environment of care and required reassurance several times that she would be getting them as ordered. Pt. Is pleasant, if sad-looking and maintains a flat to blunted affect. Pt. Is appropriate with staff and peers and went to her room after taking her HS meds.

## 2011-09-19 NOTE — ED Notes (Signed)
Pt offered shower but said she did not like showering here because the water won't stay on.

## 2011-09-19 NOTE — ED Provider Notes (Signed)
Pt accepted at [facility] B. Bernette Mayers, MD 09/19/11 1143

## 2011-09-19 NOTE — Progress Notes (Signed)
[facility] Group Notes:  (Counselor/Nursing/MHT/Case Management/Adjunct)  09/19/2011 2:30 PM  Type of Therapy:  [employer] Support  Participation Level:  Did not attend    [name] C 09/19/2011, 2:30 PM   

## 2011-09-19 NOTE — Progress Notes (Signed)
ANTICOAGULATION CONSULT NOTE - Follow Up Consult  Pharmacy Consult for Coumadin Indication: VTE prophylaxis  Allergies  Allergen Reactions  . Darvocet (Propoxyphene-Acetaminophen)       Vital Signs: Temp: 97.8 F (36.6 C) (06/19 1315) Temp src: Oral (06/19 1315) BP: 114/81 mmHg (06/19 1316) Pulse Rate: 115  (06/19 1316)  Labs:  Basename 09/19/11 0810 09/18/11 0740 09/17/11 0450  HGB -- -- --  HCT -- -- --  PLT -- -- --  APTT -- -- --  LABPROT 18.1* 14.9 14.9  INR 1.47 1.15 1.15  HEPARINUNFRC -- -- --  CREATININE 1.01 -- --  CKTOTAL -- -- --  CKMB -- -- --  TROPONINI -- -- --    Estimated Creatinine Clearance: 85.3 ml/min (by C-G formula based on Cr of 1.01).   Medications:  Scheduled:    . enoxaparin (LOVENOX) injection  85 mg Subcutaneous Q12H  . warfarin  4 mg Oral ONCE-1800  . Warfarin - Pharmacist Dosing Inpatient   Does not apply q1800    Assessment:  Patient's INR is 1.47 today   History of PE  Coumadin dose at home was alternating 2.5 mg with 5 mg daily.  Goal of Therapy:  INR 2-3    Plan: Will give Coumadin 4 mg tonight and continue Lovenox 85 mg bid until INR therapeutic.  Will check INR in AM   Malva Cogan 09/19/2011,3:23 PM
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
## 2011-09-19 NOTE — Progress Notes (Signed)
Pt. States she wants to remain on all of her meds . Appears very nervous an apprehensive that all of her meds will be changed. Pt. Did go outdoors with the other pts. Good  Eye contact. Denies SI or Hi and contracts for safety. Pt. Denies hearing any voices or any visual hallucinations.
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
## 2011-09-20 DIAGNOSIS — F200 Paranoid schizophrenia: Principal | ICD-10-CM

## 2011-09-20 DIAGNOSIS — F4310 Post-traumatic stress disorder, unspecified: Secondary | ICD-10-CM

## 2011-09-20 MED ORDER — WARFARIN SODIUM 4 MG PO TABS
4.0000 mg | ORAL_TABLET | Freq: Once | ORAL | Status: AC
  Administered 2011-09-20: 4 mg via ORAL
  Filled 2011-09-20: qty 1

## 2011-09-20 MED ORDER — NICOTINE 21 MG/24HR TD PT24
21.0000 mg | MEDICATED_PATCH | Freq: Every day | TRANSDERMAL | Status: DC
  Administered 2011-09-20 – 2011-09-26 (×7): 21 mg via TRANSDERMAL
  Filled 2011-09-20 (×8): qty 1

## 2011-09-20 MED ORDER — NICOTINE 21 MG/24HR TD PT24
MEDICATED_PATCH | TRANSDERMAL | Status: AC
  Administered 2011-09-20: 21 mg via TRANSDERMAL
  Filled 2011-09-20: qty 1

## 2011-09-20 NOTE — Progress Notes (Signed)
D: Pt denies SI/HI/AVH. Pt affect and mood is anxious. When I asked pt about anxiety she states that she would rate her anxiety as a 5 and it is in her hands. When I asked what she meant by that she states that her hands tremor which indicate anxiety. Pt rates depression and hopelessness both as 0. When I asked pt about going outside she states that she does not want to go outside in the heat because her neck hurts. She rated her neck pain at 6/10. A: Support and encouragement offered to pt. Pain medication given for pain level 6. R: Reassessed pt. Pain level decreased to 3.

## 2011-09-20 NOTE — Progress Notes (Signed)
[facility] Group Notes:  (Counselor/Nursing/MHT/Case Management/Adjunct)  09/20/2011 12:30 PM  Type of Therapy: Group Therapy   Participation Level: Minimal   Participation Quality:  Sharing, Attentive   Affect: Blunted  Cognitive:  Oriented, Alert  Insight: Minimal  Engagement in Group: Limited  Modes of Intervention: Clarification, Education, Problem-solving, Socialization and Support   Summary of Progress/Problems: Pt actively participated in group focused on elevating mood and developing coping skills.  Therapist prompted patients to identify things that made them happy.  Pt explained that contact with her pet including a goat, dog and cat made her feel happy.  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 Therapist encouraged patient to add positive activities to their lives and associate with positive people.  Therapist offered support and encouragement.  Minimal Progress noted.  Intervention effective.    Marni Griffon 09/20/2011  12:30 PM

## 2011-09-20 NOTE — Progress Notes (Signed)
COLLATERAL NOTE:  3:45 PM  Therapist talked with Pt's long time boyfriend, and inquired about patient's baseline.  Boyfriend stated she had visited last night and Pt was "zoning out" by standing at the window talking to herself.  He stated this was definitely not baseline and reported she had only been on the medication 1 day prior to to admission here. He stated that Pt did not want her to be discharged until the medication had taken effect.  I informed him we would probably looking at the first of next week for dc pending improvement and stabilization on medication. I asked him to keep [place] informed and we would reciprocate.  I explained that Rod [place], Case Manager, was working on arranging for a community support agency to provide transportation to MD appts. He expressed gratitude.

## 2011-09-20 NOTE — Progress Notes (Signed)
Patient ID: [name], female   DOB: [birth year], 41 y.o.   MRN: [id number]   D: Patient has been pleasant on approach today. Reports anxiety and depression. Reports depression "3" and hopelessness "2". Currently denies any SI/HI or a/v hallucinations. Did report some bad dreams last night. Encouraged to take nicotine patch off during the night to help with bad dreams. Some anxiety about lovenox and coumadin but no high degree of paranoid.  A: Staff will monitor and encourage group attendance.  R: Taking meds without issue and cooperative on unit.
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
## 2011-09-20 NOTE — Progress Notes (Signed)
[facility] Group Notes:  (Counselor/Nursing/MHT/Case Management/Adjunct)  09/20/2011 10:30 AM  Type of Therapy: Group Therapy   Participation Level: Minimal   Participation Quality:  Sharing, Attentive   Affect: Blunted  Cognitive:  Oriented, Alert  Insight: Poor  Engagement in Group: Limited  Modes of Intervention: Clarification, Education, Problem-solving, Socialization and Support   Summary of Progress/Problems: Pt participated in group by listening attentively and self disclosing.  Therapist prompted Pts to report on progress with goals.  Pt acknowledged some progress.  Therapist prompted Pt to express feelings openly,  identify and work on the underlying issue.  Pt explained that she was anxious about getting her blood checked and had some difficulty getting to appts, due to her boyfriend working long hours on Holiday representative jobs. Therapist informed Pt that her case manager was working on Investment banker, operational to assist client.  Therapist offered support and encouragement.  Minimal Progress noted.  Intervention effective.    Marni Griffon 09/20/2011  10:30 AM

## 2011-09-20 NOTE — Treatment Plan (Signed)
Interdisciplinary Treatment Plan Update (Adult)  Date: 09/20/2011  Time Reviewed: 10:02 AM   Progress in Treatment: Attending groups: Yes Participating in groups: Yes Taking medication as prescribed: Yes Tolerating medication: Yes   Family/Significant other contact made: Counselor to contact Patient understands diagnosis:  Yes  As evidenced by asking for help with medication stabilization Discussing patient identified problems/goals with staff:  Yes  See below Medical problems stabilized or resolved:  Yes Denies suicidal/homicidal ideation: Yes  In tx team Issues/concerns per patient self-inventory:  None Other:  New problem(s) identified: N/A  Reason for Continuation of Hospitalization: Medication stabilization  Interventions implemented related to continuation of hospitalization: Continue Risperdal trial, encourage group attendance and participation  Additional comments:  Estimated length of stay: 1-2 days  Discharge Plan: Return home, follow up outpt. New goal(s): N/A  Review of initial/current patient goals per problem list:   1.  Goal(s): Eliminate paranoia, reduce psychosis to a manageable level  Met:  Yes  Target date:6/20  As evidenced [id number] is not demonstrating any thoughts of paranoia, and she denies A/H, V/H  2.  Goal (s): Identify outpt provider  Met:  No  Target date:6/21  As evidenced by:C/M to talk to patient about options and come up with a plan together  3.  Goal(s): Contact husband for collateral info  Met:  No  Target date:6/21  As evidenced by: Counselor to talk to boyfriend with [name]'s permission, and ascertain if she is at baseline level of functioning, as well as finding out any concerns of husband  4.  Goal(s):  Medication Management  Met:  No  Target date: by discharge  As evidenced by:  Will be stable for discharge when patient and doctor are satisfied with blood levels and with dosages of medications, results in controlling  symptoms  Attendees: Patient:  [name] 09/20/2011 10:02 AM  Family:     Physician:  Franchot Gallo, MD 09/20/2011 10:02 AM   Nursing:   Izola Price, RN 09/20/2011 10:02 AM   Case Manager:  Richelle Ito, LCSW 09/20/2011 10:02 AM   Counselor:  Marni Griffon, LCAS 09/20/2011 10:02 AM   Other:  Ambrose Mantle, LCSW 09/20/2011 10:02 AM  Other:   Manuela Schwartz, RN 09/20/2011 10:59 AM   Other:     Other:      Scribe for Treatment Team:   Ida Rogue, 09/20/2011 10:02 AM

## 2011-09-20 NOTE — Progress Notes (Signed)
ANTICOAGULATION CONSULT NOTE - Follow Up Consult  Pharmacy Consult for Coumadin  Indication: pulmonary embolus  Allergies  Allergen Reactions  . Darvocet (Propoxyphene-Acetaminophen)     Patient Measurements: Height: 5\' 5"  (165.1 cm) Weight: 222 lb (100.699 kg) IBW/kg (Calculated) : 57  Heparin Dosing Weight:  85 Kg  Vital Signs: Temp: 97.8 F (36.6 C) (06/20 0720) BP: 106/76 mmHg (06/20 0725) Pulse Rate: 120  (06/20 0725)  Labs:  Alvira Philips 09/20/11 0620 09/19/11 0810 09/18/11 0740  HGB -- -- --  HCT -- -- --  PLT -- -- --  APTT -- -- --  LABPROT 21.3* 18.1* 14.9  INR 1.81* 1.47 1.15  HEPARINUNFRC -- -- --  CREATININE -- 1.01 --  CKTOTAL -- -- --  CKMB -- -- --  TROPONINI -- -- --    Estimated Creatinine Clearance: 85.3 ml/min (by C-G formula based on Cr of 1.01).   Medications:  Scheduled:    . enoxaparin (LOVENOX) injection  85 mg Subcutaneous Q12H  . nicotine  21 mg Transdermal Q0600  . potassium chloride  20 mEq Oral BH-qamhs  . risperiDONE  4 mg Oral QHS  . warfarin  4 mg Oral ONCE-1800  . warfarin  4 mg Oral ONCE-1800  . Warfarin - Pharmacist Dosing Inpatient   Does not apply q1800    Assessment: INR has increased to 1.81 after 4 mg dose yesterday. No bleeding noted.  Goal of Therapy:  INR 2-3    Plan:  Will give Coumadin 4 mg today and recheck PT/INR tomorrow.  Malva Cogan 09/20/2011,11:09 AM
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
## 2011-09-20 NOTE — Progress Notes (Signed)
Patient resting quietly with eyes closed. Respirations even and unlabored. No distress noted. Q 15 minute check continues to maintain safety   

## 2011-09-21 LAB — CBC
HCT: 37.7 % (ref 36.0–46.0)
Hemoglobin: 12.3 g/dL (ref 12.0–15.0)
MCH: 30.6 pg (ref 26.0–34.0)
MCV: 93.8 fL (ref 78.0–100.0)
Platelets: 336 10*3/uL (ref 150–400)
RBC: 4.02 MIL/uL (ref 3.87–5.11)

## 2011-09-21 MED ORDER — WARFARIN SODIUM 6 MG PO TABS
6.0000 mg | ORAL_TABLET | Freq: Once | ORAL | Status: AC
  Administered 2011-09-21: 6 mg via ORAL
  Filled 2011-09-21: qty 1

## 2011-09-21 NOTE — Progress Notes (Signed)
Psychoeducational Group Note  Date:  09/21/2011 Time:  2035  Group Topic/Focus:  Wrap-Up Group:   The focus of this group is to help patients review their daily goal of treatment and discuss progress on daily workbooks.  Participation Level:  Minimal  Participation Quality:  Appropriate and Sharing  Affect:  Appropriate and Flat  Cognitive:  Appropriate  Insight:  Limited  Engagement in Group:  Limited  Additional Comments:  Pt attended wrap up group this evening.  Pt participation was limited in group.  Pt occupied with speaking to the RN during group.  Aundria Rud, Chadric Kimberley L 09/21/2011, 8:36 PM

## 2011-09-21 NOTE — Progress Notes (Signed)
Recreation Therapy Notes  09/21/2011         Time: 0930      Group Topic/Focus: The focus of the group is on enhancing the patients' ability to cope with stressors by understanding what coping is, why it is important, the negative effects of stress and developing healthier coping skills. Patients practice [place] and discuss how exercise can be used as a healthy coping strategy.   Participation Level: None  Participation Quality: Drowsy  Affect: Blunted  Cognitive: Unknown   Additional Comments: Patient slept throughout group.    Liddy Deam 09/21/2011 11:51 AM

## 2011-09-21 NOTE — Progress Notes (Signed)
[facility] Group Notes:  (Counselor/Nursing/MHT/Case Management/Adjunct)  09/21/2011 2:30 pm   Type of Therapy: Group Therapy   Participation Level: Minimal   Participation Quality:  Sharing, Attentive   Affect: Blunted  Cognitive:  Oriented, Alert  Insight: Poor  Engagement in Group: Limited  Modes of Intervention: Clarification, Education, Problem-solving, Socialization and Support, Activity   Summary of Progress/Problems: Pt participated in group by listening attentively and self disclosing.  Therapist prompted patients to identify one thing they would like to change.  Pt explained she would like to have more energy.  Therapist and group offered suggestions including exercise and eating small meals throughout the day.  Therapist explained the Affirmations Exercise and prompted Pt to give and receive positive affirmations to others.  Pt actively participated. Therapist offered support and encouragement.  Minimal Progress noted.  Intervention effective.         Marni Griffon C 09/21/2011, 2:30 PM

## 2011-09-21 NOTE — Progress Notes (Signed)
Patient ID: [name], female   DOB: [birth year], 41 y.o.   MRN: [id number]   D:Patient anxious on approach this am. Assertive but guarded around others. Paces a lot during the day and doesn't interact much with other peers. Worried about her coumadin and lovenox. Asks frequent questions about it. Reports depression and hopelessness "1" on scale. Denies any A/V hallucinations or SI/HI.  A: Staff will monitor and encourage group attendance R: Taking meds without difficulty and cooperative with staff

## 2011-09-21 NOTE — Progress Notes (Signed)
D: Pt denies SI/HI/AVH. Pt rates her depression as a 2, hopelessness as 0, and anxiety as 3. Pt interaction is assertive but minimal. Pt affect is anxious. Pt states that she did not want to go outside because of the pain that she is experiencing in her neck. Pt remained in room while everyone outside. A: Support and encouragement offered to pt. Advised her that she may feel better if she gets out in the sunlight. Continue Q15 min checks for safety. R: Pt unreceptive to advice. Pt remains safe on unit.

## 2011-09-21 NOTE — Progress Notes (Signed)
[facility] MD Progress Note  09/21/2011 3:15 PM  Diagnosis:  Axis I: Schizophrenia - Paranoid Type.  Posttraumatic Stress Disorder.   The patient was seen today and reports the following:   ADL's: Intact.  Sleep: The patient reports to sleeping well last night.  Appetite: The patient reports a good appetite this morning.   Mild>(1-10) >Severe  Hopelessness (1-10): 0  Depression (1-10): 2  Anxiety (1-10): 4-5   Suicidal Ideation: The patient adamantly denies any suicidal ideations today.  Plan: No  Intent: No  Means: No   Homicidal Ideation: The patient adamantly denies any homicidal ideations today.  Plan: No  Intent: No.  Means: No   General Appearance/Behavior: The patient was friendly and cooperative today with this provider.  Eye Contact: Good.  Speech: Appropriate in rate and volume with no pressuring noted today.  Motor Behavior: wnl.  Level of Consciousness: Alert and Oriented x 3.  Mental Status: Alert and Oriented x 3.  Mood: Appears mildly depressed today.  Affect: Appears mildly constricted.  Anxiety Level: Moderate anxiety reported today.  Thought Process: wnl.  Thought Content: The patient denies any auditory or visual hallucinations today as well as any delusional thinking.  Perception: wnl.  Judgment: Good.  Insight: Good.  Cognition: Oriented to person, place and time.  Sleep:  Number of Hours: 6    Vital Signs:Blood pressure 106/75, pulse 97, temperature 97.6 F (36.4 C), temperature source Oral, resp. rate 16, height 5\' 5"  (1.651 m), weight 100.699 kg (222 lb).  Current Medications: Current Facility-Administered Medications  Medication Dose Route Frequency Provider Last Rate Last Dose  . acetaminophen (TYLENOL) tablet 650 mg  650 mg Oral Q6H PRN Curlene Labrum Lunabelle Oatley, MD      . alum & mag hydroxide-simeth (MAALOX/MYLANTA) 200-200-20 MG/5ML suspension 30 mL  30 mL Oral Q4H PRN Curlene Labrum Jarnell Cordaro, MD      . diazepam (VALIUM) tablet 5 mg  5 mg Oral Q8H PRN Curlene Labrum Laqueisha Catalina, MD   5 mg at 09/21/11 1500  . enoxaparin (LOVENOX) injection 85 mg  85 mg Subcutaneous Q12H Curlene Labrum Keeana Pieratt, MD   85 mg at 09/21/11 0809  . HYDROcodone-acetaminophen (NORCO) 5-325 MG per tablet 1 tablet  1 tablet Oral Q8H PRN Ronny Bacon, MD   1 tablet at 09/21/11 1500  . magnesium hydroxide (MILK OF MAGNESIA) suspension 30 mL  30 mL Oral Daily PRN Curlene Labrum Helmut Hennon, MD      . nicotine (NICODERM CQ - dosed in mg/24 hours) patch 21 mg  21 mg Transdermal Q0600 Curlene Labrum Mildred Tuccillo, MD   21 mg at 09/21/11 0639  . potassium chloride SA (K-DUR,KLOR-CON) CR tablet 20 mEq  20 mEq Oral BH-qamhs Curlene Labrum Stephanie Mcglone, MD   20 mEq at 09/21/11 0742  . risperiDONE (RISPERDAL) tablet 4 mg  4 mg Oral QHS Curlene Labrum Tigran Haynie, MD   4 mg at 09/20/11 2207  . traZODone (DESYREL) tablet 100 mg  100 mg Oral QHS PRN Curlene Labrum Nakeia Calvi, MD      . warfarin (COUMADIN) tablet 4 mg  4 mg Oral ONCE-1800 Curlene Labrum Acheron Sugg, MD   4 mg at 09/20/11 1920  . warfarin (COUMADIN) tablet 6 mg  6 mg Oral Once Malva Cogan, RPH   6 mg at 09/21/11 1206  . Warfarin - Pharmacist Dosing Inpatient   Does not apply [id number] Ronny Bacon, MD       Lab Results:  Results for orders placed during the hospital encounter  of 09/19/11 (from the past 48 hour(s))  PROTIME-INR     Status: Abnormal   Collection Time   09/20/11  6:20 AM      Component Value Range Comment   Prothrombin Time 21.3 (*) 11.6 - 15.2 seconds    INR 1.81 (*) 0.00 - 1.49   PROTIME-INR     Status: Abnormal   Collection Time   09/21/11  6:27 AM      Component Value Range Comment   Prothrombin Time 20.9 (*) 11.6 - 15.2 seconds    INR 1.77 (*) 0.00 - 1.49   CBC     Status: Abnormal   Collection Time   09/21/11  6:27 AM      Component Value Range Comment   WBC 6.5  4.0 - 10.5 K/uL    RBC 4.02  3.87 - 5.11 MIL/uL    Hemoglobin 12.3  12.0 - 15.0 g/dL    HCT 45.4  09.8 - 11.9 %    MCV 93.8  78.0 - 100.0 fL    MCH 30.6  26.0 - 34.0 pg    MCHC 32.6  30.0 - 36.0 g/dL     RDW 14.7 (*) 82.9 - 15.5 %    Platelets 336  150 - 400 K/uL    Review of Systems:  Neurological: The patient denies any headaches today. She denies any seizures or dizziness.  G.I.: The patient denies any constipation or G.I. Upset today.  Musculoskeletal: The patient denies any muscle or skeletal difficulties.   Time was spent today discussing with the patient her current symptoms. The patient states that she slept well last night and reports a good appetite.  The patient reports mild feelings of sadness, anhedonia and depressed mood and denies any suicidal or homicidal ideations. The patient reports moderate anxiety. She denies any auditory or visual hallucinations or delusional thinking today but continues to be preoccupied about issues relating to her health and especially her warfarin.   Treatment Plan Summary:  1. Daily contact with patient to assess and evaluate symptoms and progress in treatment.  2. Medication management  3. The patient will deny suicidal ideations or homicidal ideations for 48 hours prior to discharge and have a depression and anxiety rating of 3 or less. The patient will also deny any auditory or visual hallucinations or delusional thinking.  4. The patient will deny any symptoms of substance withdrawal at time of discharge.   Plan:  1. Will continue the medication Risperdal at 4 mgs po qhs for paranoid delusions.  2. Will continue the medication Warfarin as prescribed. This will be followed by pharmacy.  3. Will continue the medication KCL 20 mEq po q am and hs for hypokalemia.  4. Laboratory studies reviewed.  5. Will continue to monitor.   [name] 09/21/2011, 3:15 PM

## 2011-09-21 NOTE — Discharge Planning (Signed)
Met with patient in Aftercare Planning Group.   Affect is flat, mood depressed and anxious.  Patient talked about needing a blood specialist because she is very worried about her illness, and is tired of injections into the stomach.  She lives with her boyfriend and can return there at D/C.  [place] out[place] community without access to the bus route, so access to healthcare is an issue.  Patient can be referred to [facility] for follow-up, but should also be referred to an agency for a [employer] assessment.  Patient's Primary Care Physician is Para March at [facility].  Utilization will be done today for continuing stay.  Ambrose Mantle, LCSW 09/21/2011, 1:30 PM

## 2011-09-21 NOTE — Progress Notes (Signed)
ANTICOAGULATION CONSULT NOTE - Follow Up Consult  Pharmacy Consult for Coumadin  Indication: History of PE  Allergies  Allergen Reactions  . Darvocet (Propoxyphene-Acetaminophen)     Patient Measurements: Height: 5\' 5"  (165.1 cm) Weight: 222 lb (100.699 kg) IBW/kg (Calculated) : 57  Heparin Dosing Weight: 85 Kg  Vital Signs: Temp: 97.6 F (36.4 C) (06/21 0713) BP: 106/75 mmHg (06/21 0713) Pulse Rate: 97  (06/21 0713)  Labs:  Alvira Philips 09/21/11 0627 09/20/11 0620 09/19/11 0810  HGB 12.3 -- --  HCT 37.7 -- --  PLT 336 -- --  APTT -- -- --  LABPROT 20.9* 21.3* 18.1*  INR 1.77* 1.81* 1.47  HEPARINUNFRC -- -- --  CREATININE -- -- 1.01  CKTOTAL -- -- --  CKMB -- -- --  TROPONINI -- -- --    Estimated Creatinine Clearance: 85.3 ml/min (by C-G formula based on Cr of 1.01).   Medications:  Scheduled:    . enoxaparin (LOVENOX) injection  85 mg Subcutaneous Q12H  . nicotine  21 mg Transdermal Q0600  . potassium chloride  20 mEq Oral BH-qamhs  . risperiDONE  4 mg Oral QHS  . warfarin  4 mg Oral ONCE-1800  . warfarin  6 mg Oral Once  . Warfarin - Pharmacist Dosing Inpatient   Does not apply q1800    Assessment: INR dropped to 1.77  Platelets have dropped but level is still elevated  Goal of Therapy:  INR 2-3    Plan:  Will give Coumadin 6 mg today at 12 noon and hopefully INR will be above 2 tomorrow morning and can stop Lovenox. Will also recheck CBC to check on platelets.  Malva Cogan 09/21/2011,8:34 AM

## 2011-09-22 LAB — PROTIME-INR
INR: 1.94 — ABNORMAL HIGH (ref 0.00–1.49)
Prothrombin Time: 22.5 seconds — ABNORMAL HIGH (ref 11.6–15.2)

## 2011-09-22 LAB — CBC
MCH: 31.3 pg (ref 26.0–34.0)
Platelets: 350 10*3/uL (ref 150–400)
RBC: 3.84 MIL/uL — ABNORMAL LOW (ref 3.87–5.11)
WBC: 7 10*3/uL (ref 4.0–10.5)

## 2011-09-22 MED ORDER — WARFARIN SODIUM 6 MG PO TABS
6.0000 mg | ORAL_TABLET | Freq: Once | ORAL | Status: AC
  Administered 2011-09-22: 6 mg via ORAL
  Filled 2011-09-22: qty 1

## 2011-09-22 NOTE — Progress Notes (Signed)
Patient ID: [name], female   DOB: 27-Apr-1970, 42 y.o.   MRN: [id number]   [facility] Group Notes:  (Counselor/Nursing/MHT/Case Management/Adjunct)  09/22/2011 11 AM  Type of Therapy:  Aftercare Planning, Group Therapy, Dance/Movement Therapy   Participation Level:  Active  Participation Quality:  Appropriate  Affect:  Appropriate  Cognitive:  Appropriate  Insight:  Good  Engagement in Group:  Good  Engagement in Therapy:  Good  Modes of Intervention:  Clarification, Problem-solving, Role-play, Socialization and Support  Summary of Progress/Problems: After Care: Pt. attended and participated in aftercare planning group. Pt. accepted information on suicide prevention, warning signs to look for with suicide and crisis line numbers to use. Therapist invited patients to share their current mood based on a weather forecast. Pt. Shared that she was feeling good like a breeze.  Counseling: Therapist began group by inviting patients to share good things we can do for our bodies including proper hygiene and regular exercise. Therapist and patients discussed what we can do when we get stuck and some of the bad things we do when that happens. Therapist ended group by helping patients come up with at least 5 positive coping skills. Pt. was very active during group and shared several positive coping skills.     [name]

## 2011-09-22 NOTE — Progress Notes (Signed)
  [name] is a 41 y.o. female [id number] [birth year]  09/19/2011 Principal Problem:  *Schizophrenia, paranoid Active Problems:  Post traumatic stress disorder (PTSD)   Mental Status: Mood says she is very relieved that her diagnosis is appropriate now-PTSD. Denies SI/HI/AVh but does seem to to be in her own head when walking in the [place].     Subjective/Objective: Good eye contact initiated appropriate conversation could speak appropriately about her INR. Says she had counseling for her PTSD years ago and isn't sure why it has retrigered.     Filed Vitals:   09/22/11 0832  BP: 113/80  Pulse: 106  Temp:   Resp:     Lab Results:   BMET    Component Value Date/Time   NA 139 09/15/2011 1931   K 3.3* 09/15/2011 1931   CL 102 09/15/2011 1931   CO2 26 09/15/2011 1931   GLUCOSE 109* 09/15/2011 1931   BUN 10 09/15/2011 1931   CREATININE 1.01 09/19/2011 0810   CREATININE 0.93 12/25/2010 1157   CALCIUM 9.2 09/15/2011 1931   GFRNONAA 68* 09/19/2011 0810   GFRAA 78* 09/19/2011 0810    Medications:  Scheduled:     . enoxaparin (LOVENOX) injection  85 mg Subcutaneous Q12H  . nicotine  21 mg Transdermal Q0600  . potassium chloride  20 mEq Oral BH-qamhs  . risperiDONE  4 mg Oral QHS  . warfarin  6 mg Oral Once  . Warfarin - Pharmacist Dosing Inpatient   Does not apply q1800     PRN Meds acetaminophen, alum & mag hydroxide-simeth, diazepam, HYDROcodone-acetaminophen, magnesium hydroxide, traZODone  Plan: continue plan of care. Pharmacy is managing Coumadin.  Sherea Liptak,MICKIE D. 09/22/2011

## 2011-09-22 NOTE — Progress Notes (Signed)
ANTICOAGULATION CONSULT NOTE - Follow Up Consult  Pharmacy Consult for Coumadin Indication: VTE prophylaxis  Allergies  Allergen Reactions  . Darvocet (Propoxyphene-Acetaminophen)     Patient Measurements: Height: 5\' 5"  (165.1 cm) Weight: 222 lb (100.699 kg) IBW/kg (Calculated) : 57  Heparin Dosing Weight: 85 Kg  Vital Signs:    Labs:  Basename 09/22/11 0630 09/21/11 0627 09/20/11 0620  HGB 12.0 12.3 --  HCT 36.8 37.7 --  PLT 350 336 --  APTT -- -- --  LABPROT 22.5* 20.9* 21.3*  INR 1.94* 1.77* 1.81*  HEPARINUNFRC -- -- --  CREATININE -- -- --  CKTOTAL -- -- --  CKMB -- -- --  TROPONINI -- -- --    Estimated Creatinine Clearance: 85.3 ml/min (by C-G formula based on Cr of 1.01).   Medications:  Scheduled:    . enoxaparin (LOVENOX) injection  85 mg Subcutaneous Q12H  . nicotine  21 mg Transdermal Q0600  . potassium chloride  20 mEq Oral BH-qamhs  . risperiDONE  4 mg Oral QHS  . warfarin  6 mg Oral Once  . warfarin  6 mg Oral Once  . Warfarin - Pharmacist Dosing Inpatient   Does not apply q1800    Assessment: IRN is now at 1.94 after 6 mg dose of Coumadin yesterday.  Platelets are normal.  Goal of Therapy:  INR 2-3    Plan:  Will give Coumadin 6 mg again early today at noon and recheck INR in AM.  Hopefully it will be above 2 and we can D/C the Lovenox.  Pamala Duffel L 09/22/2011,8:25 AM

## 2011-09-22 NOTE — Progress Notes (Signed)
Pt is sad and depressed   She requests pain medications and antianxiety medications as often as she can get it   Pt attends and participates in group  She has minimal interaction with others   She denies suicidal and homicidal ideation   And denies auditory and visual hallucinations    She isolates in her room frequently   Verbal support given   Medications administered and effectiveness  monitored   Q 15 min checks  Pt safe at present

## 2011-09-22 NOTE — Progress Notes (Signed)
Pt remained up pacing the halls until 2300, at which time she could have Norco and Valium. Pt asked several times to receive the medications early, however I informed her that I was not able to do so. Pt medicated. As of 0000, pt pain and anxiety had decreased.

## 2011-09-23 LAB — PROTIME-INR
INR: 1.86 — ABNORMAL HIGH (ref 0.00–1.49)
Prothrombin Time: 21.8 seconds — ABNORMAL HIGH (ref 11.6–15.2)

## 2011-09-23 MED ORDER — WARFARIN SODIUM 7.5 MG PO TABS
7.5000 mg | ORAL_TABLET | Freq: Once | ORAL | Status: AC
  Administered 2011-09-23: 7.5 mg via ORAL
  Filled 2011-09-23: qty 1

## 2011-09-23 NOTE — Progress Notes (Signed)
Patient ID: [name], female   DOB: 1970-08-15, 41 y.o.   MRN: [id number] Has been pleasant, cooperative, rather quiet and unintrusive. Moves slowly and seems anxious about being here, soft spoken and cautious, a little nervous about some of her peers.  Taking her meds without issue, and requested her norco and valium a little early so that she wouldn't have to wait until after midnight. Denies SI/HI, went back to her room.  Will continue to monitor.

## 2011-09-23 NOTE — Progress Notes (Signed)
[facility] Group Notes:  (Counselor/Nursing/MHT/Case Management/Adjunct)  09/23/2011 4:03 PM  Type of Therapy:  Group Therapy and discharge planning   Participation Level:  Active  Participation Quality:  Appropriate and Attentive  Affect:  Appropriate  Cognitive:  Appropriate  Insight:  Good  Engagement in Group:  Good  Engagement in Therapy:  Good  Modes of Intervention:  Problem-solving, Support and exploration  Summary of Progress/Problems:Pt attended the combination discharge planning session and group therapy. During discharge portion Pt was given handouts for suicide prevention information and list of group supports. During group pt was able to participate in the practicing mindfulness activity where Pt's used their senses via touch, smell and taste to live in the moment to on focus on the here and now and healthy growth. Pt shared that the flowers reminded her of spending time with her grandmother. Pt stated she feels that this exercise will allow her to live more in the moment. [name], LPCA     [name] 09/23/2011, 4:03 PM

## 2011-09-23 NOTE — Progress Notes (Signed)
Pt is depressed and sad   She attends and participated in group   She is anxious  She denise suicidal and homicidal ideation   Denies auditory and visual hallucinations   Verbal support given  Medications administered and effectiveness monitored  Pt safe

## 2011-09-23 NOTE — Progress Notes (Signed)
ANTICOAGULATION CONSULT NOTE - Follow Up Consult  Pharmacy Consult for Coumadin Indication: VTE prophylaxis  Allergies  Allergen Reactions  . Darvocet (Propoxyphene-Acetaminophen)     Patient Measurements: Height: 5\' 5"  (165.1 cm) Weight: 222 lb (100.699 kg) IBW/kg (Calculated) : 57  Heparin Dosing Weight: 85 Kg  Vital Signs:    Labs:  Basename 09/23/11 0648 09/22/11 0630 09/21/11 0627  HGB -- 12.0 12.3  HCT -- 36.8 37.7  PLT -- 350 336  APTT -- -- --  LABPROT 21.8* 22.5* 20.9*  INR 1.86* 1.94* 1.77*  HEPARINUNFRC -- -- --  CREATININE -- -- --  CKTOTAL -- -- --  CKMB -- -- --  TROPONINI -- -- --    Estimated Creatinine Clearance: 85.3 ml/min (by C-G formula based on Cr of 1.01).   Medications:  Scheduled:    . enoxaparin (LOVENOX) injection  85 mg Subcutaneous Q12H  . nicotine  21 mg Transdermal Q0600  . risperiDONE  4 mg Oral QHS  . warfarin  6 mg Oral Once  . warfarin  7.5 mg Oral Once  . Warfarin - Pharmacist Dosing Inpatient   Does not apply q1800    Assessment: INR dropped to 1.86 after a dose of 6 mg.  Home dose was 5 mg daily except Tues and Thur when the dose was 2.5 mg  Goal of Therapy:  INR 2-3   Platelets are being monitored and are in normal range.  There was a small increase yesterday and we will recheck them tomorrow.   Plan:  Will give Coumadin 7.5 mg today at noon and recheck INR and CBC in AM.  If INR is above 2 in the AM we will discontinue the Lovenox and continue the Coumadin  Malva Cogan 09/23/2011,8:24 AM

## 2011-09-23 NOTE — Progress Notes (Signed)
  [name] is a 41 y.o. female [id number] [birth year]  09/19/2011 Principal Problem:  *Schizophrenia, paranoid Active Problems:  Post traumatic stress disorder (PTSD)   Mental Status: Alert mood is good smiles easily seems relaxed today. Denies SI/HI/AVH.    Subjective/Objective: Has a large bruise on her abdomen from the Lovenox . Was woken by roommate last night who had the heat too high.Does not appear hypervigilant does not report flashbacks. Does exhibit some avoidance .     Filed Vitals:   09/23/11 0841  BP: 116/85  Pulse: 114  Temp:   Resp:     Lab Results:   BMET    Component Value Date/Time   NA 139 09/15/2011 1931   K 3.3* 09/15/2011 1931   CL 102 09/15/2011 1931   CO2 26 09/15/2011 1931   GLUCOSE 109* 09/15/2011 1931   BUN 10 09/15/2011 1931   CREATININE 1.01 09/19/2011 0810   CREATININE 0.93 12/25/2010 1157   CALCIUM 9.2 09/15/2011 1931   GFRNONAA 68* 09/19/2011 0810   GFRAA 78* 09/19/2011 0810    Medications:  Scheduled:     . enoxaparin (LOVENOX) injection  85 mg Subcutaneous Q12H  . nicotine  21 mg Transdermal Q0600  . risperiDONE  4 mg Oral QHS  . warfarin  7.5 mg Oral Once  . Warfarin - Pharmacist Dosing Inpatient   Does not apply q1800     PRN Meds acetaminophen, alum & mag hydroxide-simeth, diazepam, HYDROcodone-acetaminophen, magnesium hydroxide, traZODone  Plan: continue current plan of care.  Leshawn Straka,MICKIE D. 09/23/2011

## 2011-09-24 LAB — DIFFERENTIAL
Lymphs Abs: 3 10*3/uL (ref 0.7–4.0)
Monocytes Relative: 10 % (ref 3–12)
Neutro Abs: 3.7 10*3/uL (ref 1.7–7.7)
Neutrophils Relative %: 47 % (ref 43–77)

## 2011-09-24 LAB — COMPREHENSIVE METABOLIC PANEL
AST: 29 U/L (ref 0–37)
CO2: 25 mEq/L (ref 19–32)
Calcium: 9.6 mg/dL (ref 8.4–10.5)
Creatinine, Ser: 0.93 mg/dL (ref 0.50–1.10)
GFR calc Af Amer: 87 mL/min — ABNORMAL LOW (ref >90)
GFR calc non Af Amer: 75 mL/min — ABNORMAL LOW (ref >90)
Total Protein: 7.3 g/dL (ref 6.0–8.3)

## 2011-09-24 LAB — CBC
HCT: 38.5 % (ref 36.0–46.0)
Hemoglobin: 12.4 g/dL (ref 12.0–15.0)
MCHC: 31.9 g/dL (ref 30.0–36.0)
Platelets: 337 10*3/uL (ref 150–400)
RBC: 4.07 MIL/uL (ref 3.87–5.11)
RDW: 15.9 % — ABNORMAL HIGH (ref 11.5–15.5)

## 2011-09-24 LAB — PROTIME-INR: INR: 1.85 — ABNORMAL HIGH (ref 0.00–1.49)

## 2011-09-24 MED ORDER — WARFARIN SODIUM 10 MG PO TABS
10.0000 mg | ORAL_TABLET | Freq: Once | ORAL | Status: AC
  Administered 2011-09-24: 10 mg via ORAL
  Filled 2011-09-24: qty 1

## 2011-09-24 MED ORDER — RISPERIDONE 2 MG PO TABS
2.0000 mg | ORAL_TABLET | Freq: Four times a day (QID) | ORAL | Status: DC | PRN
  Administered 2011-09-24 – 2011-09-25 (×2): 2 mg via ORAL
  Filled 2011-09-24: qty 1

## 2011-09-24 MED ORDER — WARFARIN SODIUM 7.5 MG PO TABS
7.5000 mg | ORAL_TABLET | Freq: Once | ORAL | Status: DC
  Filled 2011-09-24: qty 1

## 2011-09-24 NOTE — Progress Notes (Signed)
[facility] MD Progress Note  09/24/2011 3:18 PM  Diagnosis:  Axis I: Schizophrenia - Paranoid Type.  Posttraumatic Stress Disorder.   The patient was seen today and reports the following:   ADL's: Intact.  Sleep: The patient reports to sleeping well last night without difficulty.  Appetite: The patient reports a good appetite this morning.   Mild>(1-10) >Severe  Hopelessness (1-10): 2  Depression (1-10): 2-3  Anxiety (1-10): 3   Suicidal Ideation: The patient adamantly denies any suicidal ideations today.  Plan: No  Intent: No  Means: No   Homicidal Ideation: The patient adamantly denies any homicidal ideations today.  Plan: No  Intent: No.  Means: No   General Appearance/Behavior: The patient remained friendly and cooperative today with this provider.  Eye Contact: Good.  Speech: Appropriate in rate and volume with no pressuring noted today.  Motor Behavior: wnl.  Level of Consciousness: Alert and Oriented x 3.  Mental Status: Alert and Oriented x 3.  Mood: Appears mildly depressed today.  Affect: Appears mildly constricted.  Anxiety Level: Mild anxiety reported today.  Thought Process: wnl.  Thought Content: The patient denies any auditory or visual hallucinations today as well as any delusional thinking.  Perception: wnl.  Judgment: Good.  Insight: Good.  Cognition: Oriented to person, place and time.  Sleep:  Number of Hours: 5.5    Vital Signs:Blood pressure 114/83, pulse 120, temperature 97.9 F (36.6 C), temperature source Oral, resp. rate 20, height 5\' 5"  (1.651 m), weight 100.699 kg (222 lb).  Current Medications: Current Facility-Administered Medications  Medication Dose Route Frequency Provider Last Rate Last Dose  . acetaminophen (TYLENOL) tablet 650 mg  650 mg Oral Q6H PRN Curlene Labrum Jarin Cornfield, MD      . alum & mag hydroxide-simeth (MAALOX/MYLANTA) 200-200-20 MG/5ML suspension 30 mL  30 mL Oral Q4H PRN Curlene Labrum Delta Pichon, MD      . diazepam (VALIUM) tablet 5 mg  5  mg Oral Q8H PRN Curlene Labrum Treyce Spillers, MD   5 mg at 09/24/11 0802  . enoxaparin (LOVENOX) injection 85 mg  85 mg Subcutaneous Q12H Curlene Labrum Mirtha Jain, MD   85 mg at 09/24/11 0837  . HYDROcodone-acetaminophen (NORCO) 5-325 MG per tablet 1 tablet  1 tablet Oral Q8H PRN Ronny Bacon, MD   1 tablet at 09/24/11 0802  . magnesium hydroxide (MILK OF MAGNESIA) suspension 30 mL  30 mL Oral Daily PRN Curlene Labrum Melenie Minniear, MD      . nicotine (NICODERM CQ - dosed in mg/24 hours) patch 21 mg  21 mg Transdermal Q0600 Curlene Labrum Erez Mccallum, MD   21 mg at 09/24/11 0645  . risperiDONE (RISPERDAL) tablet 2 mg  2 mg Oral Q6H PRN Curlene Labrum Ruwayda Curet, MD   2 mg at 09/24/11 1428  . risperiDONE (RISPERDAL) tablet 4 mg  4 mg Oral QHS Curlene Labrum Jaymarion Trombly, MD   4 mg at 09/23/11 2149  . traZODone (DESYREL) tablet 100 mg  100 mg Oral QHS PRN Curlene Labrum Lavere Shinsky, MD      . warfarin (COUMADIN) tablet 10 mg  10 mg Oral ONCE-1800 Ronny Bacon, MD      . Warfarin - Pharmacist Dosing Inpatient   Does not apply [id number] Curlene Labrum Noely Kuhnle, MD      . DISCONTD: warfarin (COUMADIN) tablet 7.5 mg  7.5 mg Oral ONCE-1800 Ronny Bacon, MD       Lab Results:  Results for orders placed during the hospital encounter of 09/19/11 (from the past  48 hour(s))  PROTIME-INR     Status: Abnormal   Collection Time   09/23/11  6:48 AM      Component Value Range Comment   Prothrombin Time 21.8 (*) 11.6 - 15.2 seconds    INR 1.86 (*) 0.00 - 1.49   CBC     Status: Abnormal   Collection Time   09/24/11  6:10 AM      Component Value Range Comment   WBC 6.7  4.0 - 10.5 K/uL    RBC 4.09  3.87 - 5.11 MIL/uL    Hemoglobin 12.3  12.0 - 15.0 g/dL    HCT 16.1  09.6 - 04.5 %    MCV 94.1  78.0 - 100.0 fL    MCH 30.1  26.0 - 34.0 pg    MCHC 31.9  30.0 - 36.0 g/dL    RDW 40.9 (*) 81.1 - 15.5 %    Platelets 337  150 - 400 K/uL   PROTIME-INR     Status: Abnormal   Collection Time   09/24/11  6:10 AM      Component Value Range Comment   Prothrombin Time 21.7 (*) 11.6 -  15.2 seconds    INR 1.85 (*) 0.00 - 1.49    Review of Systems:  Neurological: The patient denies any headaches today. She denies any seizures or dizziness.  G.I.: The patient denies any constipation or G.I. Upset today.  Musculoskeletal: The patient denies any muscle or skeletal difficulties.   Time was spent today discussing with the patient her current symptoms. The patient states that she slept well last night and reports a good appetite. The patient reports mild feelings of sadness, anhedonia and depressed mood and denies any suicidal or homicidal ideations. The patient also reports mild anxiety. She denies any auditory or visual hallucinations or delusional thinking today but continues to be concerned about her blood levels and Coumadin dosage.   Treatment Plan Summary:  1. Daily contact with patient to assess and evaluate symptoms and progress in treatment.  2. Medication management  3. The patient will deny suicidal ideations or homicidal ideations for 48 hours prior to discharge and have a depression and anxiety rating of 3 or less. The patient will also deny any auditory or visual hallucinations or delusional thinking.  4. The patient will deny any symptoms of substance withdrawal at time of discharge.   Plan:  1. Will continue the medication Risperdal at 4 mgs po qhs for paranoid delusions.  2. Will continue the medication Warfarin as prescribed. This will be followed by pharmacy.  3. Will continue to monitor.  4. Discharge once the patient's INR is stable. 5. Will order Risperdal 2 mgs po q 6 hours - prn for agitation.  [name] 09/24/2011, 3:18 PM

## 2011-09-24 NOTE — Progress Notes (Signed)
Patient ID: [name], female   DOB: [birth year], 41 y.o.   MRN: [id number] D- Pt reports sleeping well and says her appetite is good.  She rates her depression a 2 .She denies SI/HI.  She continues to have pain in her neck. She is concerned about her heparin and coumadin and her lab levels.  She has talked with MD about this.  A MD and Pharmicist are  monitoring levels.  Pt is attending groups and participating.

## 2011-09-24 NOTE — Progress Notes (Signed)
D. Pt.. Has a flat affect, anxious mood.  A. Requested and was given PRN anxiety medication.  R.  Will Continue to assess pt.  Support given.

## 2011-09-24 NOTE — Tx Team (Signed)
Interdisciplinary Treatment Plan Update (Adult)  Date:  09/24/2011  Time Reviewed:  10:15AM-11:15AM  Progress in Treatment: Attending groups:  Yes Participating in groups:  Yes   Taking medication as prescribed:    Yes Tolerating medication:   Yes Family/Significant other contact made:  Yes Patient understands diagnosis:   Yes Discussing patient identified problems/goals with staff:   Yes Medical problems stabilized or resolved:   No, blood levels for Coumadin still not at appropriate level (needs to be 2-3, is only at 1.85 today) Denies suicidal/homicidal ideation:  Yes Issues/concerns per patient self-inventory:   Yes, bruising on stomach Other:    New problem(s) identified: No, Describe:    Reason for Continuation of Hospitalization: Anxiety Medical Issues Medication stabilization  Interventions implemented related to continuation of hospitalization:  Medication monitoring and adjustment, safety checks Q15 min., suicide risk assessment, group therapy, psychoeducation, collateral contact, aftercare planning, ongoing physician assessments, medication education  Additional comments:  Not applicable  Estimated length of stay:  1 day  Discharge Plan:  Return home to live with boyfriend, follow up with [facility] for medication management and referral to [facility] CST.  New goal(s):  Not applicable  Review of initial/current patient goals per problem list:   1.  Goal(s):  Eliminate paranoia, reduce psychosis to a manageable level  Met:  Yes  Target date:  By Discharge   As evidenced by:  No paranoia, psychosis appears to be reamining  2.  Goal(s):  Identify outpt provider  Met:  Yes  Target date:  By Discharge   As evidenced by:  Will go to [place] for med mgmt, CST referral has been made to [facility]  3.  Goal(s):  Contact boyfriend for collateral info  Met:  Yes  Target date:  By Discharge   As evidenced by:  BF was contacted for collateral  4.  Goal(s):  Medication  Management  Met:  No  Target date:  By Discharge   As evidenced by:  Ongoing, particularly for medical needs  Attendees: Patient:  [name]  09/24/2011 10:15AM-11:15AM  Family:     Physician:  Dr. Harvie Heck Readling 09/24/2011 10:15AM-11:15AM  Nursing:   Roswell Miners, RN 09/24/2011 10:15AM -11:15AM   Case Manager:  Ambrose Mantle, LCSW 09/24/2011 10:15AM-11:15AM  Counselor:  Veto Kemps, MT-BC 09/24/2011 10:15AM-11:15AM  Other:      Other:      Other:      Other:       Scribe for Treatment Team:   Sarina Ser, 09/24/2011, 10:15AM-11:15AM

## 2011-09-24 NOTE — Discharge Planning (Signed)
Met with patient in Aftercare Planning Group.   She was quiet and seemed anxious throughout group.  No case management needs expressed today, just concern over getting her Coumadin level to the right place so that she can be discharged home.   Case Manager made appointment for her to follow up with [facility] on 09/27/11 at 3pm.  Also did referral to [facility] for [employer].  Did utilization review for an additional day.  Ambrose Mantle, LCSW 09/24/2011, 2:11 PM

## 2011-09-24 NOTE — Progress Notes (Addendum)
ANTICOAGULATION CONSULT NOTE - Follow Up Consult  Pharmacy Consult for Coumadin Indication: VTE prophylaxis  Allergies  Allergen Reactions  . Darvocet (Propoxyphene-Acetaminophen)     Patient Measurements: Height: 5\' 5"  (165.1 cm) Weight: 222 lb (100.699 kg) IBW/kg (Calculated) : 57    Vital Signs: Temp: 97.9 F (36.6 C) (06/24 0758) Temp src: Oral (06/24 0758) BP: 114/83 mmHg (06/24 0759) Pulse Rate: 120  (06/24 0759)  Labs:  Alvira Philips 09/24/11 0610 09/23/11 0648 09/22/11 0630  HGB 12.3 -- 12.0  HCT 38.5 -- 36.8  PLT 337 -- 350  APTT -- -- --  LABPROT 21.7* 21.8* 22.5*  INR 1.85* 1.86* 1.94*  HEPARINUNFRC -- -- --  CREATININE -- -- --  CKTOTAL -- -- --  CKMB -- -- --  TROPONINI -- -- --    Estimated Creatinine Clearance: 85.3 ml/min (by C-G formula based on Cr of 1.01).   Medications:  Scheduled:    . enoxaparin (LOVENOX) injection  85 mg Subcutaneous Q12H  . nicotine  21 mg Transdermal Q0600  . risperiDONE  4 mg Oral QHS  . warfarin  7.5 mg Oral Once  . warfarin  7.5 mg Oral ONCE-1800  . Warfarin - Pharmacist Dosing Inpatient   Does not apply q1800    Assessment: INR still below goal at 1.85 today.  No problems with therapy noted.  Patient frustrated with continued lovenox as she is experiencing bruising at injection sites.    Goal of Therapy:  INR 2-3    Plan:  Continue lovenox until INR >2.0.   Coumadin 10 mg x 1 today at 1800. PT/INR am labs 09/25/11 Will f/u in am 09/25/11  [name] 09/24/2011,8:18 AM

## 2011-09-24 NOTE — Progress Notes (Signed)
Patient ID: [name], female   DOB: [birth year], 41 y.o.   MRN: [id number] Has been pleasant, quiet, cooperative this evening, was nearly pain-free at beginning of the shift, attended group.  Lovenox was given this evening after speaking with pharmacist to verify it should be, as pt had understood that she would only have earlier dose in the day.  Pharmacist verified that INR levels are still sub-therapeutic and to give it tonight. Pt was agreeable but a little disappointed as she has some bruising noted on abdomen. Staying awake for next dose of valium and norco.  Denies thoughts of self harm, feeling better.  Will continue to monitor.

## 2011-09-25 LAB — PROTIME-INR: INR: 1.83 — ABNORMAL HIGH (ref 0.00–1.49)

## 2011-09-25 MED ORDER — HYDROCODONE-ACETAMINOPHEN 5-325 MG PO TABS
1.0000 | ORAL_TABLET | ORAL | Status: DC
  Administered 2011-09-25 – 2011-09-26 (×3): 1 via ORAL
  Filled 2011-09-25 (×3): qty 1
  Filled 2011-09-25: qty 15

## 2011-09-25 MED ORDER — MAGNESIUM CITRATE PO SOLN
1.0000 | ORAL | Status: AC
  Administered 2011-09-25: 1 via ORAL

## 2011-09-25 MED ORDER — HYDROCODONE-ACETAMINOPHEN 5-325 MG PO TABS
1.0000 | ORAL_TABLET | ORAL | Status: AC
  Administered 2011-09-25: 1 via ORAL
  Filled 2011-09-25: qty 1

## 2011-09-25 MED ORDER — DIAZEPAM 5 MG PO TABS
5.0000 mg | ORAL_TABLET | ORAL | Status: AC
  Administered 2011-09-25: 5 mg via ORAL
  Filled 2011-09-25: qty 1

## 2011-09-25 MED ORDER — WARFARIN SODIUM 10 MG PO TABS
10.0000 mg | ORAL_TABLET | Freq: Once | ORAL | Status: AC
  Administered 2011-09-25: 10 mg via ORAL
  Filled 2011-09-25: qty 1

## 2011-09-25 MED ORDER — DIAZEPAM 5 MG PO TABS
5.0000 mg | ORAL_TABLET | ORAL | Status: DC
  Administered 2011-09-25 – 2011-09-26 (×3): 5 mg via ORAL
  Filled 2011-09-25: qty 1
  Filled 2011-09-25: qty 15
  Filled 2011-09-25 (×2): qty 1

## 2011-09-25 NOTE — Discharge Planning (Signed)
Met with patient in Aftercare Planning Group.   Did utilization review for additional day because Coumadin level still not at therapeutic level.  Arranged for [facility] to come to hospital tomorrow to do Comprehensive Clinical Assessment for consideration for [employer].  Ambrose Mantle, LCSW 09/25/2011, 5:15 PM

## 2011-09-25 NOTE — Progress Notes (Signed)
[facility] Group Notes:  (Counselor/Nursing/MHT/Case Management/Adjunct)  09/25/2011 2:40 PM  Type of Therapy:  Group Therapy  09/24/11  Participation Level:  Active  Participation Quality:  Attentive and Sharing  Affect:  Blunted  Cognitive:  Oriented  Insight:  Good  Engagement in Group:  Good  Engagement in Therapy:  Good  Modes of Intervention:  Education and Support  Summary of Progress/Problems: Patient talked about feeling better but needed to stay until her coumadin level was therapeutic. Less anxious.   Bela Bonaparte, Aram Beecham 09/25/2011, 2:40 PM

## 2011-09-25 NOTE — Progress Notes (Signed)
[facility] MD Progress Note  09/25/2011 5:15 PM  Diagnosis:  Axis I: Schizophrenia - Paranoid Type.  Posttraumatic Stress Disorder.   The patient was seen today and reports the following:   ADL's: Intact.  Sleep: The patient reports to sleeping well last night without difficulty.  Appetite: The patient reports a good appetite this morning.   Mild>(1-10) >Severe  Hopelessness (1-10): 0  Depression (1-10): 2  Anxiety (1-10): 2   Suicidal Ideation: The patient adamantly denies any suicidal ideations today.  Plan: No  Intent: No  Means: No   Homicidal Ideation: The patient adamantly denies any homicidal ideations today.  Plan: No  Intent: No.  Means: No   General Appearance/Behavior: The patient remained friendly and cooperative today with this provider.  Eye Contact: Good.  Speech: Appropriate in rate and volume with no pressuring noted today.  Motor Behavior: wnl.  Level of Consciousness: Alert and Oriented x 3.  Mental Status: Alert and Oriented x 3.  Mood: Appears mildly depressed today.  Affect: Appears mildly constricted.  Anxiety Level: Mild anxiety reported today.  Thought Process: wnl.  Thought Content: The patient denies any auditory or visual hallucinations today as well as any delusional thinking.  Perception: wnl.  Judgment: Good.  Insight: Good.  Cognition: Oriented to person, place and time.  Sleep:  Number of Hours: 5.75    Vital Signs:Blood pressure 115/80, pulse 98, temperature 97.1 F (36.2 C), temperature source Oral, resp. rate 18, height 5\' 5"  (1.651 m), weight 100.699 kg (222 lb).  Current Medications: Current Facility-Administered Medications  Medication Dose Route Frequency Provider Last Rate Last Dose  . acetaminophen (TYLENOL) tablet 650 mg  650 mg Oral Q6H PRN Curlene Labrum Dayonna Selbe, MD      . alum & mag hydroxide-simeth (MAALOX/MYLANTA) 200-200-20 MG/5ML suspension 30 mL  30 mL Oral Q4H PRN Curlene Labrum Cristopher Ciccarelli, MD      . diazepam (VALIUM) tablet 5 mg  5  mg Oral [id number] Gracee Ratterree D Kayton Ripp, MD      . enoxaparin (LOVENOX) injection 85 mg  85 mg Subcutaneous Q12H Curlene Labrum Alaine Loughney, MD   85 mg at 09/25/11 0804  . HYDROcodone-acetaminophen (NORCO) 5-325 MG per tablet 1 tablet  1 tablet Oral [id number] Miryah Ralls D Talyssa Gibas, MD      . magnesium citrate solution 1 Bottle  1 Bottle Oral NOW Ronny Bacon, MD   1 Bottle at 09/25/11 0834  . magnesium hydroxide (MILK OF MAGNESIA) suspension 30 mL  30 mL Oral Daily PRN Curlene Labrum Bianco Cange, MD   30 mL at 09/24/11 2127  . nicotine (NICODERM CQ - dosed in mg/24 hours) patch 21 mg  21 mg Transdermal Q0600 Curlene Labrum Tzipora Mcinroy, MD   21 mg at 09/25/11 0549  . risperiDONE (RISPERDAL) tablet 2 mg  2 mg Oral Q6H PRN Ronny Bacon, MD   2 mg at 09/25/11 0803  . risperiDONE (RISPERDAL) tablet 4 mg  4 mg Oral QHS Curlene Labrum Sherre Wooton, MD   4 mg at 09/24/11 2127  . traZODone (DESYREL) tablet 100 mg  100 mg Oral QHS PRN Curlene Labrum Jorryn Hershberger, MD      . warfarin (COUMADIN) tablet 10 mg  10 mg Oral ONCE-1800 Curlene Labrum Lilyanah Celestin, MD   10 mg at 09/24/11 1813  . warfarin (COUMADIN) tablet 10 mg  10 mg Oral ONCE-1800 Ronny Bacon, MD      . Warfarin - Pharmacist Dosing Inpatient   Does not apply [id number] Ronny Bacon, MD      .  DISCONTD: diazepam (VALIUM) tablet 5 mg  5 mg Oral Q8H PRN Curlene Labrum Takari Duncombe, MD   5 mg at 09/25/11 0943  . DISCONTD: HYDROcodone-acetaminophen (NORCO) 5-325 MG per tablet 1 tablet  1 tablet Oral Q8H PRN Ronny Bacon, MD   1 tablet at 09/25/11 [phone]   Lab Results:  Results for orders placed during the hospital encounter of 09/19/11 (from the past 48 hour(s))  CBC     Status: Abnormal   Collection Time   09/24/11  6:10 AM      Component Value Range Comment   WBC 6.7  4.0 - 10.5 K/uL    RBC 4.09  3.87 - 5.11 MIL/uL    Hemoglobin 12.3  12.0 - 15.0 g/dL    HCT 96.0  45.4 - 09.8 %    MCV 94.1  78.0 - 100.0 fL    MCH 30.1  26.0 - 34.0 pg    MCHC 31.9  30.0 - 36.0 g/dL    RDW 11.9 (*) 14.7 - 15.5 %    Platelets  337  150 - 400 K/uL   PROTIME-INR     Status: Abnormal   Collection Time   09/24/11  6:10 AM      Component Value Range Comment   Prothrombin Time 21.7 (*) 11.6 - 15.2 seconds    INR 1.85 (*) 0.00 - 1.49   COMPREHENSIVE METABOLIC PANEL     Status: Abnormal   Collection Time   09/24/11  7:15 PM      Component Value Range Comment   Sodium 135  135 - 145 mEq/L    Potassium 4.0  3.5 - 5.1 mEq/L    Chloride 99  96 - 112 mEq/L    CO2 25  19 - 32 mEq/L    Glucose, Bld 115 (*) 70 - 99 mg/dL    BUN 11  6 - 23 mg/dL    Creatinine, Ser 8.29  0.50 - 1.10 mg/dL    Calcium 9.6  8.4 - 56.2 mg/dL    Total Protein 7.3  6.0 - 8.3 g/dL    Albumin 3.8  3.5 - 5.2 g/dL    AST 29  0 - 37 U/L    ALT 19  0 - 35 U/L    Alkaline Phosphatase 70  39 - 117 U/L    Total Bilirubin 0.2 (*) 0.3 - 1.2 mg/dL    GFR calc non Af Amer 75 (*) >90 mL/min    GFR calc Af Amer 87 (*) >90 mL/min   CBC     Status: Abnormal   Collection Time   09/24/11  7:15 PM      Component Value Range Comment   WBC 7.8  4.0 - 10.5 K/uL    RBC 4.07  3.87 - 5.11 MIL/uL    Hemoglobin 12.4  12.0 - 15.0 g/dL    HCT 13.0  86.5 - 78.4 %    MCV 92.4  78.0 - 100.0 fL    MCH 30.5  26.0 - 34.0 pg    MCHC 33.0  30.0 - 36.0 g/dL    RDW 69.6 (*) 29.5 - 15.5 %    Platelets 394  150 - 400 K/uL   DIFFERENTIAL     Status: Normal   Collection Time   09/24/11  7:15 PM      Component Value Range Comment   Neutrophils Relative 47  43 - 77 %    Neutro Abs 3.7  1.7 - 7.7 K/uL  Lymphocytes Relative 38  12 - 46 %    Lymphs Abs 3.0  0.7 - 4.0 K/uL    Monocytes Relative 10  3 - 12 %    Monocytes Absolute 0.8  0.1 - 1.0 K/uL    Eosinophils Relative 3  0 - 5 %    Eosinophils Absolute 0.3  0.0 - 0.7 K/uL    Basophils Relative 1  0 - 1 %    Basophils Absolute 0.0  0.0 - 0.1 K/uL   PROTIME-INR     Status: Abnormal   Collection Time   09/25/11  6:10 AM      Component Value Range Comment   Prothrombin Time 21.5 (*) 11.6 - 15.2 seconds    INR 1.83 (*) 0.00  - 1.49    Review of Systems:  Neurological: The patient denies any headaches today. She denies any seizures or dizziness.  G.I.: The patient denies any constipation or G.I. Upset today.  Musculoskeletal: The patient denies any muscle or skeletal difficulties.   Time was spent today discussing with the patient her current symptoms. The patient states that she slept well last night and reports a good appetite. The patient reports ongoing mild feelings of sadness, anhedonia and depressed mood and denies any suicidal or homicidal ideations. The patient also reports mild anxiety. She denies any auditory or visual hallucinations or delusional thinking today but continues to be concerned about her blood levels and Coumadin dosage.  The patient also continues to be concerned about her prn Valium and Hydrocodone stating the dosages are usually due late at night.  It was discussed with the patient the possibility of changing these medications to scheduled dosages and she agreed.  Treatment Plan Summary:  1. Daily contact with patient to assess and evaluate symptoms and progress in treatment.  2. Medication management  3. The patient will deny suicidal ideations or homicidal ideations for 48 hours prior to discharge and have a depression and anxiety rating of 3 or less. The patient will also deny any auditory or visual hallucinations or delusional thinking.  4. The patient will deny any symptoms of substance withdrawal at time of discharge.   Plan:  1. Will continue the medication Risperdal at 4 mgs po qhs for paranoid delusions.  2. Will continue the medication Valium at 5 mgs po q am, 2 pm and hs for anxiety and panic symptoms.  This will be changed from prn to scheduled dosing. 3. Will continue the medication Hydrocodone 5 mgs/ASAP 325 mgs po q am, 2 pm and hs for moderate to severe pain.  This will be changed from prn to scheduled dosing. 4. Will continue the medication Warfarin as prescribed. This will  be followed by pharmacy.  5. Will continue to monitor.  6. Discharge once the patient's INR is stable.    Alvino Lechuga 09/25/2011, 5:15 PM

## 2011-09-25 NOTE — Progress Notes (Signed)
Pt resting in bed with eyes closed.  No distress observed.  Safety maintained with q15 minute checks. 

## 2011-09-25 NOTE — Progress Notes (Signed)
Patient ID: [name], female   DOB: [birth year], 41 y.o.   MRN: [id number] D- Pt reports she slept well and that her appetite is good.  Her energy level is normal and her ability to pay attention is good.  She requested prn risperdal this am saying she had a panic attack.  Pt's affect very falflat while reporting this.  She is concerned and focused on being constipated  with no BM for 5 days.  She is also requesting her valium and pain med early "to get on a better schedule."  She is also concerned about her lab results.  A informed MD of pt's concerns.  R  Pt given mag citrate and is awaiting other order changes.

## 2011-09-25 NOTE — Progress Notes (Signed)
ANTICOAGULATION CONSULT NOTE - Follow Up Consult  Pharmacy Consult for coumadin Indication: VTE prophylaxis  Allergies  Allergen Reactions  . Darvocet (Propoxyphene-Acetaminophen)     Patient Measurements: Height: 5\' 5"  (165.1 cm) Weight: 222 lb (100.699 kg) IBW/kg (Calculated) : 57     Labs:  Basename 09/25/11 0610 09/24/11 1915 09/24/11 0610 09/23/11 0648  HGB -- 12.4 12.3 --  HCT -- 37.6 38.5 --  PLT -- 394 337 --  APTT -- -- -- --  LABPROT 21.5* -- 21.7* 21.8*  INR 1.83* -- 1.85* 1.86*  HEPARINUNFRC -- -- -- --  CREATININE -- 0.93 -- --  CKTOTAL -- -- -- --  CKMB -- -- -- --  TROPONINI -- -- -- --    Estimated Creatinine Clearance: 92.7 ml/min (by C-G formula based on Cr of 0.93).   Medications:  Scheduled:    . enoxaparin (LOVENOX) injection  85 mg Subcutaneous Q12H  . magnesium citrate  1 Bottle Oral NOW  . nicotine  21 mg Transdermal Q0600  . risperiDONE  4 mg Oral QHS  . warfarin  10 mg Oral ONCE-1800  . warfarin  10 mg Oral ONCE-1800  . Warfarin - Pharmacist Dosing Inpatient   Does not apply q1800  . DISCONTD: warfarin  7.5 mg Oral ONCE-1800    Assessment: INR below goal of 2-3. Patient experiencing bruising at lovenox injection sites. Expect increase in INR due to increased coumadin doses with next lab.   Goal of Therapy:  INR 2-3    Plan:  Coumadin 10 mg x 1 today at 1800.  PT/INR am labs 09/26/11. F/u in am   [name] 09/25/2011,8:36 AM

## 2011-09-25 NOTE — Progress Notes (Signed)
[facility] Group Notes:  (Counselor/Nursing/MHT/Case Management/Adjunct)  09/25/2011 2:41 PM  Type of Therapy:  Group Therapy  Participation Level:  Did Not Attend    [name] 09/25/2011, 2:41 PM

## 2011-09-26 LAB — PROTIME-INR
INR: 2.09 — ABNORMAL HIGH (ref 0.00–1.49)
Prothrombin Time: 23.8 seconds — ABNORMAL HIGH (ref 11.6–15.2)

## 2011-09-26 MED ORDER — RISPERIDONE 4 MG PO TABS: 4.0000 mg | ORAL_TABLET | Freq: Every day | ORAL | Status: DC

## 2011-09-26 MED ORDER — WARFARIN SODIUM 5 MG PO TABS: ORAL_TABLET | ORAL | Status: DC

## 2011-09-26 MED ORDER — WARFARIN SODIUM 2.5 MG PO TABS: 2.5000 mg | ORAL_TABLET | Freq: Every day | ORAL | Status: DC

## 2011-09-26 MED ORDER — DIAZEPAM 5 MG PO TABS: 5.0000 mg | ORAL_TABLET | ORAL | Status: DC

## 2011-09-26 MED ORDER — WARFARIN SODIUM 7.5 MG PO TABS
7.5000 mg | ORAL_TABLET | Freq: Once | ORAL | Status: AC
  Administered 2011-09-26: 7.5 mg via ORAL
  Filled 2011-09-26: qty 1

## 2011-09-26 MED ORDER — WARFARIN SODIUM 5 MG PO TABS
5.0000 mg | ORAL_TABLET | Freq: Every day | ORAL | Status: DC
  Filled 2011-09-26: qty 1

## 2011-09-26 MED ORDER — HYDROCODONE-ACETAMINOPHEN 5-325 MG PO TABS: 1.0000 | ORAL_TABLET | ORAL | Status: DC

## 2011-09-26 NOTE — Progress Notes (Signed)
09/26/2011         Time: 0930      Group Topic/Focus: The focus of this group is on enhancing the patient's understanding of leisure, barriers to leisure, and the importance of engaging in positive leisure activities upon discharge for improved total health.   Participation Level: Minimal  Participation Quality: Attentive  Affect: Blunted  Cognitive: Alert   Additional Comments: Patient flat, but smiled brightly when reporting she would be discharged today. Patient reported reading was her primary interest, but said little else during group discussion.   [name] 09/26/2011 11:54 AM

## 2011-09-26 NOTE — Progress Notes (Signed)
Patient ID: [name], female   DOB: 09/28/1970, 41 y.o.   MRN: [id number] Pt. Discharged home was picked up by her boyfriend (mickie). Pt. And he voiced understanding of discharge instruction and of follow- up. Coumadin instruction explained  To boyfriend and he voiced understanding. Pt. Denies thought of harming self and all belonging taken home with her.

## 2011-09-26 NOTE — Progress Notes (Signed)
[facility] Group Notes:  (Counselor/Nursing/MHT/Case Management/Adjunct)  09/26/2011 1:32 PM  Type of Therapy:  Group Therapy 11:15, Psycho-education 1:15  Participation Level:  Active  Participation Quality:  Attentive and Sharing  Affect:  Appropriate and Depressed  Cognitive:  Oriented  Insight:  Limited  Engagement in Group:  Good  Engagement in Therapy:  Good  Modes of Intervention:  Education, Problem-solving and Support  Summary of Progress/Problems: Patient is eager to go home. Feels good that her coumadin level was stabilized. Plans to make lifestyle changes of not smoking so much, healthy food, getting sleep, so that she can keep coumadin level therapeutic. Quiet during afternoon group. She was taken out two times by staff to prepare for discharge.   Javan Gonzaga, Aram Beecham 09/26/2011, 1:32 PM

## 2011-09-26 NOTE — Progress Notes (Signed)
Pt resting in bed with eyes closed.  No distress observed.  Safety maintained with q15 minute checks. 

## 2011-09-26 NOTE — Tx Team (Signed)
Interdisciplinary Treatment Plan Update (Adult)  Date:  09/26/2011  Time Reviewed:  10:15AM-11:15AM  Progress in Treatment: Attending groups:  Yes Participating in groups:  Yes   Taking medication as prescribed:    Yes, no refusals Tolerating medication: Yes, no side effects have been reported by patient or noted by staff   Family/Significant other contact made:  Yes Patient understands diagnosis:   Yes Discussing patient identified problems/goals with staff:   Yes Medical problems stabilized or resolved:   Yes, needs to go to Urgent Care on Friday for blood draw re Coumadin Denies suicidal/homicidal ideation:  Yes Issues/concerns per patient self-inventory:   None Other:    New problem(s) identified: No, Describe:    Reason for Continuation of Hospitalization: None  Interventions implemented related to continuation of hospitalization:  Medication monitoring and adjustment, safety checks Q15 min., suicide risk assessment, group therapy, psychoeducation, collateral contact, aftercare planning, ongoing physician assessments, medication education - UNTIL DISCHARGE  Additional comments:  Not applicable  Estimated length of stay:   Discharge today  Discharge Plan:  Return to live with her boyfriend, follow up with a Comprehensive Clinical Assessment by [facility] to decide if patient is eligible for [employer] Team services before leaving, follow up with [facility] for medication management, follow up with [facility] for medical needs.  New goal(s):  Not applicable  Review of initial/current patient goals per problem list:   1.  Goal(s):  Eliminate paranoia, reduce psychosis to a manageable level  Met:  Yes  Target date:  By Discharge   As evidenced by:  Previously accomplished, remains stable  2.  Goal(s):  Identify outpt provider  Met:  Yes  Target date:  By Discharge   As evidenced by:  See notes above  3.  Goal(s):  Contact boyfriend  for collateral info  Met:  Yes  Target date:  By Discharge   As evidenced by:  Previously accomplished  4.  Goal(s):  Medication Management  Met:  Yes  Target date:  By Discharge   As evidenced by:  Stable for discharge  Attendees: Patient:  [name]  09/26/2011 10:15AM-11:15AM  Family:     Physician:  Dr. Harvie Heck Readling 09/26/2011 10:15AM-11:15AM  Nursing:   Roswell Miners, RN 09/26/2011 10:15AM -11:15AM   Case Manager:  Ambrose Mantle, LCSW 09/26/2011 10:15AM-11:15AM  Counselor:  Veto Kemps, MT-BC 09/26/2011 10:15AM-11:15AM  Other:   Abbie Sons, RN 09/26/2011   Other:      Other:      Other:       Scribe for Treatment Team:   Sarina Ser, 09/26/2011, 10:15AM-11:15AM

## 2011-09-26 NOTE — Discharge Planning (Signed)
Met with patient in Aftercare Planning Group.   She is excited that her Coumadin level is at therapeutic level.  She anticipates discharge today.  She wants to know how long she should wait before going to see her doctor at [facility].  She expressed understanding re her Comprehensive Clinical Assessment scheduled for 1:45pm today by [facility], and also expressed understanding re her appointment Thursday 09/27/11 at 3:00 pm with Clerance Lav.  If discharged in time to get home for 1:45 appointment, Case manager will contact [facility] and ask them to go to patient's house instead of to this hospital for the assessment; otherwise, patient could go home after the assessment today.  Patient has reported that she is set up with [employer] for [employer] transportation and thus can make it to her appointments without issue.  Ambrose Mantle, LCSW 09/26/2011, 9:33 AM

## 2011-09-26 NOTE — Progress Notes (Signed)
Patient ID: [name], female   DOB: 1970/11/10, 41 y.o.   MRN: [id number] Has been up and about interacting a little with peers and staff. She denies thought of [facility].  Says that she is ready to go home.

## 2011-09-26 NOTE — [facility] Suicide Risk Assessment (Signed)
Suicide Risk Assessment  Discharge Assessment     Demographic factors:  Caucasian  Current Mental Status Per Nursing Assessment::   On Admission:    At Discharge:  The patient is AO x 3 and remained friendly and cooperative with this Physician.  The patient denies any significant feelings of sadness, anhedonia or depressed mood as well as adamantly denies any suicidal or homicidal ideations.  The patient reports some mild anxiety and adamantly denies any auditory or visual hallucinations or delusional thinking.  She denies any medication related side effects or other concerns today.   Current Mental Status Per Physician:  Diagnosis:  Axis I: Schizophrenia - Paranoid Type.  Posttraumatic Stress Disorder.   The patient was seen today and reports the following:   ADL's: Intact.  Sleep: The patient reports to sleeping well last night without difficulty.  Appetite: The patient reports a good appetite this morning.   Mild>(1-10) >Severe  Hopelessness (1-10): 0  Depression (1-10): 0  Anxiety (1-10): 2   Suicidal Ideation: The patient adamantly denies any suicidal ideations today.  Plan: No  Intent: No  Means: No   Homicidal Ideation: The patient adamantly denies any homicidal ideations today.  Plan: No  Intent: No.  Means: No   General Appearance/Behavior: The patient remained friendly and cooperative today with this provider.  Eye Contact: Good.  Speech: Appropriate in rate and volume with no pressuring noted today.  Motor Behavior: wnl.  Level of Consciousness: Alert and Oriented x 3.  Mental Status: Alert and Oriented x 3.  Mood: Appears mildly depressed today.  Affect: Appears mildly constricted.  Anxiety Level: Mild anxiety reported today.  Thought Process: wnl.  Thought Content: The patient denies any auditory or visual hallucinations today as well as any delusional thinking.  Perception: wnl.  Judgment: Good.  Insight: Good.  Cognition: Oriented to person, place  and time.   Loss Factors: Concerned about health issues.  Historical Factors: History or Depression.  History of Pulmonary Emboli.  Risk Reduction Factors:   Good Primary Support System.  Good Access to Healthcare. Good Insight into Illness.  Continued Clinical Symptoms:  Schizophrenia:   Paranoid or undifferentiated type More than one psychiatric diagnosis Previous Psychiatric Diagnoses and Treatments Medical Diagnoses and Treatments/Surgeries  Discharge Diagnoses:   AXIS I:   Schizophrenia - Paranoid Type.    Posttraumatic Stress Disorder.  AXIS II:   Deferred. AXIS III:   1.  History of Pulmonary Emboli. AXIS IV:   Chronic Mental Illness.  Serious Chronic Physical Illness. AXIS V:   GAF at time of admission approximately 40.  GAF at time of discharge approximately 50.  Cognitive Features That Contribute To Risk:  None Noted.   Review of Systems:  Neurological: The patient denies any headaches today. She denies any seizures or dizziness.  G.I.: The patient denies any constipation or G.I. Upset today.  Musculoskeletal: The patient denies any muscle or skeletal difficulties.   Time was spent today discussing with the patient her current symptoms.  The patient is AO x 3 and remained friendly and cooperative with this Physician.  The patient denies any significant feelings of sadness, anhedonia or depressed mood as well as adamantly denies any suicidal or homicidal ideations.  The patient reports some mild anxiety and adamantly denies any auditory or visual hallucinations or delusional thinking.  She denies any medication related side effects or other concerns today.   Treatment Plan Summary:  1. Daily contact with patient to assess and evaluate symptoms and  progress in treatment.  2. Medication management  3. The patient will deny suicidal ideations or homicidal ideations for 48 hours prior to discharge and have a depression and anxiety rating of 3 or less. The patient will also  deny any auditory or visual hallucinations or delusional thinking.  4. The patient will deny any symptoms of substance withdrawal at time of discharge.   Plan:  1. Will continue the medication Risperdal at 4 mgs po qhs for paranoid delusions.  2. Will continue the medication Valium at 5 mgs po q am, 2 pm and hs for anxiety and panic symptoms. .  3. Will continue the medication Hydrocodone 5 mgs/ASAP 325 mgs po q am, 2 pm and hs for moderate to severe pain.   4. Will continue the medication Warfarin as prescribed. This will be followed by pharmacy.  5. Will continue to monitor.  6. Discharge today to outpatient follow up.   Suicide Risk:  Minimal: No identifiable suicidal ideation.  Patients presenting with no risk factors but with morbid ruminations; may be classified as minimal risk based on the severity of the depressive symptoms  Plan Of Care/Follow-up recommendations:  Activity:  As tolerated. Diet:  Heart Healthy. Other:  Please take all medications only as directed and keep all scheduled follow up appointments.  [name] 09/26/2011, 12:17 PM

## 2011-09-26 NOTE — Discharge Summary (Signed)
Physician Discharge Summary Note  Patient:  [name] is an 41 y.o., female 7 Days MRN:  [id number] DOB:  1970/06/17 Patient phone:  [phone] (home)  Patient address:   [address] [place] [address],   Date of Admission:  09/19/2011 Date of Discharge: 09/26/2011  Reason for Admission: psychosis with paranoid ideation  Discharge Diagnoses: Principal Problem:  *Schizophrenia, paranoid Active Problems:  Post traumatic stress disorder (PTSD)   Axis Diagnosis:  Discharge Diagnoses:  AXIS I: Schizophrenia - Paranoid Type.  Posttraumatic Stress Disorder.  AXIS II: Deferred.  AXIS III: 1. History of Pulmonary Emboli.  AXIS IV: Chronic Mental Illness. Serious Chronic Physical Illness.  AXIS V: GAF at time of admission approximately 40. GAF at time of discharge approximately 50.  Level of Care:  OP  Hospital Course:  [name] was admitted after being brought to the hospital by her boyfriend because she was not eating or attending to her activities of daily living including taking her medication as prescribed or leaving the house.  She was noted to have been recently in patient at [facility] hospital.([facility] was contacted for medical records which were not completed at the time of the request.)  Her medical history was quite significant in that she has had a number of problems including a pulmonary embolism for which she was taking coumadin.     [name] was continued on diazepam for her back pain and anxiety as well as hydrocodone for her back pain as well. Her other home medications were restarted as well.  Her PT/INR was managed by the Pharm. D and was stable upon discharge.     [name] was in the Psychiatric ED for 5 days prior to admission and was followed by the ED psychiatrist who initiated her medications.  She was here at [facility] the previous month for non-compliance with her medication and was referred to [facility] by our ED.       Once on the unit [name] was  started again on her home medications as well as risperidone 4mg  po at HS.  She was given trazodone 100mg  po at qhs prn for sleep.  Risperidone 2mg  was available q 6 hrs if needed for agitation and irritability.  [name] was also encouraged to participate in daily groups on the unit, as well as individual time with the clinical provider each day.  She was followed by a case Production designer, theatre/television/film and a Child psychotherapist as well.  Her progress was monitored by daily visits with the clinical providers, daily self inventory to assess her mental and emotional status which is completed by the patient, and interaction with the nursing staff.  Her symptoms of paranoia and disorganization began to clear with significant reduction by day 5 of her admission. On day 6 of her admission she was ready for discharge and plans for follow up were scheduled with [facility] who came to the hospital to assess her needs prior to her discharge home on the next morning. Consults:  None  Significant Diagnostic Studies:  None  Discharge Vitals:   Blood pressure 110/83, pulse 106, temperature 97.1 F (36.2 C), temperature source Oral, resp. rate 16, height 5\' 5"  (1.651 m), weight 100.699 kg (222 lb).  Mental Status Exam: See Mental Status Examination and Suicide Risk Assessment completed by Attending Physician prior to discharge.  Discharge destination:  Home  Is patient on multiple antipsychotic therapies at discharge:  No   Has Patient had three or more failed trials of antipsychotic  monotherapy by history:  No  Recommended Plan for Multiple Antipsychotic Therapies: No  Discharge Orders    Future Orders Please Complete By Expires   Diet - low sodium heart healthy      Increase activity slowly      Discharge instructions      Comments:   You will need to have your PT-INR tested on Friday.  Please call to get this scheduled.  Be sure to keep all scheduled appointments to make sure you get your medications refilled in a  timely manner.  You should not skip or miss any doses of your medication!     Medication List  As of 09/26/2011  9:48 AM   STOP taking these medications         acetaminophen 325 MG tablet      aspirin 325 MG tablet      benztropine 1 MG tablet      cyclobenzaprine 10 MG tablet         TAKE these medications      Indication    diazepam 5 MG tablet   Commonly known as: VALIUM   Take 1 tablet (5 mg total) by mouth 3 (three) times daily at 8am, 2pm and bedtime. For anxiety/panic/       HYDROcodone-acetaminophen 5-325 MG per tablet   Commonly known as: NORCO   Take 1 tablet by mouth 3 (three) times daily at 8am, 2pm and bedtime. For pain       risperidone 4 MG tablet   Commonly known as: RISPERDAL   Take 1 tablet (4 mg total) by mouth at bedtime. For psychosis       warfarin 5 MG tablet   Commonly known as: COUMADIN   Take 1 tablet Thursday 6/27 at 6pm then resume home dose of 1 tablet (5mg ) daily at 6pm on Monday, Wed, Friday, Sat, and Sun and 1/2 tablet (2.5mg ) on Tuesday and Thursday.  PT/INR needs to be drawn on Friday 6/27 AM.            Follow-up Information    Follow up with Clerance Lav for medication management on 09/27/2011. (3:00PM appointment)    Contact information:   [facility] [address] [place]  [address] Telephone:  [phone]         Follow-up recommendations:  [employer] for services, [place] for transportation to clinics and MDs offices for appointments.  Comments:  This patient is worrisome for non-compliance with her medications.  Signed: Rona Ravens. Linette Gunderson PAC For Dr. Harvie Heck D. Readling 09/26/2011, 9:48 AM

## 2011-09-26 NOTE — Progress Notes (Signed)
[facility] Case Management Discharge Plan:  Will you be returning to the same living situation after discharge: Yes,  lives with boyfriend At discharge, do you have transportation home?:Yes,  boyfriend will pick up Do you have the ability to pay for your medications:Yes,  insurance and income  Interagency Information:     Release of information consent forms completed and in the chart;  Patient's signature needed at discharge.  Patient to Follow up at:  Follow-up Information    Follow up with Clerance Lav for medication management on 09/27/2011. (3:00PM appointment)    Contact information:   [facility] [address] [place]  [address] Telephone:  [phone]      Follow up with [facility] on 09/26/2011. (1:45PM appointment for assessment, then they will follow up with you about setting up services as needed)    Contact information:   [facility] [address] [place] Telephone:  [phone]      Follow up with Tamera Punt, NP on 09/28/2011. (Go for a blood draw.  Ask if she can prescribe your Risperdal)    Contact information:   [facility]  [address] [place]  [address] Phone:  [phone]          Patient denies SI/HI:   Yes,      Safety Planning and Suicide Prevention discussed:  Yes,  During Aftercare Planning Groups throughout her stay, Case Manager provided psychoeducation on "Suicide Prevention Information."  This included descriptions of risk factors for suicide, warning signs that an individual is in crisis and thinking of suicide, and what to do if this occurs.  Pt indicated understanding of information provided, and will read brochure given upon discharge.     Barrier to discharge identified:No.  Summary and Recommendations:  Go weekly to [facility] for blood draws to evaluate Coumadin.  Follow up with [facility] as recommended by the results of  their assessment today.  Go to [facility] for medication management.   Sarina Ser 09/26/2011, 11:02 AM

## 2011-09-28 NOTE — Progress Notes (Signed)
Patient Discharge Instructions:  After Visit Summary (AVS):   Faxed to:  09/27/2011 Psychiatric Admission Assessment Note:   Faxed to:  09/27/2011 Suicide Risk Assessment - Discharge Assessment:   Faxed to:  09/27/2011 Faxed/Sent to the Next Level Care provider:  09/27/2011  Faxed to [facility] @ [phone] And to [facility] - Tamera Punt, NP @ [phone] and to [facility] Treatment Team @ [phone]  Wandra Scot, 09/28/2011, 9:52 AM

## 2011-10-07 ENCOUNTER — Emergency Department (HOSPITAL_COMMUNITY)
Admission: EM | Admit: 2011-10-07 | Discharge: 2011-10-07 | Disposition: A | Discharge status: Home / Self Care | Payer: Medicaid Other | Attending: Emergency Medicine | Admitting: Emergency Medicine

## 2011-10-07 ENCOUNTER — Emergency Department (HOSPITAL_COMMUNITY)
Admission: EM | Admit: 2011-10-07 | Discharge: 2011-10-07 | Disposition: A | Discharge status: Home / Self Care | Payer: Medicaid Other | Source: Home / Self Care | Attending: Emergency Medicine | Admitting: Emergency Medicine

## 2011-10-07 ENCOUNTER — Encounter (HOSPITAL_COMMUNITY): Payer: Self-pay | Admitting: *Deleted

## 2011-10-07 ENCOUNTER — Encounter (HOSPITAL_COMMUNITY): Payer: Self-pay

## 2011-10-07 DIAGNOSIS — F411 Generalized anxiety disorder: Secondary | ICD-10-CM | POA: Insufficient documentation

## 2011-10-07 DIAGNOSIS — E876 Hypokalemia: Secondary | ICD-10-CM

## 2011-10-07 DIAGNOSIS — R42 Dizziness and giddiness: Secondary | ICD-10-CM

## 2011-10-07 DIAGNOSIS — Z86711 Personal history of pulmonary embolism: Secondary | ICD-10-CM | POA: Insufficient documentation

## 2011-10-07 DIAGNOSIS — Z7901 Long term (current) use of anticoagulants: Secondary | ICD-10-CM | POA: Insufficient documentation

## 2011-10-07 DIAGNOSIS — F209 Schizophrenia, unspecified: Secondary | ICD-10-CM | POA: Insufficient documentation

## 2011-10-07 DIAGNOSIS — N390 Urinary tract infection, site not specified: Secondary | ICD-10-CM

## 2011-10-07 DIAGNOSIS — F419 Anxiety disorder, unspecified: Secondary | ICD-10-CM

## 2011-10-07 DIAGNOSIS — D72829 Elevated white blood cell count, unspecified: Secondary | ICD-10-CM | POA: Insufficient documentation

## 2011-10-07 DIAGNOSIS — Z86718 Personal history of other venous thrombosis and embolism: Secondary | ICD-10-CM | POA: Insufficient documentation

## 2011-10-07 DIAGNOSIS — F17200 Nicotine dependence, unspecified, uncomplicated: Secondary | ICD-10-CM | POA: Insufficient documentation

## 2011-10-07 DIAGNOSIS — Z8659 Personal history of other mental and behavioral disorders: Secondary | ICD-10-CM | POA: Insufficient documentation

## 2011-10-07 LAB — CBC WITH DIFFERENTIAL/PLATELET
Basophils Relative: 0 % (ref 0–1)
Eosinophils Absolute: 0.2 10*3/uL (ref 0.0–0.7)
Lymphs Abs: 2.7 10*3/uL (ref 0.7–4.0)
MCH: 31.9 pg (ref 26.0–34.0)
MCHC: 35.2 g/dL (ref 30.0–36.0)
Neutro Abs: 10.7 10*3/uL — ABNORMAL HIGH (ref 1.7–7.7)
Neutrophils Relative %: 71 % (ref 43–77)
Platelets: 531 10*3/uL — ABNORMAL HIGH (ref 150–400)
RBC: 4.3 MIL/uL (ref 3.87–5.11)

## 2011-10-07 LAB — URINALYSIS, ROUTINE W REFLEX MICROSCOPIC
Leukocytes, UA: NEGATIVE
Nitrite: NEGATIVE
Protein, ur: NEGATIVE mg/dL
Specific Gravity, Urine: 1.009 (ref 1.005–1.030)
Urobilinogen, UA: 0.2 mg/dL (ref 0.0–1.0)

## 2011-10-07 LAB — BASIC METABOLIC PANEL
Calcium: 9.9 mg/dL (ref 8.4–10.5)
GFR calc Af Amer: 87 mL/min — ABNORMAL LOW (ref >90)
GFR calc non Af Amer: 75 mL/min — ABNORMAL LOW (ref >90)
Sodium: 134 mEq/L — ABNORMAL LOW (ref 135–145)

## 2011-10-07 LAB — URINE MICROSCOPIC-ADD ON

## 2011-10-07 LAB — PROTIME-INR
INR: 3.73 — ABNORMAL HIGH (ref 0.00–1.49)
Prothrombin Time: 37.5 seconds — ABNORMAL HIGH (ref 11.6–15.2)

## 2011-10-07 MED ORDER — MAGNESIUM SULFATE 40 MG/ML IJ SOLN
2.0000 g | INTRAMUSCULAR | Status: AC
  Administered 2011-10-07: 2 g via INTRAVENOUS
  Filled 2011-10-07: qty 50

## 2011-10-07 MED ORDER — SODIUM CHLORIDE 0.9 % IV BOLUS (SEPSIS)
1000.0000 mL | Freq: Once | INTRAVENOUS | Status: AC
  Administered 2011-10-07: 1000 mL via INTRAVENOUS

## 2011-10-07 MED ORDER — NITROFURANTOIN MONOHYD MACRO 100 MG PO CAPS: 100.0000 mg | ORAL_CAPSULE | Freq: Two times a day (BID) | ORAL | Status: AC

## 2011-10-07 MED ORDER — POTASSIUM CHLORIDE CRYS ER 20 MEQ PO TBCR
40.0000 meq | EXTENDED_RELEASE_TABLET | Freq: Once | ORAL | Status: AC
  Administered 2011-10-07: 40 meq via ORAL
  Filled 2011-10-07: qty 2

## 2011-10-07 MED ORDER — POTASSIUM CHLORIDE ER 10 MEQ PO TBCR: 10.0000 meq | EXTENDED_RELEASE_TABLET | Freq: Two times a day (BID) | ORAL | Status: DC

## 2011-10-07 MED ORDER — ONDANSETRON HCL 4 MG/2ML IJ SOLN
4.0000 mg | Freq: Once | INTRAMUSCULAR | Status: AC
  Administered 2011-10-07: 4 mg via INTRAVENOUS
  Filled 2011-10-07: qty 2

## 2011-10-07 NOTE — ED Notes (Signed)
Received report from previous RN. Pt resting quietly. Will continue to monitor.

## 2011-10-07 NOTE — ED Provider Notes (Signed)
History     CSN: [id number]  Arrival date & time 10/07/11  0147   First MD Initiated Contact with Patient 10/07/11 0304      Chief Complaint  Patient presents with  . Medical Clearance  . Arm Swelling    (Consider location/radiation/quality/duration/timing/severity/associated sxs/prior treatment) HPI Comments: 41 year old female with a history of pulmonary embolism, schizophrenia, who presents with a complaint of feeling lightheaded and dehydrated. She states that she's had 2 days of vomiting and diarrhea followed by feeling lightheaded today. Today she has been able to eat and drink, denies dysuria, denies rectal bleeding, denies coughing or shortness of breath. She states that she feels "woozy headed when she stands". She has really started a new medication I cannot read the name of this medicine for her schizophrenia. Symptoms are persistent, nothing seems to make them better, worse with standing  The history is provided by the patient and a relative.    Past Medical History  Diagnosis Date  . Pulmonary emboli   . Schizophrenia     Past Surgical History  Procedure Date  . No past surgeries     History reviewed. No pertinent family history.  History  Substance Use Topics  . Smoking status: Current Everyday Smoker -- 3.0 packs/day for 26 years    Types: Cigarettes  . Smokeless tobacco: Never Used  . Alcohol Use: No    OB History    Grav Para Term Preterm Abortions TAB SAB Ect Mult Living                  Review of Systems  All other systems reviewed and are negative.    Allergies  Darvocet  Home Medications   Current Outpatient Rx  Name Route Sig Dispense Refill  . DIAZEPAM 5 MG PO TABS Oral Take 1 tablet (5 mg total) by mouth 3 (three) times daily at 8am, 2pm and bedtime. For anxiety/panic/ 90 tablet 0  . HYDROCODONE-ACETAMINOPHEN 5-325 MG PO TABS Oral Take 1 tablet by mouth 3 (three) times daily at 8am, 2pm and bedtime. For pain 90 tablet 0  .  RISPERIDONE 4 MG PO TABS Oral Take 1 tablet (4 mg total) by mouth at bedtime. For psychosis 30 tablet 0  . WARFARIN SODIUM 5 MG PO TABS  Take 1 tablet Thursday 6/27 at 6pm then resume home dose of 1 tablet (5mg ) daily at 6pm on Monday, Wed, Friday, Sat, and Sun and 1/2 tablet (2.5mg ) on Tuesday and Thursday.  PT/INR needs to be drawn on Friday 6/27 AM.  This is for DVT prevention. 30 tablet 0    BP 119/85  Pulse 111  Temp 98 F (36.7 C) (Oral)  Resp 20  SpO2 100%  LMP 09/10/2011  Physical Exam  Nursing note and vitals reviewed. Constitutional: She appears well-developed and well-nourished. No distress.  HENT:  Head: Normocephalic and atraumatic.  Mouth/Throat: No oropharyngeal exudate.       Mucous membranes slightly dehydrated  Eyes: Conjunctivae and EOM are normal. Pupils are equal, round, and reactive to light. Right eye exhibits no discharge. Left eye exhibits no discharge. No scleral icterus.  Neck: Normal range of motion. Neck supple. No JVD present. No thyromegaly present.  Cardiovascular: Normal rate, regular rhythm, normal heart sounds and intact distal pulses.  Exam reveals no gallop and no friction rub.   No murmur heard. Pulmonary/Chest: Effort normal and breath sounds normal. No respiratory distress. She has no wheezes. She has no rales.  Abdominal: Soft. Bowel sounds are  normal. She exhibits no distension and no mass. There is no tenderness.  Musculoskeletal: Normal range of motion. She exhibits no edema and no tenderness.  Lymphadenopathy:    She has no cervical adenopathy.  Neurological: She is alert. Coordination normal.       Ambulatory in the room without difficulty, grips normal bilaterally, normal strength in all 4 extremities, no edema  Skin: Skin is warm and dry. No rash noted. No erythema.  Psychiatric: She has a normal mood and affect. Her behavior is normal.    ED Course  Procedures (including critical care time)   Labs Reviewed  PROTIME-INR  CBC WITH  DIFFERENTIAL  URINALYSIS, ROUTINE W REFLEX MICROSCOPIC   No results found.   No diagnosis found.    MDM  Overall the patient does not appear to be acutely ill, has normal blood pressure, afebrile, oxygen saturation 100%. We'll check Coumadin level, labs to rule out hypokalemia or urinary tract infection.  INR elevated, otherwise the labs show a leukocytosis of 15,000 which at this time is unexplained, potassium of 3.0 which has been replaced with magnesium sulfate and potassium. The patient has been given 2 L of IV fluids, I have reevaluated her and she states that she feels much better, she is ambulated in the department without difficulty and has vital signs which are normal including no tachycardia, no hypotension, no respiratory distress and no fever.  Urinalysis is pending at this time, care discussed with Dr. Karma Ganja who will assume care of the patient at this time.      Vida Roller, MD 10/07/11 [phone]

## 2011-10-07 NOTE — ED Notes (Signed)
Discharge instructions explained to pt; pt verbalized understanding of instructions.

## 2011-10-07 NOTE — ED Provider Notes (Signed)
History     CSN: [id number]  Arrival date & time 10/07/11  1057   First MD Initiated Contact with Patient 10/07/11 1205      Chief Complaint  Patient presents with  . Anxiety    (Consider location/radiation/quality/duration/timing/severity/associated sxs/prior treatment) HPI Pt was seen earlier today and d/c to the waiting room. Pt states she called her boyfriend several times to pick her up and got no response. She checked back in because she did not have a ride and was getting anxious. Pt denied SI/HI, hallucination, N/V, abdominal pain. When questioned specifically about her urine stated she was having some frequency and dysuria. Earlier UA contaminated but with many bacteria.  Past Medical History  Diagnosis Date  . Pulmonary emboli   . Schizophrenia     Past Surgical History  Procedure Date  . No past surgeries     History reviewed. No pertinent family history.  History  Substance Use Topics  . Smoking status: Current Everyday Smoker -- 3.0 packs/day for 26 years    Types: Cigarettes  . Smokeless tobacco: Never Used  . Alcohol Use: No    OB History    Grav Para Term Preterm Abortions TAB SAB Ect Mult Living                  Review of Systems  Constitutional: Negative for fever and chills.  Gastrointestinal: Negative for nausea, vomiting and abdominal pain.  Genitourinary: Positive for dysuria and frequency. Negative for flank pain.  Neurological: Negative for weakness, light-headedness, numbness and headaches.  Psychiatric/Behavioral: Negative for suicidal ideas, hallucinations and self-injury. The patient is nervous/anxious.     Allergies  Darvocet  Home Medications   Current Outpatient Rx  Name Route Sig Dispense Refill  . DIAZEPAM 5 MG PO TABS Oral Take 1 tablet (5 mg total) by mouth 3 (three) times daily at 8am, 2pm and bedtime. For anxiety/panic/ 90 tablet 0  . HYDROCODONE-ACETAMINOPHEN 5-325 MG PO TABS Oral Take 1 tablet by mouth 3 (three) times  daily at 8am, 2pm and bedtime. For pain 90 tablet 0  . POTASSIUM CHLORIDE ER 10 MEQ PO TBCR Oral Take 1 tablet (10 mEq total) by mouth 2 (two) times daily. 20 tablet 0  . RISPERIDONE 4 MG PO TABS Oral Take 1 tablet (4 mg total) by mouth at bedtime. For psychosis 30 tablet 0  . WARFARIN SODIUM 5 MG PO TABS  Take 1 tablet Thursday 6/27 at 6pm then resume home dose of 1 tablet (5mg ) daily at 6pm on Monday, Wed, Friday, Sat, and Sun and 1/2 tablet (2.5mg ) on Tuesday and Thursday.  PT/INR needs to be drawn on Friday 6/27 AM.  This is for DVT prevention. 30 tablet 0  . NITROFURANTOIN MONOHYD MACRO 100 MG PO CAPS Oral Take 1 capsule (100 mg total) by mouth 2 (two) times daily. 10 capsule 0    BP 105/66  Pulse 88  Temp 97.9 F (36.6 C) (Oral)  Resp 18  SpO2 99%  LMP 09/10/2011  Physical Exam  Nursing note and vitals reviewed. Constitutional: She is oriented to person, place, and time. She appears well-developed and well-nourished. No distress.  HENT:  Head: Normocephalic and atraumatic.  Mouth/Throat: Oropharynx is clear and moist.  Eyes: EOM are normal. Pupils are equal, round, and reactive to light.  Neck: Normal range of motion. Neck supple.  Cardiovascular: Normal rate and regular rhythm.   Pulmonary/Chest: Effort normal and breath sounds normal. No respiratory distress. She has no wheezes. She  has no rales.  Abdominal: Soft. Bowel sounds are normal. There is no tenderness. There is no rebound and no guarding.  Musculoskeletal: Normal range of motion. She exhibits no edema and no tenderness.  Neurological: She is alert and oriented to person, place, and time.       5/5 motor, sensation intact  Skin: Skin is warm and dry. No rash noted. No erythema.  Psychiatric:       Mild anxiety.    ED Course  Procedures (including critical care time)  Labs Reviewed - No data to display No results found.   1. Anxiety   2. UTI (lower urinary tract infection)       MDM  Will give Rx for  suspected UTI. Pt is not a danger to herself or others. Will d/c home with bus pass. Return for concerns        Loren Racer, MD 10/07/11 1216

## 2011-10-07 NOTE — ED Notes (Signed)
Pt in with anxiety states was just discharged states her boyfriend would not come pick her up and he broke up with her leaving her feeling very anxious requesting admission to [facility], denies s/i denies h/i no apparent distress

## 2011-10-07 NOTE — ED Notes (Signed)
Pt thinks she is reincarnated as a baby and she also has complaints of swelling in arms and hands.  "thinks her coumadin level is off" denies Homicidal or suicidal ideation

## 2011-10-07 NOTE — ED Notes (Signed)
CSW informed by RN that pt may need transportation assistance upon d/c. Writer met with pt by bedside. Pt informed Clinical research associate that she currently resides in [place] and that she has no transportation to get home. CSW explored other avenues of transportation such as family and friends who may be able to transport upon d/c. Pt stated that she does not have anyone who can transport her at this time. [place] service to [place]. CSW informed pt that she will be provided with a taxi cab voucher for assistance this time, due to this service being limited to 1x annually for pts. Pt verbalized her understanding. No other concerns stated to writer upon d/c.  Janann Colonel., MSW, [facility] Clinical Social Worker [phone]

## 2011-10-09 ENCOUNTER — Encounter (HOSPITAL_COMMUNITY): Payer: Self-pay | Admitting: *Deleted

## 2011-10-09 ENCOUNTER — Emergency Department (HOSPITAL_COMMUNITY)
Admission: EM | Admit: 2011-10-09 | Discharge: 2011-10-10 | Disposition: A | Discharge status: Other Acute Inpatient Hospital | Payer: Medicaid Other | Attending: Emergency Medicine | Admitting: Emergency Medicine

## 2011-10-09 ENCOUNTER — Other Ambulatory Visit: Payer: Self-pay | Admitting: Psychiatry

## 2011-10-09 DIAGNOSIS — Z86711 Personal history of pulmonary embolism: Secondary | ICD-10-CM | POA: Insufficient documentation

## 2011-10-09 DIAGNOSIS — Z7901 Long term (current) use of anticoagulants: Secondary | ICD-10-CM | POA: Insufficient documentation

## 2011-10-09 DIAGNOSIS — E876 Hypokalemia: Secondary | ICD-10-CM

## 2011-10-09 DIAGNOSIS — F29 Unspecified psychosis not due to a substance or known physiological condition: Secondary | ICD-10-CM | POA: Insufficient documentation

## 2011-10-09 DIAGNOSIS — H5316 Psychophysical visual disturbances: Secondary | ICD-10-CM | POA: Insufficient documentation

## 2011-10-09 DIAGNOSIS — F17200 Nicotine dependence, unspecified, uncomplicated: Secondary | ICD-10-CM | POA: Insufficient documentation

## 2011-10-09 DIAGNOSIS — F22 Delusional disorders: Secondary | ICD-10-CM | POA: Insufficient documentation

## 2011-10-09 DIAGNOSIS — F209 Schizophrenia, unspecified: Secondary | ICD-10-CM | POA: Insufficient documentation

## 2011-10-09 LAB — CBC
MCH: 31.3 pg (ref 26.0–34.0)
MCHC: 34.6 g/dL (ref 30.0–36.0)
MCV: 90.5 fL (ref 78.0–100.0)
Platelets: 534 10*3/uL — ABNORMAL HIGH (ref 150–400)
RBC: 3.9 MIL/uL (ref 3.87–5.11)
RDW: 15.7 % — ABNORMAL HIGH (ref 11.5–15.5)

## 2011-10-09 LAB — COMPREHENSIVE METABOLIC PANEL
AST: 20 U/L (ref 0–37)
CO2: 23 mEq/L (ref 19–32)
Calcium: 9.2 mg/dL (ref 8.4–10.5)
Creatinine, Ser: 0.92 mg/dL (ref 0.50–1.10)
GFR calc non Af Amer: 76 mL/min — ABNORMAL LOW (ref >90)

## 2011-10-09 LAB — RAPID URINE DRUG SCREEN, HOSP PERFORMED
Amphetamines: NOT DETECTED
Opiates: NOT DETECTED

## 2011-10-09 LAB — SALICYLATE LEVEL: Salicylate Lvl: <2 mg/dL — ABNORMAL LOW (ref 2.8–20.0)

## 2011-10-09 LAB — ACETAMINOPHEN LEVEL: Acetaminophen (Tylenol), Serum: <15 ug/mL (ref 10–30)

## 2011-10-09 MED ORDER — POTASSIUM CHLORIDE ER 10 MEQ PO TBCR
10.0000 meq | EXTENDED_RELEASE_TABLET | Freq: Two times a day (BID) | ORAL | Status: DC
  Administered 2011-10-09 – 2011-10-10 (×2): 10 meq via ORAL
  Filled 2011-10-09 (×3): qty 1

## 2011-10-09 MED ORDER — NICOTINE 21 MG/24HR TD PT24
21.0000 mg | MEDICATED_PATCH | Freq: Every day | TRANSDERMAL | Status: DC
  Administered 2011-10-09 – 2011-10-10 (×2): 21 mg via TRANSDERMAL
  Filled 2011-10-09 (×2): qty 1

## 2011-10-09 MED ORDER — ONDANSETRON HCL 4 MG PO TABS: 4.0000 mg | ORAL_TABLET | Freq: Three times a day (TID) | ORAL | Status: DC | PRN

## 2011-10-09 MED ORDER — RISPERIDONE 2 MG PO TABS
4.0000 mg | ORAL_TABLET | Freq: Every day | ORAL | Status: DC
  Administered 2011-10-09: 4 mg via ORAL
  Filled 2011-10-09: qty 2

## 2011-10-09 MED ORDER — WARFARIN - PHARMACIST DOSING INPATIENT: Freq: Every day | Status: DC

## 2011-10-09 MED ORDER — IBUPROFEN 600 MG PO TABS: 600.0000 mg | ORAL_TABLET | Freq: Three times a day (TID) | ORAL | Status: DC | PRN

## 2011-10-09 MED ORDER — POTASSIUM CHLORIDE CRYS ER 20 MEQ PO TBCR
40.0000 meq | EXTENDED_RELEASE_TABLET | Freq: Once | ORAL | Status: AC
  Administered 2011-10-09: 40 meq via ORAL
  Filled 2011-10-09: qty 2

## 2011-10-09 NOTE — ED Notes (Signed)
Dr. Manus Gunning informed of low Potassium (2.9), will monitor.

## 2011-10-09 NOTE — Progress Notes (Signed)
ANTICOAGULATION CONSULT NOTE - Initial Consult  Pharmacy Consult for Warfarin Indication: pulmonary embolus  Allergies  Allergen Reactions  . Darvocet (Propoxyphene-Acetaminophen)     Unknown     Patient Measurements:    Vital Signs: Temp: 98.2 F (36.8 C) (07/09 1721) Temp src: Oral (07/09 1721) BP: 137/89 mmHg (07/09 1721) Pulse Rate: 96  (07/09 1721)  Labs:  Basename 10/09/11 1630 10/09/11 1610 10/07/11 0325  HGB -- 12.2 13.7  HCT -- 35.3* 38.9  PLT -- 534* 531*  APTT -- -- --  LABPROT 46.6* -- 37.5*  INR 4.93* -- 3.73*  HEPARINUNFRC -- -- --  CREATININE -- 0.92 0.93  CKTOTAL -- -- --  CKMB -- -- --  TROPONINI -- -- --    The CrCl is unknown because both a height and weight (above a minimum accepted value) are required for this calculation.   Medical History: Past Medical History  Diagnosis Date  . Pulmonary emboli   . Schizophrenia     Medications:  Scheduled:    . nicotine  21 mg Transdermal Daily  . potassium chloride  10 mEq Oral BID  . potassium chloride  40 mEq Oral Once  . risperidone  4 mg Oral QHS   Infusions:   PRN: ibuprofen, ondansetron  Assessment: 41 yo on Coumadin PTA for PE and to resume while in hospital. Pt was taking 5mg  on MWFSatSun and 2.5mg  on TTH with last dose today. Admitting INR=4.93  Goal of Therapy:  INR 2-3  Plan:  . No Coumadin tonight . Will check daily PT/INR while on Coumadin.  Dorethea Clan 10/09/2011,6:53 PM
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
## 2011-10-09 NOTE — BHH Counselor (Signed)
Per Beatriz Stallion, RN. Pt's potassium must be at least 3.2 before being considered at [facility].

## 2011-10-09 NOTE — ED Notes (Signed)
Pt denies any si/hi.
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
## 2011-10-10 ENCOUNTER — Inpatient Hospital Stay (HOSPITAL_COMMUNITY)
Admission: AD | Admit: 2011-10-10 | Discharge: 2011-10-18 | DRG: 885 | Disposition: A | Discharge status: Home / Self Care | Payer: Medicaid Other | Source: Ambulatory Visit | Attending: Psychiatry | Admitting: Psychiatry

## 2011-10-10 ENCOUNTER — Encounter (HOSPITAL_COMMUNITY): Payer: Self-pay

## 2011-10-10 DIAGNOSIS — I2699 Other pulmonary embolism without acute cor pulmonale: Secondary | ICD-10-CM

## 2011-10-10 DIAGNOSIS — R000 Tachycardia, unspecified: Secondary | ICD-10-CM

## 2011-10-10 DIAGNOSIS — F209 Schizophrenia, unspecified: Secondary | ICD-10-CM

## 2011-10-10 DIAGNOSIS — D72829 Elevated white blood cell count, unspecified: Secondary | ICD-10-CM

## 2011-10-10 DIAGNOSIS — Z8542 Personal history of malignant neoplasm of other parts of uterus: Secondary | ICD-10-CM

## 2011-10-10 DIAGNOSIS — E876 Hypokalemia: Secondary | ICD-10-CM

## 2011-10-10 DIAGNOSIS — Z7901 Long term (current) use of anticoagulants: Secondary | ICD-10-CM

## 2011-10-10 DIAGNOSIS — I498 Other specified cardiac arrhythmias: Secondary | ICD-10-CM | POA: Diagnosis present

## 2011-10-10 DIAGNOSIS — F411 Generalized anxiety disorder: Secondary | ICD-10-CM | POA: Diagnosis present

## 2011-10-10 DIAGNOSIS — M549 Dorsalgia, unspecified: Secondary | ICD-10-CM | POA: Diagnosis present

## 2011-10-10 DIAGNOSIS — F4310 Post-traumatic stress disorder, unspecified: Secondary | ICD-10-CM | POA: Diagnosis present

## 2011-10-10 DIAGNOSIS — I7300 Raynaud's syndrome without gangrene: Secondary | ICD-10-CM

## 2011-10-10 DIAGNOSIS — F200 Paranoid schizophrenia: Principal | ICD-10-CM | POA: Diagnosis present

## 2011-10-10 DIAGNOSIS — Z86711 Personal history of pulmonary embolism: Secondary | ICD-10-CM

## 2011-10-10 DIAGNOSIS — Z79899 Other long term (current) drug therapy: Secondary | ICD-10-CM

## 2011-10-10 DIAGNOSIS — F17200 Nicotine dependence, unspecified, uncomplicated: Secondary | ICD-10-CM | POA: Diagnosis present

## 2011-10-10 HISTORY — DX: Malignant (primary) neoplasm, unspecified: C80.1

## 2011-10-10 HISTORY — DX: Malignant neoplasm of uterus, part unspecified: C55

## 2011-10-10 HISTORY — DX: Anxiety disorder, unspecified: F41.9

## 2011-10-10 HISTORY — DX: Major depressive disorder, single episode, unspecified: F32.9

## 2011-10-10 HISTORY — DX: Depression, unspecified: F32.A

## 2011-10-10 LAB — POTASSIUM: Potassium: 3.5 mEq/L (ref 3.5–5.1)

## 2011-10-10 LAB — PROTIME-INR
INR: 5.02 (ref 0.00–1.49)
Prothrombin Time: 55.3 seconds — ABNORMAL HIGH (ref 11.6–15.2)

## 2011-10-10 LAB — APTT: aPTT: 75 seconds — ABNORMAL HIGH (ref 24–37)

## 2011-10-10 MED ORDER — HYDROCODONE-ACETAMINOPHEN 5-325 MG PO TABS
1.0000 | ORAL_TABLET | ORAL | Status: DC
  Administered 2011-10-10: 1 via ORAL
  Filled 2011-10-10: qty 1

## 2011-10-10 MED ORDER — HYDROCODONE-ACETAMINOPHEN 5-325 MG PO TABS
1.0000 | ORAL_TABLET | ORAL | Status: DC
  Administered 2011-10-10 – 2011-10-14 (×12): 1 via ORAL
  Filled 2011-10-10 (×12): qty 1

## 2011-10-10 MED ORDER — MAGNESIUM HYDROXIDE 400 MG/5ML PO SUSP: 30.0000 mL | Freq: Every day | ORAL | Status: DC | PRN

## 2011-10-10 MED ORDER — WARFARIN - PHARMACIST DOSING INPATIENT
Freq: Every day | Status: DC
  Administered 2011-10-12: 17:00:00
  Filled 2011-10-10 (×11): qty 1

## 2011-10-10 MED ORDER — DIAZEPAM 5 MG PO TABS
5.0000 mg | ORAL_TABLET | ORAL | Status: DC
  Administered 2011-10-10 – 2011-10-18 (×24): 5 mg via ORAL
  Filled 2011-10-10 (×30): qty 1

## 2011-10-10 MED ORDER — RISPERIDONE 2 MG PO TABS
4.0000 mg | ORAL_TABLET | Freq: Every day | ORAL | Status: DC
  Administered 2011-10-10 – 2011-10-17 (×8): 4 mg via ORAL
  Filled 2011-10-10 (×3): qty 2
  Filled 2011-10-10: qty 28
  Filled 2011-10-10 (×8): qty 2
  Filled 2011-10-10: qty 28

## 2011-10-10 MED ORDER — POTASSIUM CHLORIDE ER 10 MEQ PO TBCR
10.0000 meq | EXTENDED_RELEASE_TABLET | ORAL | Status: AC
  Administered 2011-10-10 – 2011-10-13 (×6): 10 meq via ORAL
  Filled 2011-10-10 (×8): qty 1

## 2011-10-10 MED ORDER — DIAZEPAM 5 MG PO TABS
5.0000 mg | ORAL_TABLET | ORAL | Status: DC
  Administered 2011-10-10: 5 mg via ORAL
  Filled 2011-10-10: qty 1

## 2011-10-10 MED ORDER — PHYTONADIONE 5 MG PO TABS
5.0000 mg | ORAL_TABLET | Freq: Once | ORAL | Status: AC
  Administered 2011-10-10: 5 mg via ORAL
  Filled 2011-10-10: qty 1

## 2011-10-10 MED ORDER — ALUM & MAG HYDROXIDE-SIMETH 200-200-20 MG/5ML PO SUSP: 30.0000 mL | ORAL | Status: DC | PRN

## 2011-10-10 MED ORDER — VITAMIN K1 10 MG/ML IJ SOLN
1.0000 mg | Freq: Once | INTRAVENOUS | Status: DC
  Filled 2011-10-10: qty 0.1

## 2011-10-10 NOTE — Tx Team (Signed)
Initial Interdisciplinary Treatment Plan  PATIENT STRENGTHS: (choose at least two) Ability for insight Average or above average intelligence Capable of independent living Communication skills Supportive family/friends  PATIENT STRESSORS: Financial difficulties Marital or family conflict   PROBLEM LIST: Problem List/Patient Goals Date to be addressed Date deferred Reason deferred Estimated date of resolution  psychosis 7/10     depression 7/10                                                DISCHARGE CRITERIA:  Adequate post-discharge living arrangements Improved stabilization in mood, thinking, and/or behavior Reduction of life-threatening or endangering symptoms to within safe limits  PRELIMINARY DISCHARGE PLAN: Outpatient therapy Return to previous living arrangement  PATIENT/FAMIILY INVOLVEMENT: This treatment plan has been presented to and reviewed with the patient, [name], and/or family member, .  The patient and family have been given the opportunity to ask questions and make suggestions.  [name] [facility] 10/10/2011, 2:19 PM

## 2011-10-10 NOTE — Progress Notes (Addendum)
ANTICOAGULATION CONSULT NOTE - Initial Consult  Pharmacy Consult for coumadin Indication: pulmonary embolus  Allergies  Allergen Reactions  . Darvocet (Propoxyphene-Acetaminophen)     Unknown     Patient Measurements: Height: 5\' 6"  (167.6 cm) Weight: 173 lb (78.472 kg) IBW/kg (Calculated) : 59.3    Vital Signs: Temp: 98.8 F (37.1 C) (07/10 1320) Temp src: Oral (07/10 1320) BP: 113/78 mmHg (07/10 1320) Pulse Rate: 111  (07/10 1320)  Labs:  Basename 10/10/11 0834 10/10/11 0200 10/09/11 1630 10/09/11 1610  HGB -- -- -- 12.2  HCT -- -- -- 35.3*  PLT -- -- -- 534*  APTT 75* -- -- --  LABPROT 47.3* 55.3* 46.6* --  INR 5.02* 6.14* 4.93* --  HEPARINUNFRC -- -- -- --  CREATININE -- -- -- 0.92  CKTOTAL -- -- -- --  CKMB -- -- -- --  TROPONINI -- -- -- --    Estimated Creatinine Clearance: 84.3 ml/min (by C-G formula based on Cr of 0.92).   Medical History: Past Medical History  Diagnosis Date  . Pulmonary emboli   . Schizophrenia     Medications:  Prescriptions prior to admission  Medication Sig Dispense Refill  . diazepam (VALIUM) 5 MG tablet Take 1 tablet (5 mg total) by mouth 3 (three) times daily at 8am, 2pm and bedtime. For anxiety/panic/  90 tablet  0  . HYDROcodone-acetaminophen (NORCO) 5-325 MG per tablet Take 1 tablet by mouth 3 (three) times daily at 8am, 2pm and bedtime. For pain  90 tablet  0  . nitrofurantoin, macrocrystal-monohydrate, (MACROBID) 100 MG capsule Take 1 capsule (100 mg total) by mouth 2 (two) times daily.  10 capsule  0  . potassium chloride (K-DUR) 10 MEQ tablet Take 1 tablet (10 mEq total) by mouth 2 (two) times daily.  20 tablet  0  . risperidone (RISPERDAL) 4 MG tablet Take 1 tablet (4 mg total) by mouth at bedtime. For psychosis  30 tablet  0  Coumadin home dose: 2.5 mg Tuesday/Thursday and 5 mg MWFSS.  Assessment: INR well above goal. No bleeding noted. Pt received PO vitamin K 5mg   x two doses. Will likely have delay in reaching  therapeutic goal.  If INR drops below 2 , will recommend started lovenox.  Resume coumadin when INR < 3.   Goal of Therapy:  INR 2-3    Plan:  No coumadin today. INR 5.02 on 7/10  INR in am.   [name] 10/10/2011,1:34 PM

## 2011-10-10 NOTE — Progress Notes (Signed)
ANTICOAGULATION CONSULT NOTE   Pharmacy Consult for Warfarin Indication: pulmonary embolus  Allergies  Allergen Reactions  . Darvocet (Propoxyphene-Acetaminophen)     Unknown     Patient Measurements:    Vital Signs: Temp: 98.5 F (36.9 C) (07/10 0701) Temp src: Oral (07/10 0701) BP: 97/62 mmHg (07/10 0701) Pulse Rate: 101  (07/10 0701)  Labs:  Basename 10/10/11 0200 10/09/11 1630 10/09/11 1610  HGB -- -- 12.2  HCT -- -- 35.3*  PLT -- -- 534*  APTT -- -- --  LABPROT 55.3* 46.6* --  INR 6.14* 4.93* --  HEPARINUNFRC -- -- --  CREATININE -- -- 0.92  CKTOTAL -- -- --  CKMB -- -- --  TROPONINI -- -- --    The CrCl is unknown because both a height and weight (above a minimum accepted value) are required for this calculation.   Medical History: Past Medical History  Diagnosis Date  . Pulmonary emboli   . Schizophrenia     Medications:  Scheduled:     . nicotine  21 mg Transdermal Daily  . phytonadione  5 mg Oral Once  . potassium chloride  10 mEq Oral BID  . potassium chloride  40 mEq Oral Once  . risperidone  4 mg Oral QHS  . Warfarin - Pharmacist Dosing Inpatient   Does not apply q1800  . DISCONTD: phytonadione (VITAMIN K) IV  1 mg Intravenous Once   Infusions:   PRN: ibuprofen, ondansetron  Assessment:  41 yo on warfarin PTA for PE and to resume while in hospital. Pt was taking 5mg  on MWFSatSun and 2.5mg  on TTH with last dose on 7/9.  INR on admission was supratherapeutic and has increased to >6;  Vitamin K 5mg  PO given 7/10 at 0400  CBC is ok  Goal of Therapy:  INR 2-3  Plan:   Continue to hold warfarin  Daily INR   Lynann Beaver PharmD, BCPS Pager [phone] 10/10/2011 7:26 AM

## 2011-10-10 NOTE — ED Provider Notes (Addendum)
0745 Patient sleeping on AM rounds.  Per RN, no overnight events or complaints.  Patient's INR continues to be elevated.  BH is aware of the patient, but she cannot be placed while supra therapeutic.  K provided last night, and repeat labs are pending.  BP 97/62  Pulse 101  Temp 98.5 F (36.9 C) (Oral)  Resp 20  SpO2 99%  LMP 10/09/2011   Gerhard Munch, MD 10/10/11 0854  12:19 PM The patient has been accepted by behavioral health, with a request of provision of one more dose of vitamin K prior to transfer.  Given the patient is not actively bleeding, her INR is decreasing, this is reasonable and was provided.  Gerhard Munch, MD 10/10/11 [phone]
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
## 2011-10-10 NOTE — ED Notes (Signed)
Critical value received by Kathlene November, RN of INR 5.02. Jeraldine Loots, EDP made aware. No new orders received at this time. Consulting with [facility] and [facility] to determine further plan of care regarding INR. Will inform EDP of [facility] requirements and requests.

## 2011-10-10 NOTE — Progress Notes (Addendum)
D: Patient pleasant and cooperative.  Patient appears blunted and depressed. Pt complains of chronic back pain. Patient in bed during assessment.  Patient requesting pain medications for back pain.  Patient rates depression and hopelessness 3/10.  Patient denies SI/HI and denies AVH at this time. Patient stated, "I'm tired I just want to go to bed."  Patient did not attend wrap-up group.   A: Staff to monitor Q 15 mins for safety.  Scheduled medications administered per MD orders.  Patient offered encouragement and support.    R:Patient remains safe on the unit.  Patient taking medication per MD orders.

## 2011-10-10 NOTE — ED Notes (Signed)
Pt requesting hydrocodone and valium home meds. Jeraldine Loots, EDP made aware, new orders received.

## 2011-10-11 DIAGNOSIS — F4310 Post-traumatic stress disorder, unspecified: Secondary | ICD-10-CM

## 2011-10-11 LAB — URINALYSIS, ROUTINE W REFLEX MICROSCOPIC
Glucose, UA: NEGATIVE mg/dL
Protein, ur: 100 mg/dL — AB

## 2011-10-11 LAB — TSH: TSH: 0.857 u[IU]/mL (ref 0.350–4.500)

## 2011-10-11 LAB — URINE MICROSCOPIC-ADD ON

## 2011-10-11 MED ORDER — NITROFURANTOIN MONOHYD MACRO 100 MG PO CAPS
100.0000 mg | ORAL_CAPSULE | Freq: Two times a day (BID) | ORAL | Status: AC
  Administered 2011-10-11 – 2011-10-18 (×14): 100 mg via ORAL
  Filled 2011-10-11 (×16): qty 1

## 2011-10-11 MED ORDER — ENOXAPARIN SODIUM 80 MG/0.8ML ~~LOC~~ SOLN
80.0000 mg | Freq: Two times a day (BID) | SUBCUTANEOUS | Status: DC
  Administered 2011-10-11 – 2011-10-14 (×6): 80 mg via SUBCUTANEOUS
  Filled 2011-10-11 (×8): qty 0.8

## 2011-10-11 MED ORDER — WARFARIN SODIUM 10 MG PO TABS
10.0000 mg | ORAL_TABLET | Freq: Once | ORAL | Status: AC
Start: 2011-10-11 — End: 2011-10-11
  Administered 2011-10-11: 10 mg via ORAL
  Filled 2011-10-11: qty 1

## 2011-10-11 MED ORDER — DIPHENHYDRAMINE HCL 50 MG PO CAPS
50.0000 mg | ORAL_CAPSULE | Freq: Four times a day (QID) | ORAL | Status: DC | PRN
  Administered 2011-10-11 – 2011-10-14 (×6): 50 mg via ORAL

## 2011-10-11 MED ORDER — NICOTINE 21 MG/24HR TD PT24
21.0000 mg | MEDICATED_PATCH | Freq: Every day | TRANSDERMAL | Status: DC
  Administered 2011-10-11 – 2011-10-18 (×8): 21 mg via TRANSDERMAL
  Filled 2011-10-11 (×11): qty 1

## 2011-10-11 NOTE — Progress Notes (Signed)
Patient ID: [name], female   DOB: [birth year], 41 y.o.   MRN: [id number] Patient will not give consent to talk to boyfriend.
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
## 2011-10-11 NOTE — Progress Notes (Addendum)
D: Patient calm and cooperative.  Patient had been preoccupied about coumadin therapy and labs.  Patient interacting more with peers tonight.  Patient rates depression and hopelessness 2/10 tonight.  Patient seems brighter and opens up during conversation with RN.  Patient denies SI/HI and denies AVH.  Patient's rash resolved from earlier in the day.  A: Encouragement and support offered by RN.  Staff to monitor Q 15 minutes for safety R: PT safety maintained on the unit. Patient did not attend karaoke group tonight.

## 2011-10-11 NOTE — Progress Notes (Signed)
[facility] Group Notes:  (Counselor/Nursing/MHT/Case Management/Adjunct)  10/11/2011 3:09 PM  Type of Therapy:  Group Therapy 9:30, Music Therapy 1:15  Participation Level:  Did Not Attend  Complaining of physical problems.   [name], Aram Beecham 10/11/2011, 3:09 PM

## 2011-10-11 NOTE — Progress Notes (Signed)
Psychoeducational Group Note  Date:  10/11/2011 Time:  2000  Group Topic/Focus:  Wrap-Up Group:   The focus of this group is to help patients review their daily goal of treatment and discuss progress on daily workbooks.  Participation Level:  Did Not Attend  Participation Quality:  Did not attend  Affect:  Did not attend   Cognitive:  Did not attend  Insight:  Did not attend  Engagement in Group:  Did not attend  Additional Comments:  Pt did not attend group this evening.  Kenley Rettinger R 10/11/2011, 1:19 AM

## 2011-10-11 NOTE — Tx Team (Signed)
Interdisciplinary Treatment Plan Update (Adult)  Date:  10/11/2011  Time Reviewed:  10:15AM-11:15AM  Progress in Treatment: Attending groups:  Yes, even though new attended group this morning Participating in groups:    Yes Taking medication as prescribed:    New patient Tolerating medication:   New patient Family/Significant other contact made:  Refuses contact with her "ex" boyfriend this time Patient understands diagnosis:   Yes, limited Discussing patient identified problems/goals with staff:   Yes, fully engaged Medical problems stabilized or resolved:   No, working on coumadin level Denies suicidal/homicidal ideation:  Denies Issues/concerns per patient self-inventory:   None Other:    New problem(s) identified: Yes, Describe:  would like to know if she can get one of the machines that measures her coumadin level at home  Reason for Continuation of Hospitalization: Anxiety Depression Medical Issues Medication stabilization  Interventions implemented related to continuation of hospitalization:  Medication monitoring and adjustment, safety checks Q15 min., suicide risk assessment, group therapy, psychoeducation, collateral contact, aftercare planning, ongoing physician assessments, medication education  Additional comments:  Not applicable  Estimated length of stay:  3-4 days  Discharge Plan:  Return to live with ex-boyfriend, follow up with primary care physician and [facility] Community Support Team  New goal(s):  Not applicable  Review of initial/current patient goals per problem list:   1.  Goal(s):  Medication stabilization  Met:  No  Target date:  By Discharge   As evidenced by:  Ongoing  2.  Goal(s):  Return sleep to normal pattern of 6+ hours nightly.  Met:  No  Target date:  By Discharge   As evidenced by:  6.75 hours last night  3.  Goal(s):  Reduce delusions to baseline per patient report and collateral.  Met:  No  Target date:  By Discharge   As  evidenced by:  Denies delusions, needs longer to determine that this is resolved  4.  Goal(s):  Agree to follow-up at higher level due to repeated hospitalizations.  Met:  No  Target date:  By Discharge   As evidenced by:  Still working on convincing her of this.  Attendees: Patient:  [name]  10/11/2011 10:15AM-11:15AM  Family:     Physician:  Dr. Harvie Heck Readling 10/11/2011 10:15AM-11:15AM  Nursing:   Joslyn Devon, RN 10/11/2011 10:15AM -11:15AM   Case Manager:  Ambrose Mantle, LCSW 10/11/2011 10:15AM-11:15AM  Counselor:  Veto Kemps, MT-BC 10/11/2011 10:15AM-11:15AM  Other:   Nestor Ramp, RN 10/11/2011   Other:      Other:      Other:       Scribe for Treatment Team:   Sarina Ser, 10/11/2011, 10:15AM-11:15AM

## 2011-10-11 NOTE — Progress Notes (Signed)
Pt notified RN of redness/rash on upper chest, face and back; RN notified MD; orders were given for benadryl; will continue to monitor pt

## 2011-10-11 NOTE — Progress Notes (Signed)
Adult Psychosocial Assessment Update Interdisciplinary Team  Previous [facility] admissions/discharges:  Admissions Discharges  Date:  6/14 Date: 09/27/11  Date: Date:  Date: Date:  Date: Date:  Date: Date:   Changes since the last Psychosocial Assessment (including adherence to outpatient mental health and/or substance abuse treatment, situational issues contributing to decompensation and/or relapse). Patient stated that her potassium had dropped and her coumadin level had dropped. Patient described herself as "loopy". She stated that she had taken her medications and gone to appointments. Couldn't remember a lot of what had happened. Patient had dismissed her community support team saying that she thought she was doing better.             Discharge Plan 1. Will you be returning to the same living situation after discharge?   Yes:x No:      If no, what is your plan?    Only temporarily. Patient doesn't think boyfriend of 9 years understands her illness       2. Would you like a referral for services when you are discharged? Yes:     If yes, for what services?  No:   x    Sees Kimberly at [facility] for medical problems and they will also prescribe her psychiatric medications.       Summary and Recommendations (to be completed by the evaluator) Patient is a 41 year old white female with diagnosis of Schizophrenia, paranoid and PTSD. Patient was readmitted due to psychosis. She was not sleeping and believed that she was being attacked by psychics. Patient will benefit from crisis stabilization, medication evaluation, group therapy and psycho-education groups to work on coping skills, and case management for referrals.                       Signature:  Veto Kemps, 10/11/2011 3:01 PM

## 2011-10-11 NOTE — Progress Notes (Signed)
D: Pt denies SI; pt states her depression and hopelessness a 3 out of 10 (1 low/10 high); pt reports sleeping well and having a good appetite; pt does complain of low energy; pt does have UTI (blood and bacteria in urine)  A: Pt given emotional support from RN; pt encouraged to come to staff with concerns and/or questions; pt's medication routine continued; pt's orders and plan of care reviewed; RN notified MD of pt's lab results R: Pt remains appropriate and cooperative; will continue to monitor

## 2011-10-11 NOTE — Discharge Planning (Signed)
Met with patient in Aftercare Planning Group and provided today's workbook based on theme of the day. Patient stated that she will be going back to live with the person she is now calling her "ex" boyfriend.  She has been dating him and living with him for more than 9 years, but now she believes that he is cheating on her, and he is also making it difficult for her to attend medical appointments by telling her he will take her, then reneging at the last minute.  As soon as she is able, she would like to find her own apartment.  Case Manager will provide her a list of apartments in [place].  Patient reported that when she recently discharged from [facility], she did not go to the [facility] appointment because her Primary Care Physician said she would write the psychiatric meds for her.  She did see the [employer] from [facility] at her home several times, but then decided she did not need them.  Case Manager told her the reasons we had recommended that service to her, and reiterated that now that she has been back at the hospital, it will be that much more important for her to comply with staying with the [facility] for at least 6 months.  Case Manager has asked for permission to have the team members to come visit patient while she is in the hospital.  She is thinking about this.  No case management needs today.  Ambrose Mantle, LCSW 10/11/2011, 4:31 PM

## 2011-10-12 LAB — PROTIME-INR: Prothrombin Time: 15.1 seconds (ref 11.6–15.2)

## 2011-10-12 MED ORDER — WARFARIN SODIUM 2.5 MG PO TABS
12.5000 mg | ORAL_TABLET | Freq: Once | ORAL | Status: AC
  Administered 2011-10-12: 12.5 mg via ORAL
  Filled 2011-10-12: qty 1

## 2011-10-12 NOTE — Discharge Planning (Signed)
Patient did not come to Aftercare Planning Group, but was given her workbook for today in her room.  She stated she has no case management needs today.   Utilization review done for additional days.  Ambrose Mantle, LCSW 10/12/2011, 12:38 PM

## 2011-10-12 NOTE — Progress Notes (Signed)
[facility] Group Notes:  (Counselor/Nursing/MHT/Case Management/Adjunct)  10/12/2011 12:24 PM  Type of Therapy:  Group Therapy  Participation Level:  Minimal  Participation Quality:  Attentive  Affect:  Depressed  Cognitive:  Oriented  Insight:  Limited  Engagement in Group:  Limited  Engagement in Therapy:  Limited  Modes of Intervention:  Education, Problem-solving and Support  Summary of Progress/Problems: Patient was attentive to discussion on relapse prevention but did not participate verbally.   HartisAram Beecham 10/12/2011, 12:24 PM

## 2011-10-12 NOTE — Progress Notes (Signed)
ANTICOAGULATION CONSULT NOTE - Follow Up Consult  Pharmacy Consult for Coumadin  Indication: h/o PE  Allergies  Allergen Reactions  . Darvocet (Propoxyphene-Acetaminophen)     Unknown     Patient Measurements: Height: 5\' 6"  (167.6 cm) Weight: 173 lb (78.472 kg) IBW/kg (Calculated) : 59.3    Vital Signs: Temp: 98.6 F (37 C) (07/12 0600) BP: 113/74 mmHg (07/12 0601) Pulse Rate: 108  (07/12 0601)  Labs:  Alvira Philips 10/12/11 1610 10/11/11 0610 10/10/11 0834 10/09/11 1610  HGB -- -- -- 12.2  HCT -- -- -- 35.3*  PLT -- -- -- 534*  APTT -- -- 75* --  LABPROT 15.1 17.0* 47.3* --  INR 1.17 1.36 5.02* --  HEPARINUNFRC -- -- -- --  CREATININE -- -- -- 0.92  CKTOTAL -- -- -- --  CKMB -- -- -- --  TROPONINI -- -- -- --    Estimated Creatinine Clearance: 84.3 ml/min (by C-G formula based on Cr of 0.92).   Medications:  Scheduled:    . diazepam  5 mg Oral [id number]  . enoxaparin (LOVENOX) injection  80 mg Subcutaneous BID  . HYDROcodone-acetaminophen  1 tablet Oral BH-q8a2phs  . nicotine  21 mg Transdermal Daily  . nitrofurantoin (macrocrystal-monohydrate)  100 mg Oral BID WC  . potassium chloride  10 mEq Oral BH-qamhs  . risperidone  4 mg Oral QHS  . warfarin  10 mg Oral ONCE-1800  . Warfarin - Pharmacist Dosing Inpatient   Does not apply q1800    Assessment: Pts INR dropped to 1.17 today.  Patient received 5 mg x 2 doses in ER prior to transfer to [facility].  No problems noted with therapy  Goal of Therapy:  INR 2-3    Plan:  Coumadin 12.5 mg PO x 1 today at 1800 PT/INR in am  F/u in am   [name] 10/12/2011,11:14 AM

## 2011-10-12 NOTE — Progress Notes (Addendum)
Patient ID: [name], female   DOB: 1970-10-31, 41 y.o.   MRN: [id number] [facility] MD Progress Note  10/12/2011 4:42 PM  Diagnosis:  Axis I: schizophrenia, paranoid type  ADL's:  Intact  Sleep:  Yes,  AEB:  Appetite: "real well" Suicidal Ideation:  No                Plan No                Intent No                     Means No         Homicidal Ideation:   No  Plan:  No  Intent:  No  Means:  No   Subjective:Met with patient and discussed her symptoms.  The patient states she is eating and sleeping well. She reports minimal depression as a 2, and her anxiety is a 3, with 2 for her feelings of hopeless ness.      BP 114/77  Pulse 102  Temp 98.6 F (37 C) (Oral)  Resp 16  Ht 5\' 6"  (1.676 m)  Wt 78.472 kg (173 lb)  BMI 27.92 kg/m2  LMP 10/09/2011 Objective: [name] reports concern about her elevated PT/INR, as well as her boyfriend.  She feels that she has to be a doctor to monitor her PT/INR. [name] also feels that she needs to check up on her boyfriend as she is worried about him. Her speech is clear and goal oriented.  She is still somewhat guarded with her behaviors.   Mental Status: General Appearance  Behavior:  Disheveled Eye Contact:  Fair Motor Behavior:  Normal Speech:  Normal Level of Consciousness:  Alert Mood:  Anxious Affect:  guarded Anxiety Level:  Minimal Thought Process:  Paranoid ideations continue Thought Content:  Paranoid Ideation Perception:  Normal Judgment:  Poor Insight:  Absent Cognition:  Orientation time, place and person Sleep:  Number of Hours: 6   Vital Signs:Blood pressure 114/77, pulse 102, temperature 98.6 F (37 C), temperature source Oral, resp. rate 16, height 5\' 6"  (1.676 m), weight 78.472 kg (173 lb),   Lab Results:  Results for orders placed during the hospital encounter of 10/10/11 (from the past 48 hour(s))  TSH     Status: Normal   Collection Time   10/10/11  7:33 PM      Component Value Range Comment   TSH 0.857   0.350 - 4.500 uIU/mL   T3, FREE     Status: Normal   Collection Time   10/10/11  7:33 PM      Component Value Range Comment   T3, Free 3.1  2.3 - 4.2 pg/mL   T4, FREE     Status: Normal   Collection Time   10/10/11  7:33 PM      Component Value Range Comment   Free T4 1.23  0.80 - 1.80 ng/dL   PROTIME-INR     Status: Abnormal   Collection Time   10/11/11  6:10 AM      Component Value Range Comment   Prothrombin Time 17.0 (*) 11.6 - 15.2 seconds    INR 1.36  0.00 - 1.49   URINALYSIS, ROUTINE W REFLEX MICROSCOPIC     Status: Abnormal   Collection Time   10/11/11  6:13 AM      Component Value Range Comment   Color, Urine RED (*) YELLOW BIOCHEMICALS MAY BE AFFECTED BY COLOR   APPearance TURBID (*)  CLEAR    Specific Gravity, Urine 1.018  1.005 - 1.030    pH 5.5  5.0 - 8.0    Glucose, UA NEGATIVE  NEGATIVE mg/dL    Hgb urine dipstick LARGE (*) NEGATIVE    Bilirubin Urine SMALL (*) NEGATIVE    Ketones, ur TRACE (*) NEGATIVE mg/dL    Protein, ur 161 (*) NEGATIVE mg/dL    Urobilinogen, UA 1.0  0.0 - 1.0 mg/dL    Nitrite NEGATIVE  NEGATIVE    Leukocytes, UA MODERATE (*) NEGATIVE   URINE MICROSCOPIC-ADD ON     Status: Abnormal   Collection Time   10/11/11  6:13 AM      Component Value Range Comment   Squamous Epithelial / LPF MANY (*) RARE    WBC, UA 7-10  <3 WBC/hpf    RBC / HPF TOO NUMEROUS TO COUNT  <3 RBC/hpf    Bacteria, UA MANY (*) RARE   PROTIME-INR     Status: Normal   Collection Time   10/12/11  6:16 AM      Component Value Range Comment   Prothrombin Time 15.1  11.6 - 15.2 seconds    INR 1.17  0.00 - 1.49     Physical Findings: AIMS: Facial and Oral Movements Muscles of Facial Expression: None, normal Lips and Perioral Area: None, normal Jaw: None, normal Tongue: None, normal,Extremity Movements Upper (arms, wrists, hands, fingers): None, normal Lower (legs, knees, ankles, toes): None, normal, Trunk Movements Neck, shoulders, hips: None, normal, Overall  Severity Severity of abnormal movements (highest score from questions above): None, normal Incapacitation due to abnormal movements: None, normal Patient's awareness of abnormal movements (rate only patient's report): No Awareness, Dental Status Current problems with teeth and/or dentures?: Yes Does patient usually wear dentures?: Yes  CIWA:    COWS:     Treatment Plan Summary: Daily contact with patient to assess and evaluate symptoms and progress in treatment Medication management  Plan: 1. Reviewed labs, +hematuria, patient is currently on antibiotics. 2. Will continue current plan of care. 3. Will follow labs.  Rona Ravens. Chayna Surratt PAC 10/12/2011, 4:42 PM

## 2011-10-12 NOTE — Progress Notes (Signed)
Psychoeducational Group Note  Date:  10/12/2011 Time:  2000  Group Topic/Focus:  Karaoke  Participation Level:  Minimal  Participation Quality:  Drowsy  Affect:  Flat  Cognitive:  Alert  Insight:  Limited  Engagement in Group:  Limited  Additional Comments:  Pt did attend the first part of karaoke, but returned back to the unit early with a c/o being dizzy.  Kaleen Odea R 10/12/2011, 3:59 AM

## 2011-10-12 NOTE — Progress Notes (Signed)
D: Pt denies SI; pt has a depressed mood and affect with a flat facial expression; pt rates her depression and hopelessness a 2 out of 10 (1 low/10 high); pt reports sleeping well and having a good appetite but she feels that her energy level is low; pt is being more interactive in the milieu but states that she feels "tired every so often"; pt does have chronic back pain and states that her pain goal for today is 2 and her current pain level is a 7 but she is receiving scheduled pain medications for this issue A: Pt given emotional support from RN; pt encouraged to come to staff with concerns and/or questions; pt's medication routine continued; pt's orders and plan of care reviewed R: Pt remains appropriate and cooperative; will continue to monitor

## 2011-10-12 NOTE — Progress Notes (Signed)
10/12/2011         Time: 0930      Group Topic/Focus: The focus of the group is on enhancing the patients' ability to cope with stressors by understanding what coping is, why it is important, the negative effects of stress and developing healthier coping skills. Patients practice [place] and discuss how exercise can be used as a healthy coping strategy.   Participation Level: None  Participation Quality: Attentive  Affect: Depressed  Cognitive: Oriented   Additional Comments: Patient reports not feeling well, but said she came to group because she is afraid that her nurse is mad at her. Patient observed but didn't participate.  Gioia Ranes 10/12/2011 11:56 AM

## 2011-10-13 LAB — PROTIME-INR
INR: 1.42 (ref 0.00–1.49)
Prothrombin Time: 17.6 seconds — ABNORMAL HIGH (ref 11.6–15.2)

## 2011-10-13 MED ORDER — WARFARIN SODIUM 7.5 MG PO TABS
7.5000 mg | ORAL_TABLET | Freq: Once | ORAL | Status: AC
  Administered 2011-10-13: 7.5 mg via ORAL
  Filled 2011-10-13: qty 1

## 2011-10-13 NOTE — Progress Notes (Signed)
ANTICOAGULATION CONSULT NOTE - Follow Up Consult  Pharmacy Consult for Coumadin/Lovenox Indication: H/O PE  Allergies  Allergen Reactions  . Darvocet (Propoxyphene-Acetaminophen)     Unknown     Patient Measurements: Height: 5\' 6"  (167.6 cm) Weight: 173 lb (78.472 kg) IBW/kg (Calculated) : 59.3    Labs:  Basename 10/13/11 0705 10/12/11 0616 10/11/11 0610  HGB -- -- --  HCT -- -- --  PLT -- -- --  APTT -- -- --  LABPROT 17.6* 15.1 17.0*  INR 1.42 1.17 1.36  HEPARINUNFRC -- -- --  CREATININE -- -- --  CKTOTAL -- -- --  CKMB -- -- --  TROPONINI -- -- --    Estimated Creatinine Clearance: 84.3 ml/min (by C-G formula based on Cr of 0.92).   Medications:  Scheduled:    . diazepam  5 mg Oral BH-q8a2phs  . enoxaparin (LOVENOX) injection  80 mg Subcutaneous BID  . HYDROcodone-acetaminophen  1 tablet Oral BH-q8a2phs  . nicotine  21 mg Transdermal Daily  . nitrofurantoin (macrocrystal-monohydrate)  100 mg Oral BID WC  . potassium chloride  10 mEq Oral BH-qamhs  . risperidone  4 mg Oral QHS  . warfarin  12.5 mg Oral ONCE-1800  . warfarin  7.5 mg Oral ONCE-1800  . Warfarin - Pharmacist Dosing Inpatient   Does not apply q1800    Assessment: Inr 1.4 today.  Increasing.  Hematuria noted, patient receiving Antibiotics for UTI.  Patient very concerned about INR.  Patient on lovenox until INR >2.0  Goal of Therapy:  INR 2-3    Plan:  Coumadin 7.5 mg x 1 today PT/INR in am  F/U in am   [name] 10/13/2011,9:38 AM

## 2011-10-13 NOTE — Progress Notes (Signed)
Pt has been up and has been active and has been participating in various milieu activities throughout the day today, did endorse some feelings of depression but states she is feeling better, eating and sleeping ok, pt was seen talking to herself during the day today at intervals, pt also pacing the halls a lot today but stated that she does that to help with her back pain, pt has received medications today without incident, support and encouragement provided, will continue to monitor

## 2011-10-13 NOTE — Progress Notes (Signed)
Patient ID: [name], female   DOB: 01/04/1971, 41 y.o.   MRN: [id number] D.The patient has a flat affect and depressed mood. Pacing in [place].  A. Q 15 minute check maintained for safety. Encouraged to attend evening wrap up group. Administered scheduled medication. Assessed for suicide risk and auditory hallucinations. R. The patient attended but did not actively participate in group. Denied any suicidal ideation. Denied any auditory hallucinations. Stated she worrys about health and medications.

## 2011-10-13 NOTE — Progress Notes (Signed)
Psychoeducational Group Note  Date:  10/13/2011 Time:  2000  Group Topic/Focus:  Wrap-Up Group:   The focus of this group is to help patients review their daily goal of treatment and discuss progress on daily workbooks.  Participation Level:  Minimal  Participation Quality:  Appropriate  Affect:  Appropriate  Cognitive:  Appropriate  Insight:  Limited  Engagement in Group:  Limited  Additional Comments:  Pt didn't share much in group.   Lawayne Hartig A 10/13/2011, 2:12 AM

## 2011-10-13 NOTE — Progress Notes (Signed)
Patient ID: [name], female   DOB: 14-Jan-1971, 41 y.o.   MRN: [id number] [facility] MD Progress Note  10/13/2011 7:25 PM  Diagnosis:  Axis I: Schizophrenia, paranoid type  ADL's:  Intact  Sleep:  Yes,  AEB:  Appetite: "good" Suicidal Ideation:  Pt. Denies SI, states no plan, no intent,no means                Plan                 Intent                      Means          Homicidal Ideation:   No, patient states no plan, no intent, no means  Plan:    Intent:  No  Means:  No  Subjective:  The patient is seen and the chart is reviewed.  She is up pacing in the halls and states she has exercised and it has helped her back pain.  She inquires about her labs and her PT/INR is still her main focus. Margreat still feels that she has to be "doctor" to keep up with her medical problems and to keep all of her medication straight. BP 134/85  Pulse 112  Temp 98.3 F (36.8 C) (Oral)  Resp 16  Ht 5\' 6"  (1.676 m)  Wt 78.472 kg (173 lb)  BMI 27.92 kg/m2  LMP 10/09/2011 Objective:  She is somewhat guarded today, minimal eye contact. Speech is clear and goal oriented. Rate, rhythm, volume is normal. She denies SI/HI, reports no AH/VH, but her behaviors as noted in nursing notes indicate she has been responding to internal stimulation. Mental Status: General Appearance  Behavior:  Disheveled Eye Contact:  Minimal Motor Behavior:  Restlestness Speech:  Normal Level of Consciousness:  Alert Mood:  Anxious Affect:  Appropriate Anxiety Level:  Minimal Thought Process:  Coherent Thought Content:  WNL Perception:  Normal Judgment:  Poor Insight:  Absent Cognition:  Orientation time Sleep:  Number of Hours: 3.25   Vital Signs:Blood pressure 134/85, pulse 112, temperature 98.3 F (36.8 C), temperature source Oral, resp. rate 16, height 5\' 6"  (1.676 m), weight 78.472 kg (173 lb), last menstrual period 10/09/2011.  Lab Results:  Results for orders placed during the hospital encounter of 10/10/11  (from the past 48 hour(s))  PROTIME-INR     Status: Normal   Collection Time   10/12/11  6:16 AM      Component Value Range Comment   Prothrombin Time 15.1  11.6 - 15.2 seconds    INR 1.17  0.00 - 1.49   PROTIME-INR     Status: Abnormal   Collection Time   10/13/11  7:05 AM      Component Value Range Comment   Prothrombin Time 17.6 (*) 11.6 - 15.2 seconds    INR 1.42  0.00 - 1.49     Physical Findings: AIMS: Facial and Oral Movements Muscles of Facial Expression: None, normal Lips and Perioral Area: None, normal Jaw: None, normal Tongue: None, normal,Extremity Movements Upper (arms, wrists, hands, fingers): None, normal Lower (legs, knees, ankles, toes): None, normal, Trunk Movements Neck, shoulders, hips: None, normal, Overall Severity Severity of abnormal movements (highest score from questions above): None, normal Incapacitation due to abnormal movements: None, normal Patient's awareness of abnormal movements (rate only patient's report): No Awareness, Dental Status Current problems with teeth and/or dentures?: Yes Does patient usually wear dentures?: Yes  CIWA:  COWS:     Treatment Plan Summary: 1. Daily contact with patient to assess and evaluate symptoms and progress in treatment.  2. Medication management  3. The patient will deny suicidal ideations or homicidal ideations for 48 hours prior to discharge and have a depression and anxiety rating of 3 or less. The patient will also deny any auditory or visual hallucinations or delusional thinking.  4. The patient will deny any symptoms of substance withdrawal at time of discharge.  Plan: 1. Continue current plan of care. 2. Consider possible assisted living facility if patient can qualify. Chrishon Martino 10/13/2011, 7:25 PM

## 2011-10-13 NOTE — Progress Notes (Signed)
Psychoeducational Group Note  Date:  10/13/2011 Time:  0945 am  Group Topic/Focus:  Identifying Needs:   The focus of this group is to help patients identify their personal needs that have been historically problematic and identify healthy behaviors to address their needs.  Participation Level:  Active  Participation Quality:  Appropriate  Affect:  Blunted  Cognitive:  Hallucinating  Insight:  Good  Engagement in Group:  Good  Additional Comments:  Pt did participate but during the group when other were talking she was talking to herself  [name] 10/13/2011,11:03 AM

## 2011-10-13 NOTE — Progress Notes (Signed)
Patient ID: [name], female   DOB: [birth year], 41 y.o.   MRN: [id number]   [facility] Group Notes:  (Counselor/Nursing/MHT/Case Management/Adjunct)  10/13/2011 11 AM  Type of Therapy:  Aftercare Planning, Group Therapy, Dance/Movement Therapy   Participation Level:  Active  Participation Quality:  Appropriate  Affect:  Appropriate  Cognitive:  Appropriate  Insight:  Good  Engagement in Group:  Good  Engagement in Therapy:  Good  Modes of Intervention:  Clarification, Problem-solving, Role-play, Socialization and Support  Summary of Progress/Problems: After Care: Pt. attended and participated in aftercare planning group. Pt. verbally accepted information on suicide prevention, warning signs to look for with suicide and crisis line numbers to use. Pt. shared that she was feeling good today.  Counseling:  Therapist and group members discussed things to do to turn bad thoughts and negative behaviors into something that is healthy. Pt. shared that she prays when she has bad thoughts.    [name] 10/13/2011. 11:31 AM

## 2011-10-14 MED ORDER — HYDROCODONE-ACETAMINOPHEN 5-325 MG PO TABS
1.5000 | ORAL_TABLET | Freq: Four times a day (QID) | ORAL | Status: AC | PRN
  Administered 2011-10-14: 1.5 via ORAL
  Filled 2011-10-14 (×2): qty 2

## 2011-10-14 MED ORDER — HYDROCODONE-ACETAMINOPHEN 5-325 MG PO TABS
1.0000 | ORAL_TABLET | Freq: Once | ORAL | Status: DC
  Filled 2011-10-14: qty 1

## 2011-10-14 MED ORDER — HYDROCODONE-ACETAMINOPHEN 7.5-325 MG PO TABS: 1.0000 | ORAL_TABLET | Freq: Four times a day (QID) | ORAL | Status: DC | PRN

## 2011-10-14 MED ORDER — TRAZODONE HCL 50 MG PO TABS
50.0000 mg | ORAL_TABLET | ORAL | Status: AC
  Filled 2011-10-14 (×2): qty 1

## 2011-10-14 MED ORDER — WARFARIN SODIUM 2.5 MG PO TABS
2.5000 mg | ORAL_TABLET | Freq: Once | ORAL | Status: AC
  Administered 2011-10-14: 2.5 mg via ORAL
  Filled 2011-10-14: qty 1

## 2011-10-14 NOTE — Progress Notes (Signed)
Psychoeducational Group Note  Date:  10/14/2011 Time:  0945 am  Group Topic/Focus:  Making Healthy Choices:   The focus of this group is to help patients identify negative/unhealthy choices they were using prior to admission and identify positive/healthier coping strategies to replace them upon discharge.  Participation Level:  None  Participation Quality:  Inattentive  Affect:  Labile  Cognitive:  Hallucinating  Insight:  Limited  Engagement in Group:  None  Additional Comments:  Pt was present for the first few minutes of group but was responding to internal stimuli talking to herself   She seemed overwhelmed and got up and walked out and spent the rest of group pacing the [place]  [name] 10/14/2011, 10:29 AM

## 2011-10-14 NOTE — Progress Notes (Signed)
Patient ID: [name], female   DOB: 17-Mar-1971, 41 y.o.   MRN: [id number] D. The patient has a blunted affect and observed pacing the hallway all evening. Stated that the pacing helps her chronic back pain and hoping to tire herself out so that she can sleep tonight. Slept poorly last night. A. Offered non-pharmaceutical pain relief alternatives. Administered scheduled medication. Engaged in 1:1 to assess suicidal/homicidal ideation. R. The patient refused all offers of alternative pain relievers. Denied any suicidal/homicidal ideation. Denied any A/V hallucinations. Said she would be ready for discharge after her medical problems were all straightened out. Stated she feels this whole hospitalization was caused by her low K+ level.

## 2011-10-14 NOTE — Progress Notes (Signed)
Pt has been up and has been visible throughout the day today, pt has attempted to participate in various activities and has had a difficult time remaining focused on the activity and stated that she is having pain and unable to participate in long periods of time in the group setting, but has stated that she did not sleep well because of the pain, pt has stated that she is feeling better in regards to her depression and feels close to being ready to go home, pt has received all medications today without incident, support provided, will continue to monitor

## 2011-10-14 NOTE — Progress Notes (Signed)
Patient ID: [name], female   DOB: 1971-04-02, 41 y.o.   MRN: [id number]  [facility] Group Notes:  (Counselor/Nursing/MHT/Case Management/Adjunct)  10/14/2011 11 AM  Type of Therapy:  Aftercare Planning, Group Therapy, Dance/Movement Therapy   Participation Level:  Minimal  Participation Quality:  Attentive and Drowsy  Affect:  Flat  Cognitive:  Appropriate  Insight:  Limited  Engagement in Group:  Limited  Engagement in Therapy:  Limited  Modes of Intervention:  Clarification, Problem-solving, Role-play, Socialization and Support  Summary of Progress/Problems: After Care: Pt. attended and participated in aftercare planning group. Pt. accepted information on suicide prevention, warning signs to look for with suicide and crisis line numbers to use. The pt. agreed to call crisis line numbers if having warning signs or having thoughts of suicide. Pt. listed their current mood as "I feel good like color purple". Counseling:  Therapist discussed the purpose of a healthy support system.  Therapist talked about how healthy supports can create feelings of happiness, security and comfort. Additionally, how the role negative supports can play in our lives and how that can bring about feelings of insecurity and helplessness.  Therapist asked group to name one support in their lives and in what way are they supportive.  Pt. was sitting up and alert; however, pt. was not  Verbally active in group.  Pt. stated," my daughter is my support". [name] 10/14/2011. 11:55 AM

## 2011-10-14 NOTE — Progress Notes (Signed)
Psychoeducational Group Note  Date:  10/14/2011 Time:  1515  Group Topic/Focus:  Conflict Resolution:   The focus of this group is to discuss the conflict resolution process and how it may be used upon discharge.  Participation Level:  Active  Participation Quality:  Attentive  Affect:  Appropriate  Cognitive:  Alert  Insight:  Good  Engagement in Group:  Good  Additional Comments:  none  Graceann Congress Celcia 10/14/2011, 5:58 PM

## 2011-10-14 NOTE — Progress Notes (Signed)
ANTICOAGULATION CONSULT NOTE - Follow Up Consult  Pharmacy Consult for coumadin Indication: h/o PE  Allergies  Allergen Reactions  . Darvocet (Propoxyphene-Acetaminophen)     Unknown     Patient Measurements: Height: 5\' 6"  (167.6 cm) Weight: 173 lb (78.472 kg) IBW/kg (Calculated) : 59.3    Vital Signs: Temp: 97.7 F (36.5 C) (07/14 0924) Temp src: Oral (07/14 0924) BP: 104/75 mmHg (07/14 0925) Pulse Rate: 102  (07/14 0925)  Labs:  Alvira Philips 10/14/11 0712 10/13/11 0705 10/12/11 0616  HGB -- -- --  HCT -- -- --  PLT -- -- --  APTT -- -- --  LABPROT 24.2* 17.6* 15.1  INR 2.13* 1.42 1.17  HEPARINUNFRC -- -- --  CREATININE -- -- --  CKTOTAL -- -- --  CKMB -- -- --  TROPONINI -- -- --    Estimated Creatinine Clearance: 84.3 ml/min (by C-G formula based on Cr of 0.92).   Medications:  Scheduled:    . diazepam  5 mg Oral [id number]  . HYDROcodone-acetaminophen  1 tablet Oral BH-q8a2phs  . HYDROcodone-acetaminophen  1 tablet Oral Once  . nicotine  21 mg Transdermal Daily  . nitrofurantoin (macrocrystal-monohydrate)  100 mg Oral BID WC  . risperidone  4 mg Oral QHS  . traZODone  50 mg Oral NOW  . warfarin  2.5 mg Oral ONCE-1800  . warfarin  7.5 mg Oral ONCE-1800  . Warfarin - Pharmacist Dosing Inpatient   Does not apply q1800  . DISCONTD: enoxaparin (LOVENOX) injection  80 mg Subcutaneous BID    Assessment: INR at goal.  No problems noted.   Goal of Therapy:  INR 2-3    Plan:  DC Coumadin as INR >2.0 today. Coumadin 2.5 mg x 1 today at 1800 PT/INR in am    [name] 10/14/2011,10:44 AM

## 2011-10-14 NOTE — Progress Notes (Signed)
Patient ID: [name], female   DOB: 09-Oct-1970, 41 y.o.   MRN: [id number] [name]  41 y.o.  [id number] 08-24-1970  42/14/2013  4 Diagnosis: PTSD                     Schizophrenia, paranoid  Subjective: [name] has been up pacing the [name] through out the night and during the day. She was given an extra hydrocodone during the night when RN called to say she was still complaining of pain as well as trazodone.  Today [name] says she did not sleep good and reports the same.  She does report that she is having pain.  Notes state that she does not stay in group long but gets up and leaves. [name] states that it is due to her legs cramping and pain.    Vital Signs:Blood pressure 104/75, pulse 102, temperature 97.7 F (36.5 C), temperature source Oral, resp. rate 18, height 5\' 6"  (1.676 m), weight 78.472 kg (173 lb), last menstrual period 10/09/2011.   Objective:  Her affect is flat, she is disheveled. [name] denies SI/HI or depression. States she has mild anxiety but does have hope. Her speech is clear and goal directed and her thought process is logical and coherent.  When offered to increase her Risperdal she declines and states that she wants to stay on the lowest dose possible.  But she does note that she is not on her usual dose of pain meds and reports that she gets 5/7.5 of her hydrocodone from her NP.  Once again she is still focused on her PT/INR which is up today.  Medications Scheduled:  . diazepam  5 mg Oral BH-[id number]  . HYDROcodone-acetaminophen  1 tablet Oral BH-q8a2phs  . HYDROcodone-acetaminophen  1 tablet Oral Once  . nicotine  21 mg Transdermal Daily  . nitrofurantoin (macrocrystal-monohydrate)  100 mg Oral BID WC  . risperidone  4 mg Oral QHS  . traZODone  50 mg Oral NOW  . warfarin  2.5 mg Oral ONCE-1800  . warfarin  7.5 mg Oral ONCE-1800  . Warfarin - Pharmacist Dosing Inpatient   Does not apply q1800  . DISCONTD: enoxaparin (LOVENOX) injection  80 mg Subcutaneous BID   PRN  Meds alum & mag hydroxide-simeth, diphenhydrAMINE, magnesium hydroxide  Plan: 1. I have checked her hydrocodone dosage on the [place] Controlled substance registry and she is correct that she is on 7.5 of hydrocodone q 6 hours prn pain.  She has been on this consistently. 2. I have increased her dose to her normal 7.5 po q 6 hrs. Prn pain. 3. She has declined an increase in her risperdal, but does understand that her symptoms are such that going home currently is not a good idea at this time and she is ok with seeing how she does tonight. 4. Will re-evaluate her response to pain meds in AM. Lloyd Huger T. Jahmire Ruffins PAC Rosalynd Mcwright T. Alyssia Heese [name] 42/14/2013 3:41 PM

## 2011-10-15 MED ORDER — HYDROCODONE-ACETAMINOPHEN 5-325 MG PO TABS
1.5000 | ORAL_TABLET | Freq: Four times a day (QID) | ORAL | Status: AC | PRN
  Administered 2011-10-15 (×3): 1.5 via ORAL
  Filled 2011-10-15 (×2): qty 2

## 2011-10-15 MED ORDER — WARFARIN SODIUM 7.5 MG PO TABS
7.5000 mg | ORAL_TABLET | Freq: Once | ORAL | Status: AC
  Administered 2011-10-15: 7.5 mg via ORAL
  Filled 2011-10-15: qty 1

## 2011-10-15 NOTE — Progress Notes (Signed)
ANTICOAGULATION CONSULT NOTE - Follow Up Consult  Pharmacy Consult for Coumadin Indication: pulmonary embolus  Allergies  Allergen Reactions  . Darvocet (Propoxyphene-Acetaminophen)     Unknown     Patient Measurements: Height: 5\' 6"  (167.6 cm) Weight: 173 lb (78.472 kg) IBW/kg (Calculated) : 59.3    Vital Signs: Temp: 98.2 F (36.8 C) (07/15 0753) Temp src: Oral (07/15 0753) BP: 88/61 mmHg (07/15 0754) Pulse Rate: 123  (07/15 0754)  Labs:  Alvira Philips 10/15/11 0610 10/14/11 0712 10/13/11 0705  HGB -- -- --  HCT -- -- --  PLT -- -- --  APTT -- -- --  LABPROT 22.0* 24.2* 17.6*  INR 1.89* 2.13* 1.42  HEPARINUNFRC -- -- --  CREATININE -- -- --  CKTOTAL -- -- --  CKMB -- -- --  TROPONINI -- -- --    Estimated Creatinine Clearance: 84.3 ml/min (by C-G formula based on Cr of 0.92).   Medications:  Scheduled:    . diazepam  5 mg Oral BH-q8a2phs  . HYDROcodone-acetaminophen  1 tablet Oral Once  . nicotine  21 mg Transdermal Daily  . nitrofurantoin (macrocrystal-monohydrate)  100 mg Oral BID WC  . risperidone  4 mg Oral QHS  . traZODone  50 mg Oral NOW  . warfarin  2.5 mg Oral ONCE-1800  . Warfarin - Pharmacist Dosing Inpatient   Does not apply q1800  . DISCONTD: enoxaparin (LOVENOX) injection  80 mg Subcutaneous BID  . DISCONTD: HYDROcodone-acetaminophen  1 tablet Oral BH-q8a2phs    Assessment: INR just below goal.  No problems noted.  Goal of Therapy:  INR 2-3    Plan:  Coumadin 7.5 mg x 1 today at 1800 PT/INR in am   [name] 10/15/2011,9:50 AM

## 2011-10-15 NOTE — Progress Notes (Signed)
Psychoeducational Group Note  Date:  10/15/2011 Time:  11.00  Group Topic/Focus:  Self Care:   The focus of this group is to help patients understand the importance of self-care in order to improve or restore emotional, physical, spiritual, interpersonal, and financial health.  Participation Level:  Did Not Attend  Participation Quality:    Affect:    Cognitive:    Insight:    Engagement in Group:    Additional Comments:  None   Marquis Lunch, Betsy Rosello 10/15/2011, 5:58 PM

## 2011-10-15 NOTE — Discharge Planning (Signed)
Patient slept through Aftercare Planning Group, so was seen in Treatment Team. She had no case management needs today.  She met later in the day with her Administrator, arts from [employer].  She is willing to continue with them.  Patient was pacing the [place] with lips moving, so Case Manager joined her and confirmed that she was praying.  Suggested she might want to pray without moving her lips so that nobody will misinterpret.  Utilization review done for additional days.  Ambrose Mantle, LCSW 10/15/2011, 4:52 PM

## 2011-10-15 NOTE — Progress Notes (Signed)
[name] MD Progress Note  10/15/2011 9:55 PM  Diagnosis:  Axis I: Schizophrenia - Paranoid Type.  Posttraumatic Stress Disorder.   The patient was seen today and reports the following:   ADL's: Intact.  Sleep: The patient reports that her sleep is improving.  Appetite: The patient reports a good appetite this morning.   Mild>(1-10) >Severe  Hopelessness (1-10): 0  Depression (1-10): 2-3  Anxiety (1-10): 2   Suicidal Ideation: The patient adamantly denies any suicidal ideations today.  Plan: No  Intent: No  Means: No   Homicidal Ideation: The patient adamantly denies any homicidal ideations today.  Plan: No  Intent: No.  Means: No   General Appearance/Behavior: The patient remained friendly and cooperative today with this provider.  Eye Contact: Good.  Speech: Appropriate in rate and volume with no pressuring noted today.  Motor Behavior: wnl.  Level of Consciousness: Alert and Oriented x 3.  Mental Status: Alert and Oriented x 3.  Mood: Appears mild to moderately depressed today.  Affect: Appears mild to moderately constricted.  Anxiety Level: Mild anxiety reported today.  Thought Process: wnl.  Thought Content: The patient denies any auditory or visual hallucinations today as well as any delusional thinking.  Perception: wnl.  Judgment: Good.  Insight: Good.  Cognition: Oriented to person, place and time.  Sleep:  Number of Hours: 5.75    Vital Signs:Blood pressure 88/61, pulse 123, temperature 98.2 F (36.8 C), temperature source Oral, resp. rate 24, height 5\' 6"  (1.676 m), weight 78.472 kg (173 lb), last menstrual period 10/09/2011.  Current Medications: Current Facility-Administered Medications  Medication Dose Route Frequency Provider Last Rate Last Dose  . alum & mag hydroxide-simeth (MAALOX/MYLANTA) 200-200-20 MG/5ML suspension 30 mL  30 mL Oral Q4H PRN Curlene Labrum Guerin Lashomb, MD      . diazepam (VALIUM) tablet 5 mg  5 mg Oral BH-q8a2phs Curlene Labrum Ming Kunka, MD   5 mg at  10/15/11 2152  . diphenhydrAMINE (BENADRYL) capsule 50 mg  50 mg Oral Q6H PRN Mike Craze, MD   50 mg at 10/14/11 2210  . HYDROcodone-acetaminophen (NORCO) 5-325 MG per tablet 1.5 tablet  1.5 tablet Oral Q6H PRN Verne Spurr, PA-C   1.5 tablet at 10/14/11 1710  . HYDROcodone-acetaminophen (NORCO) 5-325 MG per tablet 1.5 tablet  1.5 tablet Oral Q6H PRN Ronny Bacon, MD   1.5 tablet at 10/15/11 2153  . magnesium hydroxide (MILK OF MAGNESIA) suspension 30 mL  30 mL Oral Daily PRN Curlene Labrum Ashlon Lottman, MD      . nicotine (NICODERM CQ - dosed in mg/24 hours) patch 21 mg  21 mg Transdermal Daily Curlene Labrum Aveline Daus, MD   21 mg at 10/15/11 0938  . nitrofurantoin (macrocrystal-monohydrate) (MACROBID) capsule 100 mg  100 mg Oral BID WC Curlene Labrum Alany Borman, MD   100 mg at 10/15/11 1719  . risperiDONE (RISPERDAL) tablet 4 mg  4 mg Oral QHS Curlene Labrum Mirella Gueye, MD   4 mg at 10/15/11 2152  . traZODone (DESYREL) tablet 50 mg  50 mg Oral NOW Verne Spurr, PA-C      . warfarin (COUMADIN) tablet 7.5 mg  7.5 mg Oral ONCE-1800 Curlene Labrum Zariya Minner, MD   7.5 mg at 10/15/11 1719  . Warfarin - Pharmacist Dosing Inpatient   Does not apply [id number] Ronny Bacon, MD      . DISCONTD: HYDROcodone-acetaminophen (NORCO) 5-325 MG per tablet 1 tablet  1 tablet Oral Once Verne Spurr, PA-C      . DISCONTD:  HYDROcodone-acetaminophen (NORCO) 7.5-325 MG per tablet 1 tablet  1 tablet Oral Q6H PRN Verne Spurr, PA-C       Lab Results:  Results for orders placed during the hospital encounter of 10/10/11 (from the past 48 hour(s))  PROTIME-INR     Status: Abnormal   Collection Time   10/14/11  7:12 AM      Component Value Range Comment   Prothrombin Time 24.2 (*) 11.6 - 15.2 seconds    INR 2.13 (*) 0.00 - 1.49   PROTIME-INR     Status: Abnormal   Collection Time   10/15/11  6:10 AM      Component Value Range Comment   Prothrombin Time 22.0 (*) 11.6 - 15.2 seconds    INR 1.89 (*) 0.00 - 1.49    Physical Findings: AIMS: Facial and  Oral Movements Muscles of Facial Expression: None, normal Lips and Perioral Area: None, normal Jaw: None, normal Tongue: None, normal,Extremity Movements Upper (arms, wrists, hands, fingers): None, normal Lower (legs, knees, ankles, toes): None, normal, Trunk Movements Neck, shoulders, hips: None, normal, Overall Severity Severity of abnormal movements (highest score from questions above): None, normal Incapacitation due to abnormal movements: None, normal Patient's awareness of abnormal movements (rate only patient's report): No Awareness, Dental Status Current problems with teeth and/or dentures?: Yes Does patient usually wear dentures?: Yes   Time was spent today discussing with the patient her current symptoms. The patient states that her sleeping is improving and she reports a good appetite. The patient reports mild feelings of sadness, anhedonia and depressed mood and denies any suicidal or homicidal ideations. The patient reports mild anxiety and denies any auditory or visual hallucinations or delusional thinking today.  She also denies any medication related side effects today.  Treatment Plan Summary:  1. Daily contact with patient to assess and evaluate symptoms and progress in treatment.  2. Medication management  3. The patient will deny suicidal ideations or homicidal ideations for 48 hours prior to discharge and have a depression and anxiety rating of 3 or less. The patient will also deny any auditory or visual hallucinations or delusional thinking.  4. The patient will deny any symptoms of substance withdrawal at time of discharge.   Plan:  1. Will continue the medication Risperdal at 4 mgs po qhs for paranoid delusions.  2. Will continue the patient on her non-psychiatric medications as listed above.  3. Will continue the medication Warfarin as prescribed. This will be followed by pharmacy.  4. Will continue the medication Macrobid at 100 mgs po BID-WC x 7 days for UTI which  was started on October 11, 2011. 5. Laboratory studies reviewed.  6. Will continue to monitor.   [name] 10/15/2011, 9:55 PM

## 2011-10-15 NOTE — Tx Team (Signed)
Interdisciplinary Treatment Plan Update (Adult)  Date:  10/15/2011  Time Reviewed:  10:15AM-11:15AM  Progress in Treatment: Attending groups:  Yes, has missed 4 groups and was apologetic Participating in groups:    Yes, when attends, will respond when called on Taking medication as prescribed:    Yes Tolerating medication:   Yes Family/Significant other contact made:  No Patient understands diagnosis:   Yes, limited insight, poor judgment Discussing patient identified problems/goals with staff:   Yes Medical problems stabilized or resolved:   No, Coumadin level still low Denies suicidal/homicidal ideation:  Yes Issues/concerns per patient self-inventory:   None Other:    New problem(s) identified: No, Describe:    Reason for Continuation of Hospitalization: Medical Issues Medication stabilization Other; describe not sleeping well  Interventions implemented related to continuation of hospitalization:  Medication monitoring and adjustment, safety checks Q15 min., suicide risk assessment, group therapy, psychoeducation, collateral contact, aftercare planning, ongoing physician assessments, medication education  Additional comments:  Not applicable  Estimated length of stay:  1-2 days  Discharge Plan:  Go back to live with "Ex" boyfriend, follow up with her Primary Care Physician and [facility] Community Support team  New goal(s):  Not applicable  Review of initial/current patient goals per problem list:   1.  Goal(s):  Medication stabilization  Met:  No  Target date:  By Discharge   As evidenced by:  Ongoing, especially Coumadin  2.  Goal(s):  Return sleep to normal pattern of 6+ hours nightly.  Met:  No  Target date:  By Discharge   As evidenced by:  Slept well last night, but previously did not sleep this weekend   3.  Goal(s):  Reduce delusions to baseline per patient report and collateral.  Met:  Yes  Target date:  By Discharge   As evidenced by:  No  delusions  4.  Goal(s):  Agree to follow-up at higher level due to repeated hospitalizations.  Met:  Yes  Target date:  By Discharge   As evidenced by:  Visited with [facility] today, and is willing to continue with them.  Attendees: Patient:  [name]  10/15/2011 10:15AM-11:15AM  Family:     Physician:  Dr. Harvie Heck Readling 10/15/2011 10:15AM-11:15AM  Nursing:   Joslyn Devon, RN 10/15/2011 10:15AM -11:15AM   Case Manager:  Ambrose Mantle, LCSW 10/15/2011 10:15AM-11:15AM  Counselor:  Marni Griffon, LCAS 10/15/2011 10:15AM-11:15AM  Other:    Tacy Learn, RN 10/15/2011 10:17 AM   Other:      Other:      Other:       Scribe for Treatment Team:   Sarina Ser, 10/15/2011, 10:15AM-11:15AM

## 2011-10-15 NOTE — Progress Notes (Signed)
Patient ID: [name], female   DOB: 02-Jan-1971, 41 y.o.   MRN: [id number] D. The patient has a blunted affect and depressed mood. Was observed pacing the [place] all evening, talking to herself.  A. Engage in 1:1 to assess. Encouraged to attend evening group. Medicated with scheduled HS medications. R. Denied A/V hallucinations. Stated she was praying when asked about talking to herself. Did not attend evening group, paced the halls instead. Compliant with medication.

## 2011-10-15 NOTE — Progress Notes (Signed)
D: pt has been pacing the hallway the entire shift. Appears blunted and depressed. Calm and cooperative with assessment. No acute distress noted. States she had a good day. When asked to qualify good day, she replied, "I got plenty of exercise, learned some coping skills and I need to keep taking what yall give me.". States the walking helps to keep my back pain under control. C/O bil foot pain, but denies back pain.. Denies SI/HI/AVH and contracts for safety. Although she denies AVH, she has been observed talking to herself and walking somewhat bent over with her arms spread like she is corralling ducks. She dismisses this as praying.  A: Support and encouragement provided. Safety has been maintained with Q15 minute observation. Encouraged pt to get periods of rest while pacing to help relieve the stress on her feet. Gave prn for feet pain.  Medications given as ordered by MD.    R: Pt remains safe. Is compliant with treatment goals and medications. Offers no additional concerns. Will continue Q15 minute observation and continue current POC.

## 2011-10-15 NOTE — Progress Notes (Signed)
[facility] Group Notes:  (Counselor/Nursing/MHT/Case Management/Adjunct)  10/15/2011 2:15 PM  Type of Therapy: Group Therapy   Participation Level: Minimal   Participation Quality: Limited  Affect: Blunted   Cognitive: oriented, alert   Insight: minimal  Engagement in Group: Limited  Modes of Intervention: Clarification, Education, Problem-solving, Socialization, Activity, Encouragement and Support   Summary of Progress/Problems: Pt remained in group for the entire session.  Marked improvement.  Pt participated in group by listening attentively and self disclosing.  Therapist addressed the whole person concept of Wellness by prompting Pts to identify things that made them feel good in each of the following areas:  Physical, Mental, Emotional, and Spiritual.  Therapist encouraged Pts to make a list of things they can do each day to include positive activities in each area.  Therapist explained the Positive Affirmation Activity.  Pt actively participated by giving a positive affirmation to another Pt.  Pt was open to feedback from others, responded with a smile, and acknowledged elevation of mood. Therapist offered support and encouragement.  Some progress noted.  Intervention effective.         Marni Griffon C 10/15/2011, 2:15 PM

## 2011-10-15 NOTE — Progress Notes (Signed)
Pt has been up most of the day today and has been able to participate in various activities, pt did state that she is feeling better now and cites the increase in pain medication as being helpful, pt has denied hearing any voices today although pt does pace the halls a lot and seen talking to herself, pt has received all medications today without incident, support and encouragement provided, will continue to monitor

## 2011-10-15 NOTE — Progress Notes (Signed)
Psychoeducational Group Note  Date:  10/15/2011 Time:  2030  Group Topic/Focus:  Wrap-Up Group:   The focus of this group is to help patients review their daily goal of treatment and discuss progress on daily workbooks.  Participation Level:  Did Not Attend  Additional Comments:  Did not attend  Dalia Heading 10/15/2011, 11:27 PM

## 2011-10-16 ENCOUNTER — Other Ambulatory Visit: Payer: Self-pay

## 2011-10-16 DIAGNOSIS — I2699 Other pulmonary embolism without acute cor pulmonale: Secondary | ICD-10-CM

## 2011-10-16 DIAGNOSIS — F200 Paranoid schizophrenia: Principal | ICD-10-CM

## 2011-10-16 DIAGNOSIS — R000 Tachycardia, unspecified: Secondary | ICD-10-CM

## 2011-10-16 MED ORDER — IBUPROFEN 600 MG PO TABS: 600.0000 mg | ORAL_TABLET | Freq: Three times a day (TID) | ORAL | Status: DC | PRN

## 2011-10-16 MED ORDER — IBUPROFEN 800 MG PO TABS
800.0000 mg | ORAL_TABLET | Freq: Once | ORAL | Status: AC
  Administered 2011-10-16: 800 mg via ORAL
  Filled 2011-10-16 (×2): qty 1

## 2011-10-16 MED ORDER — HYDROCODONE-ACETAMINOPHEN 5-325 MG PO TABS
1.5000 | ORAL_TABLET | Freq: Four times a day (QID) | ORAL | Status: DC | PRN
  Administered 2011-10-16 – 2011-10-18 (×6): 1.5 via ORAL
  Filled 2011-10-16 (×6): qty 2

## 2011-10-16 MED ORDER — WARFARIN SODIUM 5 MG PO TABS
5.0000 mg | ORAL_TABLET | Freq: Once | ORAL | Status: AC
  Administered 2011-10-16: 5 mg via ORAL
  Filled 2011-10-16: qty 1

## 2011-10-16 NOTE — Progress Notes (Signed)
[facility] Group Notes:  (Counselor/Nursing/MHT/Case Management/Adjunct)  10/16/2011  10:30  AM  Type of Therapy: Group Therapy   Participation Level: Minimal   Participation Quality: Limited  Affect: Blunted  Cognitive: oriented, alert   Insight: Poor  Engagement in Group: Limited  Modes of Intervention: Clarification, Education, Problem-solving, Socialization, Activity, Encouragement and Support   Summary of Progress/Problems: Pt participated in group by listening attentively and self disclosing.  After a brief check-in, therapist introduced the topic of Recovery and explained how recovery applied to mental as well as physical health.  Therapist explained that development of good coping strategies helped enhance recovery.  Therapist prompted patients to identify how they feel personally when they are beginning to feel angry, anxious, sad, depressed, or wanting to use alcohol or drugs.   Therapist prompted patients to verbalize examples of situations, people, places, or things that trigger those feelings.  Therapist asked patients to identify activities or techniques that help cope with those feelings. Pt was limited engaged in the process.  Therapist encouraged Pts to take a personal inventory of each day and praise themselves for using positive coping skills.  Therapist offered support and encouragement.  Progress noted.  Intervention effective.         Marni Griffon C 10/16/2011  10:30 AM

## 2011-10-16 NOTE — Progress Notes (Signed)
ANTICOAGULATION CONSULT NOTE - Follow Up Consult  Pharmacy Consult for Coumadin Indication: pulmonary embolus  Allergies  Allergen Reactions  . Darvocet (Propoxyphene-Acetaminophen)     Unknown     Patient Measurements: Height: 5\' 6"  (167.6 cm) Weight: 173 lb (78.472 kg) IBW/kg (Calculated) : 59.3    Vital Signs: Temp: 97.5 F (36.4 C) (07/16 0759) Temp src: Oral (07/16 0759) BP: 121/80 mmHg (07/16 0800) Pulse Rate: 121  (07/16 0800)  Labs:  [name] 10/16/11 0605 10/15/11 0610 10/14/11 0712  HGB -- -- --  HCT -- -- --  PLT -- -- --  APTT -- -- --  LABPROT 24.5* 22.0* 24.2*  INR 2.16* 1.89* 2.13*  HEPARINUNFRC -- -- --  CREATININE -- -- --  CKTOTAL -- -- --  CKMB -- -- --  TROPONINI -- -- --    Estimated Creatinine Clearance: 84.3 ml/min (by C-G formula based on Cr of 0.92).   Medications:  Scheduled:    . diazepam  5 mg Oral [id number]  . ibuprofen  800 mg Oral Once  . nicotine  21 mg Transdermal Daily  . nitrofurantoin (macrocrystal-monohydrate)  100 mg Oral BID WC  . risperidone  4 mg Oral QHS  . warfarin  7.5 mg Oral ONCE-1800  . Warfarin - Pharmacist Dosing Inpatient   Does not apply q1800  . DISCONTD: HYDROcodone-acetaminophen  1 tablet Oral Once    Assessment: INR at goal.  No problems noted. Would consider to start Coumadin 5 mg T-R-S-S and 2.5mg  M-W-F if discharged at this time. Slight decrease in prior to admission dosing due to elevated INR on admission.  Will need f/u within 3 days of discharge.      Goal of Therapy:  INR 2-3   Plan:  Coumadin 5 mg po x 1 today.   INR/PT in am   [name] 10/16/2011,9:25 AM
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
## 2011-10-16 NOTE — Progress Notes (Signed)
Psychoeducational Group Note  Date:  10/16/2011 Time:  1100  Group Topic/Focus:  Recovery Goals:   The focus of this group is to identify appropriate goals for recovery and establish a plan to achieve them.  Participation Level: Did Not Attend  Participation Quality:  Not Applicable  Affect:  Not Applicable  Cognitive:  Not Applicable  Insight:  Not Applicable  Engagement in Group: Not Applicable  Additional Comments:  Pt said that she rather walk up and down the [place] instead of sitting in group cause it will hurt her back.  [name] E 10/16/2011, 5:17 PM

## 2011-10-16 NOTE — Progress Notes (Signed)
D: Was in bed, resting quietly with eyes closed on approach. Opened eyes spontaneously to name. Appears flat and depressed. Calm and cooperative with assessment. No acute distress noted. Continues to pace the hallways while awake. Was allowed to walk on [address] while maintenance was done to her room. She was appropriate the entire time and came back to unit when the work was completed. States she has had an "ok" day but feels extremely worn out. Did endorse pain in her back. Denies SI/HI/AVH and contracts for safety. Offered no additional questions or concerns.  A: Safety has been maintained with Q15 minute observation. Support and encouragement offered. Provided prn for c/o pain. Encouraged to try to stay in bed the remainder of the night (was up essentially all night last night) and she said she will do her best. Medications given as ordered by MD.   R: Pt remains safe. Is calm and pleasant. She is compliant with her treatment plan and medications. Will continue current POC and continue Q15 minute observation.

## 2011-10-16 NOTE — Progress Notes (Signed)
[facility] Group Notes:  (Counselor/Nursing/MHT/Case Management/Adjunct)  10/16/2011 8:00PM  Pt did not attend group.  Jameshia Hayashida, Randal Buba 10/16/2011, 10:34 PM

## 2011-10-16 NOTE — Tx Team (Signed)
Interdisciplinary Treatment Plan Update (Adult)   Date: 10/16/2011   Time Reviewed: 10:15AM-11:15AM   Progress in Treatment:  Attending groups: Yes Participating in groups: Yes, when attends, will respond when called on  Taking medication as prescribed: Yes  Tolerating medication: Yes  Family/Significant other contact made: No  Patient understands diagnosis: Yes, limited insight, poor judgment  Discussing patient identified problems/goals with staff: Yes  Medical problems stabilized or resolved: No, Coumadin level okay now, but tachycardia present Denies suicidal/homicidal ideation: Yes  Issues/concerns per patient self-inventory: None  Other:   New problem(s) identified: No, Describe:   Reason for Continuation of Hospitalization:  Medical Issues  Medication stabilization  Other; describe not sleeping well   Interventions implemented related to continuation of hospitalization: Medication monitoring and adjustment, safety checks Q15 min., suicide risk assessment, group therapy, psychoeducation, collateral contact, aftercare planning, ongoing physician assessments, medication education   Additional comments: Appears more depressed than she says   Estimated length of stay: 1 day  Discharge Plan: Go back to live with "Ex" boyfriend, follow up with her Primary Care Physician and [facility] Community Support team   New goal(s): Not applicable   Review of initial/current patient goals per problem list:  1. Goal(s): Medication stabilization  Met: No  Target date: By Discharge  As evidenced by: Ongoing, especially Coumadin  2. Goal(s): Return sleep to normal pattern of 6+ hours nightly.  Met: No  Target date: By Discharge  As evidenced by: Did not sleep last night due to pain  3. Goal(s): Reduce delusions to baseline per patient report and collateral.  Met: Yes  Target date: By Discharge  As evidenced by: No delusions  4. Goal(s): Agree to follow-up at higher level due to repeated  hospitalizations.  Met: Yes  Target date: By Discharge  As evidenced by: Visited with [facility] today, and is willing to continue with them.  Attendees:  Patient: [name]  10/16/2011 10:15AM-11:15AM   Family:    Physician: Dr. Harvie Heck Readling  7/162013 10:15AM-11:15AM   Nursing: Joslyn Devon, RN  10/16/2011 10:15AM -11:15AM   Case Manager: Ambrose Mantle, LCSW  10/16/2011 10:15AM-11:15AM   Counselor: Marni Griffon, LCAS  10/16/2011 10:15AM-11:15AM   Other:     Other:    Other:    Other:    Scribe for Treatment Team:  Sarina Ser, 10/16/2011, 10:15AM-11:15AM
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
## 2011-10-16 NOTE — Progress Notes (Signed)
[facility] MD Progress Note  10/16/2011 6:00 PM  Diagnosis:  Axis I: Schizophrenia - Paranoid Type.  Posttraumatic Stress Disorder.   The patient was seen today and reports the following:   ADL's: Intact.  Sleep: The patient reports that her sleep was poor last night due to her narcotic pain medication expiring and the patient being in pain most of the night. Appetite: The patient reports a good appetite this morning.   Mild>(1-10) >Severe  Hopelessness (1-10): 0  Depression (1-10): 2-3  Anxiety (1-10): 2   Suicidal Ideation: The patient adamantly denies any suicidal ideations today.  Plan: No  Intent: No  Means: No   Homicidal Ideation: The patient adamantly denies any homicidal ideations today.  Plan: No  Intent: No.  Means: No   General Appearance/Behavior: The patient remained friendly and cooperative today with this provider.  Eye Contact: Good.  Speech: Appropriate in rate and volume with no pressuring noted today.  Motor Behavior: wnl.  Level of Consciousness: Alert and Oriented x 3.  Mental Status: Alert and Oriented x 3.  Mood: Appears mild to moderately depressed today.  Affect: Appears mild to moderately constricted.  Anxiety Level: Mild anxiety reported today.  Thought Process: wnl.  Thought Content: The patient denies any auditory or visual hallucinations today as well as any delusional thinking.  Perception: wnl.  Judgment: Good.  Insight: Good.  Cognition: Oriented to person, place and time.  Sleep:  Number of Hours: 0.5    Vital Signs:Blood pressure 121/80, pulse 121, temperature 97.5 F (36.4 C), temperature source Oral, resp. rate 20, height 5\' 6"  (1.676 m), weight 78.472 kg (173 lb), last menstrual period 10/09/2011.  Current Medications: Current Facility-Administered Medications  Medication Dose Route Frequency Provider Last Rate Last Dose  . alum & mag hydroxide-simeth (MAALOX/MYLANTA) 200-200-20 MG/5ML suspension 30 mL  30 mL Oral Q4H PRN Curlene Labrum  Mallerie Blok, MD      . diazepam (VALIUM) tablet 5 mg  5 mg Oral [id number] Curlene Labrum Breyon Blass, MD   5 mg at 10/16/11 1408  . diphenhydrAMINE (BENADRYL) capsule 50 mg  50 mg Oral Q6H PRN Mike Craze, MD   50 mg at 10/14/11 2210  . HYDROcodone-acetaminophen (NORCO) 5-325 MG per tablet 1.5 tablet  1.5 tablet Oral Q6H PRN Ronny Bacon, MD   1.5 tablet at 10/15/11 2153  . HYDROcodone-acetaminophen (NORCO/VICODIN) 5-325 MG per tablet 1.5 tablet  1.5 tablet Oral Q6H PRN Curlene Labrum Makynli Stills, MD   1.5 tablet at 10/16/11 1600  . ibuprofen (ADVIL,MOTRIN) tablet 600 mg  600 mg Oral Q8H PRN Curlene Labrum Aviv Lengacher, MD      . ibuprofen (ADVIL,MOTRIN) tablet 800 mg  800 mg Oral Once Sanjuana Kava, NP   800 mg at 10/16/11 0354  . magnesium hydroxide (MILK OF MAGNESIA) suspension 30 mL  30 mL Oral Daily PRN Curlene Labrum Siyana Erney, MD      . nicotine (NICODERM CQ - dosed in mg/24 hours) patch 21 mg  21 mg Transdermal Daily Curlene Labrum Haylei Cobin, MD   21 mg at 10/16/11 0751  . nitrofurantoin (macrocrystal-monohydrate) (MACROBID) capsule 100 mg  100 mg Oral BID WC Curlene Labrum Taurus Alamo, MD   100 mg at 10/16/11 1713  . risperiDONE (RISPERDAL) tablet 4 mg  4 mg Oral QHS Curlene Labrum Yacine Droz, MD   4 mg at 10/15/11 2152  . warfarin (COUMADIN) tablet 5 mg  5 mg Oral ONCE-1800 Ronny Bacon, MD      . Warfarin - Pharmacist Dosing  Inpatient   Does not apply q1800 Ronny Bacon, MD      . DISCONTD: HYDROcodone-acetaminophen (NORCO) 5-325 MG per tablet 1 tablet  1 tablet Oral Once Verne Spurr, PA-C       Lab Results:  Results for orders placed during the hospital encounter of 10/10/11 (from the past 48 hour(s))  PROTIME-INR     Status: Abnormal   Collection Time   10/15/11  6:10 AM      Component Value Range Comment   Prothrombin Time 22.0 (*) 11.6 - 15.2 seconds    INR 1.89 (*) 0.00 - 1.49   PROTIME-INR     Status: Abnormal   Collection Time   10/16/11  6:05 AM      Component Value Range Comment   Prothrombin Time 24.5 (*) 11.6 - 15.2  seconds    INR 2.16 (*) 0.00 - 1.49    Physical Findings: AIMS: Facial and Oral Movements Muscles of Facial Expression: None, normal Lips and Perioral Area: None, normal Jaw: None, normal Tongue: None, normal,Extremity Movements Upper (arms, wrists, hands, fingers): None, normal Lower (legs, knees, ankles, toes): None, normal, Trunk Movements Neck, shoulders, hips: None, normal, Overall Severity Severity of abnormal movements (highest score from questions above): None, normal Incapacitation due to abnormal movements: None, normal Patient's awareness of abnormal movements (rate only patient's report): No Awareness, Dental Status Current problems with teeth and/or dentures?: Yes Does patient usually wear dentures?: Yes   Time was spent today discussing with the patient her current symptoms. The patient states that her sleep was poor last night due to her pain medication expiring.  She reports a good appetite and reports mild feelings of sadness, anhedonia and depressed mood.  The patient denies any suicidal or homicidal ideations today. The patient reports mild anxiety and denies any auditory or visual hallucinations or delusional thinking today. She also denies any medication related side effects today.   Treatment Plan Summary:  1. Daily contact with patient to assess and evaluate symptoms and progress in treatment.  2. Medication management  3. The patient will deny suicidal ideations or homicidal ideations for 48 hours prior to discharge and have a depression and anxiety rating of 3 or less. The patient will also deny any auditory or visual hallucinations or delusional thinking.  4. The patient will deny any symptoms of substance withdrawal at time of discharge.   Plan:  1. Will continue the medication Risperdal at 4 mgs po qhs for paranoid delusions.  2. Will continue the patient on her non-psychiatric medications as listed above.  3. Will continue the medication Warfarin as  prescribed. This will be followed by pharmacy.  4. Will continue the medication Macrobid at 100 mgs po BID-WC x 7 days for UTI which was started on October 11, 2011.  5. Laboratory studies reviewed.  6. Will order a CBC with Diff, CMP and UA for this evening. 7. Will continue to monitor.   Kymberli Wiegand 10/16/2011, 6:00 PM

## 2011-10-16 NOTE — Progress Notes (Signed)
Pt has been up pacing on and off during the morning with complaints of feet pain. Reported to MD and received new order for pain medication. Gave prn med as prescribed. Pt hrt rate over 100. Reported to PA and completed an EKG. Following pain medication, pt has been resting in her bed. Respirations even and unlabored. Pt's affect is flat and she denies si, hi, and voices. Safety maintained on unit.

## 2011-10-17 LAB — URINALYSIS, ROUTINE W REFLEX MICROSCOPIC
Glucose, UA: NEGATIVE mg/dL
Protein, ur: NEGATIVE mg/dL
Specific Gravity, Urine: 1.01 (ref 1.005–1.030)
pH: 6.5 (ref 5.0–8.0)

## 2011-10-17 LAB — PROTIME-INR
INR: 2.28 — ABNORMAL HIGH (ref 0.00–1.49)
Prothrombin Time: 25.5 seconds — ABNORMAL HIGH (ref 11.6–15.2)

## 2011-10-17 LAB — COMPREHENSIVE METABOLIC PANEL
ALT: 10 U/L (ref 0–35)
Albumin: 3.5 g/dL (ref 3.5–5.2)
Alkaline Phosphatase: 70 U/L (ref 39–117)
GFR calc Af Amer: 83 mL/min — ABNORMAL LOW (ref >90)
Glucose, Bld: 104 mg/dL — ABNORMAL HIGH (ref 70–99)
Potassium: 4 mEq/L (ref 3.5–5.1)
Sodium: 136 mEq/L (ref 135–145)
Total Protein: 7 g/dL (ref 6.0–8.3)

## 2011-10-17 LAB — CBC WITH DIFFERENTIAL/PLATELET
Eosinophils Absolute: 0.2 10*3/uL (ref 0.0–0.7)
Lymphs Abs: 2.6 10*3/uL (ref 0.7–4.0)
MCH: 30.7 pg (ref 26.0–34.0)
Neutro Abs: 2.7 10*3/uL (ref 1.7–7.7)
Neutrophils Relative %: 43 % (ref 43–77)
Platelets: 381 10*3/uL (ref 150–400)
RBC: 4.11 MIL/uL (ref 3.87–5.11)
WBC: 6.3 10*3/uL (ref 4.0–10.5)

## 2011-10-17 LAB — URINE MICROSCOPIC-ADD ON

## 2011-10-17 MED ORDER — WARFARIN SODIUM 5 MG PO TABS
5.0000 mg | ORAL_TABLET | ORAL | Status: DC
  Filled 2011-10-17: qty 1
  Filled 2011-10-17: qty 8

## 2011-10-17 MED ORDER — WARFARIN SODIUM 2.5 MG PO TABS
2.5000 mg | ORAL_TABLET | ORAL | Status: DC
  Administered 2011-10-17: 2.5 mg via ORAL
  Filled 2011-10-17: qty 1
  Filled 2011-10-17: qty 6

## 2011-10-17 NOTE — Progress Notes (Signed)
[facility] MD Progress Note  10/17/2011 3:05 PM  Diagnosis:  Axis I: Schizophrenia - Paranoid Type.  Posttraumatic Stress Disorder.   The patient was seen today and reports the following:   ADL's: Intact.  Sleep: The patient reports that her sleep was good last night after restarting her pain medication. Appetite: The patient reports a good appetite this morning.   Mild>(1-10) >Severe  Hopelessness (1-10): 0  Depression (1-10): 0  Anxiety (1-10): 0   Suicidal Ideation: The patient adamantly denies any suicidal ideations today.  Plan: No  Intent: No  Means: No   Homicidal Ideation: The patient adamantly denies any homicidal ideations today.  Plan: No  Intent: No.  Means: No   General Appearance/Behavior: The patient remains friendly and cooperative today with this provider.  Eye Contact: Good.  Speech: Appropriate in rate and volume with no pressuring noted today.  Motor Behavior: wnl.  Level of Consciousness: Alert and Oriented x 3.  Mental Status: Alert and Oriented x 3.  Mood: Appears mildly depressed today.  Affect: Appears mildly constricted.  Anxiety Level: No anxiety reported today.  Thought Process: wnl.  Thought Content: The patient denies any auditory or visual hallucinations today as well as any delusional thinking.  Perception: wnl.  Judgment: Good.  Insight: Good.  Cognition: Oriented to person, place and time.  Sleep:  Number of Hours: 6.75    Vital Signs:Blood pressure 89/64, pulse 114, temperature 98.4 F (36.9 C), temperature source Oral, resp. rate 18, height 5\' 6"  (1.676 m), weight 78.472 kg (173 lb), last menstrual period 10/09/2011.  Current Medications: Current Facility-Administered Medications  Medication Dose Route Frequency Provider Last Rate Last Dose  . alum & mag hydroxide-simeth (MAALOX/MYLANTA) 200-200-20 MG/5ML suspension 30 mL  30 mL Oral Q4H PRN Curlene Labrum Karita Dralle, MD      . diazepam (VALIUM) tablet 5 mg  5 mg Oral [id number] Curlene Labrum  Klohe Lovering, MD   5 mg at 10/17/11 1332  . diphenhydrAMINE (BENADRYL) capsule 50 mg  50 mg Oral Q6H PRN Mike Craze, MD   50 mg at 10/14/11 2210  . HYDROcodone-acetaminophen (NORCO/VICODIN) 5-325 MG per tablet 1.5 tablet  1.5 tablet Oral Q6H PRN Ronny Bacon, MD   1.5 tablet at 10/17/11 0819  . ibuprofen (ADVIL,MOTRIN) tablet 600 mg  600 mg Oral Q8H PRN Curlene Labrum Lark Runk, MD      . magnesium hydroxide (MILK OF MAGNESIA) suspension 30 mL  30 mL Oral Daily PRN Curlene Labrum Ertha Nabor, MD      . nicotine (NICODERM CQ - dosed in mg/24 hours) patch 21 mg  21 mg Transdermal Daily Curlene Labrum Osmel Dykstra, MD   21 mg at 10/17/11 0819  . nitrofurantoin (macrocrystal-monohydrate) (MACROBID) capsule 100 mg  100 mg Oral BID WC Curlene Labrum Jalynne Persico, MD   100 mg at 10/17/11 0817  . risperiDONE (RISPERDAL) tablet 4 mg  4 mg Oral QHS Curlene Labrum Chanel Mcadams, MD   4 mg at 10/16/11 2212  . warfarin (COUMADIN) tablet 2.5 mg  2.5 mg Oral Custom Curlene Labrum Blaze Nylund, MD      . warfarin (COUMADIN) tablet 5 mg  5 mg Oral ONCE-1800 Curlene Labrum Havard Radigan, MD   5 mg at 10/16/11 1815  . warfarin (COUMADIN) tablet 5 mg  5 mg Oral Custom Ronny Bacon, MD      . Warfarin - Pharmacist Dosing Inpatient   Does not apply [id number] Ronny Bacon, MD       Lab Results:  Results for orders  placed during the hospital encounter of 10/10/11 (from the past 48 hour(s))  PROTIME-INR     Status: Abnormal   Collection Time   10/16/11  6:05 AM      Component Value Range Comment   Prothrombin Time 24.5 (*) 11.6 - 15.2 seconds    INR 2.16 (*) 0.00 - 1.49   URINALYSIS, ROUTINE W REFLEX MICROSCOPIC     Status: Abnormal   Collection Time   10/16/11  6:29 AM      Component Value Range Comment   Color, Urine YELLOW  YELLOW    APPearance CLOUDY (*) CLEAR    Specific Gravity, Urine 1.010  1.005 - 1.030    pH 6.5  5.0 - 8.0    Glucose, UA NEGATIVE  NEGATIVE mg/dL    Hgb urine dipstick SMALL (*) NEGATIVE    Bilirubin Urine NEGATIVE  NEGATIVE    Ketones, ur NEGATIVE   NEGATIVE mg/dL    Protein, ur NEGATIVE  NEGATIVE mg/dL    Urobilinogen, UA 0.2  0.0 - 1.0 mg/dL    Nitrite NEGATIVE  NEGATIVE    Leukocytes, UA TRACE (*) NEGATIVE   URINE MICROSCOPIC-ADD ON     Status: Abnormal   Collection Time   10/16/11  6:29 AM      Component Value Range Comment   Squamous Epithelial / LPF MANY (*) RARE    WBC, UA 3-6  <3 WBC/hpf    RBC / HPF 3-6  <3 RBC/hpf    Bacteria, UA MANY (*) RARE   PROTIME-INR     Status: Abnormal   Collection Time   10/17/11  6:19 AM      Component Value Range Comment   Prothrombin Time 25.5 (*) 11.6 - 15.2 seconds    INR 2.28 (*) 0.00 - 1.49   CBC WITH DIFFERENTIAL     Status: Abnormal   Collection Time   10/17/11  6:19 AM      Component Value Range Comment   WBC 6.3  4.0 - 10.5 K/uL    RBC 4.11  3.87 - 5.11 MIL/uL    Hemoglobin 12.6  12.0 - 15.0 g/dL    HCT 08.6  57.8 - 46.9 %    MCV 93.7  78.0 - 100.0 fL    MCH 30.7  26.0 - 34.0 pg    MCHC 32.7  30.0 - 36.0 g/dL    RDW 62.9 (*) 52.8 - 15.5 %    Platelets 381  150 - 400 K/uL    Neutrophils Relative 43  43 - 77 %    Neutro Abs 2.7  1.7 - 7.7 K/uL    Lymphocytes Relative 41  12 - 46 %    Lymphs Abs 2.6  0.7 - 4.0 K/uL    Monocytes Relative 12  3 - 12 %    Monocytes Absolute 0.7  0.1 - 1.0 K/uL    Eosinophils Relative 4  0 - 5 %    Eosinophils Absolute 0.2  0.0 - 0.7 K/uL    Basophils Relative 1  0 - 1 %    Basophils Absolute 0.0  0.0 - 0.1 K/uL   COMPREHENSIVE METABOLIC PANEL     Status: Abnormal   Collection Time   10/17/11  6:19 AM      Component Value Range Comment   Sodium 136  135 - 145 mEq/L    Potassium 4.0  3.5 - 5.1 mEq/L    Chloride 100  96 - 112 mEq/L    CO2  26  19 - 32 mEq/L    Glucose, Bld 104 (*) 70 - 99 mg/dL    BUN 14  6 - 23 mg/dL    Creatinine, Ser 4.54  0.50 - 1.10 mg/dL    Calcium 9.7  8.4 - 09.8 mg/dL    Total Protein 7.0  6.0 - 8.3 g/dL    Albumin 3.5  3.5 - 5.2 g/dL    AST 21  0 - 37 U/L    ALT 10  0 - 35 U/L    Alkaline Phosphatase 70  39 -  117 U/L    Total Bilirubin 0.2 (*) 0.3 - 1.2 mg/dL    GFR calc non Af Amer 72 (*) >90 mL/min    GFR calc Af Amer 83 (*) >90 mL/min    Physical Findings: AIMS: Facial and Oral Movements Muscles of Facial Expression: None, normal Lips and Perioral Area: None, normal Jaw: None, normal Tongue: None, normal,Extremity Movements Upper (arms, wrists, hands, fingers): None, normal Lower (legs, knees, ankles, toes): None, normal, Trunk Movements Neck, shoulders, hips: None, normal, Overall Severity Severity of abnormal movements (highest score from questions above): None, normal Incapacitation due to abnormal movements: None, normal Patient's awareness of abnormal movements (rate only patient's report): No Awareness, Dental Status Current problems with teeth and/or dentures?: Yes Does patient usually wear dentures?: Yes   Time was spent today discussing with the patient her current symptoms. The patient states that her sleep was good last night after restarting her pain medications.  She reports a good appetite and denies any significant feelings of sadness, anhedonia or depressed mood. She however continues to appear mildly depressed.  The patient adamantly denies any suicidal or homicidal ideations today. The patient denies any anxiety and denies any auditory or visual hallucinations or delusional thinking today. She also denies any medication related side effects today.   The patient does report to feeling tired related to not being able to sleep Monday night due to pain.  It was discussed with the patient the possibility of discharge tomorrow since her symptoms are now under good control and the patient agreed.  Treatment Plan Summary:  1. Daily contact with patient to assess and evaluate symptoms and progress in treatment.  2. Medication management  3. The patient will deny suicidal ideations or homicidal ideations for 48 hours prior to discharge and have a depression and anxiety rating of 3 or  less. The patient will also deny any auditory or visual hallucinations or delusional thinking.  4. The patient will deny any symptoms of substance withdrawal at time of discharge.   Plan:  1. Will continue the medication Risperdal at 4 mgs po qhs for paranoid delusions.  2. Will continue the patient on her non-psychiatric medications as listed above.  3. Will continue the medication Warfarin as prescribed. This will be followed by pharmacy.  4. Will continue the medication Macrobid at 100 mgs po BID-WC x 7 days for UTI which was started on October 11, 2011.  5. Laboratory studies reviewed.  6. Will continue to monitor.  7. Possible discharge tomorrow.   Bernetha Anschutz 10/17/2011, 3:05 PM

## 2011-10-17 NOTE — Progress Notes (Signed)
Date: 10/17/2011          Time: 0930      Group Topic/Focus: The focus of this group is on enhancing the patient's understanding of leisure, barriers to leisure, and the importance of engaging in positive leisure activities upon discharge for improved total health.   Participation Level: Did not attend  Participation Quality: Not Applicable  Affect: Not Applicable  Cognitive: Not Applicable   Additional Comments: Patient sleeping, refused multiple invitations to attend group.   Samy Ryner 10/17/2011 11:37 AM

## 2011-10-17 NOTE — Progress Notes (Signed)
Pt has been pacing in [place] with flat/sad affect. Pt has mild body odor and attends groups when prompted. Encouraged pt to shower. Gave medications scheduled and prn as ordered. Pt tolerating medications well. Pt is followed by internal medicine and is scheduled to see pt this afternoon. Safety maintained on unit.

## 2011-10-17 NOTE — Therapy (Signed)
Psychoeducational Group Note  Date:  10/17/2011 Time:  2030  Group Topic/Focus:  Wrap-Up Group:   The focus of this group is to help patients review their daily goal of treatment and discuss progress on daily workbooks.  Participation Level:  Did Not Attend  Participation Quality:  N/A Did not attend group  Affect:    Cognitive:    Insight:    Engagement in Group:    Additional Comments: Patient did not attend wrap-up group this evening. Patient was awake in bed for the duration of wrap-up group time this evening.  Wakisha Alberts, Newton Pigg 10/17/2011, 11:37 PM

## 2011-10-17 NOTE — Progress Notes (Signed)
[facility] Group Notes:  (Counselor/Nursing/MHT/Case Management/Adjunct)  717/2013 2:00 PM  Type of Therapy:  [employer] Support  Participation Level:  Did Not attend.  Pt was pacing in the [place].    Marni Griffon C 10/17/2011, 2:00 PM

## 2011-10-17 NOTE — Progress Notes (Signed)
MEDICATION RELATED CONSULT NOTE - FOLLOW UP   Pharmacy Consult for Coumadin Indication: PE  Allergies  Allergen Reactions  . Darvocet (Propoxyphene-Acetaminophen)     Unknown     Patient Measurements: Height: 5\' 6"  (167.6 cm) Weight: 173 lb (78.472 kg) IBW/kg (Calculated) : 59.3    Vital Signs: Temp: 98.4 F (36.9 C) (07/17 0608) Temp src: Oral (07/17 0608) BP: 89/64 mmHg (07/17 0609) Pulse Rate: 114  (07/17 0609) Intake/Output from previous day:   Intake/Output from this shift:    Labs:  [facility] 10/17/11 0619  WBC 6.3  HGB 12.6  HCT 38.5  PLT 381  APTT --  CREATININE 0.96  LABCREA --  CREATININE 0.96  CREAT24HRUR --  MG --  PHOS --  ALBUMIN 3.5  PROT 7.0  ALBUMIN 3.5  AST 21  ALT 10  ALKPHOS 70  BILITOT 0.2*  BILIDIR --  IBILI --   Estimated Creatinine Clearance: 80.7 ml/min (by C-G formula based on Cr of 0.96).   Microbiology: No results found for this or any previous visit (from the past 720 hour(s)).  Medications:  Scheduled:    . diazepam  5 mg Oral [id number]  . nicotine  21 mg Transdermal Daily  . nitrofurantoin (macrocrystal-monohydrate)  100 mg Oral BID WC  . risperidone  4 mg Oral QHS  . warfarin  5 mg Oral ONCE-1800  . Warfarin - Pharmacist Dosing Inpatient   Does not apply q1800    Assessment: INR at goal.  No problems noted.  Goal of Therapy:  INR 2-3  Plan:  Coumadin 5 mg T-R-S-S and Coumadin 2.5 mg M-W-F F/U INR Friday am labs   [name] 10/17/2011,9:37 AM

## 2011-10-17 NOTE — Progress Notes (Signed)
I have seen Ms [name] in follow up for tachycardia. She states that medications to control heart rate mess up her coumadin. She asks that i do not change her macrobid, as she is afraid this may mess up her INR, which is difficult to regularize. i have ordered a urine culture in case the tachycardia is related to partially treated uti due to bacteria not sensitive to macrobid. I would follow the culture to ensure no need for different abx coverage, if she is discharged tomorrow. I will follow the urine culture result in am, otherwise she can d/c from medical standpoint, whenever she is ready per psychiatry..  Husein Guedes,MD [phone].

## 2011-10-17 NOTE — Progress Notes (Signed)
Psychoeducational Group Note  Date:  10/17/2011 Time:  1100  Group Topic/Focus:  Emotional Education:   The focus of this group is to discuss what feelings/emotions are, and how they are experienced.  Participation Level:  Did Not Attend  Participation Quality:    Affect:    Cognitive:   Insight:    Engagement in Group:    Additional Comments:  Pt refused to attend, pt laid in the bed.  Isla Pence M 10/17/2011, 1:13 PM

## 2011-10-18 MED ORDER — RISPERIDONE 4 MG PO TABS: 4.0000 mg | ORAL_TABLET | Freq: Every day | ORAL | Status: AC

## 2011-10-18 MED ORDER — HYDROCODONE-ACETAMINOPHEN 5-325 MG PO TABS: 1.5000 | ORAL_TABLET | Freq: Four times a day (QID) | ORAL | Status: AC | PRN

## 2011-10-18 MED ORDER — WARFARIN SODIUM 5 MG PO TABS: ORAL_TABLET | ORAL | Status: DC

## 2011-10-18 MED ORDER — DIAZEPAM 5 MG PO TABS: 5.0000 mg | ORAL_TABLET | ORAL | Status: AC

## 2011-10-18 MED ORDER — WARFARIN SODIUM 2.5 MG PO TABS: ORAL_TABLET | ORAL | Status: DC

## 2011-10-18 NOTE — Progress Notes (Signed)
[facility] Group Notes:  (Counselor/Nursing/MHT/Case Management/Adjunct)  10/18/2011  10:30  AM  Type of Therapy: Group Therapy   Participation Level: Minimal   Participation Quality: Limited  Affect: Blunted, Depresssed  Cognitive: oriented, alert   Insight: Poor  Engagement in Group: Limited  Modes of Intervention: Clarification, Education, Problem-solving, Socialization,  Encouragement and Support, Activity   Summary of Progress/Problems: Pt participated in group by listening attentively and self disclosing.  After a brief check-in, therapist introduced the topic of balance and lack of balance in the lives of patients.  Therapist encouraged patients to identify areas in their lives that are out of balance, and give personal examples.  Therapist guided patients in problem solving ways to get these areas back in balance.  Therapist prompted patients to to explore ways to keep a balance, including setting appropriate boundaries.   Therapist encouraged patients to explore ways to assertively make their needs known to significant others in their lives. Pt was minimally engaged in the process.  Patient actively participated in the Positive Affirmation Activity by giving and receiving positive affirmations.  Pt reported an elevation in mood as a result of this exercise. Therapist offered support and encouragement.  Some Progress noted.  Intervention effective.         Marni Griffon C 10/18/2011  10:30 AM

## 2011-10-18 NOTE — Tx Team (Signed)
Interdisciplinary Treatment Plan Update (Adult)  Date:  10/18/2011  Time Reviewed:  10:15AM-11:15AM  Progress in Treatment: Attending groups:  Yes Participating in groups:    Yes Taking medication as prescribed:    Yes Tolerating medication:   Yes Family/Significant other contact made:  No, refused consent to talk to her boyfriend Patient understands diagnosis:   Yes, fair insight, good judgment Discussing patient identified problems/goals with staff:   Yes Medical problems stabilized or resolved:   Yes Denies suicidal/homicidal ideation:  Denies Issues/concerns per patient self-inventory:   Wants help making decision about which of 3 people to move in with at discharge Other:    New problem(s) identified: Yes, Describe:  patient has now been offered 3 places to move at discharge, and she is afraid to make the decision herself because her past decisions have not worked out.  She wants help with this.  Reason for Continuation of Hospitalization: Anxiety Depression Medication stabilization Other; describe hopelessness; needs   Interventions implemented related to continuation of hospitalization:  Medication monitoring and adjustment, safety checks Q15 min., suicide risk assessment, group therapy, psychoeducation, collateral contact, aftercare planning, ongoing physician assessments, medication education - UNTIL DISCHARGE  Additional comments:  Not applicable  Estimated length of stay:  Discharge today  Discharge Plan:  Return to live with boyfriend (she hopes he will support her more, or she has already told him she will move out and find her own apartment).  Will follow up with primary care physician Tamera Punt, who is confirmed to be willing to write her prescriptions for psychiatric medications.  Will follow [facility] with [facility].  New goal(s):  Not applicable  Review of initial/current patient goals per problem list:   1.  Goal(s):   Medication stabilization   Met:  Yes  Target date:  By Discharge   As evidenced by:  Stable on current meds  2.  Goal(s):  Return sleep to normal pattern of 6+ hours nightly.   Met:  Yes  Target date:  By Discharge   As evidenced by:  States is sleeping well   3.  Goal(s):  Reduce delusions to baseline per patient report and collateral.   Met:  Yes  Target date:  By Discharge   As evidenced by:  No delusions per patient  4.  Goal(s):  Agree to follow-up at higher level due to repeated hospitalizations.   Met:  Yes  Target date:  By Discharge   As evidenced by:  Agrees to stay with the [facility] for at least 6 months.  Attendees: Patient:  [name]  10/18/2011 10:15AM-11:15AM  Family:     Physician:  Dr. Harvie Heck Readling 10/18/2011 10:15AM-11:15AM  Nursing:   Nestor Ramp, RN 10/18/2011 10:15AM -11:15AM   Case Manager:  Ambrose Mantle, LCSW 10/18/2011 10:15AM-11:15AM  Counselor:  Marni Griffon, LCAS 10/18/2011 10:15AM-11:15AM  Other:      Other:      Other:      Other:       Scribe for Treatment Team:   Sarina Ser, 10/18/2011, 10:15AM-11:15AM

## 2011-10-18 NOTE — Progress Notes (Signed)
Currently resting quietly in bed with eyes closed. Respirations are even and unlabored. No acute distress noted. Safety has been maintained with Q15 minute observation. Will continue current POC. 

## 2011-10-18 NOTE — Progress Notes (Signed)
[facility] Case Management Discharge Plan:  Will you be returning to the same living situation after discharge: Yes,  with ex boyfriend At discharge, do you have transportation home?:Yes,  with ex-boyfriend Do you have the ability to pay for your medications:Yes,  insurance and income  Interagency Information:     Release of information consent forms completed and in the chart;  Patient's signature needed at discharge.  Patient to Follow up at:  Follow-up Information    Follow up with [employer] Team on 10/19/2011. (Team member will visit before lunch)    Contact information:   [facility] [address] [place] Telephone:  [phone]       Schedule an appointment as soon as possible for a visit with Tamera Punt. (Provider will follow you for medical (Coumadin and other) needs, and has verified she agrees to prescribe psychiatric medications.)    Contact information:   [facility] [address] [place]  [address] Telephone:  [phone]         Patient denies SI/HI:   Yes,      Safety Planning and Suicide Prevention discussed:  Yes,  Yesterday, during Aftercare Planning Group, Case Manager provided psychoeducation on "Suicide Prevention Information."  This included descriptions of risk factors for suicide, warning signs that an individual is in crisis and thinking of suicide, and what to do if this occurs.  Pt indicated understanding of information provided, and will read brochure given upon discharge.     Barrier to discharge identified:No.  Summary and Recommendations:  Patient has agreed to stay with the [facility] for at least 6 months.  She will get her medication management for medical and psychiatric meds from her Primary Care Physician.   Sarina Ser 10/18/2011, 2:34 PM

## 2011-10-18 NOTE — Progress Notes (Signed)
Pt. Was discharged home today.  Her boyfriend  picked her up.  She denies any SI/HI or A/V hallucinations.  Discharge instructions, follow up appt.  and medications reviewed with patient with voiced understanding given.  Pt. received all belongings out of locker prior to leaving.
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
## 2011-10-19 LAB — URINE CULTURE: Colony Count: 30000

## 2011-10-19 NOTE — Progress Notes (Signed)
Patient Discharge Instructions:  After Visit Summary (AVS):   Faxed to:  10/19/2011 Psychiatric Admission Assessment Note:   Faxed to:  10/19/2011 Suicide Risk Assessment - Discharge Assessment:   Faxed to:  10/19/2011 Faxed/Sent to the Next Level Care provider:  10/19/2011  Faxed to [facility] - Tamera Punt @ [phone] And to [facility] @ [phone]  Wandra Scot, 10/19/2011, 6:42 PM
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
## 2012-08-22 IMAGING — CR DG CHEST 2V
2 series · 2 of 2 positions shown · non-contrast
Comparison: 09/07/2010

CLINICAL DATA: Hypoxemia and short of breath

CHEST - 2 VIEW

[w chest pa]
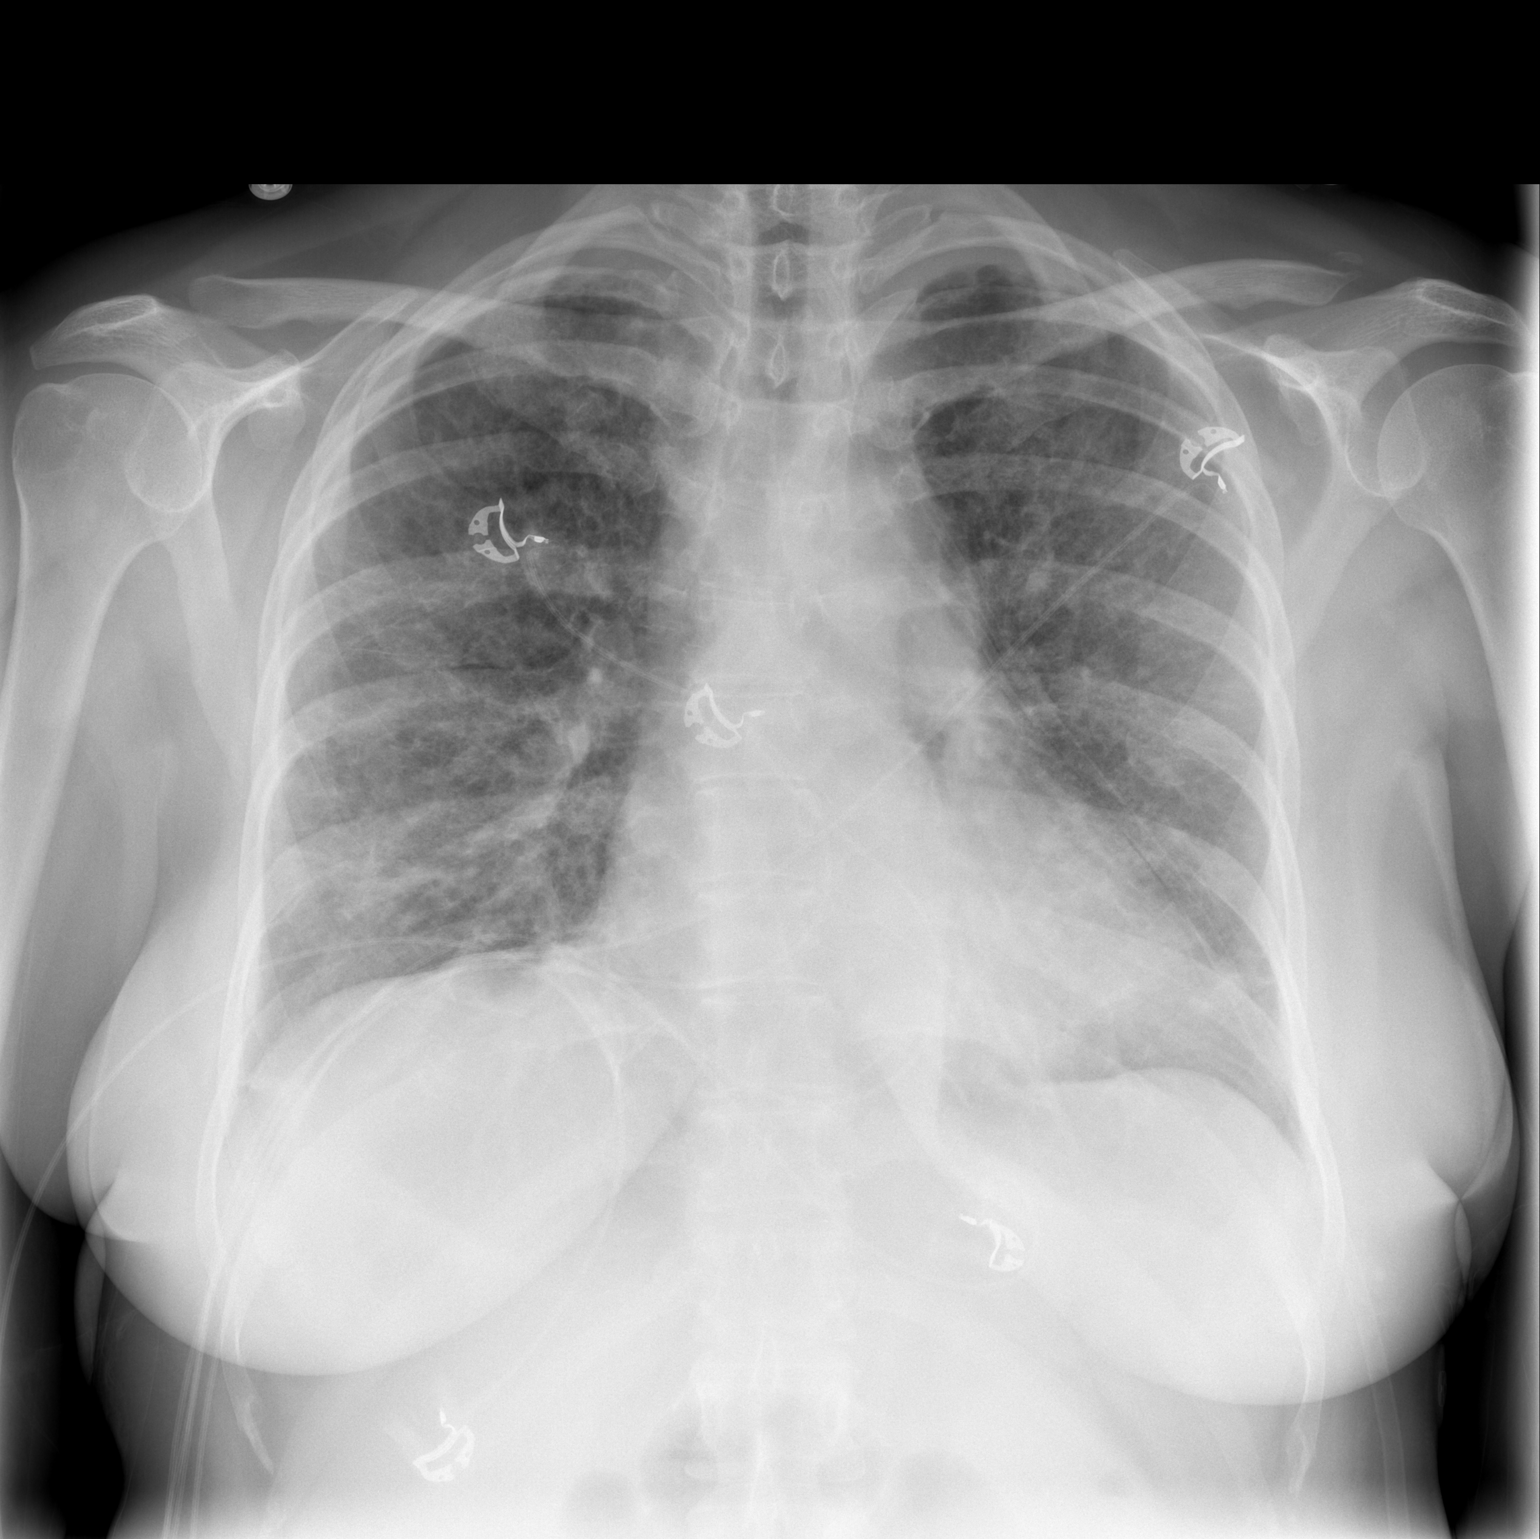

[w chest lat]
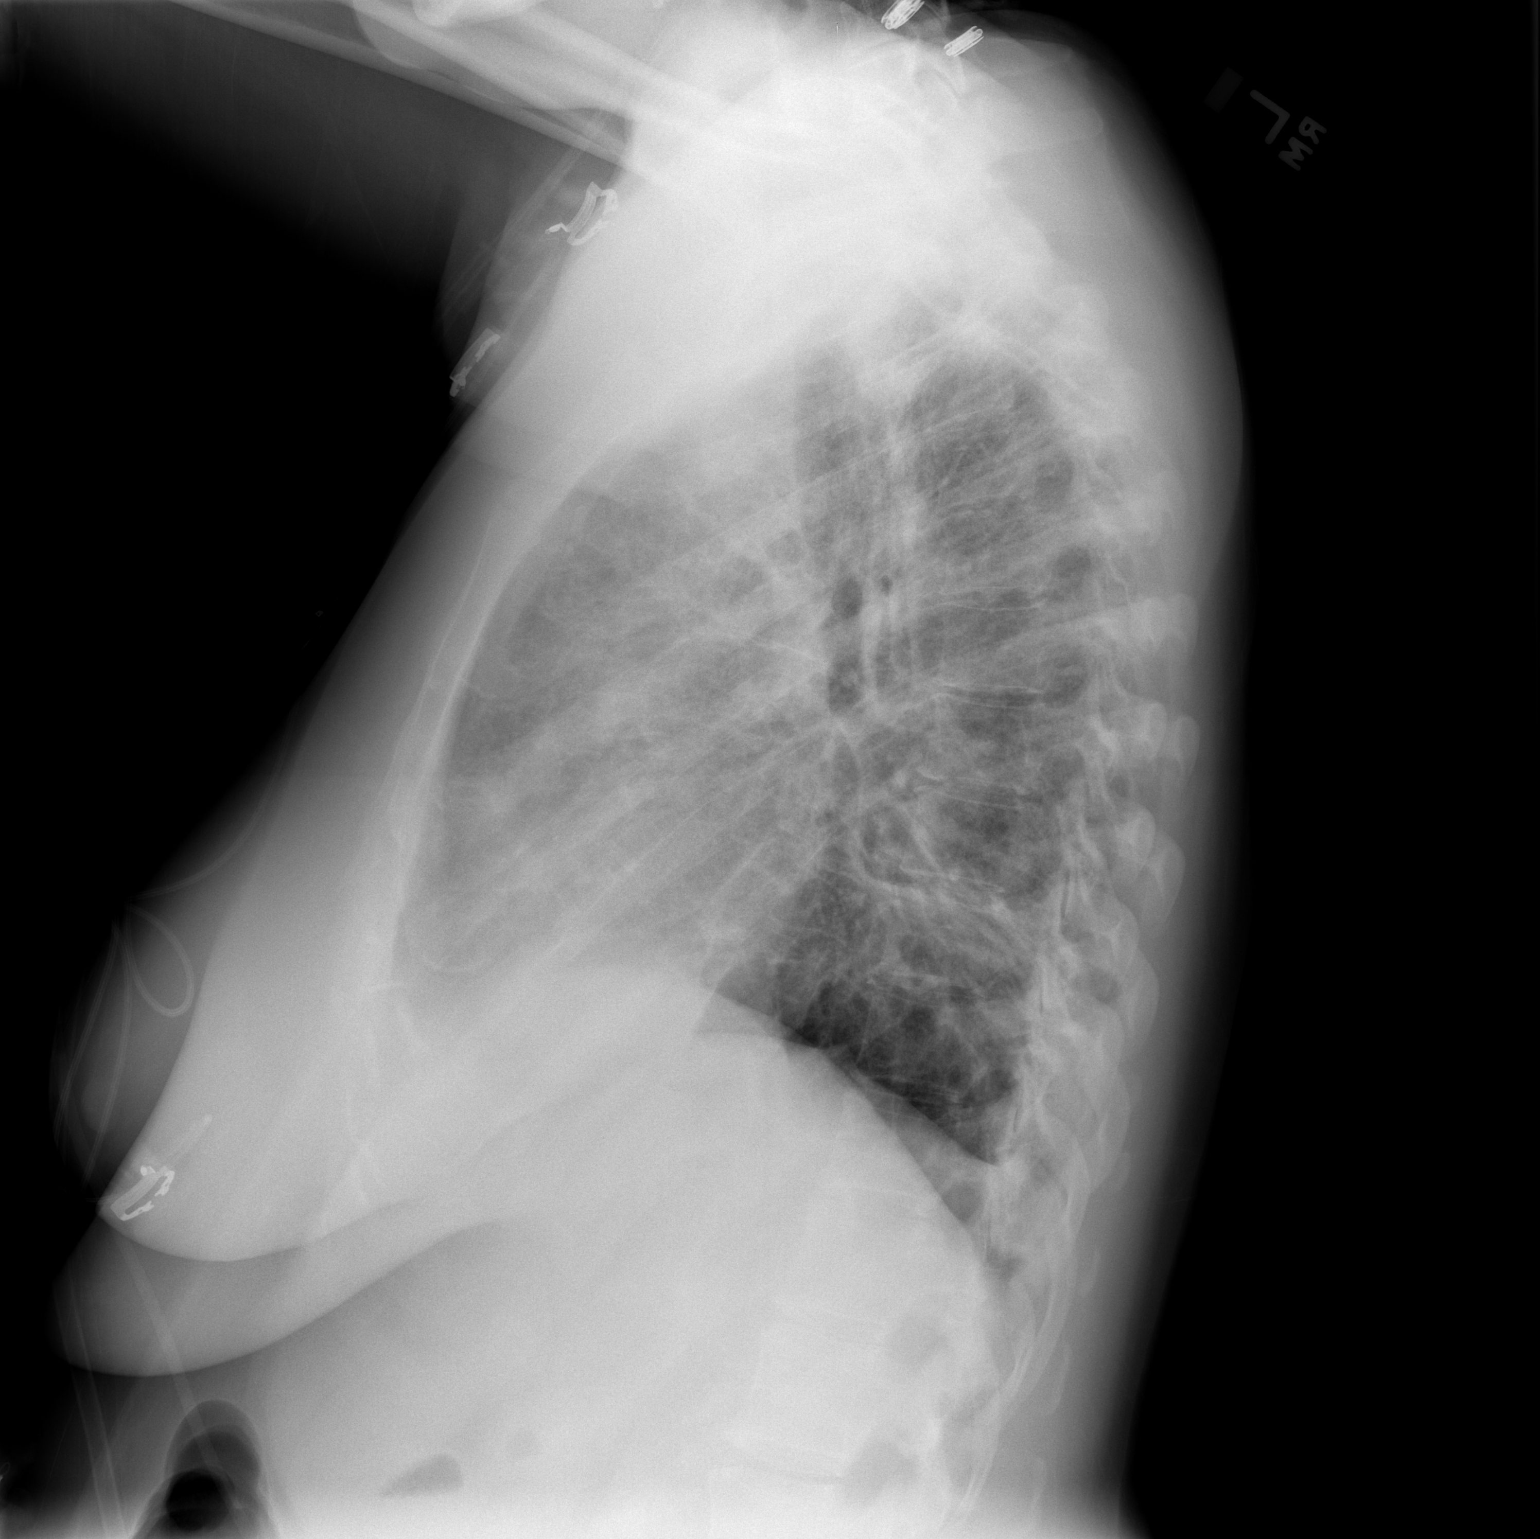

[2 of 2 positions shown; findings below may reference images not displayed]

FINDINGS: Since the previous study, the heart size and pulmonary
vascularity are increasing in there is a suggestion of developing
interstitial edema in the mid lungs. Changes suggest developing
congestive failure or fluid overload.  No significant pleural
effusion. No focal airspace consolidation.  No pneumothorax.
IMPRESSION: Increasing heart size and pulmonary vascularity with mild
interstitial edema suggesting congestive changes.

## 2012-08-23 IMAGING — CT CT ANGIO CHEST
2 of 10 series · 19 of 46 positions shown · IV contrast (APPLIED)
Comparison: None.

CLINICAL DATA: Shortness of breath.  Tachycardia.  Systemic lupus
erythematosus. Splenic and renal infarcts.

CT ANGIOGRAPHY CHEST WITH CONTRAST
TECHNIQUE: Multidetector CT imaging of the chest was performed
using the standard protocol during bolus administration of
intravenous contrast.  Multiplanar CT image reconstructions
including MIPs were obtained to evaluate the vascular anatomy.
Contrast:  70 ml Gmnipaque-YCC

[Series 8: pulm embolism 1.0 b25f thin · axial · 0.70mm/px · z∈[-188,+38]mm · 18 of 248 slices shown]
[im 11/248  lung]
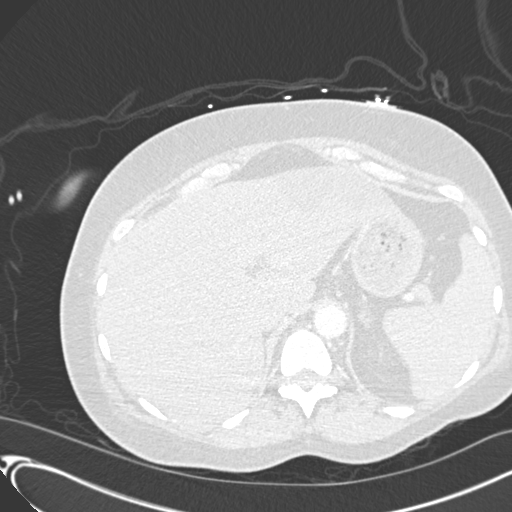
[im 22/248  soft-tissue]
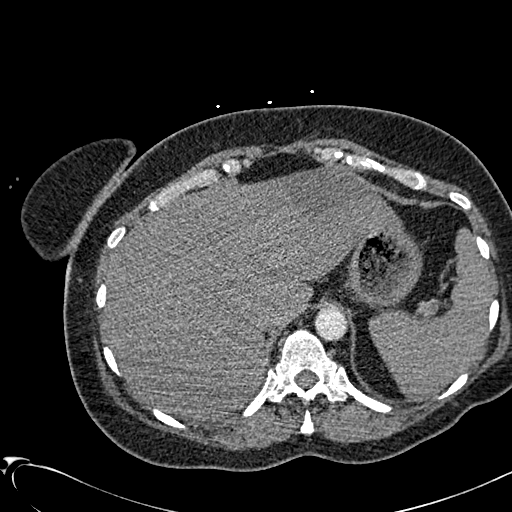
[im 43/248  lung]
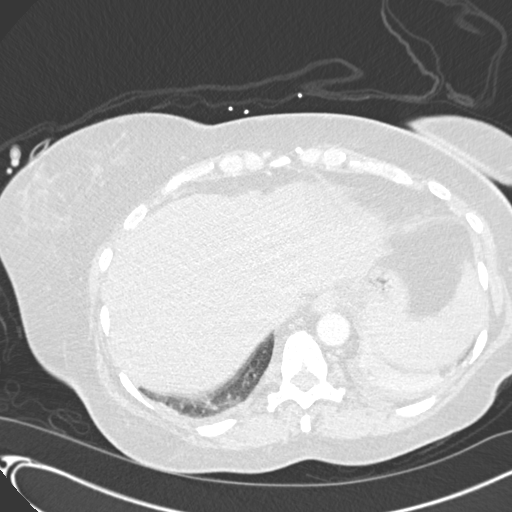
[im 54/248  soft-tissue]
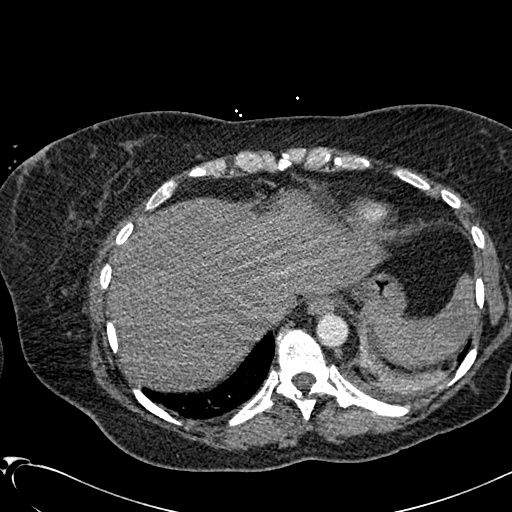
[im 65/248  lung]
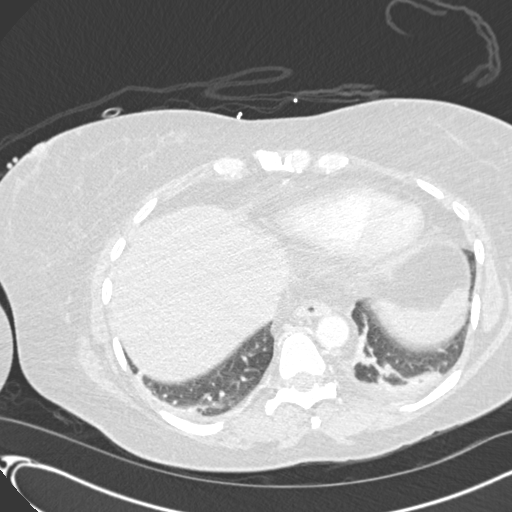
[im 76/248  soft-tissue]
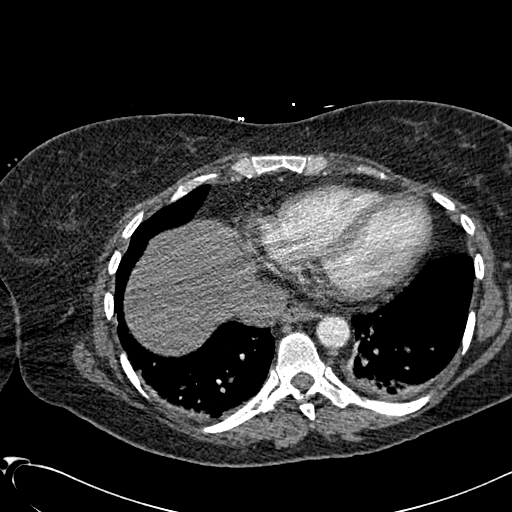
[im 86/248  lung]
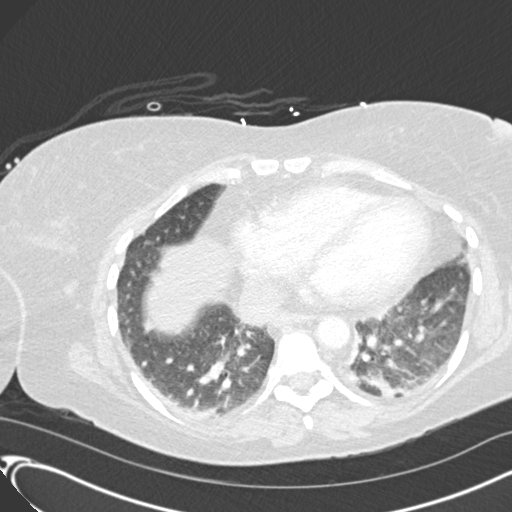
[im 108/248  soft-tissue]
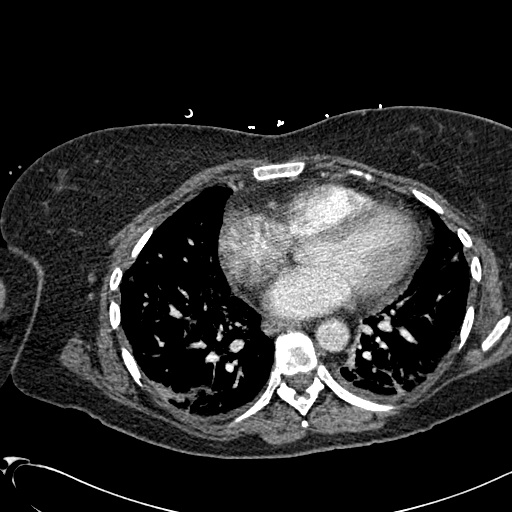
[im 119/248  lung]
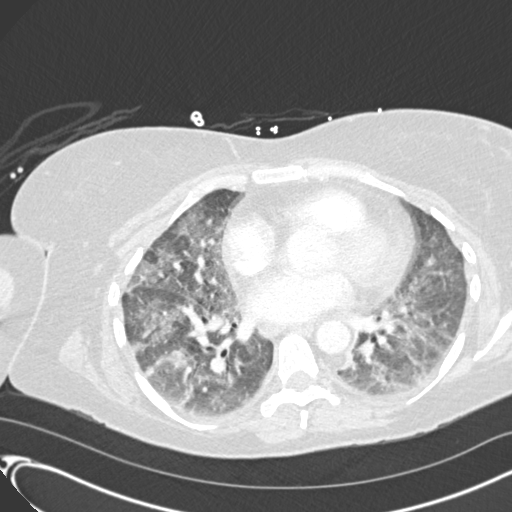
[im 129/248  soft-tissue]
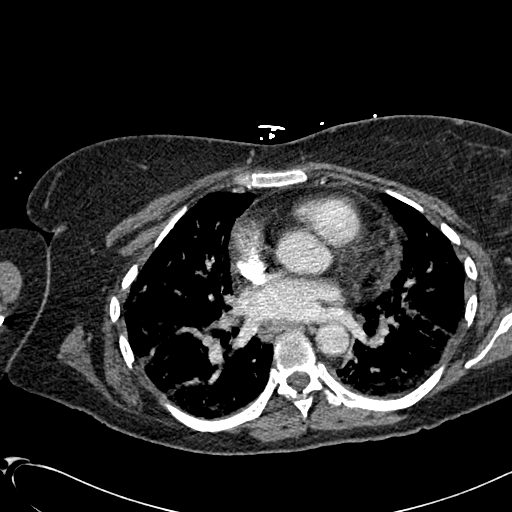
[im 140/248  lung]
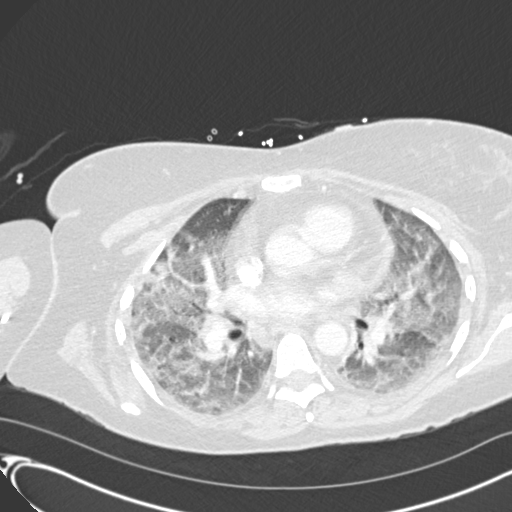
[im 162/248  soft-tissue]
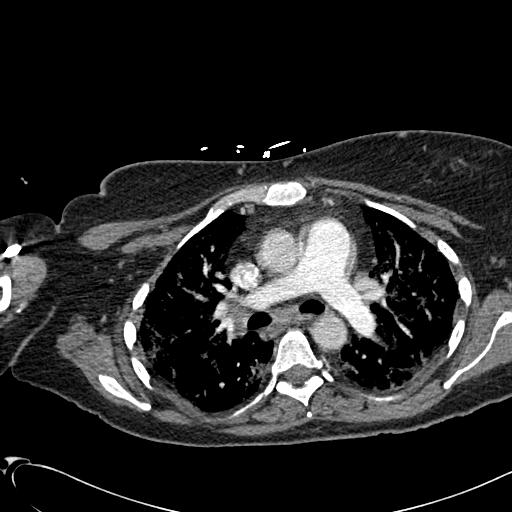
[im 172/248  lung]
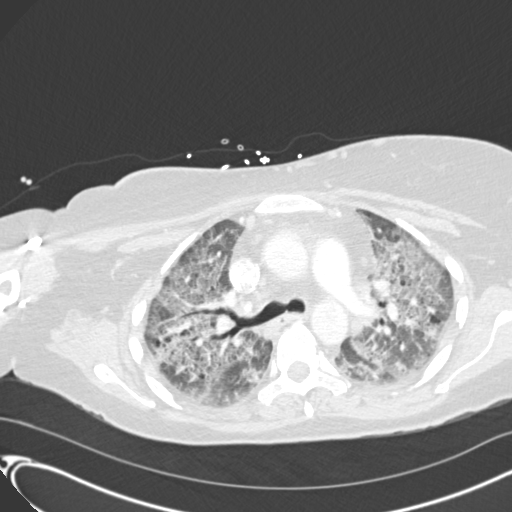
[im 183/248  soft-tissue]
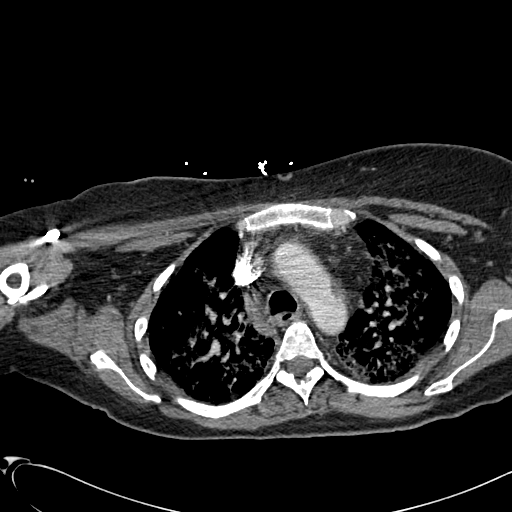
[im 194/248  lung]
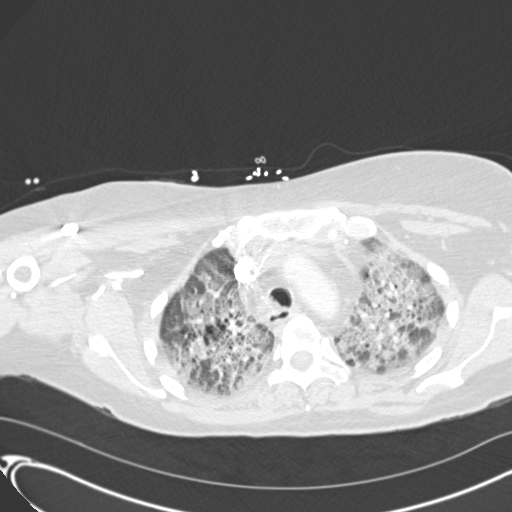
[im 205/248  soft-tissue]
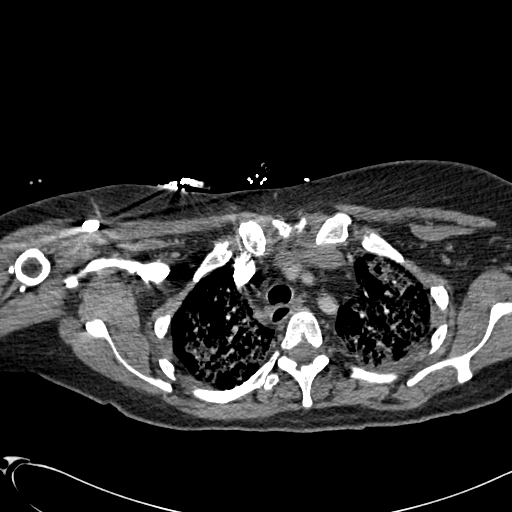
[im 226/248  lung]
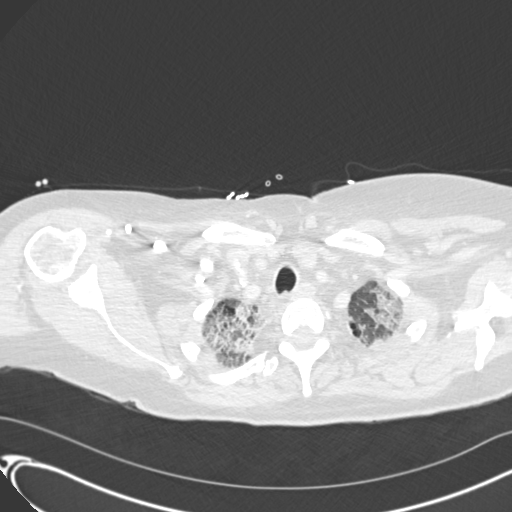
[im 237/248  soft-tissue]
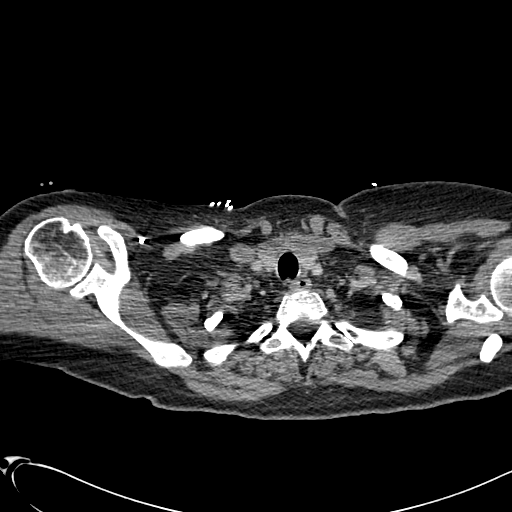

[Series 605: <mpr thick cor · coronal · 0.70mm/px · 1 of 101 slices shown]
[im 51/101  soft-tissue]
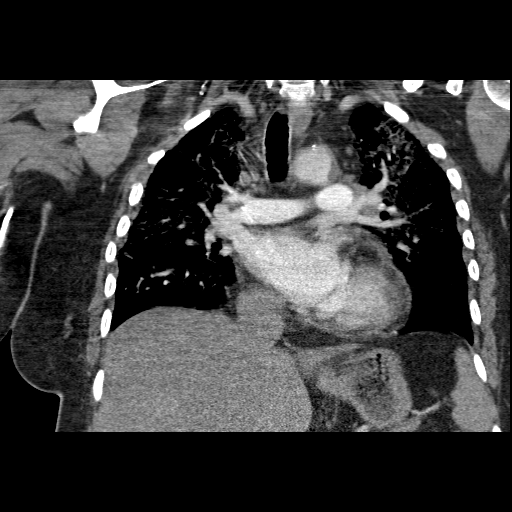

[19 of 46 positions shown; findings below may reference images not displayed]

FINDINGS: Satisfactory opacification of the pulmonary arteries
noted, and there is no evidence of pulmonary emboli. No evidence of
thoracic aortic aneurysm, however there is a small amount of
nonocclusive intraluminal thrombus is seen along the lateral wall
of the aortic arch and proximal descending aorta.

No evidence of mediastinal or hilar masses.  No evidence of
lymphadenopathy within the thorax.  No evidence of pericardial
effusion.  Tiny left pleural effusion is noted.  Diffuse bilateral
airspace disease is seen which is symmetric and suspicious for
diffuse pulmonary edema.

Images obtained through the upper abdomen show infarcts involving
the spleen and left hepatic lobe.

Review of the MIP images confirms the above findings.
IMPRESSION: 1.  No evidence of pulmonary embolism.
2.  Small amount of nonocclusive intraluminal thrombus along the
lateral wall of the aortic arch and proximal descending aorta.
3.  Diffuse bilateral pulmonary air space disease, suspicious for
diffuse pulmonary edema.
4. Splenic and left hepatic lobe infarcts.

Critical Value/emergent results were called by telephone at the
time of interpretation on 11/01/2010  at 1191 hours  to  Amy, the
patient's floor nurse, who verbally acknowledged these results.

## 2012-12-17 ENCOUNTER — Emergency Department (HOSPITAL_COMMUNITY): Payer: Medicaid Other

## 2012-12-17 ENCOUNTER — Encounter (HOSPITAL_COMMUNITY): Payer: Self-pay

## 2012-12-17 ENCOUNTER — Emergency Department (HOSPITAL_COMMUNITY)
Admission: EM | Admit: 2012-12-17 | Discharge: 2012-12-18 | Disposition: A | Discharge status: Other Acute Inpatient Hospital | Payer: Medicaid Other | Attending: Emergency Medicine | Admitting: Emergency Medicine

## 2012-12-17 DIAGNOSIS — Z8542 Personal history of malignant neoplasm of other parts of uterus: Secondary | ICD-10-CM | POA: Insufficient documentation

## 2012-12-17 DIAGNOSIS — Z86711 Personal history of pulmonary embolism: Secondary | ICD-10-CM | POA: Insufficient documentation

## 2012-12-17 DIAGNOSIS — R000 Tachycardia, unspecified: Secondary | ICD-10-CM | POA: Insufficient documentation

## 2012-12-17 DIAGNOSIS — R443 Hallucinations, unspecified: Secondary | ICD-10-CM | POA: Insufficient documentation

## 2012-12-17 DIAGNOSIS — F4310 Post-traumatic stress disorder, unspecified: Secondary | ICD-10-CM | POA: Insufficient documentation

## 2012-12-17 DIAGNOSIS — Z7901 Long term (current) use of anticoagulants: Secondary | ICD-10-CM | POA: Insufficient documentation

## 2012-12-17 DIAGNOSIS — Z3202 Encounter for pregnancy test, result negative: Secondary | ICD-10-CM | POA: Insufficient documentation

## 2012-12-17 DIAGNOSIS — F17200 Nicotine dependence, unspecified, uncomplicated: Secondary | ICD-10-CM | POA: Insufficient documentation

## 2012-12-17 DIAGNOSIS — F200 Paranoid schizophrenia: Secondary | ICD-10-CM | POA: Insufficient documentation

## 2012-12-17 LAB — COMPREHENSIVE METABOLIC PANEL
ALT: 29 U/L (ref 0–35)
AST: 81 U/L — ABNORMAL HIGH (ref 0–37)
Albumin: 3.7 g/dL (ref 3.5–5.2)
CO2: 25 mEq/L (ref 19–32)
Chloride: 94 mEq/L — ABNORMAL LOW (ref 96–112)
Creatinine, Ser: 1.17 mg/dL — ABNORMAL HIGH (ref 0.50–1.10)
GFR calc non Af Amer: 56 mL/min — ABNORMAL LOW (ref >90)
Potassium: 3.4 mEq/L — ABNORMAL LOW (ref 3.5–5.1)
Sodium: 134 mEq/L — ABNORMAL LOW (ref 135–145)
Total Bilirubin: 0.8 mg/dL (ref 0.3–1.2)

## 2012-12-17 LAB — CBC
MCV: 90.6 fL (ref 78.0–100.0)
Platelets: 315 10*3/uL (ref 150–400)
RBC: 5 MIL/uL (ref 3.87–5.11)
RDW: 16.1 % — ABNORMAL HIGH (ref 11.5–15.5)
WBC: 19.1 10*3/uL — ABNORMAL HIGH (ref 4.0–10.5)

## 2012-12-17 MED ORDER — NICOTINE 21 MG/24HR TD PT24
21.0000 mg | MEDICATED_PATCH | Freq: Every day | TRANSDERMAL | Status: DC
  Administered 2012-12-18: 21 mg via TRANSDERMAL
  Filled 2012-12-17: qty 1

## 2012-12-17 MED ORDER — ZOLPIDEM TARTRATE 5 MG PO TABS: 5.0000 mg | ORAL_TABLET | Freq: Every evening | ORAL | Status: DC | PRN

## 2012-12-17 MED ORDER — ALUM & MAG HYDROXIDE-SIMETH 200-200-20 MG/5ML PO SUSP: 30.0000 mL | ORAL | Status: DC | PRN

## 2012-12-17 MED ORDER — IBUPROFEN 200 MG PO TABS: 600.0000 mg | ORAL_TABLET | Freq: Three times a day (TID) | ORAL | Status: DC | PRN

## 2012-12-17 MED ORDER — LORAZEPAM 1 MG PO TABS: 1.0000 mg | ORAL_TABLET | Freq: Three times a day (TID) | ORAL | Status: DC | PRN

## 2012-12-17 MED ORDER — ONDANSETRON HCL 4 MG PO TABS: 4.0000 mg | ORAL_TABLET | Freq: Three times a day (TID) | ORAL | Status: DC | PRN

## 2012-12-17 NOTE — ED Provider Notes (Signed)
CSN: [id number]     Arrival date & time 12/17/12  2220 History   First MD Initiated Contact with Patient 12/17/12 2229     Chief Complaint  Patient presents with  . Medical Clearance   (Consider location/radiation/quality/duration/timing/severity/associated sxs/prior Treatment) HPI Comments: 42 year old female with a past medical history of anxiety, depression, schizophrenia and PE brought in to the emergency department by EMS after family noticed that she was "acting out". Per EMS, family states that patient has not been taking her medications and has been acting out. When EMS arrived, patient was hallucinating. No family present with patient at this time, she is actively hallucinating, cannot provide a history. Level V caveat due to psychiatric disorder.  The history is provided by the EMS personnel.    Past Medical History  Diagnosis Date  . Pulmonary emboli   . Schizophrenia   . Cancer   . Uterus cancer   . Anxiety   . Depression    Past Surgical History  Procedure Laterality Date  . No past surgeries    . Uterine fibroid surgery      uterine cancer   History reviewed. No pertinent family history. History  Substance Use Topics  . Smoking status: Current Every Day Smoker -- 3.00 packs/day for 15 years    Types: Cigarettes  . Smokeless tobacco: Never Used  . Alcohol Use: No   OB History   Grav Para Term Preterm Abortions TAB SAB Ect Mult Living                 Review of Systems  Unable to perform ROS: Psychiatric disorder    Allergies  Darvocet  Home Medications   Current Outpatient Rx  Name  Route  Sig  Dispense  Refill  . warfarin (COUMADIN) 2.5 MG tablet      Take one tablet by mouth on Monday, Wednesday and Fridays for blood thinner.   30 tablet   0   . warfarin (COUMADIN) 5 MG tablet      Take one tablet by mouth on Sunday, Tuesday, Thursday and Saturdays for blood thinner.   30 tablet   0    BP 130/77  Pulse 101  Temp(Src) 97.8 F (36.6 C)  (Oral)  Resp 16  SpO2 93% Physical Exam  Nursing note and vitals reviewed. Constitutional: She appears well-developed and well-nourished. No distress.  HENT:  Head: Normocephalic and atraumatic.  Mouth/Throat: Oropharynx is clear and moist.  Eyes: Conjunctivae and EOM are normal. Pupils are equal, round, and reactive to light.  Pupils dilated, reactive, equal bilateral.  Neck: Normal range of motion. Neck supple.  Cardiovascular: Regular rhythm, normal heart sounds and intact distal pulses.  Tachycardia present.   Mild tachycardia.  Pulmonary/Chest: Effort normal and breath sounds normal.  Scattered expiratory wheezes, minimal ronchi.  Abdominal: Soft. Bowel sounds are normal. She exhibits no distension. There is no tenderness.  Musculoskeletal: Normal range of motion. She exhibits no edema.  Neurological: She is alert.  Oriented to person, not place and time.  Skin: Skin is warm and dry. She is not diaphoretic.  Psychiatric: She is withdrawn and actively hallucinating.    ED Course  Procedures (including critical care time) Labs Review Labs Reviewed  CBC  COMPREHENSIVE METABOLIC PANEL  URINALYSIS, ROUTINE W REFLEX MICROSCOPIC  URINE RAPID DRUG SCREEN (HOSP PERFORMED)  ETHANOL  SALICYLATE LEVEL  ACETAMINOPHEN LEVEL   Imaging Review Dg Chest Portable 1 View  12/17/2012   CLINICAL DATA:  Uterine cancer. History of  pulmonary embolism. Medical clearance.  EXAM: PORTABLE CHEST - 1 VIEW  COMPARISON:  CHEST x-ray 08/26/2011.  FINDINGS: Small amount of linear scarring in the inferior aspect of the left hemithorax laterally. This is unchanged. Lung volumes are normal. No consolidative airspace disease. No pleural effusions. No pneumothorax. No pulmonary nodule or mass noted. Pulmonary vasculature and the cardiomediastinal silhouette are within normal limits.  IMPRESSION: 1.  No radiographic evidence of acute cardiopulmonary disease.   Electronically Signed   By: Trudie Reed M.D.    On: 12/17/2012 23:52    MDM   1. Post traumatic stress disorder (PTSD)   2. Schizophrenia, paranoid     Patient hallucinating, "acting out" per family. Patient trying to grab my arm during the examination, told the nurses that there are demons. Psych labs pending. CXR ordered due to scattered wheezes, minimal ronchi, patient is a smoker. TTS consult appreciated, I spoke with Marchelle Folks who will evaluate patient. Sitter at bedside.   Trevor Mace, PA-C 12/18/12 1152

## 2012-12-17 NOTE — ED Notes (Addendum)
Pt. And belongings searched by security. Pt. In new blue scrubs. Pt. Has 1 pair sandals and silk gown. Pt. Has 1 belongings bag. Pt. Belongings locked up in locker # 30.

## 2012-12-17 NOTE — ED Notes (Signed)
Family states that pt has not been taking her meds and been acting out. Last occurrence family states her electrolytes were abnormal. On scene pt was alert and hallucinating. No SI/HI.

## 2012-12-17 NOTE — ED Notes (Signed)
Pt. Made aware for the need of urine. 

## 2012-12-17 NOTE — ED Notes (Signed)
Bed: ZO10 Expected date: 12/17/12 Expected time: 10:21 PM Means of arrival: Ambulance Comments: Ams, electrolyte imbalance

## 2012-12-18 ENCOUNTER — Encounter (HOSPITAL_COMMUNITY): Payer: Self-pay | Admitting: *Deleted

## 2012-12-18 ENCOUNTER — Encounter (HOSPITAL_COMMUNITY): Payer: Self-pay | Admitting: Registered Nurse

## 2012-12-18 ENCOUNTER — Inpatient Hospital Stay (HOSPITAL_COMMUNITY)
Admission: AD | Admit: 2012-12-18 | Discharge: 2012-12-22 | DRG: 885 | Disposition: A | Discharge status: Other Acute Inpatient Hospital | Payer: Medicaid Other | Source: Intra-hospital | Attending: Psychiatry | Admitting: Psychiatry

## 2012-12-18 DIAGNOSIS — F4310 Post-traumatic stress disorder, unspecified: Secondary | ICD-10-CM | POA: Diagnosis present

## 2012-12-18 DIAGNOSIS — F200 Paranoid schizophrenia: Secondary | ICD-10-CM | POA: Diagnosis present

## 2012-12-18 DIAGNOSIS — F209 Schizophrenia, unspecified: Secondary | ICD-10-CM

## 2012-12-18 LAB — URINALYSIS, ROUTINE W REFLEX MICROSCOPIC
Glucose, UA: NEGATIVE mg/dL
Protein, ur: 100 mg/dL — AB

## 2012-12-18 LAB — RAPID URINE DRUG SCREEN, HOSP PERFORMED
Amphetamines: NOT DETECTED
Barbiturates: NOT DETECTED
Cocaine: NOT DETECTED
Opiates: NOT DETECTED
Tetrahydrocannabinol: POSITIVE — AB

## 2012-12-18 LAB — PROTIME-INR: INR: 2.06 — ABNORMAL HIGH (ref 0.00–1.49)

## 2012-12-18 LAB — URINE MICROSCOPIC-ADD ON

## 2012-12-18 LAB — POCT PREGNANCY, URINE: Preg Test, Ur: NEGATIVE

## 2012-12-18 MED ORDER — TRAZODONE HCL 50 MG PO TABS
50.0000 mg | ORAL_TABLET | Freq: Every evening | ORAL | Status: DC | PRN
  Administered 2012-12-22: 50 mg via ORAL
  Filled 2012-12-18 (×3): qty 1

## 2012-12-18 MED ORDER — WARFARIN SODIUM 5 MG PO TABS
5.0000 mg | ORAL_TABLET | Freq: Once | ORAL | Status: AC
  Administered 2012-12-18: 5 mg via ORAL
  Filled 2012-12-18 (×3): qty 1

## 2012-12-18 MED ORDER — LURASIDONE HCL 40 MG PO TABS
40.0000 mg | ORAL_TABLET | Freq: Every day | ORAL | Status: DC
  Administered 2012-12-19 – 2012-12-21 (×3): 40 mg via ORAL
  Filled 2012-12-18 (×5): qty 1

## 2012-12-18 MED ORDER — WARFARIN - PHARMACIST DOSING INPATIENT
Freq: Every day | Status: DC
  Filled 2012-12-18 (×9): qty 1

## 2012-12-18 MED ORDER — MAGNESIUM HYDROXIDE 400 MG/5ML PO SUSP: 30.0000 mL | Freq: Every day | ORAL | Status: DC | PRN

## 2012-12-18 MED ORDER — ALUM & MAG HYDROXIDE-SIMETH 200-200-20 MG/5ML PO SUSP
30.0000 mL | ORAL | Status: DC | PRN
  Filled 2012-12-18: qty 30

## 2012-12-18 MED ORDER — WARFARIN SODIUM 5 MG PO TABS
5.0000 mg | ORAL_TABLET | Freq: Once | ORAL | Status: DC
  Filled 2012-12-18: qty 1

## 2012-12-18 MED ORDER — WARFARIN - PHARMACIST DOSING INPATIENT: Freq: Every day | Status: DC

## 2012-12-18 MED ORDER — LORAZEPAM 1 MG PO TABS
1.0000 mg | ORAL_TABLET | Freq: Three times a day (TID) | ORAL | Status: DC | PRN
  Administered 2012-12-19 – 2012-12-22 (×4): 1 mg via ORAL
  Filled 2012-12-18 (×4): qty 1

## 2012-12-18 MED ORDER — LURASIDONE HCL 40 MG PO TABS
40.0000 mg | ORAL_TABLET | Freq: Every day | ORAL | Status: DC
  Filled 2012-12-18: qty 1

## 2012-12-18 MED ORDER — NICOTINE 21 MG/24HR TD PT24
21.0000 mg | MEDICATED_PATCH | Freq: Every day | TRANSDERMAL | Status: DC
  Administered 2012-12-19 – 2012-12-21 (×3): 21 mg via TRANSDERMAL
  Filled 2012-12-18 (×7): qty 1

## 2012-12-18 MED ORDER — LURASIDONE HCL 40 MG PO TABS
40.0000 mg | ORAL_TABLET | Freq: Every day | ORAL | Status: DC
  Administered 2012-12-18: 40 mg via ORAL
  Filled 2012-12-18 (×2): qty 1

## 2012-12-18 MED ORDER — WARFARIN SODIUM 5 MG PO TABS: 5.0000 mg | ORAL_TABLET | Freq: Once | ORAL | Status: DC

## 2012-12-18 MED ORDER — MAGNESIUM HYDROXIDE 400 MG/5ML PO SUSP
30.0000 mL | Freq: Every day | ORAL | Status: DC | PRN
  Filled 2012-12-18: qty 30

## 2012-12-18 NOTE — ED Notes (Signed)
While obtaining urine specimen from pt via in and out pt repeatedly would reach down to floor and grab for something and bring it back into the bed with her. Asked pt was she ok pt would not respond. Pt allowed for specimen to be collected and tolerated well.

## 2012-12-18 NOTE — Progress Notes (Addendum)
D: Pt was in bed resting with eyes for the majority of the shift this evening. At one point, this writer was able to gently aroused her for a brief assessment. Pt denied any SI/HI/AVH. Pt reported that she was feeling tired and that she hasn't slept for 5 days. She denied any concerns that she wished for this writer to address at this time. A: Continued support and availability as needed was extended to this pt. Staff continue to monitor pt with q41min checks.  R: Pt receptive to treatment. Pt remains safe at this time.

## 2012-12-18 NOTE — Tx Team (Signed)
Initial Interdisciplinary Treatment Plan  PATIENT STRENGTHS: (choose at least two) Ability for insight Capable of independent living  PATIENT STRESSORS: Medication change or noncompliance   PROBLEM LIST: Problem List/Patient Goals Date to be addressed Date deferred Reason deferred Estimated date of resolution  psychosis 12/18/12                                                      DISCHARGE CRITERIA:  Ability to meet basic life and health needs Improved stabilization in mood, thinking, and/or behavior  PRELIMINARY DISCHARGE PLAN: Attend aftercare/continuing care group  PATIENT/FAMIILY INVOLVEMENT: This treatment plan has been presented to and reviewed with the patient, MARQUEL SPOTO, and/or family member, .  The patient and family have been given the opportunity to ask questions and make suggestions.  Hendy Brindle, [place] 12/18/2012, 6:08 PM

## 2012-12-18 NOTE — ED Notes (Signed)
Pt is seeing "many critters" outside her room and requested to stuff towels under her door

## 2012-12-18 NOTE — Progress Notes (Signed)
ANTICOAGULATION CONSULT NOTE - Initial Consult  Pharmacy Consult for Warfarin Indication: h/o PE  Allergies  Allergen Reactions  . Darvocet [Propoxyphene-Acetaminophen]     Unknown     Patient Measurements:     Vital Signs: Temp: 97.6 F (36.4 C) (09/18 1206) Temp src: Oral (09/18 1206) BP: 119/73 mmHg (09/18 1206) Pulse Rate: 60 (09/18 1206)  Labs:  Recent Labs  12/17/12 2250 12/18/12 1251  HGB 15.8*  --   HCT 45.3  --   PLT 315  --   LABPROT  --  22.6*  INR  --  2.06*  CREATININE 1.17*  --     The CrCl is unknown because both a height and weight (above a minimum accepted value) are required for this calculation.   Medical History: Past Medical History  Diagnosis Date  . Pulmonary emboli   . Schizophrenia   . Cancer   . Uterus cancer   . Anxiety   . Depression      Assessment: 68 yoF with PMH significant for anxiety, depression, schizophrenia and PE.  Pharmacy consulted to help manage her chronic warfarin therapy for her hx of PE.  Warfarin dose PTA recorded as 5 mg daily except takes 2.5 mg on MWF.  INR on admission therapeutic at 2.06  Unknown last dose (unable to interview patient d/t current pysch state), but has not yet had a dose today so will provide dose tonight.  Patient awaiting bed at [facility].  Goal of Therapy:  INR 2-3   Plan:  1.  Warfarin 5 mg po once today. 2.  Daily PT/INR.    Clance Boll 12/18/2012,1:18 PM

## 2012-12-18 NOTE — ED Provider Notes (Signed)
Medical screening examination/treatment/procedure(s) were performed by non-physician practitioner and as supervising physician I was immediately available for consultation/collaboration.   Glynn Octave, MD 12/18/12 [phone]

## 2012-12-18 NOTE — ED Provider Notes (Signed)
2:11 AM TTS assessment completed and recs 400 bed at [facility]  Sunnie Nielsen, MD 12/18/12 [phone]

## 2012-12-18 NOTE — ED Notes (Signed)
Pt became very fearful of computer for TTS consult when it was brought into the room. Pt came out of room and asked to be moved. Redirected pt to ensure her that we would remove the computer out of the room and that she could go back to her room. Once computer was removed from room pt went back to lay down. Explain to consultant that pt was anxious having computer in room. One on one visit scheduled for pt.
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
## 2012-12-18 NOTE — ED Notes (Signed)
Pt. Became agitated, walking out of the room because the psych machine was too noisy. Pt. States that the psych machine is causing her to see things on the floor and requesting to be moved to another room in the ed department.pt. Redirected back to her room and pt. calm after giving pt. A soda. RN, Carollee Herter made aware.

## 2012-12-18 NOTE — ED Notes (Signed)
Pt transferred from main ed, report from [employer], pt psychotic, rambling about the antichrist, grabbing at her vagina,  Positive AV hallucinations, pt reports she sees flashes of light, feels ticks on skin. Pt reports she has been diag. With PTSD, screaming about the TV, that she wants it taken out of her room.

## 2012-12-18 NOTE — Progress Notes (Signed)
Pt accepted to [facility] [address] to Dr. Jannifer Franklin. Pt to be transported voluntarily by security. CSW completed support paperwork and faxed to [name].   Catha Gosselin, LCSW [phone] .12/18/2012 1416pm

## 2012-12-18 NOTE — Progress Notes (Signed)
42 year old female admitted on voluntary basis. On admission, pt very bizarre with disorganized thoughts and tangential thinking. Pt stated that she is a psychic and she denied any hallucinations. Pt also stated that she was here because of the chief and that the chief said she could join the [facility] when she gets out of here. Pt unable to provide any other information other than that regarding her admission. Pt was somewhat guarded and irritable during admission process. Pt did state that she takes her coumadin as prescribed but also stated that she does not take her psych medications and has not done so for awhile now. Pt denied any SI and is able to contract for safety on the unit. Pt was oriented to the unit and safety maintained.

## 2012-12-19 DIAGNOSIS — F200 Paranoid schizophrenia: Secondary | ICD-10-CM

## 2012-12-19 LAB — PROTIME-INR
INR: 1.3 (ref 0.00–1.49)
Prothrombin Time: 15.9 seconds — ABNORMAL HIGH (ref 11.6–15.2)

## 2012-12-19 MED ORDER — WARFARIN SODIUM 5 MG PO TABS
5.0000 mg | ORAL_TABLET | Freq: Once | ORAL | Status: AC
  Administered 2012-12-19: 5 mg via ORAL
  Filled 2012-12-19: qty 1

## 2012-12-19 MED ORDER — OLANZAPINE 10 MG IM SOLR: 10.0000 mg | Freq: Once | INTRAMUSCULAR | Status: AC | PRN

## 2012-12-19 MED ORDER — OLANZAPINE 10 MG PO TBDP: 10.0000 mg | ORAL_TABLET | Freq: Once | ORAL | Status: AC | PRN

## 2012-12-19 MED ORDER — OLANZAPINE 10 MG PO TBDP: 10.0000 mg | ORAL_TABLET | Freq: Three times a day (TID) | ORAL | Status: DC | PRN
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
## 2012-12-19 NOTE — [facility] Group Notes (Signed)
[facility] LCSW Group Therapy  12/19/2012  1:05 PM  Type of Therapy:  Group therapy  Participation Level:  Did not attend    Summary of Progress/Problems:  Chaplain was here to lead a group on themes of hope and courage.  [name] B 12/19/2012 2:04 PM
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
## 2012-12-19 NOTE — Progress Notes (Signed)
Adult Psychoeducational Group Note  Date:  12/19/2012 Time:  2000  Group Topic/Focus:  Wrap-Up Group:   The focus of this group is to help patients review their daily goal of treatment and discuss progress on daily workbooks.  Participation Level:  Did Not Attend   Participation Quality:    Affect:    Cognitive:    Insight:   Engagement in Group:    Modes of Intervention:    Additional Comments:  Pt didn't attend wrap-up group this evening.   Keirstyn Aydt A 12/19/2012, 9:26 PM

## 2012-12-19 NOTE — [facility] Suicide Risk Assessment (Signed)
Suicide Risk Assessment  Admission Assessment     Nursing information obtained from:  Patient Demographic factors:  Caucasian;Low socioeconomic status;Unemployed Current Mental Status:  NA Loss Factors:  NA Historical Factors:  Family history of suicide Risk Reduction Factors:  Positive social support  CLINICAL FACTORS:   Schizophrenia:   Command hallucinatons Paranoid or undifferentiated type Currently Psychotic  COGNITIVE FEATURES THAT CONTRIBUTE TO RISK:  Closed-mindedness Polarized thinking    SUICIDE RISK:   Minimal: No identifiable suicidal ideation.  Patients presenting with no risk factors but with morbid ruminations; may be classified as minimal risk based on the severity of the depressive symptoms  PLAN OF CARE:1. Admit for crisis management and stabilization. 2. Medication management to reduce current symptoms to base line and improve the     patient's overall level of functioning 3. Treat health problems as indicated. 4. Develop treatment plan to decrease risk of relapse upon discharge and the need for     readmission. 5. Psycho-social education regarding relapse prevention and self care. 6. Health care follow up as needed for medical problems. 7. Restart home medications where appropriate.   I certify that inpatient services furnished can reasonably be expected to improve the patient's condition.  Marizol Borror,MD 12/19/2012, 10:44 AM

## 2012-12-19 NOTE — Progress Notes (Signed)
ANTICOAGULATION CONSULT NOTE - Follow Up Consult  Pharmacy Consult for Coumadin  Indication: h/o pulmonary embolus  Allergies  Allergen Reactions  . Darvocet [Propoxyphene-Acetaminophen]     Unknown     Patient Measurements: Height: 5' 5.35" (166 cm) Weight: 158 lb (71.668 kg) IBW/kg (Calculated) : 57.81 Vital Signs: Temp: 97.4 F (36.3 C) (09/19 0700) Temp src: Oral (09/19 0700) BP: 121/83 mmHg (09/19 0704) Pulse Rate: 108 (09/19 0704)  Labs:  Recent Labs  12/17/12 2250 12/18/12 1251 12/19/12 0620  HGB 15.8*  --   --   HCT 45.3  --   --   PLT 315  --   --   LABPROT  --  22.6* 15.9*  INR  --  2.06* 1.30  CREATININE 1.17*  --   --     Estimated Creatinine Clearance: 62.1 ml/min (by C-G formula based on Cr of 1.17).   Medications:  Scheduled:  . lurasidone  40 mg Oral Q breakfast  . nicotine  21 mg Transdermal Daily  . warfarin  5 mg Oral ONCE-1800  . Warfarin - Pharmacist Dosing Inpatient   Does not apply q1800    Assessment: INR at low end of goal upon admission.  INR this am dropped to 1.3.  No problems noted with therapy.  Patient said dose was missed evening prior to admission.   Goal of Therapy:  INR 2-3    Plan:  Coumadin 5 mg PO x 1 today at 1800 PT/INR in am  F/u in am   [name] 12/19/2012,8:27 AM

## 2012-12-19 NOTE — Progress Notes (Signed)
Recreation Therapy Notes  Date: 09.19.2014 Time: 9:30am Location: [address]  Group Topic: Leisure Education  Goal Area(s) Addresses:  Patient will verbalize activity of interest by end of group session. Patient will verbalize the ability to use positive leisure/recreation as a coping mechanism.  Behavioral Response: Bizarre  Intervention: Air traffic controller  Activity: Leisure Skills Checklist. Patients were asked to take a survey of their leisure lifestyle. Using the survey patients were able to identify how they view leisure and what type of leisure they prefer.   Education:  Leisure Education, Building control surveyor  Education Outcome: Needs additional edcuation  Clinical Observations/Feedback: Patient arrived to group session late. As patient entered group session she asked for LRT by name, LRT introduced herself to patient. Patient then asked what everyone's name in the room was. Patient then proceeded to talk from group member to group member asking people name's and writing them down as she learned them.   Marykay Lex Travonte Byard, LRT/CTRS  Jearl Klinefelter 12/19/2012 12:51 PM

## 2012-12-20 MED ORDER — WARFARIN SODIUM 7.5 MG PO TABS
7.5000 mg | ORAL_TABLET | Freq: Once | ORAL | Status: AC
  Administered 2012-12-20: 7.5 mg via ORAL
  Filled 2012-12-20: qty 1

## 2012-12-20 MED ORDER — NAPROXEN 250 MG PO TABS
250.0000 mg | ORAL_TABLET | Freq: Two times a day (BID) | ORAL | Status: DC
  Administered 2012-12-21 (×3): 250 mg via ORAL
  Filled 2012-12-20 (×7): qty 1

## 2012-12-20 NOTE — Progress Notes (Signed)
D: Patient is pleasant but seems preoccupied. Patient reporting visual hallucinations and seems to be responding to internal stimuli. Pt talks about religious delusions and "antichrist" quite frequently.  A: Support given. Verbalization encouraged. Pt encouraged to come to staff about any concerns. R: Pt is receptive. Will continue to monitor pt. Q15 min checks maintained for safety.

## 2012-12-20 NOTE — Progress Notes (Signed)
ANTICOAGULATION CONSULT NOTE - Follow Up Consult  Pharmacy Consult for Coumadin Indication: H/O pe  Allergies  Allergen Reactions  . Darvocet [Propoxyphene-Acetaminophen]     Unknown     Patient Measurements: Height: 5' 5.35" (166 cm) Weight: 158 lb (71.668 kg) IBW/kg (Calculated) : 57.81   Labs:  Recent Labs  12/17/12 2250 12/18/12 1251 12/19/12 0620 12/20/12 0700  HGB 15.8*  --   --   --   HCT 45.3  --   --   --   PLT 315  --   --   --   LABPROT  --  22.6* 15.9* 14.3  INR  --  2.06* 1.30 1.13  CREATININE 1.17*  --   --   --     Estimated Creatinine Clearance: 62.1 ml/min (by C-G formula based on Cr of 1.17).   Medications:  Scheduled:  . lurasidone  40 mg Oral Q breakfast  . nicotine  21 mg Transdermal Daily  . warfarin  7.5 mg Oral ONCE-1800  . Warfarin - Pharmacist Dosing Inpatient   Does not apply q1800    Assessment: INR decreased again to day to 1.13.  Patient nervous and upset regarding INR and Coumadin.    Goal of Therapy:  INR 2-3    Plan:  Coumadin 7.5 mg x 1 today.  INR in am.  Will f/u with AM labs.  [name] 12/20/2012,8:20 AM

## 2012-12-20 NOTE — Progress Notes (Addendum)
Pt has been seen in the [facility] a large portion of the day today. She has been attending groups and has been going to meals. Pt presently is asleep in her bed . Pt is cooperative and denies SI or HI. Pt intermittently acts like she has a mobiltiy issue and this is very inconsistent. While in the cafeteria she acted like she was going to fall and then stood upright and was steady on both feet. The techs did get a wheelchair for her and brought her back to the unit. PA made aware. Pt states her depression is a 1/10 and her hopelessness a 1/10. Pt stated," I am going to leave [facility] and travel the world five ttimes with my grandfather

## 2012-12-20 NOTE — Progress Notes (Signed)
Patient ID: [name], female   DOB: Aug 01, 1970, 42 y.o.   MRN: [id number]  D: Patient pleasant on approach tonight. Conversation disorganized at times and hard to follow. Patient paranoid and preoccupied about coumadin and potassium level. Hard to make sense of exactly what she is talking about at times. Did say " I might be dead by the morning" when anxious about her coumadin but calmed down A: Explained that they would draw blood on her in the am and given some gatorade to help with her potassium. Offered medication to help relax her so she could sleep but refused. R: Patient receptive to this at present.

## 2012-12-20 NOTE — Progress Notes (Signed)
[facility] MD Progress Note  12/20/2012 2:46 PM KAROLE OO  MRN:  [id number]  Subjective:  Patient is seen today. She is in bed, drowsy and lethargic. She is unable to answer questions due to her sleepiness. She has been up on the unit today and active in groups. There is some question about her mobility, and a report that she seemed unsteady while in the cafeteria, she was brought back to the unit in a wheel chair.  Diagnosis:   DSM5: Schizophrenia Disorders:   DSM5:  AXIS I: Chronic Paranoid Schizophrenia  AXIS II: Deferred  AXIS III:  Past Medical History   Diagnosis  Date   .  Pulmonary emboli    .  Schizophrenia    .  Cancer    .  Uterus cancer    .  Anxiety    .  Depression     AXIS IV: other psychosocial or environmental problems and problems related to social environment  AXIS V: 31-40 impairment in reality testing  ADL's:  Intact  Sleep: Fair  Appetite:  Fair  Suicidal Ideation:  denies Homicidal Ideation:  denies AEB (as evidenced by):  Psychiatric Specialty Exam: Review of Systems  Unable to perform ROS: acuity of condition    Blood pressure 137/98, pulse 101, temperature 97.5 F (36.4 C), temperature source Oral, resp. rate 18, height 5' 5.35" (1.66 m), weight 71.668 kg (158 lb), last menstrual period 11/16/2012.Body mass index is 26.01 kg/(m^2).  General Appearance: Disheveled  Eye Contact::  Poor  Speech:  patient fell back to sleep  Volume:  Normal  Mood:    Affect:    Thought Process:    Orientation:    Thought Content:    Suicidal Thoughts:    Homicidal Thoughts:    Memory:    Judgement:    Insight:    Psychomotor Activity:    Concentration:    Recall:    Akathisia:    Handed:    AIMS (if indicated):     Assets:    Sleep:  Number of Hours: 0.25   Current Medications: Current Facility-Administered Medications  Medication Dose Route Frequency Provider Last Rate Last Dose  . alum & mag hydroxide-simeth (MAALOX/MYLANTA) 200-200-20 MG/5ML  suspension 30 mL  30 mL Oral Q4H PRN Fransisca Kaufmann, NP      . LORazepam (ATIVAN) tablet 1 mg  1 mg Oral Q8H PRN Shuvon Rankin, NP   1 mg at 12/20/12 0425  . lurasidone (LATUDA) tablet 40 mg  40 mg Oral Q breakfast Mojeed Akintayo   40 mg at 12/20/12 0842  . magnesium hydroxide (MILK OF MAGNESIA) suspension 30 mL  30 mL Oral Daily PRN Fransisca Kaufmann, NP      . nicotine (NICODERM CQ - dosed in mg/24 hours) patch 21 mg  21 mg Transdermal Daily Shuvon Rankin, NP   21 mg at 12/20/12 0840  . traZODone (DESYREL) tablet 50 mg  50 mg Oral QHS PRN,MR X 1 Fransisca Kaufmann, NP      . warfarin (COUMADIN) tablet 7.5 mg  7.5 mg Oral ONCE-1800 Mojeed Akintayo      . Warfarin - Pharmacist Dosing Inpatient   Does not apply q1800 Mojeed Akintayo        Lab Results:  Results for orders placed during the hospital encounter of 12/18/12 (from the past 48 hour(s))  PROTIME-INR     Status: Abnormal   Collection Time    12/19/12  6:20 AM      Result Value  Range   Prothrombin Time 15.9 (*) 11.6 - 15.2 seconds   INR 1.30  0.00 - 1.49   Comment: Performed at [facility]  PROTIME-INR     Status: None   Collection Time    12/20/12  7:00 AM      Result Value Range   Prothrombin Time 14.3  11.6 - 15.2 seconds   INR 1.13  0.00 - 1.49   Comment: Performed at [facility]    Physical Findings: AIMS: Facial and Oral Movements Muscles of Facial Expression: None, normal Lips and Perioral Area: None, normal Jaw: None, normal Tongue: None, normal,Extremity Movements Upper (arms, wrists, hands, fingers): None, normal Lower (legs, knees, ankles, toes): None, normal, Trunk Movements Neck, shoulders, hips: None, normal, Overall Severity Severity of abnormal movements (highest score from questions above): None, normal Incapacitation due to abnormal movements: None, normal Patient's awareness of abnormal movements (rate only patient's report): No Awareness, Dental Status Current problems with  teeth and/or dentures?: Yes Does patient usually wear dentures?: No  CIWA:    COWS:     Treatment Plan Summary: Daily contact with patient to assess and evaluate symptoms and progress in treatment Medication management  Plan: 1. Continue crisis management and stabilization. 2. Medication management to reduce current symptoms to base line and improve patient's overall level of functioning 3. Treat health problems as indicated. 4. Develop treatment plan to decrease risk of relapse upon discharge and the need for readmission. 5. Psycho-social education regarding relapse prevention and self care. 6. Health care follow up as needed for medical problems. 7. Continue home medications where appropriate. 8.. Monitor for fall risk as syncopy is a side effect of Latuda. 9. ELOS: 3-4 days     Medical Decision Making Problem Points:  Established problem, stable/improving (1) Data Points:  Review or order medicine tests (1)  I certify that inpatient services furnished can reasonably be expected to improve the patient's condition.  Rona Ravens. Mashburn RPAC 2:55 PM 12/20/2012  Reviewed the information documented and agree with the treatment plan.  Laurelyn Terrero,JANARDHAHA R. 12/20/2012 3:18 PM

## 2012-12-20 NOTE — Progress Notes (Signed)
Adult Psychoeducational Group Note  Date:  12/20/2012 Time:  9:00 AM  Group Topic/Focus:  Coping With Mental Health Crisis:   The purpose of this group is to help patients identify strategies for coping with mental health crisis.  Group discusses possible causes of crisis and ways to manage them effectively.  Participation Level:  Did Not Attend  Additional Comments: Pt. was in the room during group.  Harold Barban E 12/20/2012, 12:24 PM

## 2012-12-20 NOTE — Progress Notes (Signed)
D: Patient cooperative with staff. Patient's affect/mood is anxious. Thoughts are disorganized and pt. is preoccupied with religion. Patient verbalized that she's wanting to be discharged today, but first needs to take her Warfarin medication before leaving. Writer informed patient that she's scheduled to take her Warfarin medication this evening at 6:00 pm. She reported on the self inventory sheet that she slept well last night, appetite and ability to pay attention are good and energy level is high. Patient rated depression and feelings of hopelessness "1". She did not attend morning group. Compliant with medication regimen.  A: Support and encouragement provided to patient. Monitor Q15 minute checks for safety.  R: Patient receptive. Denies SI/HI. Patient remains safe on the unit.

## 2012-12-20 NOTE — Progress Notes (Signed)
Psychoeducational Group Note  Date:  12/20/2012 Time:  2000  Group Topic/Focus:  Wrap-Up Group:   The focus of this group is to help patients review their daily goal of treatment and discuss progress on daily workbooks.  Participation Level: Did Not Attend  Participation Quality:  Not Applicable  Affect:  Not Applicable  Cognitive:  Not Applicable  Insight:  Not Applicable  Engagement in Group: Not Applicable  Additional Comments:  The patient did not attend group since she was asleep.   Hazle Coca S 12/20/2012, 8:51 PM

## 2012-12-20 NOTE — [facility] Group Notes (Signed)
[facility] Group Notes:  (Clinical Social Work)  12/20/2012  11:15-11:45AM  Summary of Progress/Problems:   The main focus of today's process group was for the patient to identify ways in which they have in the past sabotaged their own recovery and reasons they may have done this/what they received from doing it.  We then worked to identify a specific plan to avoid doing this when discharged from the hospital for this admission.  The patient expressed that she missed her Warfarin medication for 2 days, and said that is why she is in the hospital.  She was drowsy, but did nod agreement to several other people in group as they talked.  Type of Therapy:  Group Therapy - Process  Participation Level:  Active  Participation Quality:  Drowsy and Sharing  Affect:  Blunted  Cognitive:  Oriented  Insight:  Developing/Improving  Engagement in Therapy:  Developing/Improving  Modes of Intervention:  Clarification, Education, Exploration, Discussion  Joy Mantle, LCSW 12/20/2012, 12:22 PM

## 2012-12-21 LAB — PROTIME-INR: Prothrombin Time: 15.3 seconds — ABNORMAL HIGH (ref 11.6–15.2)

## 2012-12-21 MED ORDER — LURASIDONE HCL 40 MG PO TABS
60.0000 mg | ORAL_TABLET | Freq: Every day | ORAL | Status: DC
  Filled 2012-12-21: qty 2

## 2012-12-21 MED ORDER — WARFARIN SODIUM 5 MG PO TABS
5.0000 mg | ORAL_TABLET | Freq: Once | ORAL | Status: AC
  Administered 2012-12-21: 5 mg via ORAL
  Filled 2012-12-21 (×2): qty 1

## 2012-12-21 NOTE — [facility] Group Notes (Signed)
[facility] Group Notes:  (Nursing/MHT/Case Management/Adjunct)  Date:  12/21/2012  Time:  11:05 AM  Type of Therapy:  Psychoeducational Skills  Participation Level:  None  Participation Quality:  Inattentive  Affect:  Not Congruent  Cognitive:  Disorganized, Confused and Hallucinating  Insight:  None  Engagement in Group:  Lacking and Limited  Modes of Intervention:  Activity, Discussion, Education and Exploration  Summary of Progress/Problems: Video on anger reviewed and discussed coping with anger. Pt during video responding to internal stimuli waving hands in air and seeming to pick up objects that weren't there. She was noted to mumble to self and talk at times responding to internal stimuli. Could not participate in any discussion.  Malva Limes 12/21/2012, 11:05 AM

## 2012-12-21 NOTE — Progress Notes (Signed)
Patient ID: [name], female   DOB: [birth year], 42 y.o.   MRN: [id number] [facility] MD Progress Note  12/21/2012 1:30 PM [name]  MRN:  [id number]  Subjective:  Heli is awake today and attending groups. She states she is better today because she has been listening to good music.  Objective: She is responding to internal stimulation, and speaking to the evil spirit that is trying to attack this provider.She no longer appears unsteady on her feet.  DSM5: Schizophrenia Disorders:   DSM5:  AXIS I: Chronic Paranoid Schizophrenia  AXIS II: Deferred  AXIS III:  Past Medical History   Diagnosis  Date   .  Pulmonary emboli    .  Schizophrenia    .  Cancer    .  Uterus cancer    .  Anxiety    .  Depression     AXIS IV: other psychosocial or environmental problems and problems related to social environment  AXIS V: 31-40 impairment in reality testing  ADL's:  Intact  Sleep: Fair  Appetite:  Fair  Suicidal Ideation:  denies Homicidal Ideation:  denies AEB (as evidenced by):  Psychiatric Specialty Exam: Review of Systems  Unable to perform ROS: acuity of condition    Blood pressure 155/91, pulse 106, temperature 98.4 F (36.9 C), temperature source Oral, resp. rate 18, height 5' 5.35" (1.66 m), weight 71.668 kg (158 lb), last menstrual period 11/16/2012.Body mass index is 26.01 kg/(m^2).  General Appearance: Disheveled  Eye Contact::  fair  Speech:  alert  Volume:  Normal  Mood:    euthymic  Affect:    congruent  Thought Process:  disorganized  Orientation:  NA  Thought Content:  She is responding to internal stimulation  Suicidal Thoughts:  denies  Homicidal Thoughts:  denies  Memory:  fair  Judgement: poor   Insight:  lacking  Psychomotor Activity:  normal  Concentration:  poor  Recall:  poor  Akathisia:  none  Handed:    AIMS (if indicated):     Assets:    Sleep:  Number of Hours: 0   Current Medications: Current Facility-Administered Medications   Medication Dose Route Frequency Provider Last Rate Last Dose  . alum & mag hydroxide-simeth (MAALOX/MYLANTA) 200-200-20 MG/5ML suspension 30 mL  30 mL Oral Q4H PRN Fransisca Kaufmann, NP      . LORazepam (ATIVAN) tablet 1 mg  1 mg Oral Q8H PRN Shuvon Rankin, NP   1 mg at 12/20/12 0425  . lurasidone (LATUDA) tablet 40 mg  40 mg Oral Q breakfast Mojeed Akintayo   40 mg at 12/20/12 0842  . magnesium hydroxide (MILK OF MAGNESIA) suspension 30 mL  30 mL Oral Daily PRN Fransisca Kaufmann, NP      . nicotine (NICODERM CQ - dosed in mg/24 hours) patch 21 mg  21 mg Transdermal Daily Shuvon Rankin, NP   21 mg at 12/20/12 0840  . traZODone (DESYREL) tablet 50 mg  50 mg Oral QHS PRN,MR X 1 Fransisca Kaufmann, NP      . warfarin (COUMADIN) tablet 7.5 mg  7.5 mg Oral ONCE-1800 Mojeed Akintayo      . Warfarin - Pharmacist Dosing Inpatient   Does not apply q1800 Mojeed Akintayo        Lab Results:  Results for orders placed during the hospital encounter of 12/18/12 (from the past 48 hour(s))  PROTIME-INR     Status: None   Collection Time    12/20/12  7:00 AM  Result Value Range   Prothrombin Time 14.3  11.6 - 15.2 seconds   INR 1.13  0.00 - 1.49   Comment: Performed at [facility]    Physical Findings: AIMS: Facial and Oral Movements Muscles of Facial Expression: None, normal Lips and Perioral Area: None, normal Jaw: None, normal Tongue: None, normal,Extremity Movements Upper (arms, wrists, hands, fingers): None, normal Lower (legs, knees, ankles, toes): None, normal, Trunk Movements Neck, shoulders, hips: None, normal, Overall Severity Severity of abnormal movements (highest score from questions above): None, normal Incapacitation due to abnormal movements: None, normal Patient's awareness of abnormal movements (rate only patient's report): No Awareness, Dental Status Current problems with teeth and/or dentures?: Yes Does patient usually wear dentures?: No  CIWA:    COWS:     Treatment  Plan Summary: Daily contact with patient to assess and evaluate symptoms and progress in treatment Medication management  Plan: 1. Continue crisis management and stabilization. 2. Medication management to reduce current symptoms to base line and improve patient's overall level of functioning 3. Treat health problems as indicated. 4. Develop treatment plan to decrease risk of relapse upon discharge and the need for readmission. 5. Psycho-social education regarding relapse prevention and self care. 6. Health care follow up as needed for medical problems. 7. Continue home medications where appropriate. 8.. Monitor for fall risk as syncopy is a side effect of Latuda. 9. Increasing Latuda to 60mg  for improved response. Will d/c if no changes after next dose. 10. ELOS: 5 days   Medical Decision Making Problem Points:  Established problem, stable/improving (1) Data Points:  Review or order medicine tests (1)  I certify that inpatient services furnished can reasonably be expected to improve the patient's condition.   Rona Ravens. Mashburn RPAC 1:30 PM 12/21/2012  Reviewed the information documented and agree with the treatment plan.  Randie Bloodgood,JANARDHAHA R. 12/21/2012 3:53 PM

## 2012-12-21 NOTE — Progress Notes (Signed)
Pt ambulating down the [place] and tripped and fell to the floor. Pt apparently, while walking, tripped on her dress. Pt stated that her leg went out from under her which was why she fell to the floor. Pt vital signs were taken, pain assessed and at the time, pt only mentioned hitting her arm and said she had some pain. Pt had full range-of-motion with that particular arm. Pt stated that she did not hit her head and no head injury is suspected. Pt was escorted to her room and upon re-evaluation a few minutes later pt was in room laughing and talking to herself. When asked about her arm, she said it was fine and that she did not have any pain. Pt had full range-of-motion and no redness or swelling was evident at that time. PA notified of fall. Intervention done to help prevent another fall of this nature was to secure pt different clothing. Pt received a pair of capri pants and t-shirt.

## 2012-12-21 NOTE — [facility] Group Notes (Signed)
[facility] Group Notes:  (Clinical Social Work)  12/21/2012   11:15am-12:00pm  Summary of Progress/Problems:  The main focus of today's process group was to listen to a variety of genres of music and to identify that different types of music provoke different responses.  The patient then was able to identify personally what was soothing for them, as well as energizing.  Handouts were used to record feelings evoked, as well as how patient can personally use this knowledge in sleep habits, with depression, and with other symptoms.  The patient expressed how various music made her feel, but had to be called on each time rather than volunteering information.  She had some fairly continual posturing with hands.  Type of Therapy:  Music Therapy   Participation Level:  Minimal  Participation Quality:  Inattentive  Affect:  Blunted  Cognitive:  Disorganized  Insight:  Limited  Engagement in Therapy:  Limited  Modes of Intervention:   Activity, Exploration  Ambrose Mantle, LCSW 12/21/2012, 8:16 AM

## 2012-12-21 NOTE — Progress Notes (Signed)
ANTICOAGULATION CONSULT NOTE - Follow Up Consult  Pharmacy Consult for Warfarin Indication: H/o PE  Allergies  Allergen Reactions  . Darvocet [Propoxyphene-Acetaminophen]     Unknown     Patient Measurements: Height: 5' 5.35" (166 cm) Weight: 158 lb (71.668 kg) IBW/kg (Calculated) : 57.81   Labs:  Recent Labs  12/19/12 0620 12/20/12 0700  LABPROT 15.9* 14.3  INR 1.30 1.13    Estimated Creatinine Clearance: 62.1 ml/min (by C-G formula based on Cr of 1.17).  Medications:  Scheduled:  . [START ON 12/22/2012] lurasidone  60 mg Oral Q breakfast  . naproxen  250 mg Oral BID WC  . nicotine  21 mg Transdermal Daily  . Warfarin - Pharmacist Dosing Inpatient   Does not apply q1800    Assessment:  [name] on chronic wafarin for history of PE.  Home dose of warfarin is 2.5mg  on Monday, Wednesday, Friday and 5mg  on Sunday, Tuesday, Thursday, and Saturday.  INR yesterday decreased yesterday to 1.13, with boosted dose of warfarin 7.5mg  given.  No INR result today, despite lab order for daily INR.    Goal of Therapy:  INR 2-3    Plan:   Warfarin 5mg  PO tonight at 1800.    INR daily, f/u with AM labs.  Lynann Beaver PharmD, BCPS Pager [phone] 12/21/2012 4:28 PM

## 2012-12-21 NOTE — Progress Notes (Signed)
The focus of this group is to help patients review their daily goal of treatment and discuss progress on daily workbooks. Pt attended the evening group session and responded to all discussion prompts from the Writer. Pt shared that she had a good day on the unit because she was "getting done what I needed to be done," though Pt did not wish to specify what exactly that referred to. When asked about her goals for the coming week, Pt responded "I hope to get over with what I have to get over with," but again did not wish to expand upon that answer. Pt's affect was blunted and the Writer observed Pt talking to herself and laughing inappropriately while other Pt's spoke.

## 2012-12-21 NOTE — Progress Notes (Signed)
Patient ID: [name], female   DOB: [birth year], 42 y.o.   MRN: [id number] D:Patient came to the med window requesting her warfarin.  She stated, "my veins are collapsing; I need it now."  Explained to her that her warfarin is due at 1800 and that is the appropriate time. She took her naproxen and dropped her water on the floor stating, "it's seizing; it's seizing" referring to her knee. Patient has also been lurching down the hallway, unsteady on her feet.  She stated, "Im having a seizure in my knee."  Patient remains preoccupied and appears to be responding to internal stimuli.  She attended group and left early. Patient denies any SI/HI.  She denies any VH. A: Continue to monitor medication management and Md orders.  Safety checks completed every 15 minutes per protocol.  R:Patient is easily redirected.

## 2012-12-22 ENCOUNTER — Emergency Department (HOSPITAL_COMMUNITY): Payer: Medicaid Other

## 2012-12-22 ENCOUNTER — Encounter (HOSPITAL_COMMUNITY): Payer: Self-pay

## 2012-12-22 ENCOUNTER — Emergency Department (HOSPITAL_COMMUNITY)
Admission: EM | Admit: 2012-12-22 | Discharge: 2012-12-22 | Disposition: A | Discharge status: Home / Self Care | Payer: Medicaid Other | Attending: Emergency Medicine | Admitting: Emergency Medicine

## 2012-12-22 ENCOUNTER — Inpatient Hospital Stay (HOSPITAL_COMMUNITY)
Admission: AD | Admit: 2012-12-22 | Discharge: 2013-01-02 | Disposition: A | Discharge status: Home / Self Care | Payer: Medicaid Other | Source: Intra-hospital | Attending: Psychiatry | Admitting: Psychiatry

## 2012-12-22 DIAGNOSIS — Z7901 Long term (current) use of anticoagulants: Secondary | ICD-10-CM | POA: Insufficient documentation

## 2012-12-22 DIAGNOSIS — Z8659 Personal history of other mental and behavioral disorders: Secondary | ICD-10-CM | POA: Insufficient documentation

## 2012-12-22 DIAGNOSIS — Z79899 Other long term (current) drug therapy: Secondary | ICD-10-CM

## 2012-12-22 DIAGNOSIS — F259 Schizoaffective disorder, unspecified: Principal | ICD-10-CM | POA: Diagnosis present

## 2012-12-22 DIAGNOSIS — F17200 Nicotine dependence, unspecified, uncomplicated: Secondary | ICD-10-CM | POA: Insufficient documentation

## 2012-12-22 DIAGNOSIS — F1220 Cannabis dependence, uncomplicated: Secondary | ICD-10-CM | POA: Diagnosis present

## 2012-12-22 DIAGNOSIS — S0990XA Unspecified injury of head, initial encounter: Secondary | ICD-10-CM

## 2012-12-22 DIAGNOSIS — Y929 Unspecified place or not applicable: Secondary | ICD-10-CM | POA: Insufficient documentation

## 2012-12-22 DIAGNOSIS — W1809XA Striking against other object with subsequent fall, initial encounter: Secondary | ICD-10-CM | POA: Insufficient documentation

## 2012-12-22 DIAGNOSIS — Y939 Activity, unspecified: Secondary | ICD-10-CM | POA: Insufficient documentation

## 2012-12-22 DIAGNOSIS — F200 Paranoid schizophrenia: Secondary | ICD-10-CM

## 2012-12-22 DIAGNOSIS — F411 Generalized anxiety disorder: Secondary | ICD-10-CM | POA: Diagnosis present

## 2012-12-22 DIAGNOSIS — F209 Schizophrenia, unspecified: Secondary | ICD-10-CM

## 2012-12-22 DIAGNOSIS — Z86711 Personal history of pulmonary embolism: Secondary | ICD-10-CM | POA: Insufficient documentation

## 2012-12-22 DIAGNOSIS — F4310 Post-traumatic stress disorder, unspecified: Secondary | ICD-10-CM | POA: Diagnosis present

## 2012-12-22 DIAGNOSIS — F1210 Cannabis abuse, uncomplicated: Secondary | ICD-10-CM

## 2012-12-22 DIAGNOSIS — Z8542 Personal history of malignant neoplasm of other parts of uterus: Secondary | ICD-10-CM | POA: Insufficient documentation

## 2012-12-22 LAB — COMPREHENSIVE METABOLIC PANEL
ALT: 21 U/L (ref 0–35)
AST: 29 U/L (ref 0–37)
Alkaline Phosphatase: 73 U/L (ref 39–117)
CO2: 26 mEq/L (ref 19–32)
Calcium: 9.7 mg/dL (ref 8.4–10.5)
Chloride: 98 mEq/L (ref 96–112)
GFR calc Af Amer: >90 mL/min (ref >90)
GFR calc non Af Amer: 78 mL/min — ABNORMAL LOW (ref >90)
Glucose, Bld: 93 mg/dL (ref 70–99)
Sodium: 137 mEq/L (ref 135–145)
Total Bilirubin: 0.7 mg/dL (ref 0.3–1.2)

## 2012-12-22 LAB — POCT I-STAT, CHEM 8
BUN: 13 mg/dL (ref 6–23)
Calcium, Ion: 1.16 mmol/L (ref 1.12–1.23)
HCT: 47 % — ABNORMAL HIGH (ref 36.0–46.0)
Hemoglobin: 16 g/dL — ABNORMAL HIGH (ref 12.0–15.0)
Potassium: 3.3 mEq/L — ABNORMAL LOW (ref 3.5–5.1)
Sodium: 139 mEq/L (ref 135–145)
TCO2: 28 mmol/L (ref 0–100)

## 2012-12-22 LAB — PROTIME-INR
INR: 1.34 (ref 0.00–1.49)
INR: 1.32 (ref 0.00–1.49)
Prothrombin Time: 16.3 seconds — ABNORMAL HIGH (ref 11.6–15.2)

## 2012-12-22 LAB — CBC
HCT: 42 % (ref 36.0–46.0)
MCHC: 35.2 g/dL (ref 30.0–36.0)
Platelets: 309 10*3/uL (ref 150–400)
RDW: 16.1 % — ABNORMAL HIGH (ref 11.5–15.5)
WBC: 12.9 10*3/uL — ABNORMAL HIGH (ref 4.0–10.5)

## 2012-12-22 MED ORDER — WARFARIN SODIUM 5 MG PO TABS
5.0000 mg | ORAL_TABLET | Freq: Once | ORAL | Status: AC
  Administered 2012-12-22: 5 mg via ORAL
  Filled 2012-12-22 (×3): qty 1

## 2012-12-22 MED ORDER — LORAZEPAM 1 MG PO TABS
1.0000 mg | ORAL_TABLET | Freq: Three times a day (TID) | ORAL | Status: DC | PRN
  Administered 2012-12-22: 1 mg via ORAL
  Filled 2012-12-22 (×2): qty 1

## 2012-12-22 MED ORDER — TRAZODONE HCL 50 MG PO TABS
50.0000 mg | ORAL_TABLET | Freq: Every evening | ORAL | Status: DC | PRN
  Filled 2012-12-22 (×2): qty 1

## 2012-12-22 MED ORDER — OXCARBAZEPINE 300 MG PO TABS
300.0000 mg | ORAL_TABLET | Freq: Two times a day (BID) | ORAL | Status: DC
  Administered 2012-12-22 – 2012-12-29 (×14): 300 mg via ORAL
  Filled 2012-12-22 (×18): qty 1

## 2012-12-22 MED ORDER — WARFARIN SODIUM 5 MG PO TABS: 5.0000 mg | ORAL_TABLET | Freq: Every day | ORAL | Status: DC

## 2012-12-22 MED ORDER — ALUM & MAG HYDROXIDE-SIMETH 200-200-20 MG/5ML PO SUSP: 30.0000 mL | ORAL | Status: DC | PRN

## 2012-12-22 MED ORDER — LURASIDONE HCL 40 MG PO TABS
60.0000 mg | ORAL_TABLET | Freq: Every day | ORAL | Status: DC
  Administered 2012-12-22: 60 mg via ORAL
  Filled 2012-12-22 (×2): qty 2

## 2012-12-22 MED ORDER — MAGNESIUM HYDROXIDE 400 MG/5ML PO SUSP: 30.0000 mL | Freq: Every day | ORAL | Status: DC | PRN

## 2012-12-22 MED ORDER — ACETAMINOPHEN 325 MG PO TABS
650.0000 mg | ORAL_TABLET | Freq: Four times a day (QID) | ORAL | Status: DC | PRN
  Administered 2012-12-23 – 2012-12-31 (×3): 650 mg via ORAL

## 2012-12-22 MED ORDER — ENSURE COMPLETE PO LIQD
237.0000 mL | Freq: Two times a day (BID) | ORAL | Status: DC
  Administered 2012-12-22 – 2012-12-23 (×3): 237 mL via ORAL

## 2012-12-22 MED ORDER — WARFARIN SODIUM 2.5 MG PO TABS: 2.5000 mg | ORAL_TABLET | ORAL | Status: DC

## 2012-12-22 MED ORDER — ALUM & MAG HYDROXIDE-SIMETH 200-200-20 MG/5ML PO SUSP
30.0000 mL | ORAL | Status: DC | PRN
  Administered 2012-12-27: 30 mL via ORAL

## 2012-12-22 MED ORDER — FLUPHENAZINE HCL 2.5 MG PO TABS
2.5000 mg | ORAL_TABLET | Freq: Two times a day (BID) | ORAL | Status: DC
  Administered 2012-12-22 – 2012-12-23 (×2): 2.5 mg via ORAL
  Filled 2012-12-22 (×6): qty 1

## 2012-12-22 MED ORDER — NICOTINE 21 MG/24HR TD PT24
21.0000 mg | MEDICATED_PATCH | Freq: Every day | TRANSDERMAL | Status: DC
  Administered 2012-12-22 – 2013-01-02 (×12): 21 mg via TRANSDERMAL
  Filled 2012-12-22 (×13): qty 1

## 2012-12-22 MED ORDER — BENZTROPINE MESYLATE 0.5 MG PO TABS
0.5000 mg | ORAL_TABLET | Freq: Two times a day (BID) | ORAL | Status: DC
  Administered 2012-12-22 – 2012-12-23 (×2): 0.5 mg via ORAL
  Filled 2012-12-22 (×6): qty 1

## 2012-12-22 MED ORDER — WARFARIN - PHARMACIST DOSING INPATIENT
Freq: Every day | Status: DC
  Administered 2012-12-27: 16:00:00
  Filled 2012-12-22 (×24): qty 1

## 2012-12-22 MED ORDER — POTASSIUM CHLORIDE CRYS ER 20 MEQ PO TBCR
20.0000 meq | EXTENDED_RELEASE_TABLET | Freq: Once | ORAL | Status: AC
  Administered 2012-12-22: 20 meq via ORAL
  Filled 2012-12-22: qty 1

## 2012-12-22 MED ORDER — NAPROXEN 250 MG PO TABS
250.0000 mg | ORAL_TABLET | Freq: Two times a day (BID) | ORAL | Status: DC
  Administered 2012-12-22 – 2013-01-02 (×23): 250 mg via ORAL
  Filled 2012-12-22 (×29): qty 1

## 2012-12-22 NOTE — [facility] Group Notes (Signed)
[facility] LCSW Aftercare Discharge Planning Group Note   12/22/2012 9:47 AM  Participation Quality:  Did not attend.  Was in bed on 1:1.  States she recently fell, and needs to rest.  Told me she could heal me.    [name]

## 2012-12-22 NOTE — [facility] Group Notes (Signed)
[facility] LCSW Group Therapy  12/22/2012 1:15 pm  Type of Therapy: Process Group Therapy  Participation Level:  Did not attend   Summary of Progress/Problems: Today's group addressed the issue of overcoming obstacles.  Patients were asked to identify their biggest obstacle post d/c that stands in the way of their on-going success, and then problem solve as to how to manage this.  Daryel Gerald B 12/22/2012   3:13 PM

## 2012-12-22 NOTE — ED Notes (Signed)
Pt states taht she is healed and that she doesn't hurt anymore

## 2012-12-22 NOTE — ED Provider Notes (Signed)
CSN: [id number]     Arrival date & time 12/22/12  0120 History   None    Chief Complaint  Patient presents with  . Fall   (Consider location/radiation/quality/duration/timing/severity/associated sxs/prior Treatment) HPI Hx per PT  - is on coumadin for h/o PE, also has chronic schizophrenia and is currently inpatient at [facility]. She has been having back spasms, and had one tonight with fall and struck her head L posterior/ parietal region. No LOC.  No neck pain. No weakness or numbness.  No other injury. No back pain at this time. Head pain mild in severity. Sent here for CT scan.    Past Medical History  Diagnosis Date  . Pulmonary emboli   . Schizophrenia   . Cancer   . Uterus cancer   . Anxiety   . Depression    Past Surgical History  Procedure Laterality Date  . No past surgeries    . Uterine fibroid surgery      uterine cancer   No family history on file. History  Substance Use Topics  . Smoking status: Current Every Day Smoker -- 3.00 packs/day for 15 years    Types: Cigarettes  . Smokeless tobacco: Never Used  . Alcohol Use: No   OB History   Grav Para Term Preterm Abortions TAB SAB Ect Mult Living                 Review of Systems  Constitutional: Negative for fever and chills.  HENT: Negative for neck pain and neck stiffness.   Eyes: Negative for pain.  Respiratory: Negative for shortness of breath.   Cardiovascular: Negative for chest pain.  Gastrointestinal: Negative for abdominal pain.  Genitourinary: Negative for dysuria.  Musculoskeletal: Negative for gait problem.  Skin: Negative for rash.  Neurological: Negative for headaches.  All other systems reviewed and are negative.    Allergies  Darvocet  Home Medications   Current Outpatient Rx  Name  Route  Sig  Dispense  Refill  . warfarin (COUMADIN) 2.5 MG tablet   Oral   Take 2.5 mg by mouth daily. Take one tablet by mouth on Monday, Wednesday and Fridays for blood thinner.         . warfarin  (COUMADIN) 5 MG tablet   Oral   Take 5 mg by mouth daily. Take one tablet by mouth on Sunday, Tuesday, Thursday and Saturdays for blood thinner.          LMP 11/16/2012 Physical Exam  Constitutional: She is oriented to person, place, and time. She appears well-developed and well-nourished.  HENT:  Head: Normocephalic.  Mild tenderness L parietal and posterior scalp no laceration, swelling or obvious hematoma.   Eyes: EOM are normal. Pupils are equal, round, and reactive to light.  Neck: Neck supple.  No C spine tenderness or deformity  Cardiovascular: Normal rate, regular rhythm and intact distal pulses.   Pulmonary/Chest: Effort normal and breath sounds normal. No respiratory distress.  Musculoskeletal: Normal range of motion. She exhibits no edema and no tenderness.  Neurological: She is alert and oriented to person, place, and time. No cranial nerve deficit.  Speech clear and cooperative  Skin: Skin is warm and dry.    ED Course  Procedures (including critical care time) Labs Review Results for orders placed during the hospital encounter of 12/22/12  CBC      Result Value Range   WBC 12.9 (*) 4.0 - 10.5 K/uL   RBC 4.62  3.87 - 5.11 MIL/uL  Hemoglobin 14.8  12.0 - 15.0 g/dL   HCT 69.6  29.5 - 28.4 %   MCV 90.9  78.0 - 100.0 fL   MCH 32.0  26.0 - 34.0 pg   MCHC 35.2  30.0 - 36.0 g/dL   RDW 13.2 (*) 44.0 - 10.2 %   Platelets 309  150 - 400 K/uL  PROTIME-INR      Result Value Range   Prothrombin Time 16.3 (*) 11.6 - 15.2 seconds   INR 1.34  0.00 - 1.49  POCT I-STAT, CHEM 8      Result Value Range   Sodium 139  135 - 145 mEq/L   Potassium 3.3 (*) 3.5 - 5.1 mEq/L   Chloride 99  96 - 112 mEq/L   BUN 13  6 - 23 mg/dL   Creatinine, Ser 7.25  0.50 - 1.10 mg/dL   Glucose, Bld 95  70 - 99 mg/dL   Calcium, Ion 3.66  4.40 - 1.23 mmol/L   TCO2 28  0 - 100 mmol/L   Hemoglobin 16.0 (*) 12.0 - 15.0 g/dL   HCT 34.7 (*) 42.5 - 95.6 %   Ct Head Wo Contrast  12/22/2012    CLINICAL DATA:  Recurrent falls. Headache.  EXAM: CT HEAD WITHOUT CONTRAST  TECHNIQUE: Contiguous axial images were obtained from the base of the skull through the vertex without intravenous contrast.  COMPARISON:  Head CT scan 09/14/2011.  FINDINGS: The brain appears normal without infarct, hemorrhage, mass lesion, mass effect, midline shift or abnormal extra-axial fluid collection. No hydrocephalus or pneumocephalus. The calvarium is intact. Imaged paranasal sinuses and mastoid air cells are clear.  IMPRESSION: Negative exam.   Electronically Signed   By: Drusilla Kanner M.D.   On: 12/22/2012 01:57   Prior records reviewed - last MED admit 08/17/11 MD recs likely will need life long anticoagulation. She has h/o noncompliance, currently PSY inpatient getting her regular medications including coumadin, will need to recheck INR in a few days to confirm that she is therapeutic.  PT states understanding this - her PSY condition complicates her medical issues.   MDM  DX: Minor Head injury, subtherapuetic INR  Stable for transfer back to [facility] to continue medications Delayed head bleed precautions provided VS and nurses notes reviewed   Sunnie Nielsen, MD 12/22/12 [phone]

## 2012-12-22 NOTE — Progress Notes (Addendum)
This is patients second fall today.  Prior to the fall I spoke with patient.  She was alert and oriented to person.  Pt was psychotic and therefore orientation to time, situation, and place was not noted.  Patient experienced unwitnessed fall.    Per patient, her leg "seized up" on her.  Pt states that she has experienced this before.  Pt states that she hit her head on the floor.  PA contacted.  Order given to send pt to ED for evaluation.  911 called.  Pt's vitals were BP 120/79, HR 98, and respirations 20.  After fall, pt's orientation remained the same.  Neuro check performed.  Grips in both hands were strong.  Pt was able to follow commands.  When I asked the patient what we could have done to keep her safe she stated, "I need my coumadin.  That would help me."  Pt transported to [facility] ED by EMS.

## 2012-12-22 NOTE — Progress Notes (Signed)
Patient ID: [name], female   DOB: [birth year], 42 y.o.   MRN: [id number] [facility] MD Progress Note  12/22/2012 10:58 AM [name]  MRN:  [id number]  Subjective:  "I have super natural power." Objective: Patient is extremely delusional, paranoid and grandiose. She says she has power to cast out demons from people and speaks to evil spirits all the time. He talks to himself non-stop  as is responding to internal stimulation. Patient has no insight into her problem. Her thought process is bizarre and disorganized. DSM5: Schizophrenia Disorders:   DSM5:  AXIS I: Chronic Paranoid Schizophrenia  AXIS II: Deferred  AXIS III:  Past Medical History   Diagnosis  Date   .  Pulmonary emboli    .  Schizophrenia    .  Cancer    .  Uterus cancer    .  Anxiety    .  Depression     AXIS IV: other psychosocial or environmental problems and problems related to social environment  AXIS V: 31-40 impairment in reality testing  ADL's:  Intact  Sleep: Fair  Appetite:  Fair  Suicidal Ideation:  denies Homicidal Ideation:  denies AEB (as evidenced by):  Psychiatric Specialty Exam: Review of Systems  Unable to perform ROS: acuity of condition    Blood pressure 127/83, pulse 88, temperature 97.5 F (36.4 C), temperature source Oral, resp. rate 20, last menstrual period 11/16/2012, SpO2 98.00%.There is no weight on file to calculate BMI.  General Appearance: Disheveled  Eye Contact::  fair  Speech:  alert  Volume:  Normal  Mood:    euthymic  Affect:    congruent  Thought Process:  disorganized  Orientation:  NA  Thought Content:  She is responding to internal stimulation  Suicidal Thoughts:  denies  Homicidal Thoughts:  denies  Memory:  fair  Judgement: poor   Insight:  lacking  Psychomotor Activity:  normal  Concentration:  poor  Recall:  poor  Akathisia:  none  Handed:    AIMS (if indicated):     Assets:    Sleep:      Current Medications: Current Facility-Administered  Medications  Medication Dose Route Frequency Provider Last Rate Last Dose  . alum & mag hydroxide-simeth (MAALOX/MYLANTA) 200-200-20 MG/5ML suspension 30 mL  30 mL Oral Q4H PRN Fransisca Kaufmann, NP      . LORazepam (ATIVAN) tablet 1 mg  1 mg Oral Q8H PRN Shuvon Rankin, NP   1 mg at 12/20/12 0425  . lurasidone (LATUDA) tablet 40 mg  40 mg Oral Q breakfast Pollie Poma   40 mg at 12/20/12 0842  . magnesium hydroxide (MILK OF MAGNESIA) suspension 30 mL  30 mL Oral Daily PRN Fransisca Kaufmann, NP      . nicotine (NICODERM CQ - dosed in mg/24 hours) patch 21 mg  21 mg Transdermal Daily Shuvon Rankin, NP   21 mg at 12/20/12 0840  . traZODone (DESYREL) tablet 50 mg  50 mg Oral QHS PRN,MR X 1 Fransisca Kaufmann, NP      . warfarin (COUMADIN) tablet 7.5 mg  7.5 mg Oral ONCE-1800 Joas Motton      . Warfarin - Pharmacist Dosing Inpatient   Does not apply q1800 Marcelene Weidemann        Lab Results:  Results for orders placed during the hospital encounter of 12/22/12 (from the past 48 hour(s))  CBC     Status: Abnormal   Collection Time    12/22/12  1:40 AM  Result Value Range   WBC 12.9 (*) 4.0 - 10.5 K/uL   RBC 4.62  3.87 - 5.11 MIL/uL   Hemoglobin 14.8  12.0 - 15.0 g/dL   HCT 09.8  11.9 - 14.7 %   MCV 90.9  78.0 - 100.0 fL   MCH 32.0  26.0 - 34.0 pg   MCHC 35.2  30.0 - 36.0 g/dL   RDW 82.9 (*) 56.2 - 13.0 %   Platelets 309  150 - 400 K/uL  PROTIME-INR     Status: Abnormal   Collection Time    12/22/12  1:40 AM      Result Value Range   Prothrombin Time 16.3 (*) 11.6 - 15.2 seconds   INR 1.34  0.00 - 1.49  POCT I-STAT, CHEM 8     Status: Abnormal   Collection Time    12/22/12  1:57 AM      Result Value Range   Sodium 139  135 - 145 mEq/L   Potassium 3.3 (*) 3.5 - 5.1 mEq/L   Chloride 99  96 - 112 mEq/L   BUN 13  6 - 23 mg/dL   Creatinine, Ser 8.65  0.50 - 1.10 mg/dL   Glucose, Bld 95  70 - 99 mg/dL   Calcium, Ion 7.84  6.96 - 1.23 mmol/L   TCO2 28  0 - 100 mmol/L   Hemoglobin 16.0 (*) 12.0 -  15.0 g/dL   HCT 29.5 (*) 28.4 - 13.2 %    Physical Findings: AIMS: Facial and Oral Movements Muscles of Facial Expression: None, normal Lips and Perioral Area: None, normal Jaw: None, normal Tongue: None, normal, ,  ,  ,    CIWA:    COWS:     Treatment Plan Summary: Daily contact with patient to assess and evaluate symptoms and progress in treatment Medication management  Plan: 1. Continue crisis management and stabilization. 2. Medication management to reduce current symptoms to base line and improve patient's overall level of functioning 3. Treat health problems as indicated. 4. Develop treatment plan to decrease risk of relapse upon discharge and the need for readmission. 5. Psycho-social education regarding relapse prevention and self care. 6. Health care follow up as needed for medical problems. 7. Continue home medications where appropriate. 8.. Prolixin 2.5mg  po BID for psychosis/and delusion 9.  Tripletal 300mg  po BID for mood Medical Decision Making Problem Points:  Established problem, stable/improving (1) Data Points:  Review or order medicine tests (1)  I certify that inpatient services furnished can reasonably be expected to improve the patient's condition.  Thedore Mins, MD 12/22/2012 10:58 AM

## 2012-12-22 NOTE — Progress Notes (Signed)
Recreation Therapy Notes  Date: 09.22.2014 Time: 9:40am Location: [address]  Group Topic: Leisure Education  Goal Area(s) Addresses:  Patient will verbalize activity of interest by end of group session. Patient will verbalize the ability to use positive leisure/recreation as a coping mechanism.  Behavioral Response: Did not attend. Per MHT patient in bed on 1:1  [employer], LRT/CTRS  Jearl Klinefelter 12/22/2012 1:19 PM

## 2012-12-22 NOTE — Progress Notes (Signed)
Spoke with Maryjean Morn, PA concerning pt's orders being restarted.  He advised me to contact pharmacy to have orders placed.  Called [facility] pharmacy.  Spoke with a female pharmacist that advised me that I could put in the orders and that she would instruct me on how to do so.  I then called Leonette Most back to advise him that pharmacy is requesting that we input the orders.  Charles, [place] refused, stating that he does not feel comfortable placing orders for a patient that he has not seen.  Will forward this information to day RN to have morning MD evaluate patient and reinstate pt's old orders.

## 2012-12-22 NOTE — Progress Notes (Signed)
ANTICOAGULATION CONSULT NOTE - Follow Up Consult  Pharmacy Consult for Coumadin Indication:  H/O PE  Allergies  Allergen Reactions  . Darvocet [Propoxyphene-Acetaminophen] Nausea And Vomiting and Other (See Comments)    Shaking      Vital Signs: Temp: 97.5 F (36.4 C) (09/22 0647) Temp src: Oral (09/22 0647) BP: 127/83 mmHg (09/22 0647) Pulse Rate: 88 (09/22 0647)  Labs:  Recent Labs  12/20/12 0700 12/21/12 1925 12/22/12 0140 12/22/12 0157  HGB  --   --  14.8 16.0*  HCT  --   --  42.0 47.0*  PLT  --   --  309  --   LABPROT 14.3 15.3* 16.3*  --   INR 1.13 1.24 1.34  --   CREATININE  --   --   --  1.10    The CrCl is unknown because both a height and weight (above a minimum accepted value) are required for this calculation.   Medications:  Scheduled:    Assessment: Currently INR below goal.  Patient fell last evening.  Was sent to ED and evaluated.  INR slowly increasing although well below goal of INR 2-3   Goal of Therapy:  INR 2-3    Plan:  Coumadin 5 mg PO x1 today at 1800 PT/INR in am  F/U in am   [name] 12/22/2012,7:12 AM

## 2012-12-22 NOTE — Progress Notes (Signed)
Patient ID: [name], female   DOB: [birth year], 42 y.o.   MRN: [id number] Patient has been in lying in bed today.  She is tremulous and unsteady on her feet.  Patient continues to c/o of "seizures in my knee."  She also has "panic attacks" in her knee.  This is the right knee.  Patient is also "giving out energy" and can "heal".  Patient denies any SI/HI, however, she is responding to internal stimuli.  Patient observed talking to herself in her room.  She remains delusional and paranoid.  Patient is to remain on 1:1 for safety.

## 2012-12-22 NOTE — ED Notes (Addendum)
Pt demanding that MD check er warfarin level. Pt also c/o intermitant calf cramping

## 2012-12-22 NOTE — Progress Notes (Signed)
Patient ID: [name], female   DOB: Oct 02, 1970, 42 y.o.   MRN: [id number] D:Patient remains 1:1 due to high fall risk.  Patient lying in bed with sitter present.  When giving her medications, she opened her arms to "give me energy."  When MD came in her room, she pulled up her knee and said "it's seizing on me."  Patient remains delusional and states that she has "healing powers."  She is religiously preoccupied with the "anticrist."  Patient denies any SI/HI/AVH. A:Continue to monitor medication management and MD orders.  Safety checks and 1:1 continued per protocol. R:Monitor patient's behavior and redirect as necessary.

## 2012-12-22 NOTE — Progress Notes (Signed)
Patient affects continues to be bizarre and strange. She is delusional and acting strange. Denied SI/HI; although she talks to herself. She also moves her leg non purposefully and seemed anxious. Ativan given for anxiety. Note written at 2230 in patient's room. Q 15 minute checks continues as ordered to maintain safety.

## 2012-12-22 NOTE — Progress Notes (Signed)
Patient ID: [name], female   DOB: [birth year], 42 y.o.   MRN: [id number] Patient remains on 1:1 for safety.  She has been lying in bed today due to feeling tremulous and unsteady on her feet.  She stays back from the cafeteria and ambulates only by wheelchair.  Patient remains delusional and responding to internal stimuli.

## 2012-12-22 NOTE — ED Notes (Signed)
Pt arrived via EMS fm [facility] after the last of several falls today. Pt c/o posterior head pain where she fell and hit head. Neg visible trauma but EMS reports positive TTP over posterior of head. Pt denied back, and neck pain. EMS vitals 128/88, P-78 sinus, R-16, 100% on RA. Pt intially admitted through [facility] to [facility].

## 2012-12-22 NOTE — Progress Notes (Addendum)
I left a voice message with Windell Hummingbird, pt's emergency contact.

## 2012-12-23 LAB — COMPREHENSIVE METABOLIC PANEL
ALT: 16 U/L (ref 0–35)
AST: 26 U/L (ref 0–37)
Albumin: 3.5 g/dL (ref 3.5–5.2)
Alkaline Phosphatase: 68 U/L (ref 39–117)
CO2: 25 mEq/L (ref 19–32)
Calcium: 8.9 mg/dL (ref 8.4–10.5)
Chloride: 100 mEq/L (ref 96–112)
Creatinine, Ser: 0.96 mg/dL (ref 0.50–1.10)
GFR calc non Af Amer: 71 mL/min — ABNORMAL LOW (ref >90)
Glucose, Bld: 88 mg/dL (ref 70–99)
Potassium: 3.8 mEq/L (ref 3.5–5.1)
Sodium: 136 mEq/L (ref 135–145)
Total Bilirubin: 0.4 mg/dL (ref 0.3–1.2)

## 2012-12-23 LAB — PROTIME-INR
INR: 1.49 (ref 0.00–1.49)
Prothrombin Time: 17.6 seconds — ABNORMAL HIGH (ref 11.6–15.2)

## 2012-12-23 MED ORDER — TRAZODONE HCL 50 MG PO TABS
50.0000 mg | ORAL_TABLET | Freq: Every day | ORAL | Status: DC
  Administered 2012-12-23 (×2): 50 mg via ORAL
  Filled 2012-12-23 (×2): qty 1

## 2012-12-23 MED ORDER — POTASSIUM CHLORIDE CRYS ER 20 MEQ PO TBCR
20.0000 meq | EXTENDED_RELEASE_TABLET | Freq: Once | ORAL | Status: AC
  Administered 2012-12-23: 20 meq via ORAL
  Filled 2012-12-23: qty 1

## 2012-12-23 MED ORDER — WARFARIN SODIUM 7.5 MG PO TABS
7.5000 mg | ORAL_TABLET | Freq: Once | ORAL | Status: AC
  Administered 2012-12-23: 7.5 mg via ORAL
  Filled 2012-12-23 (×2): qty 1

## 2012-12-23 MED ORDER — BENZTROPINE MESYLATE 1 MG PO TABS
1.0000 mg | ORAL_TABLET | Freq: Two times a day (BID) | ORAL | Status: DC
  Administered 2012-12-23 – 2012-12-29 (×12): 1 mg via ORAL
  Filled 2012-12-23 (×14): qty 1

## 2012-12-23 MED ORDER — POTASSIUM CHLORIDE CRYS ER 20 MEQ PO TBCR
20.0000 meq | EXTENDED_RELEASE_TABLET | Freq: Once | ORAL | Status: AC
  Administered 2012-12-23: 20 meq via ORAL
  Filled 2012-12-23 (×3): qty 1

## 2012-12-23 MED ORDER — LORAZEPAM 0.5 MG PO TABS
0.5000 mg | ORAL_TABLET | Freq: Three times a day (TID) | ORAL | Status: DC | PRN
  Administered 2012-12-23: 0.5 mg via ORAL
  Filled 2012-12-23 (×2): qty 1

## 2012-12-23 MED ORDER — FLUPHENAZINE HCL 5 MG PO TABS
5.0000 mg | ORAL_TABLET | Freq: Two times a day (BID) | ORAL | Status: DC
  Administered 2012-12-23 – 2013-01-02 (×20): 5 mg via ORAL
  Filled 2012-12-23 (×22): qty 1

## 2012-12-23 MED ORDER — LORAZEPAM 1 MG PO TABS
1.0000 mg | ORAL_TABLET | Freq: Once | ORAL | Status: AC
  Administered 2012-12-23: 1 mg via ORAL
  Filled 2012-12-23 (×2): qty 1

## 2012-12-23 NOTE — Progress Notes (Signed)
D: Patient in bed asleep at the beginning of this shift. She woke up and received her HS medications at about 2050. She seemed better than she was last night and took all medications including prn Trazodone without complaint. Although she remains psychotic and continues to make gestures with her hands. She denied SI/HI. A: Writer offered all medications that were due. Encouraged and supported patient. Assisted patient on her wheel chair to be scanned at the medication window; since rover not working. R: 1:1 observations continues as ordered to maintain safety.

## 2012-12-23 NOTE — Progress Notes (Signed)
Patient ID: [name], female   DOB: 1970-06-20, 42 y.o.   MRN: [id number] D:Patient remains 1:1 for safety.  Patient continues to have decreased appetite.  She is only eating approximately 10% of her meals.  Patient's last fall was early this morning.  Patient's has been complaining of brusing on her arm.  She does have some bruising, however, it is hard to tell if they were a result of the fall as they appear to be in the healing process.  Patient stated that she needed to be transferred to have any xray.  She stated that she has a concussion and the doctor told her she had brain fluid leaking out.  Patient was informed that since she did not hit her head, an xray would not be needed. She also stated she thought her hip was fractured.  Informed patient that I would relay the information to the nurse practitioner.  A:Continue to monitor vitals post fall. Monitor medication management and Md orders.  1:1 continued due high fall risk. R:Encourage and support patient.
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
## 2012-12-23 NOTE — Progress Notes (Signed)
D: Writer reassessed patient couple of time after given the medication ordered at [phone] and also at 0620 am. Patient reported that  feeling better and had less movement. At [phone] MHT on the hallway reported that she went to get patient for her lab and patient  jump off the bed and before she could assist her she was on the floor. A:  Assessed patient, she denied hitting her head or any injury, denied pain and no sign of distress noted. Dora Sims PA notified. Writer asked patient if to call her family members. Vital signs done; Post fall assessment completed. Writer encouraged sitter to move closer to patient.  R: Patient denied headache, pain or any negative symptoms. She declined calling any family member. 1:1 observation continues to maintain safety.

## 2012-12-23 NOTE — Progress Notes (Signed)
Patient ID: [name], female   DOB: [birth year], 42 y.o.   MRN: [id number] Patient remains 1:1 due to high fall risk.  Patient needed 2 assist to the toilet.  When she was transferred from the wheelchair to the toilet, patient began twitching and jerking her limbs.  Patient was attempted to fall off the toilet to the floor.  Patient was transferred back to bed and a depends and pads were placed on her bed.  Patient was advised to drink fluids as she is not eating or drinking.  Sitter advised that she has not observed patient urinating in the past 2 days.  Patient did have urine on her gown which was washing.  Patient is focused on her right leg twitching. Nurse practitioner and [facility] was notified of patient's declining condition.  Patient will be reassessed in the morning.   Continue to observe patient's behavior and redirect as necessary.  Patient has to be constantly watched and within arm's length as patient will throw herself on the floor.

## 2012-12-23 NOTE — Progress Notes (Signed)
ANTICOAGULATION CONSULT NOTE - Follow Up Consult  Pharmacy Consult for Coumadin Indication: H/O PE  Allergies  Allergen Reactions  . Darvocet [Propoxyphene-Acetaminophen] Nausea And Vomiting and Other (See Comments)    Shaking     Patient Measurements: Weight= 71.7kg  Vital Signs:   Temp: 98.1 F (36.7 C) (09/23 0747) Temp src: Oral (09/23 0747) BP: 143/83 mmHg (09/23 0747) Pulse Rate: 103 (09/23 0747)  Labs:  Recent Labs  12/22/12 0140 12/22/12 0157 12/22/12 0615 12/23/12 0625  HGB 14.8 16.0*  --   --   HCT 42.0 47.0*  --   --   PLT 309  --   --   --   LABPROT 16.3*  --  16.1* 17.6*  INR 1.34  --  1.32 1.49  CREATININE  --  1.10 0.89  --     The CrCl is unknown because both a height and weight (above a minimum accepted value) are required for this calculation.   Medications:  Scheduled:  . benztropine  0.5 mg Oral BID  . feeding supplement  237 mL Oral BID BM  . fluPHENAZine  2.5 mg Oral BID  . naproxen  250 mg Oral BID WC  . nicotine  21 mg Transdermal Daily  . OXcarbazepine  300 mg Oral BID  . Warfarin - Pharmacist Dosing Inpatient   Does not apply q1800   Infusions:   PRN: acetaminophen, alum & mag hydroxide-simeth, LORazepam, magnesium hydroxide, traZODone  Assessment: 42 yo F with hx of PE.  INR remains below goal of 2-3. Patient evaluated after fall; CT revealed no problems. INR slowly increasing but still below goal of 2-3.   Goal of Therapy:  INR 2-3 Monitor platelets by anticoagulation protocol: Yes   Plan:  Coumadin 7.5mg  today x1 at 1800. PT/INR in am. F/U labs in AM  Loletta Specter 12/23/2012,8:29 AM
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
## 2012-12-23 NOTE — [facility] Group Notes (Addendum)
[facility] LCSW Group Therapy  12/23/2012 , 12:23 PM   Type of Therapy:  Group Therapy  Participation Level:  Did not attend.  Found her in bed prior tp group.  Stated she is fine and ready to go home, but unable to identify where she is going.  Told me the anti-Christ is next door to her.    Summary of Progress/Problems: Today's group focused on the term Diagnosis.  Participants were asked to define the term, and then pronounce whether it is a negative, positive or neutral term.  Daryel Gerald B 12/23/2012 , 12:23 PM

## 2012-12-24 DIAGNOSIS — R300 Dysuria: Secondary | ICD-10-CM

## 2012-12-24 DIAGNOSIS — R630 Anorexia: Secondary | ICD-10-CM

## 2012-12-24 LAB — URINALYSIS, ROUTINE W REFLEX MICROSCOPIC
Bilirubin Urine: NEGATIVE
Glucose, UA: NEGATIVE mg/dL
Hgb urine dipstick: NEGATIVE
Protein, ur: NEGATIVE mg/dL
Specific Gravity, Urine: 1.013 (ref 1.005–1.030)
Urobilinogen, UA: 0.2 mg/dL (ref 0.0–1.0)

## 2012-12-24 MED ORDER — BOOST PLUS PO LIQD
237.0000 mL | Freq: Three times a day (TID) | ORAL | Status: DC
  Administered 2012-12-24 – 2012-12-29 (×14): 237 mL via ORAL
  Filled 2012-12-24 (×21): qty 237

## 2012-12-24 MED ORDER — WARFARIN SODIUM 5 MG PO TABS
5.0000 mg | ORAL_TABLET | Freq: Once | ORAL | Status: AC
  Administered 2012-12-24: 5 mg via ORAL
  Filled 2012-12-24: qty 1

## 2012-12-24 MED ORDER — TRAZODONE HCL 50 MG PO TABS
75.0000 mg | ORAL_TABLET | Freq: Every day | ORAL | Status: DC
  Filled 2012-12-24 (×2): qty 1

## 2012-12-24 MED ORDER — WARFARIN SODIUM 5 MG PO TABS
5.0000 mg | ORAL_TABLET | Freq: Once | ORAL | Status: DC
  Filled 2012-12-24: qty 1

## 2012-12-24 NOTE — Progress Notes (Signed)
Patient resting quietly with eyes closed. Respirations even and unlabored. No distress noted. Unable to check patient's v/S at this time because patient comfortably resting.1:1 observations continues as ordered to maintain safety.

## 2012-12-24 NOTE — Progress Notes (Signed)
Patient ID: [name], female   DOB: [birth year], 42 y.o.   MRN: [id number] D:Patient has improved since yesterday.  She remains delusional, however, she is more coherent and organized.  Patient is still not eating and drinking enough fluids.  This morning, her urine was very concentrated and orange.  Due to patient's dehydration and appetite, a medical consult was ordered.  Patient is now on strict I&O's.  Patient did eat about 10% of her breakfast and is drinking some gaterade and water.  Patient denies any SI/HI/AVH.   A:Continue to monitor intake and output.  Nutritional consult ordered.  Boost was ordered for fluid intake and nutrition.  Continue 1:1 due extreme high fall risk. R:Patient's behavior has improved.

## 2012-12-24 NOTE — Consult Note (Signed)
[facility] Consult Note History and Physical  [name] ZOX:[id number] DOB: February 07, 1971 DOA: 12/22/2012  Referring physician: Verne Spurr, PA. Reason for consultation: Patient reportedly not eating or drinking, concerns for dehydration. PCP: Provider Not In System   Chief Complaint: Not eating/drinking   History of Present Illness: [name] is an 42 y.o. female with PMH of chronic paranoid schizophrenia and posttraumatic stress disorder was admitted to [facility] on 12/24/2012 secondary to not taking her medications and mental status changes. Her urine drug screen was positive for THC and benzodiazepines. She was noted to be psychotic and delusional on admission. Her thoughts were disorganized. She has been on 1-1 observation with the staff, and staff have expressed concerns that she has had decreased by mouth intake and has not been using the bathroom and therefore there are some concerns that she is becoming dehydrated. A review of her labs reveals a normal BUN-creatinine ratio on 12/23/2012. I have asked the staff to monitor her intake and output strictly and subsequent notes indicate that she ate 50% of her lunch and was noted to be drinking fluids. She is wearing a diaper secondary to urinary incontinence and was noted to have a soiled diaper this morning with concentrated looking urine. She tells me she is now able to use the bathroom independently. She tells me she has been steadily gaining weight over time and that she has been eating and drinking today.  Review of Systems: Constitutional: No fever, no chills;  Appetite normal; No weight loss, + weight gain.  HEENT: No blurry vision, no diplopia, no pharyngitis, no dysphagia CV: No chest pain, no palpitations.  Resp: No SOB, no cough. GI: No nausea, no vomiting, + recent diarrhea, no melena, no hematochezia.  GU: + dysuria, no hematuria.  MSK: no myalgias, + right elbow and right hip arthralgias.  Neuro:  No headache, no focal  neurological deficits, no history of seizures.  Psych: No depression, no anxiety.  Endo: No thyroid disease, no DM, no heat intolerance, no cold intolerance, no polyuria, no polydipsia  Skin: No rashes, + ecchymosis from recent fall, right elbow.  Heme: No easy bruising, no history of blood diseases.  Past Medical History Past Medical History  Diagnosis Date  . Pulmonary emboli   . Schizophrenia   . Cancer   . Uterus cancer   . Anxiety   . Depression      Past Surgical History Past Surgical History  Procedure Laterality Date  . No past surgeries    . Uterine fibroid surgery      uterine cancer     Social History: History   Social History  . Marital Status: Legally Separated    Spouse Name: N/A    Number of Children: N/A  . Years of Education: N/A   Occupational History  . Not on file.   Social History Main Topics  . Smoking status: Current Every Day Smoker -- 3.00 packs/day for 15 years    Types: Cigarettes  . Smokeless tobacco: Never Used  . Alcohol Use: No  . Drug Use: Yes    Special: Marijuana  . Sexual Activity: No   Other Topics Concern  . Not on file   Social History Narrative  . No narrative on file    Family History:  No family history on file.  Allergies: Darvocet  Meds: Prior to Admission medications   Medication Sig Start Date End Date Taking? Authorizing Provider  warfarin (COUMADIN) 2.5 MG tablet Take 2.5 mg  by mouth once a week. Only on sunday 10/18/11   Curlene Labrum Readling, MD  warfarin (COUMADIN) 5 MG tablet Take 5 mg by mouth daily. Take one tablet by mouth everyday except sundays for blood thinner. 10/18/11   Ronny Bacon, MD    Physical Exam: Filed Vitals:   12/23/12 2037 12/23/12 2356 12/24/12 0026 12/24/12 0620  BP: 149/93 124/81 148/73 127/87  Pulse: 114 89 82 99  Temp: 98 F (36.7 C) 97.8 F (36.6 C) 98.1 F (36.7 C) 97.9 F (36.6 C)  TempSrc: Oral Oral Oral Oral  Resp: 20 16 20 18   SpO2: 95% 94% 95%      Physical  Exam: Blood pressure 127/87, pulse 99, temperature 97.9 F (36.6 C), temperature source Oral, resp. rate 18, last menstrual period 11/16/2012, SpO2 95.00%. Gen: No acute distress. Head: Normocephalic, atraumatic. Eyes: PERRL, EOMI, sclerae nonicteric. Mouth: Oropharynx clear with moist mucous membranes. Neck: Supple, no thyromegaly, no lymphadenopathy, no jugular venous distention. Chest: Lungs clear to auscultation bilaterally. CV: Heart sounds are regular. No murmurs, rubs, or gallops. Abdomen: Soft, nontender, nondistended with normal active bowel sounds. Extremities: Extremities are without clubbing, edema, or cyanosis. Skin: Warm and dry. Bruising about the right elbow from recent fall. Neuro: Alert and oriented times 3; cranial nerves II through XII grossly intact. Psych: Mood and affect flat/depressed.  Labs on Admission:  Basic Metabolic Panel:  Recent Labs Lab 12/17/12 2250 12/22/12 0157 12/22/12 0615 12/23/12 1945  NA 134* 139 137 136  K 3.4* 3.3* 3.1* 3.8  CL 94* 99 98 100  CO2 25  --  26 25  GLUCOSE 100* 95 93 88  BUN 15 13 13 10   CREATININE 1.17* 1.10 0.89 0.96  CALCIUM 9.5  --  9.7 8.9   Liver Function Tests:  Recent Labs Lab 12/17/12 2250 12/22/12 0615 12/23/12 1945  AST 81* 29 26  ALT 29 21 16   ALKPHOS 79 73 68  BILITOT 0.8 0.7 0.4  PROT 7.5 7.1 6.8  ALBUMIN 3.7 4.0 3.5   CBC:  Recent Labs Lab 12/17/12 2250 12/22/12 0140 12/22/12 0157  WBC 19.1* 12.9*  --   HGB 15.8* 14.8 16.0*  HCT 45.3 42.0 47.0*  MCV 90.6 90.9  --   PLT 315 309  --     Radiological Exams on Admission: No results found.   Assessment/Plan 1. Anorexia/decreased by mouth intake with concerns for dehydration -The patient appears to be well-hydrated clinically with moist mucous membranes. There is no tenting to her skin. There are multiple drink containers on her bedside table that her half full. -Recommend strict intake and output records. -Recent BUN/creatinine  completely normal and not indicative of dehydration. Recheck in the morning. -For completeness, we'll check a TSH. 2.  Dysuria -Send urinalysis. If indicative of infection, treat accordingly based on sensitivity data. Can start empiric Cipro 250 mg twice a day x3 days if significant pyuria or bactiuria.  Thank you for this consultation.  We will not follow the patient further unless her laboratories reveal significant dehydration or an abnormality of thyroid function.  Time spent: 1 hour.  RAMA,CHRISTINA [facility] Pager [phone]  If 7PM-7AM, please contact night-coverage [email or web address] Password [email or web address]

## 2012-12-24 NOTE — Progress Notes (Signed)
Rn 1:1 note 2130 12/24/12  D: Pt in bed resting with eyes open. Pt continues to deny any concerns she wishes for this writer to address at this time. Pt appears to be in no signs of distress at this time. At this time this pt is refusing her trazodone dose. She believes she can go to sleep without it.  A: Trazodone placed on hold. Sitter at bedside. 1:1 continuous observation remains in effect for this pt.  R: Pt's safety remains in effect at this time.

## 2012-12-24 NOTE — Progress Notes (Signed)
Patient ID: [name], female   DOB: April 24, 1970, 42 y.o.   MRN: [id number]  [facility] MD Progress Note  12/24/2012 11:41 AM ORRIE LASCANO  MRN:  [id number]  Subjective:  "I do not have much appetite and I don't like the Ensure." Objective: Patient remains delusional,  psychotic, delusional, and grandiose. She continues to endorse having a special  power to cast out demons from people and communicates with evil spirits and God. Still has no insight into her problem. Her thought process remains bizarre and disorganized. However, she  reports that she slept better last night. DSM5: Schizophrenia Disorders:   DSM5:  AXIS I: Chronic Paranoid Schizophrenia               Post traumatic stress disorder AXIS II: Deferred  AXIS III:  Past Medical History   Diagnosis  Date   .  Pulmonary emboli    .  Cancer    .  Uterus cancer     AXIS IV: other psychosocial or environmental problems and problems related to social environment  AXIS V: 31-40 impairment in reality testing  ADL's:  Intact  Sleep: Fair  Appetite:  Fair  Suicidal Ideation:  denies Homicidal Ideation:  denies AEB (as evidenced by):  Psychiatric Specialty Exam: Review of Systems  Constitutional: Positive for malaise/fatigue.  HENT: Negative.   Eyes: Negative.   Respiratory: Negative.   Cardiovascular: Negative.   Gastrointestinal: Negative.   Genitourinary: Negative.   Musculoskeletal: Positive for myalgias and joint pain.  Skin: Negative.   Neurological: Negative.   Endo/Heme/Allergies: Negative.   Psychiatric/Behavioral: Positive for hallucinations. The patient is nervous/anxious and has insomnia.     Blood pressure 127/87, pulse 99, temperature 97.9 F (36.6 C), temperature source Oral, resp. rate 18, last menstrual period 11/16/2012, SpO2 95.00%.There is no weight on file to calculate BMI.  General Appearance: Disheveled  Eye Contact::  fair  Speech:  alert  Volume:  Normal  Mood:    dysphoric  Affect:     constricted  Thought Process:  disorganized  Orientation:  Only to person  Thought Content:  delusional  Suicidal Thoughts:  denies  Homicidal Thoughts:  denies  Memory:  fair  Judgement: poor   Insight:  lacking  Psychomotor Activity:  normal  Concentration:  poor  Recall:  poor  Akathisia:  none  Handed:  right  AIMS (if indicated):     Assets:    Sleep:  Number of Hours: 4.75   Current Medications: Current Facility-Administered Medications  Medication Dose Route Frequency Provider Last Rate Last Dose  . alum & mag hydroxide-simeth (MAALOX/MYLANTA) 200-200-20 MG/5ML suspension 30 mL  30 mL Oral Q4H PRN Fransisca Kaufmann, NP      . LORazepam (ATIVAN) tablet 1 mg  1 mg Oral Q8H PRN Shuvon Rankin, NP   1 mg at 12/20/12 0425  . lurasidone (LATUDA) tablet 40 mg  40 mg Oral Q breakfast Kelsie Zaborowski   40 mg at 12/20/12 0842  . magnesium hydroxide (MILK OF MAGNESIA) suspension 30 mL  30 mL Oral Daily PRN Fransisca Kaufmann, NP      . nicotine (NICODERM CQ - dosed in mg/24 hours) patch 21 mg  21 mg Transdermal Daily Shuvon Rankin, NP   21 mg at 12/20/12 0840  . traZODone (DESYREL) tablet 50 mg  50 mg Oral QHS PRN,MR X 1 Fransisca Kaufmann, NP      . warfarin (COUMADIN) tablet 7.5 mg  7.5 mg Oral ONCE-1800 Horace Wishon      .  Warfarin - Pharmacist Dosing Inpatient   Does not apply q1800 Avian Konigsberg        Lab Results:  Results for orders placed during the hospital encounter of 12/22/12 (from the past 48 hour(s))  PROTIME-INR     Status: Abnormal   Collection Time    12/23/12  6:25 AM      Result Value Range   Prothrombin Time 17.6 (*) 11.6 - 15.2 seconds   INR 1.49  0.00 - 1.49   Comment: Performed at [facility]  COMPREHENSIVE METABOLIC PANEL     Status: Abnormal   Collection Time    12/23/12  7:45 PM      Result Value Range   Sodium 136  135 - 145 mEq/L   Potassium 3.8  3.5 - 5.1 mEq/L   Comment: DELTA CHECK NOTED     REPEATED TO VERIFY   Chloride 100  96 - 112  mEq/L   CO2 25  19 - 32 mEq/L   Glucose, Bld 88  70 - 99 mg/dL   BUN 10  6 - 23 mg/dL   Creatinine, Ser 1.61  0.50 - 1.10 mg/dL   Calcium 8.9  8.4 - 09.6 mg/dL   Total Protein 6.8  6.0 - 8.3 g/dL   Albumin 3.5  3.5 - 5.2 g/dL   AST 26  0 - 37 U/L   ALT 16  0 - 35 U/L   Alkaline Phosphatase 68  39 - 117 U/L   Total Bilirubin 0.4  0.3 - 1.2 mg/dL   GFR calc non Af Amer 71 (*) >90 mL/min   GFR calc Af Amer 83 (*) >90 mL/min   Comment: (NOTE)     The eGFR has been calculated using the CKD EPI equation.     This calculation has not been validated in all clinical situations.     eGFR's persistently <90 mL/min signify possible Chronic Kidney     Disease.     Performed at [facility]    Physical Findings: AIMS: Facial and Oral Movements Muscles of Facial Expression: None, normal Lips and Perioral Area: None, normal Jaw: None, normal Tongue: None, normal, ,  ,  ,    CIWA:    COWS:     Treatment Plan Summary: Daily contact with patient to assess and evaluate symptoms and progress in treatment Medication management  Plan: 1. Continue crisis management and stabilization. 2. Medication management to reduce current symptoms to base line and improve patient's overall level of functioning 3. Treat health problems as indicated. 4. Develop treatment plan to decrease risk of relapse upon discharge and the need for readmission. 5. Psycho-social education regarding relapse prevention and self care. 6. Health care follow up as needed for medical problems. 7. Continue home medications where appropriate. 8. Continue Prolixin  To 5mg  po BID for psychosis/and delusion 9. Continue Tripletal 300mg  po BID for mood 10. Increase Trazodone to 75mg  po Qhs at bedtime. Medical Decision Making Problem Points:  Established problem, worsening (2) Data Points:  Review or order medicine tests (1)  I certify that inpatient services furnished can reasonably be expected to improve the  patient's condition.  Thedore Mins, MD 12/24/2012 11:41 AM

## 2012-12-24 NOTE — Progress Notes (Signed)
D: Pt in bed during this interaction. Sitter at the bedside. Pt presented with a bright affect. Pt recognizes this nurse from the previous week. She is currently denying any physical pain. Pt has been encouraged to continue food and fluid consumption. She is currently denying any concerns that she wishes for this writer to address at this time. She has been instructed to use staff assistance when needing to ambulate. Sitter remains at bedside. Pt receptive to this writer's request. Pt is currently denying any SI/HI/AVH. Pt did not attend group this evening. She reports the plan to gain more rest. She is denying any physical pain.  A: Writer administered scheduled and prn medications to pt. Continued support and availability as needed was extended to this pt. Staff continue to monitor pt with q4min checks.  R: No adverse drug reactions noted. Pt receptive to treatment. Pt remains safe at this time.

## 2012-12-24 NOTE — Tx Team (Addendum)
  Interdisciplinary Treatment Plan Update   Date Reviewed:  12/24/2012  Time Reviewed:  11:02 AM  Progress in Treatment:   Attending groups: No Participating in groups: No Taking medication as prescribed: Yes  Tolerating medication: Yes Family/Significant other contact made: No  Patient understands diagnosis: Yes  Discussing patient identified problems/goals with staff: Yes Medical problems stabilized or resolved: Yes Denies suicidal/homicidal ideation: Yes Patient has not harmed self or others: Yes  For review of initial/current patient goals, please see plan of care.  Estimated Length of Stay:  4-5 days  Reason for Continuation of Hospitalization: Delusions  Hallucinations Medication stabilization  New Problems/Goals identified:  Has had multiple falls since here.  On 1:1 as a result.  Was sent over to ED earlier this week.  All notes have disappeared from 9/19-9/23, including initial tx plan.    Discharge Plan or Barriers:   unknown  Additional Comments:  Pt remains symptomatic.  Staying in bed.  Not leaving room.  Not eating.  Paranoid.  Believes the anti-Christ is other patients that are after her.  Will send to ED again today for medical stabilization.  Attendees:  Signature: Thedore Mins, MD 12/24/2012 11:02 AM   Signature: Richelle Ito, LCSW 12/24/2012 11:02 AM  Signature: Fransisca Kaufmann, NP 12/24/2012 11:02 AM  Signature: Joslyn Devon, RN 12/24/2012 11:02 AM  Signature: Liborio Nixon, RN 12/24/2012 11:02 AM  Signature:  12/24/2012 11:02 AM  Signature:   12/24/2012 11:02 AM  Signature:    Signature:    Signature:    Signature:    Signature:    Signature:      Scribe for Treatment Team:   Richelle Ito, LCSW  12/24/2012 11:02 AM

## 2012-12-24 NOTE — Progress Notes (Signed)
Patient ID: [name], female   DOB: 01-10-1971, 42 y.o.   MRN: [id number] Patient continues to improve.  She is less delusional.  She is eating and drinking.  She voided 800 cc today.  Her urine is light yellow.  She has been taking small naps during the day.  She got up to the bathroom with minimal assistance.  Her behavior has been appropriate.  Continue 1:1 due high fall risk.

## 2012-12-24 NOTE — Progress Notes (Signed)
Recreation Therapy Notes  Date: 09.24.2014 Time: 9:30am Location: [address]  Group Topic: Coping Skills  Goal Area(s) Addresses:  Patient will be able to identify coping skills. Patient will work effectively with group members.   Behavioral Response: Did not attend  Jearl Klinefelter, LRT/CTRS  Jearl Klinefelter 12/24/2012 4:26 PM

## 2012-12-24 NOTE — [facility] Group Notes (Signed)
[facility] [employer] Group Therapy  12/24/2012 , 2:54 PM    Type of Therapy:  [employer] Presentation  Participation Level:  Did not attend    Summary of Progress/Problems:  [name] from [employer] came to present his recovery story and play the guitar.    [name] 12/24/2012 , 2:54 PM
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
## 2012-12-24 NOTE — Progress Notes (Signed)
ANTICOAGULATION CONSULT NOTE - Follow Up Consult  Pharmacy Consult for Coumadin Indication:h/o PE  Allergies  Allergen Reactions  . Darvocet [Propoxyphene-Acetaminophen] Nausea And Vomiting and Other (See Comments)    Shaking     Patient Measurements:     Vital Signs: Temp: 97.9 F (36.6 C) (09/24 0620) Temp src: Oral (09/24 0620) BP: 127/87 mmHg (09/24 0620) Pulse Rate: 99 (09/24 0620)  Labs:  Recent Labs  12/22/12 0140 12/22/12 0157 12/22/12 0615 12/23/12 0625 12/23/12 1945  HGB 14.8 16.0*  --   --   --   HCT 42.0 47.0*  --   --   --   PLT 309  --   --   --   --   LABPROT 16.3*  --  16.1* 17.6*  --   INR 1.34  --  1.32 1.49  --   CREATININE  --  1.10 0.89  --  0.96    The CrCl is unknown because both a height and weight (above a minimum accepted value) are required for this calculation.   Medications:  Scheduled:  . benztropine  1 mg Oral BID  . fluPHENAZine  5 mg Oral BID  . lactose free nutrition  237 mL Oral TID WC  . naproxen  250 mg Oral BID WC  . nicotine  21 mg Transdermal Daily  . OXcarbazepine  300 mg Oral BID  . traZODone  75 mg Oral QHS  . warfarin  5 mg Oral ONCE-1800  . Warfarin - Pharmacist Dosing Inpatient   Does not apply q1800    Assessment: INR not drawn today.  No problems noted.    Goal of Therapy:  INR 2-3    Plan:  Coumadin 5mg   x 1 today.   PT/INR in AM  [name] 12/24/2012,3:12 PM

## 2012-12-24 NOTE — Progress Notes (Signed)
NUTRITION ASSESSMENT  Consult secondary to poor po intake.  INTERVENTION: 1. Educated patient on the importance of nutrition and encouraged intake of food and beverages. 2. Discussed weight goals. 3. Supplements: Boost tid  NUTRITION DIAGNOSIS: Unintentional weight loss related to sub-optimal intake as evidenced by pt report.   Goal: Pt to meet >/= 90% of their estimated nutrition needs.  Monitor:  PO intake  Assessment:  Patient admitted with chronic paranoid schizophrenia.  Had not been eating and dislikes Ensure.  Spoke with patient and sitter.  Patient is starting to eat small amounts and has been drinking Boost and Gatorade which.  Patient likes Boost.  Patient complains of bloating with eating and states a plan of increasing intake at each meals.    42 y.o. female  Height: Ht Readings from Last 1 Encounters:  12/18/12 5' 5.35" (1.66 m)    Weight: Wt Readings from Last 1 Encounters:  12/18/12 158 lb (71.668 kg)    Weight Hx: Wt Readings from Last 10 Encounters:  12/18/12 158 lb (71.668 kg)  10/10/11 173 lb (78.472 kg)  09/19/11 222 lb (100.699 kg)  09/14/11 189 lb (85.73 kg)  08/24/11 188 lb 14.4 oz (85.684 kg)  12/25/10 180 lb (81.647 kg)  11/22/10 182 lb 12.8 oz (82.918 kg)    BMI:  26 Pt meets criteria for overweight based on current BMI.  Estimated Nutritional Needs: Kcal: 25-30 kcal/kg Protein: > 1 gram protein/kg Fluid: 1 ml/kcal  Diet Order: General Pt is also offered choice of unit snacks mid-morning and mid-afternoon.  Pt is eating as desired.   Lab results and medications reviewed.   Oran Rein, RD, LDN Clinical Inpatient Dietitian Pager:  [phone] Weekend and after hours pager:  [phone]

## 2012-12-24 NOTE — [facility] Group Notes (Signed)
[facility] LCSW Aftercare Discharge Planning Group Note   12/24/2012 9:31 AM  Participation Quality:  Did not attend    [place]

## 2012-12-24 NOTE — Progress Notes (Signed)
Patient ID: [name], female   DOB: 10-Dec-1970, 42 y.o.   MRN: [id number] D:Patient observed in room asleep.  She did eat part of her lunch, approx. 50% and drank fluids.  Her I&O's are closely monitored.  Patient remains 1:1 due high fall risk.  Patient presents with brighter affect; she looks rested and less delusional.  Patient has not tried to fall on the floor today.  She is wearing a depends and has pads on her bed for incontinence.  Patient voided this morning and urine was very concentrated and orange.  A:Continue to push fluids and nutrition.  Remains 1:1 for safety.  R:Patient's behavior has improved.

## 2012-12-25 LAB — BASIC METABOLIC PANEL
BUN: 14 mg/dL (ref 6–23)
CO2: 24 mEq/L (ref 19–32)
Chloride: 100 mEq/L (ref 96–112)
Glucose, Bld: 99 mg/dL (ref 70–99)
Potassium: 3.7 mEq/L (ref 3.5–5.1)
Sodium: 137 mEq/L (ref 135–145)

## 2012-12-25 LAB — PROTIME-INR
INR: 1.57 — ABNORMAL HIGH (ref 0.00–1.49)
Prothrombin Time: 18.3 seconds — ABNORMAL HIGH (ref 11.6–15.2)

## 2012-12-25 MED ORDER — TRAZODONE HCL 100 MG PO TABS
100.0000 mg | ORAL_TABLET | Freq: Every day | ORAL | Status: DC
  Administered 2012-12-25 – 2013-01-01 (×6): 100 mg via ORAL
  Filled 2012-12-25 (×9): qty 1

## 2012-12-25 MED ORDER — WARFARIN SODIUM 7.5 MG PO TABS
7.5000 mg | ORAL_TABLET | Freq: Once | ORAL | Status: AC
  Administered 2012-12-25: 7.5 mg via ORAL
  Filled 2012-12-25: qty 1

## 2012-12-25 NOTE — [facility] Suicide Risk Assessment (Signed)
[facility] INPATIENT:  Family/Significant Other Suicide Prevention Education  Suicide Prevention Education:  Education Completed; No one has been identified by the patient as the family member/significant other with whom the patient will be residing, and identified as the person(s) who will aid the patient in the event of a mental health crisis (suicidal ideations/suicide attempt).  With written consent from the patient, the family member/significant other has been provided the following suicide prevention education, prior to the and/or following the discharge of the patient.  The suicide prevention education provided includes the following:  Suicide risk factors  Suicide prevention and interventions  National Suicide Hotline telephone number  [facility] assessment telephone number  [facility] Emergency Assistance 911  [facility] and/or Residential Mobile Crisis Unit telephone number  Request made of family/significant other to:  Remove weapons (e.g., guns, rifles, knives), all items previously/currently identified as safety concern.    Remove drugs/medications (over-the-counter, prescriptions, illicit drugs), all items previously/currently identified as a safety concern.  The family member/significant other verbalizes understanding of the suicide prevention education information provided.  The family member/significant other agrees to remove the items of safety concern listed above.  The patient did not endorse SI at the time of admission, nor did the patient c/o SI during the stay here.  SPE not required.   Daryel Gerald B 12/25/2012, 5:11 PM
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
## 2012-12-25 NOTE — Progress Notes (Signed)
ANTICOAGULATION CONSULT NOTE - Follow Up Consult  Pharmacy Consult for Coumadin   Indication: Hiistory of PE  Allergies  Allergen Reactions  . Darvocet [Propoxyphene-Acetaminophen] Nausea And Vomiting and Other (See Comments)    Shaking     Patient Measurements:   Heparin Dosing Weight:   Vital Signs: Temp: 98 F (36.7 C) (09/25 0653) Temp src: Oral (09/25 0653) BP: 112/78 mmHg (09/25 0654) Pulse Rate: 108 (09/25 0654)  Labs:  Recent Labs  12/23/12 0625 12/23/12 1945 12/25/12 0630  LABPROT 17.6*  --  18.3*  INR 1.49  --  1.57*  CREATININE  --  0.96 0.86    The CrCl is unknown because both a height and weight (above a minimum accepted value) are required for this calculation.   Medications:  Scheduled:  . benztropine  1 mg Oral BID  . fluPHENAZine  5 mg Oral BID  . lactose free nutrition  237 mL Oral TID WC  . naproxen  250 mg Oral BID WC  . nicotine  21 mg Transdermal Daily  . OXcarbazepine  300 mg Oral BID  . traZODone  75 mg Oral QHS  . warfarin  7.5 mg Oral ONCE-1800  . Warfarin - Pharmacist Dosing Inpatient   Does not apply q1800    Assessment: 42 yo Female with a history of PE.   INR 1.57 today.  It is rising but still not at goal of 2-3  Goal of Therapy:  INR 2-3     Plan:  Will increase dose to 7.5 mg today and recheck INR in AM.  Pamala Duffel L 12/25/2012,8:50 AM

## 2012-12-25 NOTE — Consult Note (Signed)
Note reviewed and agreed with  

## 2012-12-25 NOTE — Progress Notes (Signed)
Patient ID: [name], female   DOB: Feb 09, 1971, 42 y.o.   MRN: [id number]  [facility] MD Progress Note  12/25/2012 1:43 PM [name]  MRN:  [id number]  Subjective:  "I am trying to drink more fluids. I was trying to diet before I came in so maybe that's why I'm not eating as much. You know I am fighting the battle of my life. I will not let the evil people hurt any of you."  Objective: Patient remains delusional,  psychotic, delusional, and grandiose. Her thought process remains bizarre and disorganized. Patient reports that she did not sleep well last night. She needs constant encouragement to drink fluids and eat food from her tray. Patient was observed drinking water when writer was in the room today.   DSM5: Schizophrenia Disorders:   DSM5:  AXIS I: Chronic Paranoid Schizophrenia               Post traumatic stress disorder AXIS II: Deferred  AXIS III:  Past Medical History   Diagnosis  Date   .  Pulmonary emboli    .  Cancer    .  Uterus cancer     AXIS IV: other psychosocial or environmental problems and problems related to social environment  AXIS V: 41-50 Serious Symptoms  ADL's:  Intact  Sleep: Poor  Appetite:  Fair  Suicidal Ideation:  denies Homicidal Ideation:  denies AEB (as evidenced by):  Psychiatric Specialty Exam: Review of Systems  Constitutional: Negative for malaise/fatigue.  HENT: Negative.   Eyes: Negative.   Respiratory: Negative.   Cardiovascular: Negative.   Gastrointestinal: Negative.   Genitourinary: Negative.   Musculoskeletal: Positive for myalgias and joint pain.  Skin: Negative.   Neurological: Negative.   Endo/Heme/Allergies: Negative.   Psychiatric/Behavioral: Positive for depression and hallucinations. Negative for suicidal ideas, memory loss and substance abuse. The patient is nervous/anxious and has insomnia.     Blood pressure 112/78, pulse 108, temperature 98 F (36.7 C), temperature source Oral, resp. rate 20, last menstrual  period 11/16/2012, SpO2 95.00%.There is no weight on file to calculate BMI.  General Appearance: Disheveled  Eye Contact::  fair  Speech:  alert  Volume:  Normal  Mood:    dysphoric  Affect:    constricted  Thought Process:  disorganized  Orientation:  Only to person  Thought Content:  delusional  Suicidal Thoughts:  denies  Homicidal Thoughts:  denies  Memory:  fair  Judgement: poor   Insight:  lacking  Psychomotor Activity:  normal  Concentration:  poor  Recall:  poor  Akathisia:  none  Handed:  right  AIMS (if indicated):     Assets:    Sleep:  Number of Hours: 2   Current Medications: Current Facility-Administered Medications  Medication Dose Route Frequency Provider Last Rate Last Dose  . alum & mag hydroxide-simeth (MAALOX/MYLANTA) 200-200-20 MG/5ML suspension 30 mL  30 mL Oral Q4H PRN Fransisca Kaufmann, NP      . LORazepam (ATIVAN) tablet 1 mg  1 mg Oral Q8H PRN Shuvon Rankin, NP   1 mg at 12/20/12 0425  . lurasidone (LATUDA) tablet 40 mg  40 mg Oral Q breakfast Mojeed Akintayo   40 mg at 12/20/12 0842  . magnesium hydroxide (MILK OF MAGNESIA) suspension 30 mL  30 mL Oral Daily PRN Fransisca Kaufmann, NP      . nicotine (NICODERM CQ - dosed in mg/24 hours) patch 21 mg  21 mg Transdermal Daily Shuvon Rankin, NP   21  mg at 12/20/12 0840  . traZODone (DESYREL) tablet 50 mg  50 mg Oral QHS PRN,MR X 1 Fransisca Kaufmann, NP      . warfarin (COUMADIN) tablet 7.5 mg  7.5 mg Oral ONCE-1800 Mojeed Akintayo      . Warfarin - Pharmacist Dosing Inpatient   Does not apply q1800 Mojeed Akintayo        Lab Results:  Results for orders placed during the hospital encounter of 12/22/12 (from the past 48 hour(s))  COMPREHENSIVE METABOLIC PANEL     Status: Abnormal   Collection Time    12/23/12  7:45 PM      Result Value Range   Sodium 136  135 - 145 mEq/L   Potassium 3.8  3.5 - 5.1 mEq/L   Comment: DELTA CHECK NOTED     REPEATED TO VERIFY   Chloride 100  96 - 112 mEq/L   CO2 25  19 - 32 mEq/L    Glucose, Bld 88  70 - 99 mg/dL   BUN 10  6 - 23 mg/dL   Creatinine, Ser 8.11  0.50 - 1.10 mg/dL   Calcium 8.9  8.4 - 91.4 mg/dL   Total Protein 6.8  6.0 - 8.3 g/dL   Albumin 3.5  3.5 - 5.2 g/dL   AST 26  0 - 37 U/L   ALT 16  0 - 35 U/L   Alkaline Phosphatase 68  39 - 117 U/L   Total Bilirubin 0.4  0.3 - 1.2 mg/dL   GFR calc non Af Amer 71 (*) >90 mL/min   GFR calc Af Amer 83 (*) >90 mL/min   Comment: (NOTE)     The eGFR has been calculated using the CKD EPI equation.     This calculation has not been validated in all clinical situations.     eGFR's persistently <90 mL/min signify possible Chronic Kidney     Disease.     Performed at [facility]  URINALYSIS, ROUTINE W REFLEX MICROSCOPIC     Status: None   Collection Time    12/24/12  5:59 PM      Result Value Range   Color, Urine YELLOW  YELLOW   APPearance CLEAR  CLEAR   Specific Gravity, Urine 1.013  1.005 - 1.030   pH 7.0  5.0 - 8.0   Glucose, UA NEGATIVE  NEGATIVE mg/dL   Hgb urine dipstick NEGATIVE  NEGATIVE   Bilirubin Urine NEGATIVE  NEGATIVE   Ketones, ur NEGATIVE  NEGATIVE mg/dL   Protein, ur NEGATIVE  NEGATIVE mg/dL   Urobilinogen, UA 0.2  0.0 - 1.0 mg/dL   Nitrite NEGATIVE  NEGATIVE   Leukocytes, UA NEGATIVE  NEGATIVE   Comment: MICROSCOPIC NOT DONE ON URINES WITH NEGATIVE PROTEIN, BLOOD, LEUKOCYTES, NITRITE, OR GLUCOSE <1000 mg/dL.     Performed at [facility]  BASIC METABOLIC PANEL     Status: Abnormal   Collection Time    12/25/12  6:30 AM      Result Value Range   Sodium 137  135 - 145 mEq/L   Potassium 3.7  3.5 - 5.1 mEq/L   Chloride 100  96 - 112 mEq/L   CO2 24  19 - 32 mEq/L   Glucose, Bld 99  70 - 99 mg/dL   BUN 14  6 - 23 mg/dL   Creatinine, Ser 7.82  0.50 - 1.10 mg/dL   Calcium 9.3  8.4 - 95.6 mg/dL   GFR calc non Af Denyse Dago  82 (*) >90 mL/min   GFR calc Af Amer >90  >90 mL/min   Comment: (NOTE)     The eGFR has been calculated using the CKD EPI equation.      This calculation has not been validated in all clinical situations.     eGFR's persistently <90 mL/min signify possible Chronic Kidney     Disease.     Performed at [facility]  TSH     Status: None   Collection Time    12/25/12  6:30 AM      Result Value Range   TSH 1.096  0.350 - 4.500 uIU/mL   Comment: Performed at [facility]  PROTIME-INR     Status: Abnormal   Collection Time    12/25/12  6:30 AM      Result Value Range   Prothrombin Time 18.3 (*) 11.6 - 15.2 seconds   INR 1.57 (*) 0.00 - 1.49   Comment: Performed at [facility]    Physical Findings: AIMS: Facial and Oral Movements Muscles of Facial Expression: None, normal Lips and Perioral Area: None, normal Jaw: None, normal Tongue: None, normal, ,  ,  ,    CIWA:    COWS:     Treatment Plan Summary: Daily contact with patient to assess and evaluate symptoms and progress in treatment Medication management  Plan: 1. Continue crisis management and stabilization. 2. Medication management to reduce current symptoms to base line and improve patient's overall level of functioning 3. Treat health problems as indicated. Patient has order for strict I & O's. Internal medicine consult completed yesterday. UA negative for UTI.  4. Develop treatment plan to decrease risk of relapse upon discharge and the need for readmission. 5. Psycho-social education regarding relapse prevention and self care. 6. Health care follow up as needed for medical problems. 7. Continue home medications where appropriate. 8. Continue Prolixin  5mg  po BID for psychosis/and delusion 9. Continue Tripletal 300mg  po BID for mood 10. Increase Trazodone to 100 mg po Qhs at bedtime. 11. Pharmacy is managing patient's coumadin daily.  Medical Decision Making Problem Points:  Established problem, improving slowly (2) Data Points:  Review or order medicine tests (1)  I certify that inpatient services furnished  can reasonably be expected to improve the patient's condition.  Fransisca Kaufmann, NP-C 12/25/2012 1:43 PM

## 2012-12-25 NOTE — Progress Notes (Addendum)
Chemistries reviewed. No evidence of dehydration. BUN to creatinine ratio is relatively normal and she is not hypernatremic. Nursing staff reports that she is eating and drinking and voiding light yellow urine. Followup TSH when available. No further recommendations.  Brenley Priore 12/25/2012 8:26 AM

## 2012-12-25 NOTE — Progress Notes (Signed)
1 - 1 note D. Pt in bed at this time, resting comfortably and no acute distress noted. Pt spoke about how she is feeling better, also spoke about how her leg is still jumpy when she walks and feels safer using a wheelchair. Pt spoke about how she has been attending group today, and also spoke about how she has not been sleeping well at night. Pt still has disorganized thoughts but has not endorsed any suicidal thoughts this evening. A. Support and encouragement provided. R. Safety maintained, will continue to monitor.

## 2012-12-25 NOTE — Progress Notes (Addendum)
D: Pt presents with a flat affect. She reports having a better day today. She is denying any SI/HI/AVH.  She is reporting generalized pain at a level 2 out of 10. Pt given comfort and support and was informed of her opportunity to request pain medications as needed. Pt was proud to report her ability to attend one group toady for 35 minutes. This evening she has decided to stay back from karaoke and rest. Pt informed to avoid attempting to get out of bed alone. She has been encouraged to seek staff when in need of ambulating. Sitter is at pt's bedside. Pt still presents disorganized in conversation.  A: Writer administered scheduled medications to pt. Continued support and availability as needed was extended to this pt. 1:1 continuous observation remains in effect for this pt. I&O is also being recorded for this pt.  R: No adverse drug reactions noted. Pt receptive to treatment. Pt remains safe at this time.

## 2012-12-25 NOTE — [facility] Group Notes (Signed)
[facility] Group Notes:  (Counselor/Nursing/MHT/Case Management/Adjunct)  12/25/2012 1:15PM  Type of Therapy:  Group Therapy  Participation Level:  Active  Participation Quality: Brief  Affect:  Flat  Cognitive:  Oriented  Insight:  Improving  Engagement in Group:  Limited  Engagement in Therapy:  Limited  Modes of Intervention:  Discussion, Exploration and Socialization  Summary of Progress/Problems: The topic for group was balance in life.  Pt participated in the discussion about when their life was in balance and out of balance and how this feels.  Pt discussed ways to get back in balance and short term goals they can work on to get where they want to be. Marcus was appropriate throughout group, and this is the first group she has attended, for which she was congratulated.  She indicated that her grandmother, who passed away in 01-Sep-1999, was important to her.  Stated that she feels balance since "I'm in here in group."  She identified previous feelings of imbalance and hopelessness.  She opted leave after being with [place] for 10 minutes.   Daryel Gerald B 12/25/2012 2:21 PM

## 2012-12-25 NOTE — Progress Notes (Signed)
Rn 1:1 note 2000 9/25  D: Pt in bed resting on her left side. Pt was administered 100 mg of trazodone per orders. Pt was informed of the indications. Pt was receptive to the information. Pt was given assistance in sitting up to take her medication. Medication was administered appropriately, with no swallowing issues noted. At this time there was no signs of distress noted.  A: Sitter at bedside. 1:1 Continuous observation remains in effect for this pt.  R: Pt remains safe at this time.

## 2012-12-26 DIAGNOSIS — F200 Paranoid schizophrenia: Secondary | ICD-10-CM

## 2012-12-26 LAB — PROTIME-INR
INR: 1.45 (ref 0.00–1.49)
Prothrombin Time: 17.3 seconds — ABNORMAL HIGH (ref 11.6–15.2)

## 2012-12-26 MED ORDER — DIAZEPAM 5 MG PO TABS
5.0000 mg | ORAL_TABLET | Freq: Two times a day (BID) | ORAL | Status: DC
  Administered 2012-12-26 – 2013-01-02 (×13): 5 mg via ORAL
  Filled 2012-12-26 (×13): qty 1

## 2012-12-26 MED ORDER — WARFARIN SODIUM 7.5 MG PO TABS
7.5000 mg | ORAL_TABLET | Freq: Once | ORAL | Status: AC
  Administered 2012-12-26: 7.5 mg via ORAL
  Filled 2012-12-26: qty 1

## 2012-12-26 NOTE — Progress Notes (Signed)
ANTICOAGULATION CONSULT NOTE - Follow Up Consult  Pharmacy Consult for coumadin Indication: h/o PE  Allergies  Allergen Reactions  . Darvocet [Propoxyphene-Acetaminophen] Nausea And Vomiting and Other (See Comments)    Shaking     Vital Signs: Temp: 98.3 F (36.8 C) (09/26 0700) BP: 119/81 mmHg (09/26 0700) Pulse Rate: 89 (09/26 0700)  Labs:  Recent Labs  12/23/12 1945 12/25/12 0630 12/26/12 0625  LABPROT  --  18.3* 17.3*  INR  --  1.57* 1.45  CREATININE 0.96 0.86  --     The CrCl is unknown because both a height and weight (above a minimum accepted value) are required for this calculation.   Medications:  Scheduled:  . benztropine  1 mg Oral BID  . diazepam  5 mg Oral BID PC  . fluPHENAZine  5 mg Oral BID  . lactose free nutrition  237 mL Oral TID WC  . naproxen  250 mg Oral BID WC  . nicotine  21 mg Transdermal Daily  . OXcarbazepine  300 mg Oral BID  . traZODone  100 mg Oral Q2000  . Warfarin - Pharmacist Dosing Inpatient   Does not apply q1800    Assessment: INR below goal. Coumadin charted as given.   Goal of Therapy:  INR 2-3    Plan:  Coumadin 7.5 mg x 1 today PT/INR in am   [name] 12/26/2012,1:21 PM

## 2012-12-26 NOTE — Progress Notes (Signed)
Seen and agreed. Kaede Clendenen, MD 

## 2012-12-26 NOTE — Progress Notes (Signed)
RN 1:1 note 0400 9/26  D: Pt in bed resting with sitter at bedside. Pt offered fluids. Fluids were given. Pt appears to be in no signs of distress at this time.  A: 1:1 continuous observation remains in effect for this pt.  R: Pt remains safe at this time.

## 2012-12-26 NOTE — [facility] Group Notes (Signed)
[facility] LCSW Group Therapy  12/26/2012  1:05 PM  Type of Therapy:  Group therapy  Participation Level:  Did not attend  Having spasms in bed.   Summary of Progress/Problems:  Chaplain was here to lead a group on themes of hope and courage.  Daryel Gerald B 12/26/2012 1:09 PM

## 2012-12-26 NOTE — [facility] Group Notes (Signed)
[facility] LCSW Aftercare Discharge Planning Group Note   12/26/2012 7:55 AM  Participation Quality:  Found pt in bed, awake, alert, engageable.  Good mood, smiling.  Declined to come to bed.    [name] B

## 2012-12-26 NOTE — Progress Notes (Signed)
Nursing 1:1 note D:Pt observed lying in bed . RR even and unlabored. No distress noted.Pt denies SI/HI/AVH/pain at this time. Pt state states she feels better from yesterday and she did walk some today.   A: 1:1 observation continues for safety  R: pt remains safe

## 2012-12-26 NOTE — Progress Notes (Signed)
nrsg 1:1 1200 D Pt cont to lay in her bed. She visits with MHT sitter ad lib. SHe takes her AM meds this morning without diff. SHe  Says " I can't stop this..my  leg and arm on my right side they shake and tremble and I cannot stop it..." " I have an anxiety disorder and that's why I need help walking..my legs, they give out on me".      A Pt is encouraged to sit up in the bed and declines to do so...she maintains poor eye contact and she does not make since, logic, when she begins to speak, ie she changes from subject to subject. She is seen lying in the bed on her side with her right leg jumping / wiggling and she says " that's my anxiety".      R 1:1 contd and POC maintained.

## 2012-12-26 NOTE — Progress Notes (Signed)
Brief Nutrition Note  Spoke with patient and tech.  Patient on regular diet with meals in room secondary to fall risk.  Supervision is 1:1.  Boost tid.  Patient reports eating approximately 25% of her meals and drinking fluid.  Is "hungrier than I have been" prior to lunch today.   Encouraged continued improvement in intake of meals and supplements.  Please consult as needed.  Oran Rein, RD, LDN Clinical Inpatient Dietitian Pager:  [phone] Weekend and after hours pager:  [phone]

## 2012-12-26 NOTE — Progress Notes (Signed)
1;1 for 1600   D Pt remains on a 1:1 for safety.Status unchanged;primarily bedbound due to pt stating she is " not able" to ambulate  Due to "anxiety disorder' and muscle "spasms". Her affect is pained, tense and uncomfortable. 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 SHe is obviously in distress, but denies when this is asked of her...   A 1;1 cont per MD order. Pt stays in bed, per her request.She is able to ambulate (slowly) with 1:1's assistance.   R Safety is in place and POC maintained.

## 2012-12-26 NOTE — Progress Notes (Signed)
RN 1:1 note  0000 9/26  D: Pt in bed resting with eyes closed in supine position. Respirations even and unlabored. Pt appears to be in no signs of distress at this time. Sitter at bedside. A: 1:1 continuous observations remains for this pt.  R: Pt remains safe at this time.

## 2012-12-26 NOTE — Progress Notes (Signed)
Recreation Therapy Notes   Date: 09.26.2014 Time: 9:30am Location: [address]  Group Topic: Coping Skills  Goal Area(s) Addresses:  Patient will successfully express themselves using art.   Behavioral Response: Did not attend  Hannalee Castor L Maryalice Pasley, LRT/CTRS  Marialena Wollen L 12/26/2012 12:52 PM 

## 2012-12-26 NOTE — Progress Notes (Signed)
Patient ID: [name], female   DOB: 06-Aug-1970, 42 y.o.   MRN: [id number]   [facility] MD Progress Note  12/26/2012 10:02 AM [name]  MRN:  [id number]  Subjective:  "I am still feeling anxious, having difficulty sleeping due to muscle spasms. I used to take Valium to help me with my spasms and anxiety."  Objective: Patient remains anxious, psychotic, delusional, and grandiose. She reports frequent muscle spasm which is preventing her from getting a decent night time sleep.Her thought process remains bizarre and disorganized. She needs constant encouragement to drink fluids and eat food from her tray. She says she ate about 25% of her food today. DSM5: Schizophrenia Disorders:   DSM5:  AXIS I: Chronic Paranoid Schizophrenia               Post traumatic stress disorder AXIS II: Deferred  AXIS III:  Past Medical History   Diagnosis  Date   .  Pulmonary emboli    .  Cancer    .  Uterus cancer     AXIS IV: other psychosocial or environmental problems and problems related to social environment  AXIS V: 41-50 Serious Symptoms  ADL's:  Intact  Sleep: Poor  Appetite:  Fair  Suicidal Ideation:  denies Homicidal Ideation:  denies AEB (as evidenced by):  Psychiatric Specialty Exam: Review of Systems  Constitutional: Negative for malaise/fatigue.  HENT: Negative.   Eyes: Negative.   Respiratory: Negative.   Cardiovascular: Negative.   Gastrointestinal: Negative.   Genitourinary: Negative.   Musculoskeletal: Positive for myalgias and joint pain.  Skin: Negative.   Neurological: Negative.   Endo/Heme/Allergies: Negative.   Psychiatric/Behavioral: Positive for depression and hallucinations. Negative for suicidal ideas, memory loss and substance abuse. The patient is nervous/anxious and has insomnia.     Blood pressure 119/81, pulse 89, temperature 98.3 F (36.8 C), temperature source Oral, resp. rate 20, last menstrual period 11/16/2012, SpO2 95.00%.There is no weight on file to  calculate BMI.  General Appearance: Disheveled  Eye Contact::  fair  Speech:  alert  Volume:  Normal  Mood:    dysphoric  Affect:    constricted  Thought Process:  disorganized  Orientation:  Only to person  Thought Content:  delusional  Suicidal Thoughts:  denies  Homicidal Thoughts:  denies  Memory:  fair  Judgement: poor   Insight:  lacking  Psychomotor Activity:  normal  Concentration:  poor  Recall:  poor  Akathisia:  none  Handed:  right  AIMS (if indicated):     Assets:    Sleep:  Number of Hours: 4   Current Medications: Current Facility-Administered Medications  Medication Dose Route Frequency Provider Last Rate Last Dose  . alum & mag hydroxide-simeth (MAALOX/MYLANTA) 200-200-20 MG/5ML suspension 30 mL  30 mL Oral Q4H PRN Fransisca Kaufmann, NP      . LORazepam (ATIVAN) tablet 1 mg  1 mg Oral Q8H PRN Shuvon Rankin, NP   1 mg at 12/20/12 0425  . lurasidone (LATUDA) tablet 40 mg  40 mg Oral Q breakfast Mekenna Finau   40 mg at 12/20/12 0842  . magnesium hydroxide (MILK OF MAGNESIA) suspension 30 mL  30 mL Oral Daily PRN Fransisca Kaufmann, NP      . nicotine (NICODERM CQ - dosed in mg/24 hours) patch 21 mg  21 mg Transdermal Daily Shuvon Rankin, NP   21 mg at 12/20/12 0840  . traZODone (DESYREL) tablet 50 mg  50 mg Oral QHS PRN,MR X 1 Vernona Rieger  Earlene Plater, NP      . warfarin (COUMADIN) tablet 7.5 mg  7.5 mg Oral ONCE-1800 Niya Behler      . Warfarin - Pharmacist Dosing Inpatient   Does not apply q1800 Masa Lubin        Lab Results:  Results for orders placed during the hospital encounter of 12/22/12 (from the past 48 hour(s))  URINALYSIS, ROUTINE W REFLEX MICROSCOPIC     Status: None   Collection Time    12/24/12  5:59 PM      Result Value Range   Color, Urine YELLOW  YELLOW   APPearance CLEAR  CLEAR   Specific Gravity, Urine 1.013  1.005 - 1.030   pH 7.0  5.0 - 8.0   Glucose, UA NEGATIVE  NEGATIVE mg/dL   Hgb urine dipstick NEGATIVE  NEGATIVE   Bilirubin Urine NEGATIVE   NEGATIVE   Ketones, ur NEGATIVE  NEGATIVE mg/dL   Protein, ur NEGATIVE  NEGATIVE mg/dL   Urobilinogen, UA 0.2  0.0 - 1.0 mg/dL   Nitrite NEGATIVE  NEGATIVE   Leukocytes, UA NEGATIVE  NEGATIVE   Comment: MICROSCOPIC NOT DONE ON URINES WITH NEGATIVE PROTEIN, BLOOD, LEUKOCYTES, NITRITE, OR GLUCOSE <1000 mg/dL.     Performed at [facility]  BASIC METABOLIC PANEL     Status: Abnormal   Collection Time    12/25/12  6:30 AM      Result Value Range   Sodium 137  135 - 145 mEq/L   Potassium 3.7  3.5 - 5.1 mEq/L   Chloride 100  96 - 112 mEq/L   CO2 24  19 - 32 mEq/L   Glucose, Bld 99  70 - 99 mg/dL   BUN 14  6 - 23 mg/dL   Creatinine, Ser 1.61  0.50 - 1.10 mg/dL   Calcium 9.3  8.4 - 09.6 mg/dL   GFR calc non Af Amer 82 (*) >90 mL/min   GFR calc Af Amer >90  >90 mL/min   Comment: (NOTE)     The eGFR has been calculated using the CKD EPI equation.     This calculation has not been validated in all clinical situations.     eGFR's persistently <90 mL/min signify possible Chronic Kidney     Disease.     Performed at [facility]  TSH     Status: None   Collection Time    12/25/12  6:30 AM      Result Value Range   TSH 1.096  0.350 - 4.500 uIU/mL   Comment: Performed at [facility]  PROTIME-INR     Status: Abnormal   Collection Time    12/25/12  6:30 AM      Result Value Range   Prothrombin Time 18.3 (*) 11.6 - 15.2 seconds   INR 1.57 (*) 0.00 - 1.49   Comment: Performed at [facility]  PROTIME-INR     Status: Abnormal   Collection Time    12/26/12  6:25 AM      Result Value Range   Prothrombin Time 17.3 (*) 11.6 - 15.2 seconds   INR 1.45  0.00 - 1.49   Comment: Performed at [facility]    Physical Findings: AIMS: Facial and Oral Movements Muscles of Facial Expression: None, normal Lips and Perioral Area: None, normal Jaw: None, normal Tongue: None, normal, ,  ,  ,    CIWA:    COWS:      Treatment Plan  Summary: Daily contact with patient to assess and evaluate symptoms and progress in treatment Medication management  Plan: 1. Continue crisis management and stabilization. 2. Medication management to reduce current symptoms to base line and improve patient's overall level of functioning 3. Treat health problems as indicated. Patient has order for strict I & O's4. Develop treatment plan to decrease risk of relapse upon discharge and the need for readmission. 4. Psycho-social education regarding relapse prevention and self care. 5. Health care follow up as needed for medical problems. 6. Continue home medications where appropriate. 7. Continue Prolixin  5mg  po BID for psychosis/and delusion 8. Continue Tripletal 300mg  po BID for mood 19. Continue Trazodone to 100 mg po Qhs at bedtime. 10. Pharmacy is managing patient's coumadin daily.  11 Add Valium 5mg  po BID for anxiety/insomnia/muscle spasms. 12 Continue patient 1:1 observation for fall risk Medical Decision Making Problem Points:  Established problem, improving slowly (2) Data Points:  Review or order medicine tests (1)  I certify that inpatient services furnished can reasonably be expected to improve the patient's condition.  Thedore Mins, MD 12/26/2012 10:02 AM

## 2012-12-26 NOTE — Progress Notes (Signed)
Nrsg 1:1 0800 D  Pt is seen lying on her side in her bed. She is pleasant upon approach. She is seen eating her breakfast. 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 She has tobacco stained very long curved fingernails. She has a flat, uncomfortable affect about her. She takes her AM medicines as scheduled.     A She denies audit, vis halluc. SHe  Is seen lying on her side, with her right arm bent and as she interacts with this nurse, she is seen intermitently jerking her right arm and leg.  She tells this nurse " I have this anxiety disorder.Marland Kitchenibuprofen keep shaking and then I can't walk.     R Safety is in place . Pt denies SI, HI . POC cont.

## 2012-12-27 LAB — PROTIME-INR
INR: 1.4 (ref 0.00–1.49)
Prothrombin Time: 16.8 seconds — ABNORMAL HIGH (ref 11.6–15.2)

## 2012-12-27 MED ORDER — CALCIUM POLYCARBOPHIL 625 MG PO TABS
1250.0000 mg | ORAL_TABLET | Freq: Every day | ORAL | Status: DC
  Administered 2012-12-27 – 2013-01-02 (×6): 1250 mg via ORAL
  Filled 2012-12-27 (×8): qty 2

## 2012-12-27 MED ORDER — WARFARIN SODIUM 10 MG PO TABS
10.0000 mg | ORAL_TABLET | Freq: Once | ORAL | Status: AC
  Administered 2012-12-27: 10 mg via ORAL
  Filled 2012-12-27: qty 1

## 2012-12-27 NOTE — [facility] Group Notes (Signed)
The focus of this group is to help patients establish daily goals to achieve during treatment and discuss how the patient can incorporate goal setting into their daily lives to aide in recovery. 

## 2012-12-27 NOTE — Progress Notes (Signed)
Nursing 1:1 note- Patient is alert this morning.  AM care given by MHT.  Continues to be guarded and seclusive to room.Meal delivered to patient room.  Compliant with scheduled medications.  Continue POC which is 1:1 observation for safety. Safety maintained.

## 2012-12-27 NOTE — Progress Notes (Signed)
Nursing 1:1 note Patient is alert and oriented.  Continues to use w/c for gait stability.  Did not attend recreation group.  Compliant with scheduled medications and taking adequate fluids.  Refer to I &O.  Continue 1:1 for high fall risk.

## 2012-12-27 NOTE — Progress Notes (Signed)
ANTICOAGULATION CONSULT NOTE - Follow Up Consult  Pharmacy Consult for Coumadin Indication: History of PE  Allergies  Allergen Reactions  . Darvocet [Propoxyphene-Acetaminophen] Nausea And Vomiting and Other (See Comments)    Shaking     Patient Measurements:     Vital Signs:    Labs:  Recent Labs  12/25/12 0630 12/26/12 0625 12/27/12 0658  LABPROT 18.3* 17.3* 16.8*  INR 1.57* 1.45 1.40  CREATININE 0.86  --   --     The CrCl is unknown because both a height and weight (above a minimum accepted value) are required for this calculation.   Medications:  Scheduled:  . benztropine  1 mg Oral BID  . diazepam  5 mg Oral BID PC  . fluPHENAZine  5 mg Oral BID  . lactose free nutrition  237 mL Oral TID WC  . naproxen  250 mg Oral BID WC  . nicotine  21 mg Transdermal Daily  . OXcarbazepine  300 mg Oral BID  . traZODone  100 mg Oral Q2000  . warfarin  10 mg Oral ONCE-1800  . Warfarin - Pharmacist Dosing Inpatient   Does not apply q1800    Assessment:  Patient's INR is falling rather than rising.  I talked with patient and cannot find any reason for this.  The Boost Plus she is drinking has some Vitamin K in it but not enough to prevent the INR from going up.  The only drug she is taking that interacts is Trazodone and this is not a major interaction.  No other problems noted except the patient is not eating well.  Goal of Therapy:  INR 2-3     Plan: Will give a 10 mg dose of Coumadin dose tonight and recheck INR in AM.   Pamala Duffel L 12/27/2012,11:23 AM

## 2012-12-27 NOTE — Progress Notes (Signed)
[facility] Group Notes:  (Nursing/MHT/Case Management/Adjunct)  Date:  12/27/2012  Time:  9:14 PM  Type of Therapy:  Psychoeducational Skills  Participation Level:  Active  Participation Quality:  Redirectable  Affect:  Lethargic  Cognitive:  Confused  Insight:  None  Engagement in Group:  None  Modes of Intervention:  Limit-setting  Summary of Progress/Problems:Pt didnt attended group  Octavio Manns 12/27/2012, 9:14 PM

## 2012-12-27 NOTE — Progress Notes (Signed)
Nursing 1:1 note D:Pt observed resting in bed with eyes open. RR even and unlabored. No distress noted. A: 1:1 observation continues for safety  R: pt remains safe 

## 2012-12-27 NOTE — [facility] Group Notes (Signed)
[facility] Group Notes:  (Clinical Social Work)  12/27/2012  11:15-11:45AM  Summary of Progress/Problems:   The main focus of today's process group was for the patient to identify ways in which they have in the past sabotaged their own recovery and reasons they may have done this/what they received from doing it.  We then worked to identify a specific plan to avoid doing this when discharged from the hospital for this admission.  The patient expressed that she sometimes forgets to eat 1 green thing daily in order to get appropriate Vitamin K.  Her 1:1 staff person took her to the restroom and she did not return.  Type of Therapy:  Group Therapy - Process  Participation Level:  Minimal  Participation Quality:  Attentive  Affect:  Blunted  Cognitive:  Oriented  Insight:  Developing/Improving  Engagement in Therapy:  Developing/Improving  Modes of Intervention:  Clarification, Education, Exploration, Discussion  Ambrose Mantle, LCSW 12/27/2012, 1:19 PM

## 2012-12-27 NOTE — Progress Notes (Addendum)
Nursing 1:1 note D:Pt was given OJ earlier, that was sent over from Psych ED.   Pt observed sleeping in bed with eyes closed. RR even and unlabored. No distress noted  A: 1:1 observation continues for safety  R: pt remains safe

## 2012-12-27 NOTE — Progress Notes (Signed)
Nursing 1:1 note D:Pt observed sleeping in bed with eyes closed. RR even and unlabored. No distress noted. Pt stated earlier that she felt better than she has been. She was using the wheelchair today, but overall feels better.   A: 1:1 observation continues for safety  R: pt remains safe

## 2012-12-27 NOTE — Progress Notes (Signed)
D- Patient did attend group therapy this am. She continues to have unstable gait and uses w/c.  C/O constipation. Encouraged to increase fluids/fiber.  Denies SI.  Continues to choose to isolate to room.  A- Support and encouragement offered.  Continue to encourage interaction.  Fall precautions in place.  Continue 1:1 for safety.  R- Safety maintained.

## 2012-12-27 NOTE — Progress Notes (Signed)
Patient ID: [name], female   DOB: [birth year], 42 y.o.   MRN: [id number]   [facility] MD Progress Note  12/27/2012 9:53 AM [name]  MRN:  [id number] Subjective:  Patient states she is doing ok today. She reported sleeping poorly but also noted that she was sleeping during the day. She denied any further leg cramps or muscle spasms. Notes that her depression is mild. Her anxiety is low, she is fully oriented 3/3, denies racing thoughts or paranoia.  Objective: She is alert and oriented x 3, denies SI/HI and says no AVH, she has improved a great deal since her admission. DSM5: Schizophrenia Disorders:   DSM5:  AXIS I: Chronic Paranoid Schizophrenia               Post traumatic stress disorder AXIS II: Deferred  AXIS III:  Past Medical History   Diagnosis  Date   .  Pulmonary emboli    .  Cancer    .  Uterus cancer     AXIS IV: other psychosocial or environmental problems and problems related to social environment  AXIS V: 41-50 Serious Symptoms  ADL's:  Intact  Sleep: Poor  Appetite:  Fair  Suicidal Ideation:  denies Homicidal Ideation:  denies AEB (as evidenced by):  Psychiatric Specialty Exam: Review of Systems  Constitutional: Negative for malaise/fatigue.  HENT: Negative.   Eyes: Negative.   Respiratory: Negative.   Cardiovascular: Negative.   Gastrointestinal: Negative.   Genitourinary: Negative.   Musculoskeletal: Positive for myalgias and joint pain.  Skin: Negative.   Neurological: Negative.   Endo/Heme/Allergies: Negative.   Psychiatric/Behavioral: Positive for depression and hallucinations. Negative for suicidal ideas, memory loss and substance abuse. The patient is nervous/anxious and has insomnia.     Blood pressure 119/81, pulse 89, temperature 98.3 F (36.8 C), temperature source Oral, resp. rate 20, last menstrual period 11/16/2012, SpO2 95.00%.There is no weight on file to calculate BMI.  General Appearance: Disheveled  Eye Contact::  fair  Speech:   alert  Volume:  Normal  Mood:    dysphoric  Affect:    constricted  Thought Process:  disorganized  Orientation:  Only to person  Thought Content:  delusional  Suicidal Thoughts:  denies  Homicidal Thoughts:  denies  Memory:  fair  Judgement: poor   Insight:  lacking  Psychomotor Activity:  normal  Concentration:  poor  Recall:  poor  Akathisia:  none  Handed:  right  AIMS (if indicated):     Assets:    Sleep:  Number of Hours: 2.75   Current Medications: Current Facility-Administered Medications  Medication Dose Route Frequency Provider Last Rate Last Dose  . alum & mag hydroxide-simeth (MAALOX/MYLANTA) 200-200-20 MG/5ML suspension 30 mL  30 mL Oral Q4H PRN Fransisca Kaufmann, NP      . LORazepam (ATIVAN) tablet 1 mg  1 mg Oral Q8H PRN Shuvon Rankin, NP   1 mg at 12/20/12 0425  . lurasidone (LATUDA) tablet 40 mg  40 mg Oral Q breakfast Mojeed Akintayo   40 mg at 12/20/12 0842  . magnesium hydroxide (MILK OF MAGNESIA) suspension 30 mL  30 mL Oral Daily PRN Fransisca Kaufmann, NP      . nicotine (NICODERM CQ - dosed in mg/24 hours) patch 21 mg  21 mg Transdermal Daily Shuvon Rankin, NP   21 mg at 12/20/12 0840  . traZODone (DESYREL) tablet 50 mg  50 mg Oral QHS PRN,MR X 1 Fransisca Kaufmann, NP      .  warfarin (COUMADIN) tablet 7.5 mg  7.5 mg Oral ONCE-1800 Mojeed Akintayo      . Warfarin - Pharmacist Dosing Inpatient   Does not apply q1800 Mojeed Akintayo        Lab Results:  Results for orders placed during the hospital encounter of 12/22/12 (from the past 48 hour(s))  PROTIME-INR     Status: Abnormal   Collection Time    12/26/12  6:25 AM      Result Value Range   Prothrombin Time 17.3 (*) 11.6 - 15.2 seconds   INR 1.45  0.00 - 1.49   Comment: Performed at [facility]  PROTIME-INR     Status: Abnormal   Collection Time    12/27/12  6:58 AM      Result Value Range   Prothrombin Time 16.8 (*) 11.6 - 15.2 seconds   INR 1.40  0.00 - 1.49   Comment: Performed at [facility]  POTASSIUM     Status: None   Collection Time    12/27/12  6:58 AM      Result Value Range   Potassium 4.2  3.5 - 5.1 mEq/L   Comment: Performed at [facility]  MAGNESIUM     Status: None   Collection Time    12/27/12  6:58 AM      Result Value Range   Magnesium 1.9  1.5 - 2.5 mg/dL   Comment: Performed at [facility]    Physical Findings: AIMS: Facial and Oral Movements Muscles of Facial Expression: None, normal Lips and Perioral Area: None, normal Jaw: None, normal Tongue: None, normal, ,  ,  ,    CIWA:    COWS:     Treatment Plan Summary: Daily contact with patient to assess and evaluate symptoms and progress in treatment Medication management  Plan: 1. Continue crisis management and stabilization. 2. Medication management to reduce current symptoms to base line and improve patient's overall level of functioning 3. Treat health problems as indicated. Patient has order for strict I & O's4. Develop treatment plan to decrease risk of relapse upon discharge and the need for readmission. 4. Psycho-social education regarding relapse prevention and self care. 5. Health care follow up as needed for medical problems. 6. Continue home medications where appropriate. 7. Continue Prolixin  5mg  po BID for psychosis/and delusion 8. Continue Tripletal 300mg  po BID for mood 19. Continue Trazodone to 100 mg po Qhs at bedtime. 10. Pharmacy is managing patient's coumadin daily.  11  Continue Valium 5mg  po BIDforanxiety/insomnia/muscle spasms. 12 Continue patient 1:1 observation for fall risk 13. Patient is encouraged to walk behind the wheel chair for balance and stability. Will encourage her to use a walker rather than a wheelchair at this point to decrease her deconditioning. 14. Must attend all groups and must avoid daytime sleeping.  Medical Decision Making Problem Points:  Established problem, improving slowly (2) Data  Points:  Review or order medicine tests (1)  I certify that inpatient services furnished can reasonably be expected to improve the patient's condition.  Rona Ravens. Mashburn RPAC 4:42 PM 12/27/2012  Discussed with provider. Reviewed note. Agree with above findings, assessment and plan.   Jacqulyn Cane, M.D.  12/27/2012 11:22 PM

## 2012-12-28 MED ORDER — WARFARIN SODIUM 10 MG PO TABS
10.0000 mg | ORAL_TABLET | Freq: Once | ORAL | Status: AC
  Administered 2012-12-28: 10 mg via ORAL
  Filled 2012-12-28: qty 1

## 2012-12-28 NOTE — Progress Notes (Signed)
Nursing 1:1 note D:Pt observed sleeping in bed with eyes closed. RR even and unlabored. No distress noted. A: 1:1 observation continues for safety  R: pt remains safe  

## 2012-12-28 NOTE — Progress Notes (Signed)
Did not attend Group 

## 2012-12-28 NOTE — [facility] Group Notes (Signed)
[facility] Group Notes:  (Nursing/MHT/Case Management/Adjunct)  Date:  12/28/2012  Time:  1000  Type of Therapy:  Nurse Education  Participation Level:  Did Not Attend  Participation Quality:    Affect:    Cognitive:    Insight:    Engagement in Group:    Modes of Intervention:    Summary of Progress/Problems:  Cresenciano Lick 12/28/2012, 1:04 PM

## 2012-12-28 NOTE — Progress Notes (Signed)
Upon relieving tech for a few minutes, pt observed responding to internal Stimuli. When asked about it pt denied. Pt appears to be responding to tactile hallucinations along with AH as well. Pt does a good job during the day of seeming not to be responding, but pt seems to be responding to stimuli continuously.

## 2012-12-28 NOTE — Progress Notes (Signed)
ANTICOAGULATION CONSULT NOTE - Follow Up Consult  Pharmacy Consult for Coumadin Indication: History of PE  Allergies  Allergen Reactions  . Darvocet [Propoxyphene-Acetaminophen] Nausea And Vomiting and Other (See Comments)    Shaking     Patient Measurements:   Heparin Dosing Weight:   Vital Signs:    Labs:  Recent Labs  12/26/12 0625 12/27/12 0658 12/28/12 0648  LABPROT 17.3* 16.8* 19.0*  INR 1.45 1.40 1.64*    The CrCl is unknown because both a height and weight (above a minimum accepted value) are required for this calculation.   Medications:  Scheduled:  . benztropine  1 mg Oral BID  . diazepam  5 mg Oral BID PC  . fluPHENAZine  5 mg Oral BID  . lactose free nutrition  237 mL Oral TID WC  . naproxen  250 mg Oral BID WC  . nicotine  21 mg Transdermal Daily  . OXcarbazepine  300 mg Oral BID  . polycarbophil  1,250 mg Oral Daily  . traZODone  100 mg Oral Q2000  . warfarin  10 mg Oral ONCE-1800  . Warfarin - Pharmacist Dosing Inpatient   Does not apply q1800    Assessment: Patient's INR increased to 1.64 after a 10 mg dose of Coumadin last night.  The Boost Plus she is drinking may be the reason her INR has not been increasing.  Talked with patient and told her that upon discharge she may need to go back to a lower dose of Coumadin it she goes back to her normal diet and stops drinking Boost Plus.   Goal of Therapy:  INR 2-3    Plan:  Will give Coumadin 10 mg today and recheck INR in AM.  Malva Cogan 12/28/2012,8:36 AM

## 2012-12-28 NOTE — Progress Notes (Signed)
1 - 1 note D. Pt has been up and visible this afternoon in the milieu. Pt has been in dayroom, pt cordially greeted staff this afternoon and spoke about how she has been feeling better. Pt did complain of some mild pain in her right elbow. Pt had another episode of her leg spasming while she was seated in the wheelchair but was able, with staff assistance, to pull through the episode unscathed. Pt received all medications this evening without incident. A. Support and encouragement provided for patient, medication education provided. R. Pt verbalized understanding, safety maintained.

## 2012-12-28 NOTE — Progress Notes (Addendum)
Nursing 1:1 note D:Pt observed lying in bed. RR even and unlabored. No distress noted. A: 1:1 observation continues for safety  R: pt remains safe  

## 2012-12-28 NOTE — Progress Notes (Signed)
0930  Patient in bed and 1:1 present.  Patient denied SI and HI.  Denied A/V hallucinations.  Stated her right elbow hurts "alittle" where she fell a couple of days ago.  Patient was up in her wheelchair in hallway earlier with 1:1 present.  Stated she had blood clots couple years ago and that is why she has spasms in her right leg and hand.  Was given valium per MD orders.  I&O's continue, 1:1 has information.  Patient has no signs/symptoms of pain/discomforted noted on patient's face or body movements.  Respirations even and unlabored.  Will continue to monitor patient for safety with 1:1 per MD orders.  Safety maintained.

## 2012-12-28 NOTE — [facility] Group Notes (Signed)
[facility] Group Notes: (Clinical Social Work)   12/28/2012      Type of Therapy:  Group Therapy   Participation Level:  Did Not Attend    Ambrose Mantle, LCSW 12/28/2012, 12:10 PM

## 2012-12-28 NOTE — Progress Notes (Signed)
Nursing 1:1 note-  [name] is quiet and seclusive.  No group attendance this shift.  Rates depression and hopelessness at 1.  Denies SI.  C/O "tremors".  Reports "fair" sleep,"improving" appetite, and "good" ability to pay attention. Support off

## 2012-12-28 NOTE — Progress Notes (Signed)
1 - 1 note D. Pt in room and in bed at this time. Pt did not attend evening wrap-up group. Pt did receive bedtime medication without incident. Pt does not verbalize any complaints of pain at this time. Pt denies any SI or A/V hallucinations. Pt was observed having various spasms in lower extremities this evening but did not verbalize any distress associated with them after-the-fact.  A. Support and encouragement provided. R. Safety maintained at this time.

## 2012-12-28 NOTE — Progress Notes (Addendum)
Patient ID: [name], female   DOB: 1970-12-16, 42 y.o.   MRN: [id number]   [facility] MD Progress Note  12/28/2012 12:46 PM [name]  MRN:  [id number] Subjective:  Patient states "I'm drinking more fluids and drinking Boost. I don't feel as scared as when I got here. I still hear the voices of Gods and Goddesses but only when I pray. My leg spasms aren't as bad.   Objective:  Patient resting in bed upon assessment. Staff report patient still at risk for falls and also needs encouragement to eat and drink appropriately.   DSM5: Schizophrenia Disorders:   DSM5:  AXIS I: Chronic Paranoid Schizophrenia               Post traumatic stress disorder AXIS II: Deferred  AXIS III:  Past Medical History   Diagnosis  Date   .  Pulmonary emboli    .  Cancer    .  Uterus cancer     AXIS IV: other psychosocial or environmental problems and problems related to social environment  AXIS V: 51-60 Moderate Symptoms ADL's:  Intact  Sleep: Poor  Appetite:  Fair  Suicidal Ideation:  denies Homicidal Ideation:  denies AEB (as evidenced by):  Psychiatric Specialty Exam: Review of Systems  Constitutional: Negative for malaise/fatigue.  HENT: Negative.   Eyes: Negative.   Respiratory: Negative.   Cardiovascular: Negative.   Gastrointestinal: Negative.   Genitourinary: Negative.   Musculoskeletal: Positive for myalgias and joint pain.  Skin: Negative.   Neurological: Negative.   Endo/Heme/Allergies: Negative.   Psychiatric/Behavioral: Positive for depression and hallucinations. Negative for suicidal ideas, memory loss and substance abuse. The patient is nervous/anxious and has insomnia.     Blood pressure 126/84, pulse 123, temperature 97.9 F (36.6 C), temperature source Oral, resp. rate 20, last menstrual period 11/16/2012, SpO2 95.00%.There is no weight on file to calculate BMI.  General Appearance: Disheveled  Eye Contact::  fair  Speech:  alert  Volume:  Normal  Mood:     dysphoric  Affect:    constricted  Thought Process:  disorganized  Orientation:  Only to person  Thought Content:  Delusional, auditory hallucinations  Suicidal Thoughts:  denies  Homicidal Thoughts:  denies  Memory:  fair  Judgement: poor   Insight:  lacking  Psychomotor Activity:  normal  Concentration:  poor  Recall:  poor  Akathisia:  none  Handed:  right  AIMS (if indicated):     Assets:    Sleep:  Number of Hours: 2.75   Current Medications: Current Facility-Administered Medications  Medication Dose Route Frequency Provider Last Rate Last Dose  . alum & mag hydroxide-simeth (MAALOX/MYLANTA) 200-200-20 MG/5ML suspension 30 mL  30 mL Oral Q4H PRN Fransisca Kaufmann, NP      . LORazepam (ATIVAN) tablet 1 mg  1 mg Oral Q8H PRN Shuvon Rankin, NP   1 mg at 12/20/12 0425  . lurasidone (LATUDA) tablet 40 mg  40 mg Oral Q breakfast Mojeed Akintayo   40 mg at 12/20/12 0842  . magnesium hydroxide (MILK OF MAGNESIA) suspension 30 mL  30 mL Oral Daily PRN Fransisca Kaufmann, NP      . nicotine (NICODERM CQ - dosed in mg/24 hours) patch 21 mg  21 mg Transdermal Daily Shuvon Rankin, NP   21 mg at 12/20/12 0840  . traZODone (DESYREL) tablet 50 mg  50 mg Oral QHS PRN,MR X 1 Fransisca Kaufmann, NP      . warfarin (COUMADIN) tablet  7.5 mg  7.5 mg Oral ONCE-1800 Mojeed Akintayo      . Warfarin - Pharmacist Dosing Inpatient   Does not apply q1800 Mojeed Akintayo        Lab Results:  Results for orders placed during the hospital encounter of 12/22/12 (from the past 48 hour(s))  PROTIME-INR     Status: Abnormal   Collection Time    12/27/12  6:58 AM      Result Value Range   Prothrombin Time 16.8 (*) 11.6 - 15.2 seconds   INR 1.40  0.00 - 1.49   Comment: Performed at [facility]  POTASSIUM     Status: None   Collection Time    12/27/12  6:58 AM      Result Value Range   Potassium 4.2  3.5 - 5.1 mEq/L   Comment: Performed at [facility]  MAGNESIUM     Status: None    Collection Time    12/27/12  6:58 AM      Result Value Range   Magnesium 1.9  1.5 - 2.5 mg/dL   Comment: Performed at [facility]  PROTIME-INR     Status: Abnormal   Collection Time    12/28/12  6:48 AM      Result Value Range   Prothrombin Time 19.0 (*) 11.6 - 15.2 seconds   INR 1.64 (*) 0.00 - 1.49   Comment: Performed at [facility]    Physical Findings: AIMS: Facial and Oral Movements Muscles of Facial Expression: None, normal Lips and Perioral Area: None, normal Jaw: None, normal Tongue: None, normal, ,  ,  ,    CIWA:    COWS:     Treatment Plan Summary: Daily contact with patient to assess and evaluate symptoms and progress in treatment Medication management  Plan: 1. Continue crisis management and stabilization. 2. Medication management to reduce current symptoms to base line and improve patient's overall level of functioning 3. Treat health problems as indicated. Patient has order for strict I & O's4. Develop treatment plan to decrease risk of relapse upon discharge and the need for readmission. 4. Psycho-social education regarding relapse prevention and self care. 5. Health care follow up as needed for medical problems. 6. Continue home medications where appropriate. 7. Continue Prolixin  5mg  po BID for psychosis/and delusion 8. Continue Tripletal 300mg  po BID for mood 19. Continue Trazodone to 100 mg po Qhs at bedtime. 10. Pharmacy is managing patient's coumadin daily.  11  Continue Valium 5mg  po BIDforanxiety/insomnia/muscle spasms. 12 Continue patient 1:1 observation for fall risk 13. Patient is encouraged to walk behind the wheel chair for balance and stability. Will encourage her to use a walker rather than a wheelchair at this point to decrease her deconditioning. 14. Must attend all groups and must avoid daytime sleeping.  Medical Decision Making Problem Points:  Established problem, improving slowly (2) Data Points:   Review or order medicine tests (1)  I certify that inpatient services furnished can reasonably be expected to improve the patient's condition.  Fransisca Kaufmann NP-C 12:46 PM 12/28/2012  Discussed with provider. Reviewed note. Agree with above findings, assessment and plan.   Jacqulyn Cane, M.D.  12/28/2012 11:48 PM

## 2012-12-29 LAB — PROTIME-INR: Prothrombin Time: 19.8 seconds — ABNORMAL HIGH (ref 11.6–15.2)

## 2012-12-29 MED ORDER — OXCARBAZEPINE 150 MG PO TABS
450.0000 mg | ORAL_TABLET | Freq: Two times a day (BID) | ORAL | Status: DC
  Administered 2012-12-29 – 2012-12-31 (×4): 450 mg via ORAL
  Filled 2012-12-29 (×6): qty 3

## 2012-12-29 MED ORDER — ENSURE COMPLETE PO LIQD
237.0000 mL | Freq: Three times a day (TID) | ORAL | Status: DC
  Administered 2012-12-29 – 2013-01-02 (×12): 237 mL via ORAL

## 2012-12-29 MED ORDER — BENZTROPINE MESYLATE 0.5 MG PO TABS
0.5000 mg | ORAL_TABLET | Freq: Two times a day (BID) | ORAL | Status: DC
  Administered 2012-12-29 – 2013-01-02 (×8): 0.5 mg via ORAL
  Filled 2012-12-29 (×10): qty 1

## 2012-12-29 MED ORDER — WARFARIN SODIUM 10 MG PO TABS
10.0000 mg | ORAL_TABLET | Freq: Once | ORAL | Status: AC
  Administered 2012-12-29: 10 mg via ORAL
  Filled 2012-12-29: qty 1

## 2012-12-29 NOTE — Progress Notes (Signed)
Observation note: Pt in bed resting with sitter at bedside. Pt denies SI/HI/AH. Pt endorse visual hallucinations, seeing shadows. Pt stated that she continues to have off and on leg spasms. Writer encouraged pt to use wheelchair as needed. Pt agreed. No complaints verbalized by pt this morning. Safety maintained.

## 2012-12-29 NOTE — Progress Notes (Signed)
Pt resting in bed with eyes closed.  No distress observed.  Respirations even/unlabored.  Sitter at bedside.  Continue 1:1 for safety.  Pt safe at this time. 

## 2012-12-29 NOTE — Progress Notes (Signed)
Patient ID: [name], female   DOB: 05/11/1970, 43 y.o.   MRN: [id number]    [facility] MD Progress Note  12/29/2012 10:30AM [name]  MRN:  [id number]  Subjective:  "I am  still feeling anxious and having muscle spasms."  Objective: Patient reports decreased anxiety, delusional and psychosis. She also  reports decreased muscle spasm and her thought process is more organized but she is still grandiose saying that she has power to "heal" people. Patient has been eating and drinking well, she is compliant with her medication and did not endorse any adverse reactions DSM5: Schizophrenia Disorders:   DSM5:  AXIS I: Chronic Paranoid Schizophrenia               Post traumatic stress disorder AXIS II: Deferred  AXIS III:  Past Medical History   Diagnosis  Date   .  Pulmonary emboli    .  Cancer    .  Uterus cancer     AXIS IV: other psychosocial or environmental problems and problems related to social environment  AXIS V: 41-50 Serious Symptoms  ADL's:  Intact  Sleep: Poor  Appetite:  Fair  Suicidal Ideation:  denies Homicidal Ideation:  denies AEB (as evidenced by):  Psychiatric Specialty Exam: Review of Systems  Constitutional: Negative for malaise/fatigue.  HENT: Negative.   Eyes: Negative.   Respiratory: Negative.   Cardiovascular: Negative.   Gastrointestinal: Negative.   Genitourinary: Negative.   Musculoskeletal: Positive for myalgias and joint pain.  Skin: Negative.   Neurological: Negative.   Endo/Heme/Allergies: Negative.   Psychiatric/Behavioral: Positive for depression and hallucinations. Negative for suicidal ideas, memory loss and substance abuse. The patient is nervous/anxious and has insomnia.     Blood pressure 119/81, pulse 89, temperature 98.3 F (36.8 C), temperature source Oral, resp. rate 20, last menstrual period 11/16/2012, SpO2 95.00%.There is no weight on file to calculate BMI.  General Appearance: Disheveled  Eye Contact::  fair  Speech:   alert  Volume:  Normal  Mood:    dysphoric  Affect:    constricted  Thought Process:  disorganized  Orientation:  Only to person  Thought Content:  delusional  Suicidal Thoughts:  denies  Homicidal Thoughts:  denies  Memory:  fair  Judgement: poor   Insight:  lacking  Psychomotor Activity:  normal  Concentration:  poor  Recall:  poor  Akathisia:  none  Handed:  right  AIMS (if indicated):     Assets:    Sleep:  Number of Hours: 6.5   Current Medications: Current Facility-Administered Medications  Medication Dose Route Frequency Provider Last Rate Last Dose  . alum & mag hydroxide-simeth (MAALOX/MYLANTA) 200-200-20 MG/5ML suspension 30 mL  30 mL Oral Q4H PRN Fransisca Kaufmann, NP      . LORazepam (ATIVAN) tablet 1 mg  1 mg Oral Q8H PRN Shuvon Rankin, NP   1 mg at 12/20/12 0425  . lurasidone (LATUDA) tablet 40 mg  40 mg Oral Q breakfast Kentrel Clevenger   40 mg at 12/20/12 0842  . magnesium hydroxide (MILK OF MAGNESIA) suspension 30 mL  30 mL Oral Daily PRN Fransisca Kaufmann, NP      . nicotine (NICODERM CQ - dosed in mg/24 hours) patch 21 mg  21 mg Transdermal Daily Shuvon Rankin, NP   21 mg at 12/20/12 0840  . traZODone (DESYREL) tablet 50 mg  50 mg Oral QHS PRN,MR X 1 Fransisca Kaufmann, NP      . warfarin (COUMADIN) tablet 7.5 mg  7.5 mg Oral ONCE-1800 Akim Watkinson      . Warfarin - Pharmacist Dosing Inpatient   Does not apply q1800 Lorenza Shakir        Lab Results:  Results for orders placed during the hospital encounter of 12/22/12 (from the past 48 hour(s))  URINALYSIS, ROUTINE W REFLEX MICROSCOPIC     Status: None   Collection Time    12/24/12  5:59 PM      Result Value Range   Color, Urine YELLOW  YELLOW   APPearance CLEAR  CLEAR   Specific Gravity, Urine 1.013  1.005 - 1.030   pH 7.0  5.0 - 8.0   Glucose, UA NEGATIVE  NEGATIVE mg/dL   Hgb urine dipstick NEGATIVE  NEGATIVE   Bilirubin Urine NEGATIVE  NEGATIVE   Ketones, ur NEGATIVE  NEGATIVE mg/dL   Protein, ur NEGATIVE   NEGATIVE mg/dL   Urobilinogen, UA 0.2  0.0 - 1.0 mg/dL   Nitrite NEGATIVE  NEGATIVE   Leukocytes, UA NEGATIVE  NEGATIVE   Comment: MICROSCOPIC NOT DONE ON URINES WITH NEGATIVE PROTEIN, BLOOD, LEUKOCYTES, NITRITE, OR GLUCOSE <1000 mg/dL.     Performed at [facility]  BASIC METABOLIC PANEL     Status: Abnormal   Collection Time    12/25/12  6:30 AM      Result Value Range   Sodium 137  135 - 145 mEq/L   Potassium 3.7  3.5 - 5.1 mEq/L   Chloride 100  96 - 112 mEq/L   CO2 24  19 - 32 mEq/L   Glucose, Bld 99  70 - 99 mg/dL   BUN 14  6 - 23 mg/dL   Creatinine, Ser 1.61  0.50 - 1.10 mg/dL   Calcium 9.3  8.4 - 09.6 mg/dL   GFR calc non Af Amer 82 (*) >90 mL/min   GFR calc Af Amer >90  >90 mL/min   Comment: (NOTE)     The eGFR has been calculated using the CKD EPI equation.     This calculation has not been validated in all clinical situations.     eGFR's persistently <90 mL/min signify possible Chronic Kidney     Disease.     Performed at [facility]  TSH     Status: None   Collection Time    12/25/12  6:30 AM      Result Value Range   TSH 1.096  0.350 - 4.500 uIU/mL   Comment: Performed at [facility]  PROTIME-INR     Status: Abnormal   Collection Time    12/25/12  6:30 AM      Result Value Range   Prothrombin Time 18.3 (*) 11.6 - 15.2 seconds   INR 1.57 (*) 0.00 - 1.49   Comment: Performed at [facility]  PROTIME-INR     Status: Abnormal   Collection Time    12/26/12  6:25 AM      Result Value Range   Prothrombin Time 17.3 (*) 11.6 - 15.2 seconds   INR 1.45  0.00 - 1.49   Comment: Performed at [facility]    Physical Findings: AIMS: Facial and Oral Movements Muscles of Facial Expression: None, normal Lips and Perioral Area: None, normal Jaw: None, normal Tongue: None, normal, ,  ,  ,    CIWA:    COWS:     Treatment Plan Summary: Daily contact with patient to assess and evaluate  symptoms and progress in treatment  Medication management  Plan: 1. Continue crisis management and stabilization. 2. Medication management to reduce current symptoms to base line and improve patient's overall level of functioning 3. Treat health problems as indicated. Patient has order for strict I & O's4. Develop treatment plan to decrease risk of relapse upon discharge and the need for readmission. 4. Psycho-social education regarding relapse prevention and self care. 5. Health care follow up as needed for medical problems. 6. Continue home medications where appropriate. 7. Continue Prolixin  5mg  po BID for psychosis/and delusion 8. Increase Tripletal to 450mg  po BID for mood 19. Continue Trazodone to 100 mg po Qhs at bedtime. 10. Pharmacy is managing patient's coumadin daily.  11  Continue Valium 5mg  po BID for anxiety/insomnia/muscle spasms. 12 Continue patient 1:1 observation for fall risk Medical Decision Making Problem Points:  Established problem, improving slowly (2) Data Points:  Review or order medicine tests (1)  I certify that inpatient services furnished can reasonably be expected to improve the patient's condition.  Thedore Mins, MD 12/29/2012 10:30 AM

## 2012-12-29 NOTE — [facility] Group Notes (Signed)
[facility] LCSW Aftercare Discharge Planning Group Note   12/29/2012 10:36 AM  Participation Quality:  Did not attend.  "I'm still having tremors and I might fall.  I would come otherwise."    [place], Thereasa Distance B

## 2012-12-29 NOTE — Progress Notes (Signed)
ANTICOAGULATION CONSULT NOTE - Follow Up Consult  Pharmacy Consult for Coumadin Indication: H/O PE   Allergies  Allergen Reactions  . Darvocet [Propoxyphene-Acetaminophen] Nausea And Vomiting and Other (See Comments)    Shaking      Vital Signs: Temp: 97.5 F (36.4 C) (09/29 0714) Temp src: Oral (09/29 0714) BP: 124/84 mmHg (09/29 0714) Pulse Rate: 108 (09/29 0714)  Labs:  Recent Labs  12/27/12 0658 12/28/12 0648 12/29/12 0620  LABPROT 16.8* 19.0* 19.8*  INR 1.40 1.64* 1.74*    The CrCl is unknown because both a height and weight (above a minimum accepted value) are required for this calculation.   Medications:  Scheduled:  . benztropine  0.5 mg Oral BID  . diazepam  5 mg Oral BID PC  . feeding supplement  237 mL Oral TID WC  . fluPHENAZine  5 mg Oral BID  . naproxen  250 mg Oral BID WC  . nicotine  21 mg Transdermal Daily  . OXcarbazepine  450 mg Oral BID  . polycarbophil  1,250 mg Oral Daily  . traZODone  100 mg Oral Q2000  . warfarin  10 mg Oral ONCE-1800  . Warfarin - Pharmacist Dosing Inpatient   Does not apply q1800    Assessment: INR below goal increasing very slowly  Goal of Therapy:  INR 2-3    Plan:  Coumadin 10 mg x 1 today  PT/INR in am    [name] 12/29/2012,2:46 PM

## 2012-12-29 NOTE — Progress Notes (Signed)
Observation note: Pt sitting in dayroom in her wheelchair, watching t.v. Sitter present with pt in dayroom. No complaints verbalized by pt at this time. Safety maintained. Pt remains on 1:1 observation for safety.

## 2012-12-29 NOTE — Progress Notes (Signed)
Observation note: Pt in her room pacing back and forth. Sitter present with pt. Pt gait steady. No complaints verbalized by pt. Pt denies having any spasm at this time. Pt is making progress this evening. Pt remains on 1:1 observation for safety. Safety maintained.

## 2012-12-29 NOTE — [facility] Group Notes (Signed)
[facility] LCSW Group Therapy  12/29/2012 1:15 pm  Type of Therapy: Process Group Therapy  Participation Level:  Active  Participation Quality:  Appropriate  Affect:  Flat  Cognitive:  Oriented  Insight:  Improving  Engagement in Group:  Limited  Engagement in Therapy:  Limited  Modes of Intervention:  Activity, Clarification, Education, Problem-solving and Support  Summary of Progress/Problems: Today's group addressed the issue of overcoming obstacles.  Patients were asked to identify their biggest obstacle post d/c that stands in the way of their on-going success, and then problem solve as to how to manage this.  [name] needed to be cadjoled into coming to group-but eventually made it.  She stayed for the whole group.  She declined to name any obstacles, but did stated she has the support of "gods and godesses."  When I asked her for a support that has a human body, she emphatically stated that they fit that criteria.  Asked about her "old man" who she also confirmed is a support.  [name] 12/29/2012   2:06 PM

## 2012-12-29 NOTE — Progress Notes (Signed)
Pt is sitting on her bed.  She had just gone to the bathroom.  She is ambulating with out using the wheelchair.  Sitter is by patient's side.  Pt reports she is feeling better.  She denies SI/HI.  She still hears occasional voices, but denies at this time.  Pt has no c/o pain or discomfort.  Pt continues on 1:1 for safety.  Support and encouragement offered.  I&Os monitored.  Sitter with pt.  Pt safe at this time.

## 2012-12-29 NOTE — Progress Notes (Signed)
1:1 note- Pt resting in bed with eyes closed.  No distress observed.  Respirations even/inlabored.  Sitter at bedside for safety.  Pt remains safe.

## 2012-12-30 LAB — PROTIME-INR
INR: 1.85 — ABNORMAL HIGH (ref 0.00–1.49)
Prothrombin Time: 20.8 seconds — ABNORMAL HIGH (ref 11.6–15.2)

## 2012-12-30 MED ORDER — WARFARIN SODIUM 10 MG PO TABS
10.0000 mg | ORAL_TABLET | Freq: Once | ORAL | Status: AC
  Administered 2012-12-30: 10 mg via ORAL
  Filled 2012-12-30: qty 1

## 2012-12-30 NOTE — Progress Notes (Signed)
1:1 progress note- Pt lying in bed with eyes closed.  No distress observed at this time.  Respirations even/unlabored.  Sitter at bedside.  Continue 1:1 for safety.  Pt safe at this time.

## 2012-12-30 NOTE — Progress Notes (Signed)
Pt resting in bed with eyes closed.  No distress observed.  Respirations even/unlabored.  Sitter at bedside.  Continue 1:1 for safety.  Pt remains safe. 

## 2012-12-30 NOTE — Progress Notes (Signed)
Observation note: Pt in her room sitting in a chair. Sitter present with pt. Sitter reported that pt had leg and arm spasm this morning witch caused the pt to become unsteady. Sitter was able to have pt sit down in her chair, where she remains safe. Pt denies SI/HI/AVH this morning. Pt c/o of spasms this morning and difficulty walking. Writer encouraged  pt to use her wheelchair this morning. Safety maintained. 1:1 observation maintained for safety.

## 2012-12-30 NOTE — Progress Notes (Signed)
Observation note: Pt in bed awake, resting, sitter at bedside. Pt c/o leg and arm spasms. Pt has poor appetite and did not eat her lunch tray. Pt remains on 1:1 observation for safety. Safety maintained.

## 2012-12-30 NOTE — Progress Notes (Signed)
Patient ID: [name], female   DOB: 19-Sep-1970, 42 y.o.   MRN: [id number] Patient ID: [name], female   DOB: Jan 10, 1971, 42 y.o.   MRN: [id number]    [facility] MD Progress Note  12/29/2012 10:30AM [name]  MRN:  [id number]  Subjective:  [name] reports, "I'm still having the muscle spasms all day and all night. I slept well last night after I took my medicines. I slept for about 7 hours. I drink the boost and shakes everyday. I will describe my mood as warrior today. I can hear God speak, usually of good things. I have been hearing God speak for over a year now. I don't see God, but I do see flashes of light sometimes. Can you tell me when I can get out of this place".  Objective: [name] is currently on 1:1 supervision for safety. [name] is out of bed and sitting up in a wheel chair. She is able to make eye contact. However, presents with delusional thoughts.  DSM5: Schizophrenia Disorders:   DSM5:  AXIS I: Chronic Paranoid Schizophrenia               Post traumatic stress disorder AXIS II: Deferred  AXIS III:  Past Medical History   Diagnosis  Date   .  Pulmonary emboli    .  Cancer    .  Uterus cancer     AXIS IV: other psychosocial or environmental problems and problems related to social environment  AXIS V: 41-50 Serious Symptoms  ADL's:  Intact  Sleep: Poor  Appetite:  Fair  Suicidal Ideation:  denies Homicidal Ideation:  denies AEB (as evidenced by):  Psychiatric Specialty Exam: Review of Systems  Constitutional: Negative for malaise/fatigue.  HENT: Negative.   Eyes: Negative.   Respiratory: Negative.   Cardiovascular: Negative.   Gastrointestinal: Negative.   Genitourinary: Negative.   Musculoskeletal: Positive for myalgias and joint pain.  Skin: Negative.   Neurological: Negative.   Endo/Heme/Allergies: Negative.   Psychiatric/Behavioral: Positive for depression and hallucinations. Negative for suicidal ideas, memory loss and substance abuse. The patient is  nervous/anxious and has insomnia.     Blood pressure 119/81, pulse 89, temperature 98.3 F (36.8 C), temperature source Oral, resp. rate 20, last menstrual period 11/16/2012, SpO2 95.00%.There is no weight on file to calculate BMI.  General Appearance: Casual, sitting up in a wheel chair.  Eye Contact::  Fair  Speech:  Alert  Volume:  Normal  Mood:    dysphoric  Affect:    constricted  Thought Process:  disorganized  Orientation:  Only to person  Thought Content:  Delusional, hallucinations  Suicidal Thoughts:  denies  Homicidal Thoughts:  denies  Memory:  Fair  Judgement: Poor   Insight:  Lacking  Psychomotor Activity:  normal  Concentration:  Poor  Recall:  Poor  Akathisia:  none  Handed:  right  AIMS (if indicated):     Assets:    Sleep:  Number of Hours: 6.5   Current Medications: Current Facility-Administered Medications  Medication Dose Route Frequency Provider Last Rate Last Dose  . alum & mag hydroxide-simeth (MAALOX/MYLANTA) 200-200-20 MG/5ML suspension 30 mL  30 mL Oral Q4H PRN Fransisca Kaufmann, NP      . LORazepam (ATIVAN) tablet 1 mg  1 mg Oral Q8H PRN Shuvon Rankin, NP   1 mg at 12/20/12 0425  . lurasidone (LATUDA) tablet 40 mg  40 mg Oral Q breakfast [name]   40 mg at 12/20/12 0842  .  magnesium hydroxide (MILK OF MAGNESIA) suspension 30 mL  30 mL Oral Daily PRN Fransisca Kaufmann, NP      . nicotine (NICODERM CQ - dosed in mg/24 hours) patch 21 mg  21 mg Transdermal Daily Shuvon Rankin, NP   21 mg at 12/20/12 0840  . traZODone (DESYREL) tablet 50 mg  50 mg Oral QHS PRN,MR X 1 Fransisca Kaufmann, NP      . warfarin (COUMADIN) tablet 7.5 mg  7.5 mg Oral ONCE-1800 [name]      . Warfarin - Pharmacist Dosing Inpatient   Does not apply q1800 [name]        Lab Results:  Results for orders placed during the hospital encounter of 12/22/12 (from the past 48 hour(s))  URINALYSIS, ROUTINE W REFLEX MICROSCOPIC     Status: None   Collection Time    12/24/12  5:59  PM      Result Value Range   Color, Urine YELLOW  YELLOW   APPearance CLEAR  CLEAR   Specific Gravity, Urine 1.013  1.005 - 1.030   pH 7.0  5.0 - 8.0   Glucose, UA NEGATIVE  NEGATIVE mg/dL   Hgb urine dipstick NEGATIVE  NEGATIVE   Bilirubin Urine NEGATIVE  NEGATIVE   Ketones, ur NEGATIVE  NEGATIVE mg/dL   Protein, ur NEGATIVE  NEGATIVE mg/dL   Urobilinogen, UA 0.2  0.0 - 1.0 mg/dL   Nitrite NEGATIVE  NEGATIVE   Leukocytes, UA NEGATIVE  NEGATIVE   Comment: MICROSCOPIC NOT DONE ON URINES WITH NEGATIVE PROTEIN, BLOOD, LEUKOCYTES, NITRITE, OR GLUCOSE <1000 mg/dL.     Performed at [facility]  BASIC METABOLIC PANEL     Status: Abnormal   Collection Time    12/25/12  6:30 AM      Result Value Range   Sodium 137  135 - 145 mEq/L   Potassium 3.7  3.5 - 5.1 mEq/L   Chloride 100  96 - 112 mEq/L   CO2 24  19 - 32 mEq/L   Glucose, Bld 99  70 - 99 mg/dL   BUN 14  6 - 23 mg/dL   Creatinine, Ser 1.47  0.50 - 1.10 mg/dL   Calcium 9.3  8.4 - 82.9 mg/dL   GFR calc non Af Amer 82 (*) >90 mL/min   GFR calc Af Amer >90  >90 mL/min   Comment: (NOTE)     The eGFR has been calculated using the CKD EPI equation.     This calculation has not been validated in all clinical situations.     eGFR's persistently <90 mL/min signify possible Chronic Kidney     Disease.     Performed at [facility]  TSH     Status: None   Collection Time    12/25/12  6:30 AM      Result Value Range   TSH 1.096  0.350 - 4.500 uIU/mL   Comment: Performed at [facility]  PROTIME-INR     Status: Abnormal   Collection Time    12/25/12  6:30 AM      Result Value Range   Prothrombin Time 18.3 (*) 11.6 - 15.2 seconds   INR 1.57 (*) 0.00 - 1.49   Comment: Performed at [facility]  PROTIME-INR     Status: Abnormal   Collection Time    12/26/12  6:25 AM      Result Value Range   Prothrombin Time 17.3 (*) 11.6 - 15.2 seconds  INR 1.45  0.00 - 1.49   Comment:  Performed at [facility]    Physical Findings: AIMS: Facial and Oral Movements Muscles of Facial Expression: None, normal Lips and Perioral Area: None, normal Jaw: None, normal Tongue: None, normal, ,  ,  ,    CIWA:    COWS:     Treatment Plan Summary: Daily contact with patient to assess and evaluate symptoms and progress in treatment Medication management  Plan: Supportive approach/coping skills/stabilization. Encouraged out of room, participation in group sessions and application of coping skills when distressed. Will continue to monitor response to/adverse effects of medications in use to assure effectiveness. Continue to monitor mood, behavior and interaction with staff and other patients. Continue current plan of care. Continue patient 1:1 observation for fall risk  Medical Decision Making Problem Points:  Established problem, improving slowly (2) Data Points:  Review or order medicine tests (1)  I certify that inpatient services furnished can reasonably be expected to improve the patient's condition.  Sanjuana Kava, PMHNP, FNP-BC

## 2012-12-30 NOTE — Progress Notes (Signed)
Pt resting in bed with eyes closed.  No distress observed.  Continue 1:1 for safety.  Sitter at bedside.  Pt safe at this time.

## 2012-12-30 NOTE — Progress Notes (Signed)
Observation note: pt sitting up in bed with sitter at bedside. Pt compliant with taking evening meds. No complaints verbalized by pt. Safety maintained. Pt remains on 1:1 observation for safety.

## 2012-12-30 NOTE — [facility] Group Notes (Signed)
[facility] LCSW Group Therapy  12/30/2012 , 1:05 PM   Type of Therapy:  Group Therapy  Participation Level: Did not attend    Summary of Progress/Problems: Today's group focused on the term Diagnosis.  Participants were asked to define the term, and then pronounce whether it is a negative, positive or neutral term.  Daryel Gerald B 12/30/2012 , 1:05 PM

## 2012-12-30 NOTE — Progress Notes (Signed)
ANTICOAGULATION CONSULT NOTE - Follow Up Consult  Pharmacy Consult for Coumadin  Indication: h/o pe  Allergies  Allergen Reactions  . Darvocet [Propoxyphene-Acetaminophen] Nausea And Vomiting and Other (See Comments)    Shaking      Vital Signs: Temp: 97.4 F (36.3 C) (09/30 0700) BP: 108/83 mmHg (09/30 0701) Pulse Rate: 108 (09/30 0701)  Labs:  Recent Labs  12/28/12 0648 12/29/12 0620 12/30/12 0640  LABPROT 19.0* 19.8* 20.8*  INR 1.64* 1.74* 1.85*    The CrCl is unknown because both a height and weight (above a minimum accepted value) are required for this calculation.   Medications:  Scheduled:  . benztropine  0.5 mg Oral BID  . diazepam  5 mg Oral BID PC  . feeding supplement  237 mL Oral TID WC  . fluPHENAZine  5 mg Oral BID  . naproxen  250 mg Oral BID WC  . nicotine  21 mg Transdermal Daily  . OXcarbazepine  450 mg Oral BID  . polycarbophil  1,250 mg Oral Daily  . traZODone  100 mg Oral Q2000  . Warfarin - Pharmacist Dosing Inpatient   Does not apply q1800    Assessment: INR slowly increasing    Goal of Therapy:  INR 2-3    Plan:  Coumadin 10 mg po x 1  PT/INR  [name] 12/30/2012,8:54 AM

## 2012-12-31 DIAGNOSIS — F259 Schizoaffective disorder, unspecified: Principal | ICD-10-CM

## 2012-12-31 DIAGNOSIS — F4310 Post-traumatic stress disorder, unspecified: Secondary | ICD-10-CM

## 2012-12-31 MED ORDER — WARFARIN SODIUM 7.5 MG PO TABS
7.5000 mg | ORAL_TABLET | Freq: Once | ORAL | Status: AC
  Administered 2012-12-31: 7.5 mg via ORAL
  Filled 2012-12-31: qty 1

## 2012-12-31 MED ORDER — OXCARBAZEPINE 300 MG PO TABS
300.0000 mg | ORAL_TABLET | Freq: Two times a day (BID) | ORAL | Status: DC
  Administered 2012-12-31 – 2013-01-02 (×4): 300 mg via ORAL
  Filled 2012-12-31 (×6): qty 1

## 2012-12-31 NOTE — Progress Notes (Signed)
1:1 progress note- Pt resting in bed with eyes closed.  No distress observed.  Respirations even/unlabored.  Sitter at bedside for safety.  Pt safe at this time.

## 2012-12-31 NOTE — Tx Team (Signed)
  Interdisciplinary Treatment Plan Update   Date Reviewed:  12/31/2012  Time Reviewed:  11:05 AM  Progress in Treatment:   Attending groups: No Participating in groups: No Taking medication as prescribed: Yes  Tolerating medication: Yes Family/Significant other contact made: No  Patient understands diagnosis: Yes  Discussing patient identified problems/goals with staff: Yes Medical problems stabilized or resolved: Yes Denies suicidal/homicidal ideation: Yes Patient has not harmed self or others: Yes  For review of initial/current patient goals, please see plan of care.  Estimated Length of Stay:  2-3 days  Reason for Continuation of Hospitalization: Delusions  Hallucinations Medication stabilization  New Problems/Goals identified: N/A  Discharge Plan or Barriers:  Pt will follow up at [facility] for medication management and therapy.   Additional Comments:  Delusions are decreasing, possibly d/c Friday  Attendees:  Signature: Thedore Mins, MD 12/31/2012 11:05 AM   Signature: Ashley Jacobs, LCSW 12/31/2012 11:05 AM  Signature: Simona Huh, SW intern 12/31/2012 11:05 AM  Signature: Joslyn Devon, RN 12/31/2012 11:05 AM  Signature: Liborio Nixon, RN 12/31/2012 11:05 AM  Signature:  12/31/2012 11:05 AM  Signature:   12/31/2012 11:05 AM  Signature:    Signature:    Signature:    Signature:    Signature:    Signature:      Scribe for Treatment Team:   Richelle Ito, LCSW  12/31/2012 11:05 AM

## 2012-12-31 NOTE — Progress Notes (Signed)
Psychoeducational Group Note  Date:  12/31/2012 Time: 1100  Group Topic/Focus:  Personal Choices and Values:   The focus of this group is to help patients assess and explore the importance of values in their lives, how their values affect their decisions, how they express their values and what opposes their expression.  Participation Level: Did Not Attend  Participation Quality:  Not Applicable  Affect:  Not Applicable  Cognitive:  Not Applicable  Insight:  Not Applicable  Engagement in Group: Not Applicable  Additional Comments:  Patient did not attend group, remained in bed.  Karleen Hampshire Brittini 12/31/2012, 3:05 PM

## 2012-12-31 NOTE — Progress Notes (Signed)
1:1 progress note- Pt in bed at this time with eyes closed, but responds when her name is called.  Pt was up earlier using a wheelchair for ambulation.  She states she is having spasms today.  Pt denies SI/HI.  She still makes gestures as if she is having visual hallucinations.  She denies voices at this time.  Pt given her hs Trazodone at this time.  Pt voices no needs or concerns.  Support and encouragement offered.  Continue 1:1 for safety d/t high fall risk.  Sitter at bedside.  Pt safe at this time.

## 2012-12-31 NOTE — Progress Notes (Signed)
Recreation Therapy Notes  Date: 10..01.2014 Time: 9:30am Location: [address]  Group Topic: Communication  Goal Area(s) Addresses:  Patient will effectively communicate verbally with group members.  Patient will identify benefit of healthy communication.   Behavioral Response: Did not attend.   Marykay Lex Shawndrea Rutkowski, LRT/CTRS  Jearl Klinefelter 12/31/2012 12:34 PM

## 2012-12-31 NOTE — Progress Notes (Signed)
Observation note: Pt at med window with sitter present. Pt continues to use wheelchair. Pt continues to have leg spasms and do not feel safe walking. Pt compliant with treatment. Pt remains on 1:1 observation for safety.

## 2012-12-31 NOTE — BHH Group Notes (Signed)
[facility] LCSW Aftercare Discharge Planning Group Note   12/31/2012 10:03 AM  Participation Quality:  DID NOT ATTEND. Asleep in Bed.  (discussed patient in treatment team meeting, and patient is stable today and not as delusional per report of MD and RNs. Patient will follow up at [facility] where she is a current patient and has a supportive boyfriend) Patient will DC most likely on Friday.   Nail, Catalina Gravel

## 2012-12-31 NOTE — Progress Notes (Signed)
Observation note: Pt in bed sleeping. Sitter present at bedside. Pt do not appear to be in distress. Resp are even/unlabored. Pt remains on 1:1 observation for safety. Safety maintained.   Pt compliant with taking meds this morning. Pt verbalized having leg spasms this morning.

## 2012-12-31 NOTE — Progress Notes (Addendum)
Patient ID: [name], female   DOB: Apr 12, 1970, 41 y.o.   MRN: [id number]    [facility] MD Progress Note  12/31/2012 10:30AM [name]  MRN:  [id number]  Subjective:  "I am feeling sedated, anxious and still having  muscle spasms."  Objective: Patient reports decreased depression, muscle spasm and anxiety. She continues to have a fixed delusion saying she has a special power to "heal" people. However, her thought process is more organized. She has been eating and drinking well. She is encouraged to start ambulating in preparation for her discharge. She is compliant with her medication and did not endorse any adverse reactions DSM5: Schizophrenia Disorders:   DSM5:  AXIS I: Schizoaffective disorder              Post traumatic stress disorder AXIS II: Deferred  AXIS III:  Past Medical History   Diagnosis  Date   .  Pulmonary emboli    .  Cancer    .  Uterus cancer     AXIS IV: other psychosocial or environmental problems and problems related to social environment  AXIS V: 50-60 moderate Symptoms  ADL's:  Intact  Sleep: Poor  Appetite:  Fair  Suicidal Ideation:  denies Homicidal Ideation:  denies AEB (as evidenced by):  Psychiatric Specialty Exam: Review of Systems  Constitutional: Negative for malaise/fatigue.  HENT: Negative.   Eyes: Negative.   Respiratory: Negative.   Cardiovascular: Negative.   Gastrointestinal: Negative.   Genitourinary: Negative.   Musculoskeletal: Positive for myalgias and joint pain.  Skin: Negative.   Neurological: Negative.   Endo/Heme/Allergies: Negative.   Psychiatric/Behavioral: Positive for delusions . Negative for suicidal ideas, memory loss and substance abuse. The patient is nervous/anxious and has insomnia.     Blood pressure 119/81, pulse 89, temperature 98.3 F (36.8 C), temperature source Oral, resp. rate 20, last menstrual period 11/16/2012, SpO2 95.00%.There is no weight on file to calculate BMI.  General Appearance: fairly  groomed  Patent attorney::  fair  Speech:  alert  Volume:  Normal  Mood:    euthymic  Affect:    constricted  Thought Process:  linear  Orientation:  Only to person and place not to time  Thought Content:  delusional  Suicidal Thoughts:  denies  Homicidal Thoughts:  denies  Memory:  fair  Judgement: marginal  Insight:  marginal  Psychomotor Activity:  normal  Concentration:  fair  Recall:  fair  Akathisia:  none  Handed:  right  AIMS (if indicated):     Assets:    Sleep:  Number of Hours: 6.25   Current Medications: Current Facility-Administered Medications  Medication Dose Route Frequency Provider Last Rate Last Dose  . alum & mag hydroxide-simeth (MAALOX/MYLANTA) 200-200-20 MG/5ML suspension 30 mL  30 mL Oral Q4H PRN Fransisca Kaufmann, NP      . LORazepam (ATIVAN) tablet 1 mg  1 mg Oral Q8H PRN Shuvon Rankin, NP   1 mg at 12/20/12 0425  . lurasidone (LATUDA) tablet 40 mg  40 mg Oral Q breakfast Latronda Spink   40 mg at 12/20/12 0842  . magnesium hydroxide (MILK OF MAGNESIA) suspension 30 mL  30 mL Oral Daily PRN Fransisca Kaufmann, NP      . nicotine (NICODERM CQ - dosed in mg/24 hours) patch 21 mg  21 mg Transdermal Daily Shuvon Rankin, NP   21 mg at 12/20/12 0840  . traZODone (DESYREL) tablet 50 mg  50 mg Oral QHS PRN,MR X 1 Fransisca Kaufmann, NP      .  warfarin (COUMADIN) tablet 7.5 mg  7.5 mg Oral ONCE-1800 Ross Bender      . Warfarin - Pharmacist Dosing Inpatient   Does not apply q1800 Yona Stansbury        Lab Results:  Results for orders placed during the hospital encounter of 12/22/12 (from the past 48 hour(s))  URINALYSIS, ROUTINE W REFLEX MICROSCOPIC     Status: None   Collection Time    12/24/12  5:59 PM      Result Value Range   Color, Urine YELLOW  YELLOW   APPearance CLEAR  CLEAR   Specific Gravity, Urine 1.013  1.005 - 1.030   pH 7.0  5.0 - 8.0   Glucose, UA NEGATIVE  NEGATIVE mg/dL   Hgb urine dipstick NEGATIVE  NEGATIVE   Bilirubin Urine NEGATIVE  NEGATIVE    Ketones, ur NEGATIVE  NEGATIVE mg/dL   Protein, ur NEGATIVE  NEGATIVE mg/dL   Urobilinogen, UA 0.2  0.0 - 1.0 mg/dL   Nitrite NEGATIVE  NEGATIVE   Leukocytes, UA NEGATIVE  NEGATIVE   Comment: MICROSCOPIC NOT DONE ON URINES WITH NEGATIVE PROTEIN, BLOOD, LEUKOCYTES, NITRITE, OR GLUCOSE <1000 mg/dL.     Performed at [facility]  BASIC METABOLIC PANEL     Status: Abnormal   Collection Time    12/25/12  6:30 AM      Result Value Range   Sodium 137  135 - 145 mEq/L   Potassium 3.7  3.5 - 5.1 mEq/L   Chloride 100  96 - 112 mEq/L   CO2 24  19 - 32 mEq/L   Glucose, Bld 99  70 - 99 mg/dL   BUN 14  6 - 23 mg/dL   Creatinine, Ser 1.61  0.50 - 1.10 mg/dL   Calcium 9.3  8.4 - 09.6 mg/dL   GFR calc non Af Amer 82 (*) >90 mL/min   GFR calc Af Amer >90  >90 mL/min   Comment: (NOTE)     The eGFR has been calculated using the CKD EPI equation.     This calculation has not been validated in all clinical situations.     eGFR's persistently <90 mL/min signify possible Chronic Kidney     Disease.     Performed at [facility]  TSH     Status: None   Collection Time    12/25/12  6:30 AM      Result Value Range   TSH 1.096  0.350 - 4.500 uIU/mL   Comment: Performed at [facility]  PROTIME-INR     Status: Abnormal   Collection Time    12/25/12  6:30 AM      Result Value Range   Prothrombin Time 18.3 (*) 11.6 - 15.2 seconds   INR 1.57 (*) 0.00 - 1.49   Comment: Performed at [facility]  PROTIME-INR     Status: Abnormal   Collection Time    12/26/12  6:25 AM      Result Value Range   Prothrombin Time 17.3 (*) 11.6 - 15.2 seconds   INR 1.45  0.00 - 1.49   Comment: Performed at [facility]    Physical Findings: AIMS: Facial and Oral Movements Muscles of Facial Expression: None, normal Lips and Perioral Area: None, normal Jaw: None, normal Tongue: None, normal, ,  ,  ,    CIWA:    COWS:     Treatment Plan  Summary: Daily contact with patient to assess and  evaluate symptoms and progress in treatment Medication management  Plan: 1. Continue crisis management and stabilization. 2. Medication management to reduce current symptoms to base line and improve patient's overall level of functioning 3. Treat health problems as indicated. Patient has order for strict I & O's4. Develop treatment plan to decrease risk of relapse upon discharge and the need for readmission. 4. Psycho-social education regarding relapse prevention and self care. 5. Health care follow up as needed for medical problems. 6. Continue home medications where appropriate. 7. Continue Prolixin  5mg  po BID for psychosis/and delusion 8. Decrease Tripletal to 300mg  po BID for mood 19. Continue Trazodone to 100 mg po Qhs at bedtime. 10. Pharmacy is managing patient's coumadin daily.  11  Continue Valium 5mg  po BID for anxiety/insomnia/muscle spasms. 12 Continue patient 1:1 observation for fall risk Medical Decision Making Problem Points:  Established problem, improving  (2) Data Points:  Review or order medicine tests (1)  I certify that inpatient services furnished can reasonably be expected to improve the patient's condition.  Thedore Mins, MD 12/31/2012 10:30 AM

## 2012-12-31 NOTE — Progress Notes (Signed)
Observation note. Pt in hallway sitting in  wheelchair with sitter present. Pt continues to feel spasms in her leg. Pt is able to ambulate to the restroom with sitter present without difficulty. Pt c/o burning when voiding and urgency. Pt given specimen cup for UA. Pt remains on 1:1 observation for safety.

## 2012-12-31 NOTE — Progress Notes (Signed)
Pt resting in bed with eyes closed.  No distress observed.  Respirations even/unlabored.  Pt woke up around 0200 and was given her scheduled Trazodone from 2200.  Pt had no complaints at that time.  Continue 1:1 for pt's safety.  Sitter at bedside.  Pt remains safe.

## 2012-12-31 NOTE — BHH Group Notes (Cosign Needed)
[facility] [employer] Group Therapy  12/31/2012  1:21 PM  Type of Therapy:  [employer] Presentation   Participation Level:  Did not attend.    Modes of Intervention:  Discussion, Education and Socialization   Summary of Progress/Problems:  [name] from [employer] came to present his recovery story and play the guitar.  CSW intern went to Danaiya's room after group.  Delainie informed CSW intern about her leg spasms and using a wheel chair around the halls.  CSW intern encouraged Jonee to come to group.  Zafira informed CSW intern that she will D/C on Friday and asked about obtaining a wheel chair or walker.  CSW intern will look up more resources/informations about getting a wheel chair or walker.    Simona Huh   12/31/2012  1:21 PM

## 2012-12-31 NOTE — Progress Notes (Signed)
ANTICOAGULATION CONSULT NOTE - Follow Up Consult  Pharmacy Consult for Coumadin  Indication: pulmonary embolus  Allergies  Allergen Reactions  . Darvocet [Propoxyphene-Acetaminophen] Nausea And Vomiting and Other (See Comments)    Shaking      Labs: No INR drawn today   Recent Labs  12/29/12 0620 12/30/12 0640  LABPROT 19.8* 20.8*  INR 1.74* 1.85*    The CrCl is unknown because both a height and weight (above a minimum accepted value) are required for this calculation.   Medications:  Scheduled:  . benztropine  0.5 mg Oral BID  . diazepam  5 mg Oral BID PC  . feeding supplement  237 mL Oral TID WC  . fluPHENAZine  5 mg Oral BID  . naproxen  250 mg Oral BID WC  . nicotine  21 mg Transdermal Daily  . OXcarbazepine  300 mg Oral BID  . polycarbophil  1,250 mg Oral Daily  . traZODone  100 mg Oral Q2000  . Warfarin - Pharmacist Dosing Inpatient   Does not apply q1800    Assessment: INR missed this am  No problems noted in therapy   Goal of Therapy:  INR 2-3    Plan:  Coumadin 7.5 mg x 1 today.PT/INR in am   [name] 12/31/2012,3:32 PM

## 2013-01-01 LAB — PROTIME-INR: Prothrombin Time: 23.7 seconds — ABNORMAL HIGH (ref 11.6–15.2)

## 2013-01-01 MED ORDER — WARFARIN SODIUM 5 MG PO TABS
5.0000 mg | ORAL_TABLET | Freq: Once | ORAL | Status: AC
  Administered 2013-01-01: 5 mg via ORAL
  Filled 2013-01-01: qty 1

## 2013-01-01 MED ORDER — METHOCARBAMOL 500 MG PO TABS
250.0000 mg | ORAL_TABLET | Freq: Three times a day (TID) | ORAL | Status: DC
  Administered 2013-01-01 – 2013-01-02 (×4): 250 mg via ORAL
  Filled 2013-01-01 (×2): qty 0.5
  Filled 2013-01-01: qty 1
  Filled 2013-01-01 (×4): qty 0.5

## 2013-01-01 NOTE — Progress Notes (Signed)
Observation note: Pt in bed sleeping. Sitter at bedside. Pt do not appear to be in distress. Resp are even and unlabored. 1:1 observation maintained for safety. Safety maintained.

## 2013-01-01 NOTE — Progress Notes (Signed)
D: 1-1 note @ 1700: D:Pt ambulating in the [place]; Brightens on approach. Pt reports that her day has been good except still some spasms in her feet. Pt denies SI;HI; & AVH. A: 1-1 continues for increased pt safety. Staff with pt @ all times. Supported & encouraged. R: Pt safety maintained.

## 2013-01-01 NOTE — Progress Notes (Signed)
2100 --1-1 note d/t 1-1. D: Pt is resting in bed with eyes open. Smiled & waved @ this Clinical research associate. Pleasant & cooperative. Pt has ambulated a lot in the [place] this shift. Denies SI,HI, & AVH. A: Pt supported & encouraged . 1-1 continues with staff member with pt @ all times . Supported & encouraged . R: Pt safety maintained.

## 2013-01-01 NOTE — Progress Notes (Signed)
Pt resting in bed with eyes closed.  No distress observed.  Respirations even/unlabored.  Continue 1:1 for safety.  Sitter at bedside.  Pt safe at this time. 

## 2013-01-01 NOTE — BHH Group Notes (Signed)
BHH Group Notes:  (Nursing/MHT/Case Management/Adjunct)  Date:  01/01/2013 Time:  3:48 PM  Type of Therapy:  Group Therapy   Participation Level: Did not attend.    Simona Huh MSW Intern  01/01/2013 3:48 PM

## 2013-01-01 NOTE — Progress Notes (Signed)
Pt resting in bed with eyes closed.  No distress observed.  Continue 1:1 for safety.  Sitter at bedside.  Pt remains safe.

## 2013-01-01 NOTE — Progress Notes (Signed)
Patient ID: [name], female   DOB: October 11, 1970, 42 y.o.   MRN: [id number] Patient ID: [name], female   DOB: 27-Nov-1970, 42 y.o.   MRN: [id number]    [facility] MD Progress Note  12/31/2012 10:30AM [name]  MRN:  [id number]  Subjective:  [name] reports that she is feeling and doing very well today. She adds that she is sleeping well at night. She states that she knows now that she needed to be here to get better. However, says she did not anticipate to develop muscle spasms here, but glad that it getting resolved. She says that she feels that she is ready to be discharged in am. As to what she learned from being in this behavioral health hospital, [name] states that "I learned to take things slower from now on" She currently denies feeling SIHI.  Objective: Observed ambulating on her own with wheel chair. Her gaits are steady. She is able to make good contact. Her thought process is clear and more organized.  DSM5: Schizophrenia Disorders:   DSM5:  AXIS I: Schizoaffective disorder              Post traumatic stress disorder AXIS II: Deferred  AXIS III:  Past Medical History   Diagnosis  Date   .  Pulmonary emboli    .  Cancer    .  Uterus cancer     AXIS IV: other psychosocial or environmental problems and problems related to social environment  AXIS V: 50-60 moderate Symptoms  ADL's:  Intact  Sleep: Poor  Appetite:  Fair  Suicidal Ideation:  denies Homicidal Ideation:  denies AEB (as evidenced by):  Psychiatric Specialty Exam: Review of Systems  Constitutional: Negative for malaise/fatigue.  HENT: Negative.   Eyes: Negative.   Respiratory: Negative.   Cardiovascular: Negative.   Gastrointestinal: Negative.   Genitourinary: Negative.   Musculoskeletal: Negative.  Skin: Negative.   Neurological: Negative.   Endo/Heme/Allergies: Negative.   Psychiatric/Behavioral: Positive for some delusional thoughts . Negative for suicidal ideas, memory loss and substance abuse.       Blood pressure 119/81, pulse 89, temperature 98.3 F (36.8 C), temperature source Oral, resp. rate 20, last menstrual period 11/16/2012, SpO2 95.00%.There is no weight on file to calculate BMI.  General Appearance: Fairly groomed  Eye Contact: Good   Speech:  Clear, appropriate to situation  Volume:  Normal  Mood:  "I'm feeling well"  Affect:  Congruent with mood  Thought Process:  linear  Orientation:  Only to person and place not to time  Thought Content:  Denies hallucinations, some delusional thoughts present.  Suicidal Thoughts:  denies  Homicidal Thoughts:  denies  Memory: Fair  Judgement: Marginal  Insight:  marginal  Psychomotor Activity:  Normal  Concentration:  Fair  Recall:  Fair  Akathisia:  none  Handed:  right  AIMS (if indicated):     Assets:    Sleep:  Number of Hours: 6.25   Current Medications: Current Facility-Administered Medications  Medication Dose Route Frequency Provider Last Rate Last Dose  . alum & mag hydroxide-simeth (MAALOX/MYLANTA) 200-200-20 MG/5ML suspension 30 mL  30 mL Oral Q4H PRN Fransisca Kaufmann, NP      . LORazepam (ATIVAN) tablet 1 mg  1 mg Oral Q8H PRN Shuvon Rankin, NP   1 mg at 12/20/12 0425  . lurasidone (LATUDA) tablet 40 mg  40 mg Oral Q breakfast Mojeed Akintayo   40 mg at 12/20/12 0842  . magnesium hydroxide (MILK  OF MAGNESIA) suspension 30 mL  30 mL Oral Daily PRN Fransisca Kaufmann, NP      . nicotine (NICODERM CQ - dosed in mg/24 hours) patch 21 mg  21 mg Transdermal Daily Shuvon Rankin, NP   21 mg at 12/20/12 0840  . traZODone (DESYREL) tablet 50 mg  50 mg Oral QHS PRN,MR X 1 Fransisca Kaufmann, NP      . warfarin (COUMADIN) tablet 7.5 mg  7.5 mg Oral ONCE-1800 Mojeed Akintayo      . Warfarin - Pharmacist Dosing Inpatient   Does not apply q1800 Mojeed Akintayo        Lab Results:  Results for orders placed during the hospital encounter of 12/22/12 (from the past 48 hour(s))  URINALYSIS, ROUTINE W REFLEX MICROSCOPIC     Status: None    Collection Time    12/24/12  5:59 PM      Result Value Range   Color, Urine YELLOW  YELLOW   APPearance CLEAR  CLEAR   Specific Gravity, Urine 1.013  1.005 - 1.030   pH 7.0  5.0 - 8.0   Glucose, UA NEGATIVE  NEGATIVE mg/dL   Hgb urine dipstick NEGATIVE  NEGATIVE   Bilirubin Urine NEGATIVE  NEGATIVE   Ketones, ur NEGATIVE  NEGATIVE mg/dL   Protein, ur NEGATIVE  NEGATIVE mg/dL   Urobilinogen, UA 0.2  0.0 - 1.0 mg/dL   Nitrite NEGATIVE  NEGATIVE   Leukocytes, UA NEGATIVE  NEGATIVE   Comment: MICROSCOPIC NOT DONE ON URINES WITH NEGATIVE PROTEIN, BLOOD, LEUKOCYTES, NITRITE, OR GLUCOSE <1000 mg/dL.     Performed at [facility]  BASIC METABOLIC PANEL     Status: Abnormal   Collection Time    12/25/12  6:30 AM      Result Value Range   Sodium 137  135 - 145 mEq/L   Potassium 3.7  3.5 - 5.1 mEq/L   Chloride 100  96 - 112 mEq/L   CO2 24  19 - 32 mEq/L   Glucose, Bld 99  70 - 99 mg/dL   BUN 14  6 - 23 mg/dL   Creatinine, Ser 1.61  0.50 - 1.10 mg/dL   Calcium 9.3  8.4 - 09.6 mg/dL   GFR calc non Af Amer 82 (*) >90 mL/min   GFR calc Af Amer >90  >90 mL/min   Comment: (NOTE)     The eGFR has been calculated using the CKD EPI equation.     This calculation has not been validated in all clinical situations.     eGFR's persistently <90 mL/min signify possible Chronic Kidney     Disease.     Performed at [facility]  TSH     Status: None   Collection Time    12/25/12  6:30 AM      Result Value Range   TSH 1.096  0.350 - 4.500 uIU/mL   Comment: Performed at [facility]  PROTIME-INR     Status: Abnormal   Collection Time    12/25/12  6:30 AM      Result Value Range   Prothrombin Time 18.3 (*) 11.6 - 15.2 seconds   INR 1.57 (*) 0.00 - 1.49   Comment: Performed at [facility]  PROTIME-INR     Status: Abnormal   Collection Time    12/26/12  6:25 AM      Result Value Range   Prothrombin Time 17.3 (*) 11.6 - 15.2 seconds  INR 1.45  0.00 - 1.49   Comment: Performed at [facility]    Physical Findings: AIMS: Facial and Oral Movements Muscles of Facial Expression: None, normal Lips and Perioral Area: None, normal Jaw: None, normal Tongue: None, normal, ,  ,  ,    CIWA:    COWS:     Treatment Plan Summary: Daily contact with patient to assess and evaluate symptoms and progress in treatment Medication management  Plan: Continue patient 1:1 observation for safety/fall risk. No longer using wheel chair. Encouraged to hold onto rails for support during ambulation. Discharge plan in progress.  Continue current treatment plan.  Medical Decision Making Problem Points:  Established problem, improving  (2) Data Points:  Review or order medicine tests (1)  I certify that inpatient services furnished can reasonably be expected to improve the patient's condition.  Sanjuana Kava, PMHNP-BC 12/31/2012 10:30 AM

## 2013-01-01 NOTE — Progress Notes (Signed)
Adult Psychoeducational Group Note  Date:  01/01/2013 Time:  11:00 AM  Group Topic/Focus:  Overcoming Stress:   The focus of this group is to define stress and help patients assess their triggers.  Participation Level:  Did Not Attend  Additional Comments:  Pt did not attend group despite being encouraged to do so by staff.  Reinaldo Raddle K 01/01/2013, 11:00 AM

## 2013-01-01 NOTE — Progress Notes (Signed)
Observation note: Pt in bed sleeping with sitter at bedside. Pt do not appear to be in distress. Resp are even and unlabored. Pt compliant with taking meds this morning. Pt continues to have muscle spasms in her right.  Pt remains on 1:1 observation for safety. Safety maintained.

## 2013-01-02 DIAGNOSIS — F1210 Cannabis abuse, uncomplicated: Secondary | ICD-10-CM

## 2013-01-02 LAB — URINE CULTURE: Colony Count: NO GROWTH

## 2013-01-02 LAB — PROTIME-INR: INR: 1.6 — ABNORMAL HIGH (ref 0.00–1.49)

## 2013-01-02 MED ORDER — FLUPHENAZINE HCL 5 MG PO TABS: 5.0000 mg | ORAL_TABLET | Freq: Two times a day (BID) | ORAL | Status: DC

## 2013-01-02 MED ORDER — BENZTROPINE MESYLATE 0.5 MG PO TABS: 0.5000 mg | ORAL_TABLET | Freq: Two times a day (BID) | ORAL | Status: DC

## 2013-01-02 MED ORDER — WARFARIN SODIUM 10 MG PO TABS
10.0000 mg | ORAL_TABLET | Freq: Once | ORAL | Status: DC
  Filled 2013-01-02: qty 1

## 2013-01-02 MED ORDER — WARFARIN SODIUM 2.5 MG PO TABS: 2.5000 mg | ORAL_TABLET | ORAL | Status: DC

## 2013-01-02 MED ORDER — METHOCARBAMOL 500 MG PO TABS: 250.0000 mg | ORAL_TABLET | Freq: Three times a day (TID) | ORAL | Status: DC

## 2013-01-02 MED ORDER — OXCARBAZEPINE 300 MG PO TABS: 300.0000 mg | ORAL_TABLET | Freq: Two times a day (BID) | ORAL | Status: DC

## 2013-01-02 MED ORDER — DIAZEPAM 5 MG PO TABS: 5.0000 mg | ORAL_TABLET | Freq: Two times a day (BID) | ORAL | Status: DC

## 2013-01-02 MED ORDER — WARFARIN SODIUM 7.5 MG PO TABS
7.5000 mg | ORAL_TABLET | Freq: Every day | ORAL | Status: DC
  Filled 2013-01-02: qty 2

## 2013-01-02 MED ORDER — CALCIUM POLYCARBOPHIL 625 MG PO TABS: 1250.0000 mg | ORAL_TABLET | Freq: Every day | ORAL | Status: DC

## 2013-01-02 MED ORDER — WARFARIN SODIUM 5 MG PO TABS: 5.0000 mg | ORAL_TABLET | Freq: Every day | ORAL | Status: DC

## 2013-01-02 MED ORDER — TRAZODONE HCL 100 MG PO TABS: 100.0000 mg | ORAL_TABLET | Freq: Every day | ORAL | Status: DC

## 2013-01-02 NOTE — Tx Team (Addendum)
  Interdisciplinary Treatment Plan Update   Date Reviewed:  01/02/2013  Time Reviewed:  10:34 AM  Progress in Treatment:   Attending groups: No Participating in groups: No Taking medication as prescribed: Yes  Tolerating medication: Yes Family/Significant other contact made: Yes  Patient understands diagnosis: Yes AEB patient has been taking medications consistently and delusions have been eliminated and affect per observation is more open and bring/calm.  Discussing patient identified problems/goals with staff: Yes Medical problems stabilized or resolved: Yes Denies suicidal/homicidal ideation: Yes Patient has not harmed self or others: Yes  For review of initial/current patient goals, please see plan of care.  Estimated Length of Stay:  DC today   Reason for Continuation of Hospitalization: None, patient has met goals for discharge  New Problems/Goals identified: N/A  Discharge Plan or Barriers:  Pt will follow up at [facility] for medication management and therapy.   Additional Comments:  Patient has not been attending group due to physical limitations, however MSW intern has been meeting with patient individual and patient has been making improvements. Agreeable to DC plan and home today.  Attendees:  Signature: Thedore Mins, MD 01/02/2013 10:34 AM   Signature: Ashley Jacobs, LCSW 01/02/2013 10:34 AM  Signature: Simona Huh, SW intern 01/02/2013 10:34 AM  Signature: Lamount Cranker, RN 01/02/2013 10:34 AM  Signature: Bertha Stakes, RN 01/02/2013 10:34 AM  Signature:  01/02/2013 10:34 AM  Signature:   01/02/2013 10:34 AM  Signature:    Signature:    Signature:    Signature:    Signature:    Signature:      Scribe for Treatment Team:   Ashley Jacobs, LCSW  01/02/2013 10:34 AM

## 2013-01-02 NOTE — BHH Suicide Risk Assessment (Signed)
Suicide Risk Assessment  Discharge Assessment     Demographic Factors:  Caucasian, Low socioeconomic status, Unemployed and patient denies suicidal ideation, intent or plan  Mental Status Per Nursing Assessment::   On Admission:     Current Mental Status by Physician: patient denies suicidal ideation, intent or plan  Loss Factors: Decrease in vocational status, Decline in physical health and Financial problems/change in socioeconomic status  Historical Factors: NA  Risk Reduction Factors:   Sense of responsibility to family, Living with another person, especially a relative and Positive social support  Continued Clinical Symptoms:  Resolving psychosis and delusions  Cognitive Features That Contribute To Risk:  Closed-mindedness Polarized thinking    Suicide Risk:  Minimal: No identifiable suicidal ideation.  Patients presenting with no risk factors but with morbid ruminations; may be classified as minimal risk based on the severity of the depressive symptoms  Discharge Diagnoses:   AXIS I:  Post Traumatic Stress Disorder , Schizophrenia, paranoid              Cannabis use disorder  AXIS II:  Deferred AXIS III:   Past Medical History  Diagnosis Date  . Pulmonary emboli   . Schizophrenia   . Cancer   . Uterus cancer   . Anxiety   . Depression    AXIS IV:  other psychosocial or environmental problems and problems related to social environment AXIS V:  61-70 mild symptoms  Plan Of Care/Follow-up recommendations:  Activity:  as tolerated Diet:  healthy Tests:  routine Other:  patient to keep her after care appointment  Is patient on multiple antipsychotic therapies at discharge:  No   Has Patient had three or more failed trials of antipsychotic monotherapy by history:  No  Recommended Plan for Multiple Antipsychotic Therapies: N/A  Thedore Mins, MD 01/02/2013, 8:46 AM

## 2013-01-02 NOTE — Progress Notes (Signed)
D) Pt being discharged to home accompanied by her family. Affect and mood are appropriate. Denies SI and HI, delusions and hallucinations.  A) Given support and reassurance, along with praise. All Pt's belongings returned to her. Follow up plans along with teach me back of her medications done.  R) Pt was able to state when she has an appointment, with whom, and how to take her medications. Denies SI and HI.

## 2013-01-02 NOTE — Progress Notes (Addendum)
[facility] Adult Case Management Discharge Plan :  Will you be returning to the same living situation after discharge: Yes, with significant other, Mickey  At discharge, do you have transportation home?: Yes, significant other will pick up pt.   Do you have the ability to pay for your medications:Yes,  no barriers  Release of information consent forms completed and in the chart;  Patient's signature needed at discharge.  Patient to Follow up at: Follow-up Information   Follow up with [facility] On 01/06/2013. (Walk in for hospital discharge appointment, for medication management and therapy.  Walk in clinic is Monday - Friday 8 am - 3 pm.  )    Contact information:   [address], [place] [address] Phone: [phone] Fax: [phone]      Patient denies SI/HI:   Yes,  no reports prior to admission nor upon admission    Safety Planning and Suicide Prevention discussed:  Yes,  see SI note  Nail, Catalina Gravel 01/02/2013, 10:32 AM

## 2013-01-02 NOTE — BHH Group Notes (Signed)
[facility] LCSW Aftercare Discharge Planning Group Note   01/02/2013 11:44 AM  Participation Quality:  DID NOT ATTEND. Asleep in Bed.  Talked to pt individually, pt informed that she is able to walk now.  CSW intern gave her information about [facility] in case she would like to have a wheel chair/walker.   CSW intern asked her if she needs anything else, pt did not need anything at this moment.   Discussed patient in treatment team meeting, and patient is stable today and not as delusional per report of MD and RNs. Patient will follow up at [facility] where she is a current patient and has a supportive boyfriend.   Patient will DC today   Simona Huh

## 2013-01-02 NOTE — Progress Notes (Signed)
Recreation Therapy Notes  Date: 10.03.2014 Time: 9:30am Location: [address]  Group Topic: General Recreation  Behavioral Response: Did not attend    [employer], LRT/CTRS  Jearl Klinefelter 01/02/2013 12:10 PM

## 2013-01-02 NOTE — Discharge Summary (Signed)
Physician Discharge Summary Note  Patient:  [name] is an 42 y.o., female MRN:  [id number] DOB:  Aug 14, 1970 Patient phone:  [phone] (home)  Patient address:   [address] [place] [address],   Date of Admission:  12/22/2012 Date of Discharge: 01/02/13  Reason for Admission:  Mood stabilization  Discharge Diagnoses: Principal Problem:   Schizophrenia, paranoid Active Problems:   PTSD (post-traumatic stress disorder)  Review of Systems  Constitutional: Negative.   HENT: Negative.   Eyes: Negative.   Respiratory: Negative.   Cardiovascular: Negative.   Gastrointestinal: Negative.   Genitourinary: Negative.   Musculoskeletal: Negative.   Skin: Negative.   Neurological: Negative.   Endo/Heme/Allergies: Negative.   Psychiatric/Behavioral: Positive for hallucinations (Hx of) and substance abuse (Cannabis abuse). Negative for depression, suicidal ideas and memory loss. The patient is nervous/anxious (Stabilized with medication prior to discharge) and has insomnia (Stabilized with medication prior to discharge).    DSM5: Schizophrenia Disorders:  Schizophrenia (295.7), Schizo affective schizophrenia  Obsessive-Compulsive Disorders:  NA Trauma-Stressor Disorders:  NA Substance/Addictive Disorders:  Cannabis Use Disorder - Moderate 9304.30) Depressive Disorders:  Schizo affective schizophrenia   Axis Diagnosis:  AXIS I:  Schizo affective schizophrenia, Schizophrenis, Cannabis dependence AXIS II:  Deferred AXIS III:   Past Medical History  Diagnosis Date  . Pulmonary emboli   . Schizophrenia   . Cancer   . Uterus cancer   . Anxiety   . Depression    AXIS IV:  other psychosocial or environmental problems and Chronic mental illness AXIS V:  61  Level of Care:  OP  Hospital Course: History of Present Illness: [name] is a 42 year old female who was brought to the [facility] Emergency Department after family noticed that the patient was not acting  like herself. The patient has a history of schizophrenia and was a patient at [facility] in 2013. The patient's family reported to ED staff that she has not been taking any medications. Her urine drugs screen was positive for THC and benzos. She is difficult to assess due to her being actively psychotic and delusional. Patient observed sitting in her room writing on a piece of paper. She was able to answer basic questions but her thoughts were very disorganized. At times the patient would give a totally unrelated answer to this writer's questions. Patient stated "I came to get away from my old man. He put me in here but I don't know why. A woman of flesh and bone has merged with me. The Antichrist is outside. My protection is dwindling. Did you see any men surrounding this building when you came in here? I have heard the voice of the Antichrist. He has been in here. I am so afraid. This has been going on a long time." The patient became tearful when talking about her fears. She was observed staring out the window during the interview and then writing a piece of paper that had many sentences the patient had written down such as "I must kill the Antichrist."   While a patient in this hospital and after admission assessment/evaluation, it was determined based on patient's symptoms that she will need medication management to stabilize her current psychotic mood symptoms. [name] also needed 1:1 supervision for safety reasons due fall risks most of her stay in this hospital. She was provided with wheelchair to aid her mobility, propelled by staff as well. [name] was ordered and received Prolixin 5 mg bid for mood control, Trileptal 300 mg bid  for mood stabilization, Valium 5 mg bid for anxiety and Trazodone 50 mg Q bedtime for sleep. She also was enrolled in group counseling sessions and activities where she was counseled and learned coping skills that should help her cope better and manage her symptoms effectively after  discharge. She also received medication management and monitoring for her other previously existing medical issues and concerns that she presented. She tolerated her treatment regimen without any significant adverse effects and or reactions presented.   Patient did respond positively to her treatment regimen. This is evidenced by her daily reports of improved mood, reduction of symptoms, presentation of good affect/eye contact and decreased hallucinations and delusions. She attended treatment team meeting this am and met with her treatment team members. Her reason for admission, present symptoms, response to treatment and discharge plans discussed with patient. Dustine endorsed that her symptoms has stabilized and that she is ready for discharge to pursue psychiatric care on outpatient basis. It was then agreed upon that patient will follow-up care at the [facility] psychiatric clinic here in [place], [place] between the hours of 08:00 am and 03:00 pm Monday thru Friday. [name] has been instructed that this is a walk-in appointment as well. The address, date, time and contact information for this clinic provided for patient in writing.  Upon discharge, [name] adamantly denies any suicidal, homicidal ideations, auditory, visual hallucinations, paranoia and or delusional thoughts. She was provided with 14 days worth supply samples of her [facility] discharge medications. She left [facility] with all personal belongings via personal arranged transport in no apparent distress.  Consults:  psychiatry  Significant Diagnostic Studies:  labs: CBC with diff, CMP, UDS, Toxicology tests, U/A  Discharge Vitals:   Blood pressure 92/66, pulse 99, temperature 98.4 F (36.9 C), temperature source Oral, resp. rate 16, last menstrual period 11/16/2012, SpO2 95.00%. There is no weight on file to calculate BMI. Lab Results:   Results for orders placed during the hospital encounter of 12/22/12 (from the past 72 hour(s))  URINE CULTURE      Status: None   Collection Time    12/31/12  5:22 PM      Result Value Range   Specimen Description       Value: URINE, RANDOM     Performed at [facility]   Special Requests       Value: URINE, RANDOM     Performed at [facility]   Culture  Setup Time       Value: 01/01/2013 02:35     Performed at [facility]   Colony Count       Value: NO GROWTH     Performed at [facility]   Culture       Value: NO GROWTH     Performed at [facility]   Report Status 01/02/2013 FINAL    PROTIME-INR     Status: Abnormal   Collection Time    01/01/13  6:20 AM      Result Value Range   Prothrombin Time 23.7 (*) 11.6 - 15.2 seconds   INR 2.20 (*) 0.00 - 1.49   Comment: Performed at [facility]  PROTIME-INR     Status: Abnormal   Collection Time    01/02/13  6:30 AM      Result Value Range   Prothrombin Time 18.6 (*) 11.6 - 15.2 seconds   INR 1.60 (*) 0.00 - 1.49   Comment: Performed at [facility]    Physical Findings: AIMS: Facial and Oral Movements Muscles of Facial Expression: None, normal Lips and Perioral Area: None, normal Jaw: None, normal Tongue: None, normal,Extremity Movements Upper (arms, wrists, hands, fingers): None, normal Lower (legs, knees, ankles, toes): None, normal, Trunk Movements Neck, shoulders, hips: None, normal, Overall Severity Severity of abnormal movements (highest score from questions above): None, normal Incapacitation due to abnormal movements: None, normal Patient's awareness of abnormal movements (rate only patient's report): No Awareness, Dental Status Current problems with teeth and/or dentures?: No Does patient usually wear dentures?: No  CIWA:    COWS:     Psychiatric Specialty Exam: See Psychiatric Specialty Exam and Suicide Risk Assessment completed by Attending Physician prior to discharge.  Discharge destination:  Home  Is patient on  multiple antipsychotic therapies at discharge:  No   Has Patient had three or more failed trials of antipsychotic monotherapy by history:  No  Recommended Plan for Multiple Antipsychotic Therapies: NA     Medication List       Indication   benztropine 0.5 MG tablet  Commonly known as:  COGENTIN  Take 1 tablet (0.5 mg total) by mouth 2 (two) times daily. For prevention of drug related involuntary movements   Indication:  Extrapyramidal Reaction caused by Medications     diazepam 5 MG tablet  Commonly known as:  VALIUM  Take 1 tablet (5 mg total) by mouth 2 (two) times daily after a meal. For anxiety/muscle spasms   Indication:  Feeling Anxious, Trouble Sleeping, Muscle Spasm, Muscle Spasticity     fluPHENAZine 5 MG tablet  Commonly known as:  PROLIXIN  Take 1 tablet (5 mg total) by mouth 2 (two) times daily. For mood control   Indication:  Psychosis, Schizophrenia     methocarbamol 500 MG tablet  Commonly known as:  ROBAXIN  Take 0.5 tablets (250 mg total) by mouth 3 (three) times daily. For muscle spasms   Indication:  Musculoskeletal Pain, Spasms     Oxcarbazepine 300 MG tablet  Commonly known as:  TRILEPTAL  Take 1 tablet (300 mg total) by mouth 2 (two) times daily. For mood stabilization   Indication:  Mood stabilization     polycarbophil 625 MG tablet  Commonly known as:  FIBERCON  Take 2 tablets (1,250 mg total) by mouth daily. (This medicine may be purchased from over the counter at yr local pharmacy): For constipation   Indication:  Constipation     traZODone 100 MG tablet  Commonly known as:  DESYREL  Take 1 tablet (100 mg total) by mouth daily at 8 pm. For sleep   Indication:  Trouble Sleeping     warfarin 2.5 MG tablet  Commonly known as:  COUMADIN  Take 1 tablet (2.5 mg total) by mouth once a week. Only on Sunday: For thinning blood   Indication:  Blood thinner     warfarin 5 MG tablet  Commonly known as:  COUMADIN  Take 1 tablet (5 mg total) by mouth  daily. Take one tablet by mouth everyday except sundays:  For blood thinner.   Indication:  Blood thinner       Follow-up Information   Follow up with [facility] On 01/06/2013. (Walk in for hospital discharge appointment, for medication management and therapy.  Walk in clinic is Monday - Friday 8 am - 3 pm.  )    Contact information:   [address], [place] [address] Phone: [phone] Fax: [phone]  Follow-up recommendations: Activity:  As tolerated Diet: As recommended by your primary care doctor. Keep all scheduled follow-up appointments as recommended.   Comments:  Take all your medications as prescribed by your mental healthcare provider. Report any adverse effects and or reactions from your medicines to your outpatient provider promptly. Patient is instructed and cautioned to not engage in alcohol and or illegal drug use while on prescription medicines. In the event of worsening symptoms, patient is instructed to call the crisis hotline, 911 and or go to the nearest ED for appropriate evaluation and treatment of symptoms. Follow-up with your primary care provider for your other medical issues, concerns and or health care needs.   Total Discharge Time:  Greater than 30 minutes.  Signed: Armandina Stammer I, PMHNP-BC, FNP-BC 01/02/2013, 12:17 PM

## 2013-01-02 NOTE — Progress Notes (Signed)
Pt observed resting in bed with eyes closed. RR WNL, even and unlabored. 1:1 obs in place with MHT at bedside for pt safety. Pt remains safe. [name]

## 2013-01-02 NOTE — Progress Notes (Signed)
Pt continues to rest without difficulty. No distress noted. No complaints voiced. 1:1 obs remains in place for pt safety and pt is safe. [name]

## 2013-01-02 NOTE — Progress Notes (Signed)
Adult Psychoeducational Group Note  Date:  01/02/2013 Time:  11:32 AM  Group Topic/Focus:  Relapse Prevention Planning:   The focus of this group is to define relapse and discuss the need for planning to combat relapse.  Participation Level:  Did Not Attend   Montserrath Madding N 01/02/2013, 11:32 AM 

## 2013-01-06 ENCOUNTER — Emergency Department (HOSPITAL_COMMUNITY)
Admission: EM | Admit: 2013-01-06 | Discharge: 2013-01-08 | Disposition: A | Discharge status: Home / Self Care | Payer: Medicaid Other | Attending: Emergency Medicine | Admitting: Emergency Medicine

## 2013-01-06 ENCOUNTER — Encounter (HOSPITAL_COMMUNITY): Payer: Self-pay | Admitting: *Deleted

## 2013-01-06 ENCOUNTER — Emergency Department (HOSPITAL_COMMUNITY)
Admission: EM | Admit: 2013-01-06 | Discharge: 2013-01-06 | Disposition: A | Discharge status: Home / Self Care | Payer: Medicaid Other | Source: Home / Self Care | Attending: Emergency Medicine | Admitting: Emergency Medicine

## 2013-01-06 DIAGNOSIS — Z7901 Long term (current) use of anticoagulants: Secondary | ICD-10-CM | POA: Insufficient documentation

## 2013-01-06 DIAGNOSIS — Z79899 Other long term (current) drug therapy: Secondary | ICD-10-CM | POA: Insufficient documentation

## 2013-01-06 DIAGNOSIS — F17200 Nicotine dependence, unspecified, uncomplicated: Secondary | ICD-10-CM | POA: Insufficient documentation

## 2013-01-06 DIAGNOSIS — F1210 Cannabis abuse, uncomplicated: Secondary | ICD-10-CM | POA: Insufficient documentation

## 2013-01-06 DIAGNOSIS — F200 Paranoid schizophrenia: Secondary | ICD-10-CM | POA: Insufficient documentation

## 2013-01-06 DIAGNOSIS — F1110 Opioid abuse, uncomplicated: Secondary | ICD-10-CM | POA: Insufficient documentation

## 2013-01-06 DIAGNOSIS — F329 Major depressive disorder, single episode, unspecified: Secondary | ICD-10-CM | POA: Insufficient documentation

## 2013-01-06 DIAGNOSIS — Z8542 Personal history of malignant neoplasm of other parts of uterus: Secondary | ICD-10-CM | POA: Insufficient documentation

## 2013-01-06 DIAGNOSIS — Z86711 Personal history of pulmonary embolism: Secondary | ICD-10-CM | POA: Insufficient documentation

## 2013-01-06 DIAGNOSIS — G4700 Insomnia, unspecified: Secondary | ICD-10-CM

## 2013-01-06 DIAGNOSIS — F3289 Other specified depressive episodes: Secondary | ICD-10-CM | POA: Insufficient documentation

## 2013-01-06 DIAGNOSIS — R443 Hallucinations, unspecified: Secondary | ICD-10-CM

## 2013-01-06 DIAGNOSIS — F1310 Sedative, hypnotic or anxiolytic abuse, uncomplicated: Secondary | ICD-10-CM | POA: Insufficient documentation

## 2013-01-06 DIAGNOSIS — F209 Schizophrenia, unspecified: Secondary | ICD-10-CM

## 2013-01-06 DIAGNOSIS — F411 Generalized anxiety disorder: Secondary | ICD-10-CM | POA: Insufficient documentation

## 2013-01-06 DIAGNOSIS — Z3202 Encounter for pregnancy test, result negative: Secondary | ICD-10-CM | POA: Insufficient documentation

## 2013-01-06 DIAGNOSIS — F29 Unspecified psychosis not due to a substance or known physiological condition: Secondary | ICD-10-CM

## 2013-01-06 LAB — URINALYSIS, ROUTINE W REFLEX MICROSCOPIC
Bilirubin Urine: NEGATIVE
Glucose, UA: NEGATIVE mg/dL
Glucose, UA: NEGATIVE mg/dL
Ketones, ur: NEGATIVE mg/dL
Ketones, ur: NEGATIVE mg/dL
Leukocytes, UA: NEGATIVE
Nitrite: NEGATIVE
Specific Gravity, Urine: 1.018 (ref 1.005–1.030)
Urobilinogen, UA: 0.2 mg/dL (ref 0.0–1.0)
pH: 5.5 (ref 5.0–8.0)
pH: 5.5 (ref 5.0–8.0)

## 2013-01-06 LAB — CBC WITH DIFFERENTIAL/PLATELET
Basophils Absolute: 0 10*3/uL (ref 0.0–0.1)
Basophils Absolute: 0.1 10*3/uL (ref 0.0–0.1)
Basophils Relative: 0 % (ref 0–1)
Eosinophils Absolute: 0.4 10*3/uL (ref 0.0–0.7)
HCT: 43.6 % (ref 36.0–46.0)
Hemoglobin: 15.3 g/dL — ABNORMAL HIGH (ref 12.0–15.0)
Hemoglobin: 14.9 g/dL (ref 12.0–15.0)
Lymphocytes Relative: 18 % (ref 12–46)
Lymphs Abs: 3.2 10*3/uL (ref 0.7–4.0)
MCH: 32.4 pg (ref 26.0–34.0)
MCHC: 35.8 g/dL (ref 30.0–36.0)
MCV: 90.4 fL (ref 78.0–100.0)
Monocytes Absolute: 1.9 10*3/uL — ABNORMAL HIGH (ref 0.1–1.0)
Monocytes Relative: 11 % (ref 3–12)
Monocytes Relative: 11 % (ref 3–12)
Neutro Abs: 12.7 10*3/uL — ABNORMAL HIGH (ref 1.7–7.7)
Neutro Abs: 8.5 10*3/uL — ABNORMAL HIGH (ref 1.7–7.7)
Neutrophils Relative %: 71 % (ref 43–77)
Neutrophils Relative %: 59 % (ref 43–77)
Platelets: 508 10*3/uL — ABNORMAL HIGH (ref 150–400)
RBC: 4.83 MIL/uL (ref 3.87–5.11)
RDW: 15.7 % — ABNORMAL HIGH (ref 11.5–15.5)
RDW: 15.9 % — ABNORMAL HIGH (ref 11.5–15.5)
WBC: 18.1 10*3/uL — ABNORMAL HIGH (ref 4.0–10.5)

## 2013-01-06 LAB — URINE MICROSCOPIC-ADD ON

## 2013-01-06 LAB — BASIC METABOLIC PANEL
BUN: 11 mg/dL (ref 6–23)
CO2: 24 mEq/L (ref 19–32)
Chloride: 94 mEq/L — ABNORMAL LOW (ref 96–112)
Creatinine, Ser: 0.8 mg/dL (ref 0.50–1.10)

## 2013-01-06 LAB — RAPID URINE DRUG SCREEN, HOSP PERFORMED
Amphetamines: NOT DETECTED
Amphetamines: NOT DETECTED
Benzodiazepines: POSITIVE — AB
Cocaine: NOT DETECTED
Cocaine: NOT DETECTED
Opiates: POSITIVE — AB
Opiates: POSITIVE — AB
Tetrahydrocannabinol: POSITIVE — AB

## 2013-01-06 LAB — COMPREHENSIVE METABOLIC PANEL
ALT: 22 U/L (ref 0–35)
AST: 42 U/L — ABNORMAL HIGH (ref 0–37)
Albumin: 3.8 g/dL (ref 3.5–5.2)
Alkaline Phosphatase: 95 U/L (ref 39–117)
BUN: 7 mg/dL (ref 6–23)
GFR calc non Af Amer: 89 mL/min — ABNORMAL LOW (ref >90)
Potassium: 3.1 mEq/L — ABNORMAL LOW (ref 3.5–5.1)
Sodium: 134 mEq/L — ABNORMAL LOW (ref 135–145)
Total Protein: 7.6 g/dL (ref 6.0–8.3)

## 2013-01-06 LAB — GLUCOSE, CAPILLARY: Glucose-Capillary: 141 mg/dL — ABNORMAL HIGH (ref 70–99)

## 2013-01-06 LAB — PROTIME-INR: Prothrombin Time: 38.6 seconds — ABNORMAL HIGH (ref 11.6–15.2)

## 2013-01-06 MED ORDER — CALCIUM POLYCARBOPHIL 625 MG PO TABS
1250.0000 mg | ORAL_TABLET | Freq: Every day | ORAL | Status: DC
Start: 2013-01-06 — End: 2013-01-08
  Administered 2013-01-06 – 2013-01-07 (×2): 1250 mg via ORAL
  Filled 2013-01-06 (×3): qty 2

## 2013-01-06 MED ORDER — TRAZODONE HCL 100 MG PO TABS
100.0000 mg | ORAL_TABLET | Freq: Every day | ORAL | Status: DC
  Administered 2013-01-06 – 2013-01-07 (×2): 100 mg via ORAL
  Filled 2013-01-06 (×2): qty 1

## 2013-01-06 MED ORDER — CYCLOBENZAPRINE HCL 10 MG PO TABS
5.0000 mg | ORAL_TABLET | Freq: Three times a day (TID) | ORAL | Status: DC | PRN
  Administered 2013-01-08: 10 mg via ORAL
  Filled 2013-01-06: qty 1

## 2013-01-06 MED ORDER — ZOLPIDEM TARTRATE 5 MG PO TABS: 5.0000 mg | ORAL_TABLET | Freq: Every evening | ORAL | Status: DC | PRN

## 2013-01-06 MED ORDER — NICOTINE 21 MG/24HR TD PT24
21.0000 mg | MEDICATED_PATCH | Freq: Every day | TRANSDERMAL | Status: DC
  Administered 2013-01-06 – 2013-01-07 (×2): 21 mg via TRANSDERMAL
  Filled 2013-01-06 (×2): qty 1

## 2013-01-06 MED ORDER — TRAZODONE HCL 100 MG PO TABS: 100.0000 mg | ORAL_TABLET | Freq: Every day | ORAL | Status: DC

## 2013-01-06 MED ORDER — ALUM & MAG HYDROXIDE-SIMETH 200-200-20 MG/5ML PO SUSP: 30.0000 mL | ORAL | Status: DC | PRN

## 2013-01-06 MED ORDER — METHOCARBAMOL 500 MG PO TABS
250.0000 mg | ORAL_TABLET | Freq: Three times a day (TID) | ORAL | Status: DC
  Administered 2013-01-06 – 2013-01-07 (×2): 250 mg via ORAL
  Administered 2013-01-07: 500 mg via ORAL
  Administered 2013-01-07: 250 mg via ORAL
  Filled 2013-01-06 (×5): qty 1

## 2013-01-06 MED ORDER — FLUPHENAZINE HCL 5 MG PO TABS
5.0000 mg | ORAL_TABLET | Freq: Two times a day (BID) | ORAL | Status: DC
  Administered 2013-01-06 – 2013-01-07 (×3): 5 mg via ORAL
  Filled 2013-01-06 (×5): qty 1

## 2013-01-06 MED ORDER — WARFARIN - PHARMACIST DOSING INPATIENT: Freq: Every day | Status: DC

## 2013-01-06 MED ORDER — LORAZEPAM 1 MG PO TABS
1.0000 mg | ORAL_TABLET | Freq: Three times a day (TID) | ORAL | Status: DC | PRN
  Administered 2013-01-07: 1 mg via ORAL
  Filled 2013-01-06: qty 1

## 2013-01-06 MED ORDER — ONDANSETRON HCL 4 MG PO TABS: 4.0000 mg | ORAL_TABLET | Freq: Three times a day (TID) | ORAL | Status: DC | PRN

## 2013-01-06 MED ORDER — IBUPROFEN 200 MG PO TABS: 600.0000 mg | ORAL_TABLET | Freq: Three times a day (TID) | ORAL | Status: DC | PRN

## 2013-01-06 MED ORDER — BENZTROPINE MESYLATE 1 MG PO TABS
0.5000 mg | ORAL_TABLET | Freq: Two times a day (BID) | ORAL | Status: DC
  Administered 2013-01-06 – 2013-01-07 (×2): 1 mg via ORAL
  Administered 2013-01-07: 0.5 mg via ORAL
  Filled 2013-01-06 (×4): qty 1

## 2013-01-06 MED ORDER — DIAZEPAM 5 MG PO TABS
5.0000 mg | ORAL_TABLET | Freq: Two times a day (BID) | ORAL | Status: DC
  Administered 2013-01-07 (×2): 5 mg via ORAL
  Filled 2013-01-06 (×3): qty 1

## 2013-01-06 MED ORDER — WARFARIN SODIUM 5 MG PO TABS: 5.0000 mg | ORAL_TABLET | Freq: Every day | ORAL | Status: DC

## 2013-01-06 MED ORDER — OXCARBAZEPINE 300 MG PO TABS
300.0000 mg | ORAL_TABLET | Freq: Two times a day (BID) | ORAL | Status: DC
  Administered 2013-01-06 – 2013-01-07 (×3): 300 mg via ORAL
  Filled 2013-01-06 (×5): qty 1

## 2013-01-06 MED ORDER — TRAZODONE HCL 100 MG PO TABS
100.0000 mg | ORAL_TABLET | Freq: Every day | ORAL | Status: DC
  Administered 2013-01-06: 100 mg via ORAL
  Filled 2013-01-06: qty 1

## 2013-01-06 NOTE — Progress Notes (Signed)
ANTICOAGULATION CONSULT NOTE - Initial Consult  Pharmacy Consult for Warfarin Indication: H/O Pulmonary Embolism  Allergies  Allergen Reactions  . Darvocet [Propoxyphene-Acetaminophen] Nausea And Vomiting and Other (See Comments)    Shaking     Patient Measurements: Weight: 158 lb (71.668 kg)   Vital Signs: Temp: 98.4 F (36.9 C) (10/07 2003) Temp src: Oral (10/07 2003) BP: 117/78 mmHg (10/07 2003) Pulse Rate: 116 (10/07 2003)  Labs:  Recent Labs  01/06/13 0209 01/06/13 0310  HGB 15.3*  --   HCT 43.6  --   PLT 503*  --   LABPROT  --  35.8*  INR  --  3.77*  CREATININE 0.80  --     The CrCl is unknown because both a height and weight (above a minimum accepted value) are required for this calculation.   Medical History: Past Medical History  Diagnosis Date  . Pulmonary emboli   . Schizophrenia   . Cancer   . Uterus cancer   . Anxiety   . Depression     Medications:  Scheduled:  . benztropine  0.5 mg Oral BID  . [START ON 01/07/2013] diazepam  5 mg Oral BID PC  . fluPHENAZine  5 mg Oral BID  . methocarbamol  250 mg Oral TID  . nicotine  21 mg Transdermal Daily  . Oxcarbazepine  300 mg Oral BID  . polycarbophil  1,250 mg Oral Daily  . traZODone  100 mg Oral Q2000  . [START ON 01/07/2013] Warfarin - Pharmacist Dosing Inpatient   Does not apply q1800   Infusions:    Assessment:  43 yr female with H/O Pulmonary Embolism on warfarin 5mg  daily except 7.5mg  Sundays PTA.  INR this AM = 3.77 with last dose reported by patient having been taken today (10/7)  Patient with significant psych history and in ED tonight with family reports of pt hallucinating  Pharmacy to manage warfarin therapy while patient in hospital  Goal of Therapy:  INR 2-3   Plan:  No additional warfarin needed tonight Check daily PT/INR  Kaylon Laroche, Joselyn Glassman, PharmD 01/06/2013,10:01 PM

## 2013-01-06 NOTE — ED Notes (Signed)
[name] [phone] for emergency contact, [phone]

## 2013-01-06 NOTE — ED Notes (Signed)
Pt states that she can't sleep well and wants to make sure her potassium level is ok; recently discharged from [facility] on Friday; per family for past two days unable to sleep; locked out of house tonight and started walking--someone brought pt to friends house and she just sat for hours; pt denies si/hi; pt states feels ok otherwise; friend states pt not eating right

## 2013-01-06 NOTE — ED Notes (Signed)
Per friend pt going into other rooms and acting like she is moving things with her hands; pt calm and cooperative; states she doesn't want to go back to [facility]
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
## 2013-01-06 NOTE — ED Notes (Signed)
Family states that pt was discharged from [facility] on 10/1;  Pt reports that they changed her medications around; family reports that pt has not slept in 3 days and has been hallucinating; pt states that she needs to be checked back in; pt states that she has not been on her Wafarin, Valium, Hydrocodone and Flexeril. Pt states that she does not want to be here but just wants her medications back prescribed.

## 2013-01-07 ENCOUNTER — Encounter (HOSPITAL_COMMUNITY): Payer: Self-pay | Admitting: Registered Nurse

## 2013-01-07 DIAGNOSIS — F29 Unspecified psychosis not due to a substance or known physiological condition: Secondary | ICD-10-CM

## 2013-01-07 LAB — PROTIME-INR: INR: 4.42 — ABNORMAL HIGH (ref 0.00–1.49)

## 2013-01-07 NOTE — Progress Notes (Signed)
CSW consulted by EDP Docherty concerning patient wanting to go home and EDP not feeling it was safe to discharge patient at this time, as pt presented as still needing inpatient psychiatric care.  CSW discussed with EDP that she could IVC patient if concerned.  CSW assisted EDP with IVC paperwork.  ED secretary Eunice Blase reported that she would notarize paperwork with EDP and fax to night magistrate.  Marva Panda, LCSWA  [phone]  01/07/2013  10:26 pm

## 2013-01-07 NOTE — Progress Notes (Signed)
ANTICOAGULATION CONSULT NOTE - Follow up Consult  Pharmacy Consult for Warfarin Indication: H/O Pulmonary Embolism  Allergies  Allergen Reactions  . Darvocet [Propoxyphene-Acetaminophen] Nausea And Vomiting and Other (See Comments)    Shaking     Patient Measurements: Weight: 158 lb (71.668 kg)  Vital Signs: Temp: 98.5 F (36.9 C) (10/08 0600) Temp src: Oral (10/08 0600) BP: 129/56 mmHg (10/08 0600) Pulse Rate: 101 (10/08 0600)  Labs:  Recent Labs  01/06/13 0209 01/06/13 0310 01/06/13 2145 01/07/13 0608  HGB 15.3*  --  14.9  --   HCT 43.6  --  41.6  --   PLT 503*  --  508*  --   LABPROT  --  35.8* 38.6* 40.4*  INR  --  3.77* 4.16* 4.42*  CREATININE 0.80  --  0.80  --     The CrCl is unknown because both a height and weight (above a minimum accepted value) are required for this calculation.   Medications:  Scheduled:  . benztropine  0.5 mg Oral BID  . diazepam  5 mg Oral BID PC  . fluPHENAZine  5 mg Oral BID  . methocarbamol  250 mg Oral TID  . nicotine  21 mg Transdermal Daily  . Oxcarbazepine  300 mg Oral BID  . polycarbophil  1,250 mg Oral Daily  . traZODone  100 mg Oral Q2000  . 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  Patient with significant psych history and in ED family reports pt hallucinating.  Pharmacy to manage warfarin therapy while patient in hospital.  Home dose reportedly 5mg  daily except 7.5mg  Sundays PTA, last dose on 10/7  INR remains supratherapeutic and increased to 4.42  CBC stable, no bleeding reported.  Goal of Therapy:  INR 2-3   Plan:  Continue to hold warfarin today. F/u daily INR.  Lynann Beaver PharmD, BCPS Pager [phone] 01/07/2013 10:14 AM
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
## 2013-01-07 NOTE — ED Notes (Signed)
Pt informed RN that she wanted to go home and did not want to stay or be admitted for tx. MD informed.

## 2013-01-07 NOTE — Progress Notes (Signed)
Pt referred to [place], [facility],  [place], [place], [facility] pending review.  Pt Declined from [facility].   No beds [facility], [address], [place], [place], and [facility]	[place].   Oncoming TTS/CSW to refer pt to [facility] wait list.   .Catha Gosselin, LCSW [phone]  ED CSW  .01/07/2013 1414pm

## 2013-01-07 NOTE — Discharge Summary (Signed)
Seen and agreed. Azrael Huss, MD 

## 2013-01-07 NOTE — Treatment Plan (Signed)
Milly has been declined at [facility] by Fransisca Kaufmann, NP due just being released after a 2 week hospitalization at [facility] where she needed constant observation due to throwing herself from the bed on a daily basis, refusing to walk or participate in the therapeutic mileu.  The recommendation is for placement to be sought at a long term facility.

## 2013-01-07 NOTE — ED Notes (Signed)
ACT at bedside 
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
## 2013-01-07 NOTE — ED Provider Notes (Signed)
CSN: [id number]     Arrival date & time 01/06/13  0146 History   First MD Initiated Contact with Patient 01/06/13 0320     Chief Complaint  Patient presents with  . Insomnia   (Consider location/radiation/quality/duration/timing/severity/associated sxs/prior Treatment) HPI History provided by pt, her friend and prior chart.   Patient's friend brought patient to ED this morning d/t psychosis.  Reports that she was standing in the middle of [address], throwing her arms out as if she was tossing dust. She has been talking to herself as well.  She is not behaving normally.  Patient reports that she has been having hallucinations, but they are stable.  She accidentally locked herself out of the house and decided to go for a walk to try to find her cousin, who she lives with and was at work at the time.  She has not been able to sleep in the past three nights and believes she is just over-exhausted.  She is out of her trazodone and believes that if this prescription is refilled for her, she will be fine.  She has f/u with her PCP as well as her psychiatrist the day after tomorrow.  Per prior chart, pt admitted to [facility] 9/22-10/3 for mood stabilization when presented to ED w/ similar complaint.  She has h/o paranoid schizophrenia and PTSD.  Pt denies SI/HI, drug and alcohol abuse.  She has been compliant w/ the rest of her medications, including warfarin.  Past Medical History  Diagnosis Date  . Pulmonary emboli   . Schizophrenia   . Cancer   . Uterus cancer   . Anxiety   . Depression    Past Surgical History  Procedure Laterality Date  . No past surgeries    . Uterine fibroid surgery      uterine cancer   No family history on file. History  Substance Use Topics  . Smoking status: Current Every Day Smoker -- 3.00 packs/day for 15 years    Types: Cigarettes  . Smokeless tobacco: Never Used  . Alcohol Use: No   OB History   Grav Para Term Preterm Abortions TAB SAB Ect Mult Living                  Review of Systems  All other systems reviewed and are negative.    Allergies  Darvocet  Home Medications   Current Outpatient Rx  Name  Route  Sig  Dispense  Refill  . benztropine (COGENTIN) 0.5 MG tablet   Oral   Take 1 tablet (0.5 mg total) by mouth 2 (two) times daily. For prevention of drug related involuntary movements   60 tablet   0   . diazepam (VALIUM) 5 MG tablet   Oral   Take 1 tablet (5 mg total) by mouth 2 (two) times daily after a meal. For anxiety/muscle spasms   7 tablet   0   . fluPHENAZine (PROLIXIN) 5 MG tablet   Oral   Take 1 tablet (5 mg total) by mouth 2 (two) times daily. For mood control   60 tablet   0   . methocarbamol (ROBAXIN) 500 MG tablet   Oral   Take 0.5 tablets (250 mg total) by mouth 3 (three) times daily. For muscle spasms   90 tablet   0   . Oxcarbazepine (TRILEPTAL) 300 MG tablet   Oral   Take 1 tablet (300 mg total) by mouth 2 (two) times daily. For mood stabilization   60 tablet  0   . polycarbophil (FIBERCON) 625 MG tablet   Oral   Take 2 tablets (1,250 mg total) by mouth daily. (This medicine may be purchased from over the counter at yr local pharmacy): For constipation         . warfarin (COUMADIN) 5 MG tablet   Oral   Take 5-7.5 mg by mouth daily. Take 1 tablet all days except Sunday take 1 & 1/2 tablet         . cyclobenzaprine (FLEXERIL) 5 MG tablet   Oral   Take 5 mg by mouth 3 (three) times daily as needed for muscle spasms (muscle spsasms).         . traZODone (DESYREL) 100 MG tablet   Oral   Take 1 tablet (100 mg total) by mouth daily at 8 pm. For sleep   5 tablet   0    BP 126/77  Pulse 98  Temp(Src) 97.5 F (36.4 C) (Oral)  Resp 18  SpO2 98%  LMP 11/16/2012 Physical Exam  Nursing note and vitals reviewed. Constitutional: She is oriented to person, place, and time. She appears well-developed and well-nourished. No distress.  HENT:  Head: Normocephalic and atraumatic.  Eyes:   Normal appearance  Neck: Normal range of motion.  Cardiovascular: Normal rate and regular rhythm.   Pulmonary/Chest: Effort normal and breath sounds normal. No respiratory distress.  Musculoskeletal: Normal range of motion.  Neurological: She is alert and oriented to person, place, and time.  Skin: Skin is warm and dry. No rash noted.  Psychiatric: She has a normal mood and affect. Her behavior is normal.  Pt is pacing.  She stood very close to me and stared while she spoke to me.  She appears anxious.  Her tone is desperate (wants to be discharged home).       ED Course  Procedures (including critical care time) Labs Review Labs Reviewed  CBC WITH DIFFERENTIAL - Abnormal; Notable for the following:    WBC 18.1 (*)    Hemoglobin 15.3 (*)    RDW 15.7 (*)    Platelets 503 (*)    Neutro Abs 12.7 (*)    Monocytes Absolute 1.9 (*)    All other components within normal limits  BASIC METABOLIC PANEL - Abnormal; Notable for the following:    Sodium 131 (*)    Chloride 94 (*)    Glucose, Bld 116 (*)    GFR calc non Af Amer 89 (*)    All other components within normal limits  URINALYSIS, ROUTINE W REFLEX MICROSCOPIC - Abnormal; Notable for the following:    APPearance CLOUDY (*)    Hgb urine dipstick LARGE (*)    All other components within normal limits  URINE RAPID DRUG SCREEN (HOSP PERFORMED) - Abnormal; Notable for the following:    Opiates POSITIVE (*)    Benzodiazepines POSITIVE (*)    Tetrahydrocannabinol POSITIVE (*)    All other components within normal limits  PROTIME-INR - Abnormal; Notable for the following:    Prothrombin Time 35.8 (*)    INR 3.77 (*)    All other components within normal limits  PREGNANCY, URINE  ETHANOL  URINE MICROSCOPIC-ADD ON   Imaging Review No results found.  MDM   1. Insomnia   2. Schizophrenia    42yo F, admitted 9/22-10/3 to [facility] for mood instability secondary to paranoid schizophrenia and PTSD, brought to ED by friend for insomnia  and psychosis.   Pt reports that her hallucinations are typical,  attributes current sx to insomnia secondary to running out of trazodone and she denies SI/HI.  She requests a prescription for trazodone and begs to be discharged home.  Her roommate's cousin, who is with her in ED today, is concerned about bringing her home because she is "not acting her normal self", however, this was the same complaint at time of admission on 9/22, and she has not told me anything to concern me that patient is a harm to herself.  Pt has f/u with her psychiatrist on Wednesday.  D/c'd home w/ 5 trazodone to get her through until her appointment.  Her INR is elevated today.  Advised that she discontinue her coumadin, which she takes for h/o PE, until she sees her PCP for recheck the day after tomorrow.  This appt. Is already scheduled. Her roommate's cousin is willing to let patient stay with her tonight.  Return precautions discussed.    Otilio Miu, PA-C 01/07/13 2046

## 2013-01-07 NOTE — Progress Notes (Signed)
Patient Discharge Instructions:  After Visit Summary (AVS):   Faxed to:  01/07/13 Discharge Summary Note:   Faxed to:  01/07/13 Psychiatric Admission Assessment Note:   Faxed to:  01/07/13 Suicide Risk Assessment - Discharge Assessment:   Faxed to:  01/07/13 Faxed/Sent to the Next Level Care provider:  01/07/13 Faxed to [facility] @ [phone]  Jerelene Redden, 01/07/2013, 2:24 PM

## 2013-01-08 LAB — PROTIME-INR: Prothrombin Time: 32.7 seconds — ABNORMAL HIGH (ref 11.6–15.2)

## 2013-01-08 MED ORDER — WARFARIN SODIUM 1 MG PO TABS
1.0000 mg | ORAL_TABLET | Freq: Once | ORAL | Status: DC
  Filled 2013-01-08: qty 1

## 2013-01-08 MED ORDER — FLUPHENAZINE DECANOATE 25 MG/ML IJ SOLN
25.0000 mg | INTRAMUSCULAR | Status: DC
  Filled 2013-01-08: qty 1

## 2013-01-08 NOTE — ED Provider Notes (Signed)
1. Schizophrenia, paranoid   2. Hallucinations   3. Psychosis    42 -year-old female presents with psychosis. The patient is doing well here. She was evaluated by psych and it is using for discharge. She is at her baseline. INR last night was supratherapeutic at 4.4. Repeat is 23.3. Instructed to followup with up with primary care provider for her INR. Patient is amenable to discharge and is not in any suicidal or homicidal ideation   Dagmar Hait, MD 01/08/13 1428

## 2013-01-08 NOTE — Progress Notes (Signed)
ANTICOAGULATION CONSULT NOTE - Follow up Consult  Pharmacy Consult for Warfarin Indication: H/O Pulmonary Embolism  Allergies  Allergen Reactions  . Darvocet [Propoxyphene-Acetaminophen] Nausea And Vomiting and Other (See Comments)    Shaking     Patient Measurements: Weight: 158 lb (71.668 kg)  Vital Signs: Temp: 98.2 F (36.8 C) (10/09 0557) Temp src: Oral (10/09 0557) BP: 99/53 mmHg (10/09 0557) Pulse Rate: 98 (10/09 0557)  Labs:  Recent Labs  01/06/13 0209  01/06/13 2145 01/07/13 0608 01/08/13 0623  HGB 15.3*  --  14.9  --   --   HCT 43.6  --  41.6  --   --   PLT 503*  --  508*  --   --   LABPROT  --   < > 38.6* 40.4* 32.7*  INR  --   < > 4.16* 4.42* 3.35*  CREATININE 0.80  --  0.80  --   --   < > = values in this interval not displayed.  The CrCl is unknown because both a height and weight (above a minimum accepted value) are required for this calculation.   Medications:  Scheduled:  . benztropine  0.5 mg Oral BID  . diazepam  5 mg Oral BID PC  . fluPHENAZine  5 mg Oral BID  . methocarbamol  250 mg Oral TID  . nicotine  21 mg Transdermal Daily  . Oxcarbazepine  300 mg Oral BID  . polycarbophil  1,250 mg Oral Daily  . traZODone  100 mg Oral Q2000  . Warfarin - Pharmacist Dosing Inpatient   Does not apply q1800   Assessment: 42 y/o female with H/O pulmonary embolism, on chronic warfarin with INR elevated on admission.  Patient with significant psych history and in ED family reports pt was hallucinating.  Pharmacy to manage warfarin therapy while patient in hospital.  Home warfarin dose reportedly 5mg  daily except 7.5mg  Sundays PTA, last dose on 10/7  INR supratherapeutic but improving after holding warfarin dose on 10/8.  Concomitant medications noted:  trazodone (can increase warfarin response), ibuprofen (NSAID with antiplatelet effects).  No bleeding reported.  Goal of Therapy:  INR 2-3   Plan:  Warfarin 1 mg PO x 1 today at 6pm Daily PT/INR  while in hospital.  Elie Goody, PharmD, BCPS Pager: [phone] 01/08/2013  8:21 AM

## 2013-01-08 NOTE — ED Notes (Signed)
Pt ivc'd per md request.

## 2013-01-08 NOTE — ED Notes (Signed)
Pt is constantly pacing in hallway, occasionally making gestures as if locking the doors, seems very suspicious of everyone. Pt has refused all of her medications stating "you know what you have done to it, you know that is all mirage, you are trying to make me sick with your hands."

## 2013-01-08 NOTE — ED Provider Notes (Signed)
Medical screening examination/treatment/procedure(s) were performed by non-physician practitioner and as supervising physician I was immediately available for consultation/collaboration.  Donny Heffern T Drayton Tieu, MD 01/08/13 1358 

## 2013-01-08 NOTE — ED Notes (Signed)
Pt refused discharge vitals signs check.

## 2013-01-08 NOTE — Consult Note (Signed)
[name] Face-to-Face Psychiatry Consult   Reason for Consult:  Evaluation for inpatient treatment psychotic disorder Referring Physician:  EDP  [name] is an 42 y.o. female.  Assessment: AXIS I:  Psychotic Disorder NOS AXIS II:  Deferred AXIS III:   Past Medical History  Diagnosis Date  . Pulmonary emboli   . Schizophrenia   . Cancer   . Uterus cancer   . Anxiety   . Depression    AXIS IV:  other psychosocial or environmental problems and problems with access to health care services AXIS V:  21-30 behavior considerably influenced by delusions or hallucinations OR serious impairment in judgment, communication OR inability to function in almost all areas  Plan:  Recommend psychiatric Inpatient admission when medically cleared.  Subjective:   [name] is a 42 y.o. female.  Patient continues to refuse medication.  Patient states that she is fine and does not need medication.  Patient continues to endorse psychosis but states "I don't have a problem with it.  It might not be normal to you but it is normal to me.  I am not taking no medication I don't need."  Patient states that she is ready to go home it was her boyfriend that brought her in and later came back to pick her up.    HPI Elements:   Location:  [facility] ED . Quality:  Affecting metally and physicially. Severity:  Hallucinations.  Past Psychiatric History: Past Medical History  Diagnosis Date  . Pulmonary emboli   . Schizophrenia   . Cancer   . Uterus cancer   . Anxiety   . Depression     reports that she has been smoking Cigarettes.  She has a 45 pack-year smoking history. She has never used smokeless tobacco. She reports that she uses illicit drugs (Marijuana). She reports that she does not drink alcohol. No family history on file. Family History Substance Abuse: No Family Supports: No Living Arrangements: Alone Can pt return to current living arrangement?: Yes   Allergies:   Allergies   Allergen Reactions  . Darvocet [Propoxyphene-Acetaminophen] Nausea And Vomiting and Other (See Comments)    Shaking     ACT Assessment Complete:  Yes:    Educational Status    Risk to Self: Risk to self Suicidal Ideation: No Suicidal Intent: No Is patient at risk for suicide?: No Suicidal Plan?: No Access to Means: No What has been your use of drugs/alcohol within the last 12 months?: n/a  Previous Attempts/Gestures: No How many times?: 0 Other Self Harm Risks: no Triggers for Past Attempts: None known Intentional Self Injurious Behavior: None Family Suicide History: No Recent stressful life event(s): Conflict (Comment) (ex boyfriend ) Depression: No Substance abuse history and/or treatment for substance abuse?: No  Risk to Others: Risk to Others Homicidal Ideation: No Thoughts of Harm to Others: No Current Homicidal Intent: No Current Homicidal Plan: No Access to Homicidal Means: No Identified Victim: n/a History of harm to others?: No Assessment of Violence: None Noted Violent Behavior Description: none Does patient have access to weapons?: No Criminal Charges Pending?: No Does patient have a court date: No  Abuse:    Prior Inpatient Therapy: Prior Inpatient Therapy Prior Inpatient Therapy: Yes Prior Therapy Dates: 2013, 2014  Prior Therapy Facilty/Provider(s[facility]  Prior Outpatient Therapy: Prior Outpatient Therapy Prior Outpatient Therapy: No Prior Therapy Facilty/Provider(s): [facility]  Reason for Treatment: schizophrenia, paranoid   Additional Information: Additional Information 1:1 In Past 12 Months?:  No CIRT Risk: No Elopement Risk: No Does patient have medical clearance?: Yes                  Objective: Blood pressure 99/53, pulse 98, temperature 98.2 F (36.8 C), temperature source Oral, resp. rate 16, weight 71.668 kg (158 lb), last menstrual period 11/16/2012, SpO2 98.00%.Body mass index is 26.01 kg/(m^2). Results for orders placed  during the hospital encounter of 01/06/13 (from the past 72 hour(s))  GLUCOSE, CAPILLARY     Status: Abnormal   Collection Time    01/06/13  8:03 PM      Result Value Range   Glucose-Capillary 141 (*) 70 - 99 mg/dL  URINALYSIS, ROUTINE W REFLEX MICROSCOPIC     Status: Abnormal   Collection Time    01/06/13  9:32 PM      Result Value Range   Color, Urine RED (*) YELLOW   Comment: BIOCHEMICALS MAY BE AFFECTED BY COLOR   APPearance CLOUDY (*) CLEAR   Specific Gravity, Urine 1.018  1.005 - 1.030   pH 5.5  5.0 - 8.0   Glucose, UA NEGATIVE  NEGATIVE mg/dL   Hgb urine dipstick LARGE (*) NEGATIVE   Bilirubin Urine NEGATIVE  NEGATIVE   Ketones, ur NEGATIVE  NEGATIVE mg/dL   Protein, ur 161 (*) NEGATIVE mg/dL   Urobilinogen, UA 0.2  0.0 - 1.0 mg/dL   Nitrite NEGATIVE  NEGATIVE   Leukocytes, UA MODERATE (*) NEGATIVE  URINE RAPID DRUG SCREEN (HOSP PERFORMED)     Status: Abnormal   Collection Time    01/06/13  9:32 PM      Result Value Range   Opiates POSITIVE (*) NONE DETECTED   Cocaine NONE DETECTED  NONE DETECTED   Benzodiazepines POSITIVE (*) NONE DETECTED   Amphetamines NONE DETECTED  NONE DETECTED   Tetrahydrocannabinol POSITIVE (*) NONE DETECTED   Barbiturates NONE DETECTED  NONE DETECTED   Comment:            DRUG SCREEN FOR MEDICAL PURPOSES     ONLY.  IF CONFIRMATION IS NEEDED     FOR ANY PURPOSE, NOTIFY LAB     WITHIN 5 DAYS.                LOWEST DETECTABLE LIMITS     FOR URINE DRUG SCREEN     Drug Class       Cutoff (ng/mL)     Amphetamine      1000     Barbiturate      200     Benzodiazepine   200     Tricyclics       300     Opiates          300     Cocaine          300     THC              50  URINE MICROSCOPIC-ADD ON     Status: Abnormal   Collection Time    01/06/13  9:32 PM      Result Value Range   Squamous Epithelial / LPF FEW (*) RARE   WBC, UA 0-2  <3 WBC/hpf   RBC / HPF TOO NUMEROUS TO COUNT  <3 RBC/hpf   Bacteria, UA RARE  RARE   Urine-Other  URINALYSIS PERFORMED ON SUPERNATANT     Comment: MICROSCOPIC EXAM PERFORMED ON UNCONCENTRATED URINE  POCT PREGNANCY, URINE     Status: None   Collection Time  01/06/13  9:42 PM      Result Value Range   Preg Test, Ur NEGATIVE  NEGATIVE   Comment:            THE SENSITIVITY OF THIS     METHODOLOGY IS >24 mIU/mL  CBC WITH DIFFERENTIAL     Status: Abnormal   Collection Time    01/06/13  9:45 PM      Result Value Range   WBC 14.4 (*) 4.0 - 10.5 K/uL   RBC 4.60  3.87 - 5.11 MIL/uL   Hemoglobin 14.9  12.0 - 15.0 g/dL   HCT 16.1  09.6 - 04.5 %   MCV 90.4  78.0 - 100.0 fL   MCH 32.4  26.0 - 34.0 pg   MCHC 35.8  30.0 - 36.0 g/dL   RDW 40.9 (*) 81.1 - 91.4 %   Platelets 508 (*) 150 - 400 K/uL   Neutrophils Relative % 59  43 - 77 %   Neutro Abs 8.5 (*) 1.7 - 7.7 K/uL   Lymphocytes Relative 28  12 - 46 %   Lymphs Abs 4.0  0.7 - 4.0 K/uL   Monocytes Relative 11  3 - 12 %   Monocytes Absolute 1.5 (*) 0.1 - 1.0 K/uL   Eosinophils Relative 3  0 - 5 %   Eosinophils Absolute 0.4  0.0 - 0.7 K/uL   Basophils Relative 0  0 - 1 %   Basophils Absolute 0.1  0.0 - 0.1 K/uL  COMPREHENSIVE METABOLIC PANEL     Status: Abnormal   Collection Time    01/06/13  9:45 PM      Result Value Range   Sodium 134 (*) 135 - 145 mEq/L   Potassium 3.1 (*) 3.5 - 5.1 mEq/L   Chloride 96  96 - 112 mEq/L   CO2 24  19 - 32 mEq/L   Glucose, Bld 134 (*) 70 - 99 mg/dL   BUN 7  6 - 23 mg/dL   Creatinine, Ser 7.82  0.50 - 1.10 mg/dL   Calcium 9.2  8.4 - 95.6 mg/dL   Total Protein 7.6  6.0 - 8.3 g/dL   Albumin 3.8  3.5 - 5.2 g/dL   AST 42 (*) 0 - 37 U/L   ALT 22  0 - 35 U/L   Alkaline Phosphatase 95  39 - 117 U/L   Total Bilirubin 0.2 (*) 0.3 - 1.2 mg/dL   GFR calc non Af Amer 89 (*) >90 mL/min   GFR calc Af Amer >90  >90 mL/min   Comment: (NOTE)     The eGFR has been calculated using the CKD EPI equation.     This calculation has not been validated in all clinical situations.     eGFR's persistently <90 mL/min  signify possible Chronic Kidney     Disease.  PROTIME-INR     Status: Abnormal   Collection Time    01/06/13  9:45 PM      Result Value Range   Prothrombin Time 38.6 (*) 11.6 - 15.2 seconds   INR 4.16 (*) 0.00 - 1.49  ETHANOL     Status: None   Collection Time    01/06/13  9:45 PM      Result Value Range   Alcohol, Ethyl (B) <11  0 - 11 mg/dL   Comment:            LOWEST DETECTABLE LIMIT FOR     SERUM ALCOHOL IS 11 mg/dL  FOR MEDICAL PURPOSES ONLY  PROTIME-INR     Status: Abnormal   Collection Time    01/07/13  6:08 AM      Result Value Range   Prothrombin Time 40.4 (*) 11.6 - 15.2 seconds   INR 4.42 (*) 0.00 - 1.49  PROTIME-INR     Status: Abnormal   Collection Time    01/08/13  6:23 AM      Result Value Range   Prothrombin Time 32.7 (*) 11.6 - 15.2 seconds   INR 3.35 (*) 0.00 - 1.49     Current Facility-Administered Medications  Medication Dose Route Frequency Provider Last Rate Last Dose  . alum & mag hydroxide-simeth (MAALOX/MYLANTA) 200-200-20 MG/5ML suspension 30 mL  30 mL Oral PRN Angus Seller, PA-C      . benztropine (COGENTIN) tablet 0.5 mg  0.5 mg Oral BID Phill Mutter Dammen, PA-C   1 mg at 01/07/13 2147  . cyclobenzaprine (FLEXERIL) tablet 5 mg  5 mg Oral TID PRN Angus Seller, PA-C   10 mg at 01/08/13 0138  . diazepam (VALIUM) tablet 5 mg  5 mg Oral BID PC Phill Mutter Dammen, PA-C   5 mg at 01/07/13 1820  . fluPHENAZine (PROLIXIN) tablet 5 mg  5 mg Oral BID Phill Mutter Dammen, PA-C   5 mg at 01/07/13 2149  . fluPHENAZine decanoate (PROLIXIN) injection 25 mg  25 mg Intramuscular Q28 days Shuvon Rankin, NP      . ibuprofen (ADVIL,MOTRIN) tablet 600 mg  600 mg Oral Q8H PRN Angus Seller, PA-C      . LORazepam (ATIVAN) tablet 1 mg  1 mg Oral Q8H PRN Angus Seller, PA-C   1 mg at 01/07/13 2148  . methocarbamol (ROBAXIN) tablet 250 mg  250 mg Oral TID Angus Seller, PA-C   500 mg at 01/07/13 2148  . nicotine (NICODERM CQ - dosed in mg/24 hours) patch 21 mg  21 mg  Transdermal Daily Phill Mutter Dammen, PA-C   21 mg at 01/07/13 0911  . ondansetron (ZOFRAN) tablet 4 mg  4 mg Oral Q8H PRN Angus Seller, PA-C      . Oxcarbazepine (TRILEPTAL) tablet 300 mg  300 mg Oral BID Phill Mutter Dammen, PA-C   300 mg at 01/07/13 2149  . polycarbophil (FIBERCON) tablet 1,250 mg  1,250 mg Oral Daily Phill Mutter Dammen, PA-C   1,250 mg at 01/07/13 1005  . traZODone (DESYREL) tablet 100 mg  100 mg Oral Q2000 Phill Mutter Dammen, PA-C   100 mg at 01/07/13 2148  . warfarin (COUMADIN) tablet 1 mg  1 mg Oral ONCE-1800 Randall K Absher, RPH      . Warfarin - Pharmacist Dosing Inpatient   Does not apply q1800 Leann Trefz Poindexter, RPH      . zolpidem (AMBIEN) tablet 5 mg  5 mg Oral QHS PRN Angus Seller, PA-C       Current Outpatient Prescriptions  Medication Sig Dispense Refill  . benztropine (COGENTIN) 0.5 MG tablet Take 1 tablet (0.5 mg total) by mouth 2 (two) times daily. For prevention of drug related involuntary movements  60 tablet  0  . cyclobenzaprine (FLEXERIL) 5 MG tablet Take 5 mg by mouth 3 (three) times daily as needed for muscle spasms (muscle spsasms).      . diazepam (VALIUM) 5 MG tablet Take 1 tablet (5 mg total) by mouth 2 (two) times daily after a meal. For anxiety/muscle spasms  7 tablet  0  .  fluPHENAZine (PROLIXIN) 5 MG tablet Take 1 tablet (5 mg total) by mouth 2 (two) times daily. For mood control  60 tablet  0  . methocarbamol (ROBAXIN) 500 MG tablet Take 0.5 tablets (250 mg total) by mouth 3 (three) times daily. For muscle spasms  90 tablet  0  . Oxcarbazepine (TRILEPTAL) 300 MG tablet Take 1 tablet (300 mg total) by mouth 2 (two) times daily. For mood stabilization  60 tablet  0  . polycarbophil (FIBERCON) 625 MG tablet Take 2 tablets (1,250 mg total) by mouth daily. (This medicine may be purchased from over the counter at yr local pharmacy): For constipation      . traZODone (DESYREL) 100 MG tablet Take 1 tablet (100 mg total) by mouth daily at 8 pm. For sleep  5  tablet  0  . warfarin (COUMADIN) 5 MG tablet Take 5-7.5 mg by mouth daily. Take 1 tablet all days except Sunday take 1 & 1/2 tablet        Psychiatric Specialty Exam:     Blood pressure 99/53, pulse 98, temperature 98.2 F (36.8 C), temperature source Oral, resp. rate 16, weight 71.668 kg (158 lb), last menstrual period 11/16/2012, SpO2 98.00%.Body mass index is 26.01 kg/(m^2).  General Appearance: Bizarre  Eye Contact::  Good  Speech:  Clear and Coherent and Pressured  Volume:  Normal  Mood:  Anxious and Depressed  Affect:  Depressed and Restricted  Thought Process:  Circumstantial and Disorganized  Orientation:  Full (Time, Place, and Person)  Thought Content:  Paranoid Ideation and Rumination  Suicidal Thoughts:  No  Homicidal Thoughts:  No  Memory:  Immediate;   Fair Recent;   Fair Remote;   Fair  Judgement:  Impaired  Insight:  Lacking  Psychomotor Activity:  Normal  Concentration:  Poor  Recall:  Fair  Akathisia:  No  Handed:  Right  AIMS (if indicated):     Assets:  Housing Social Support  Sleep:      Face to face interview and consult with Dr. Ladona Ridgel  Treatment Plan Summary: Discharge  Patient is not a danger to self or other. Patient refusing medication and stating that she does not need.  Patient lives with boyfriend who is comfortable with patient coming home.  Patient is not overtly psychotic.  Patient is able to carry on a conversation coherently with out any confusion.   Disposition:  Discharge home to follow up with primary.  Give patient resources for outpatient resources.    Rankin, Shuvon, FNP-BC 01/08/2013 1:56 PM

## 2013-01-09 NOTE — Consult Note (Signed)
Note reviewed and agreed with  

## 2013-01-16 NOTE — ED Provider Notes (Signed)
Medical screening examination/treatment/procedure(s) were performed by non-physician practitioner and as supervising physician I was immediately available for consultation/collaboration.  Toy Baker, MD 01/16/13 [phone]

## 2013-01-25 ENCOUNTER — Observation Stay (HOSPITAL_COMMUNITY)
Admission: EM | Admit: 2013-01-25 | Discharge: 2013-01-27 | Payer: Medicaid Other | Attending: Internal Medicine | Admitting: Internal Medicine

## 2013-01-25 ENCOUNTER — Encounter (HOSPITAL_COMMUNITY): Payer: Self-pay | Admitting: Emergency Medicine

## 2013-01-25 DIAGNOSIS — E876 Hypokalemia: Secondary | ICD-10-CM | POA: Insufficient documentation

## 2013-01-25 DIAGNOSIS — R0902 Hypoxemia: Secondary | ICD-10-CM

## 2013-01-25 DIAGNOSIS — I38 Endocarditis, valve unspecified: Secondary | ICD-10-CM

## 2013-01-25 DIAGNOSIS — R000 Tachycardia, unspecified: Secondary | ICD-10-CM

## 2013-01-25 DIAGNOSIS — F3289 Other specified depressive episodes: Secondary | ICD-10-CM | POA: Insufficient documentation

## 2013-01-25 DIAGNOSIS — F329 Major depressive disorder, single episode, unspecified: Secondary | ICD-10-CM | POA: Insufficient documentation

## 2013-01-25 DIAGNOSIS — F200 Paranoid schizophrenia: Secondary | ICD-10-CM | POA: Insufficient documentation

## 2013-01-25 DIAGNOSIS — D72829 Elevated white blood cell count, unspecified: Secondary | ICD-10-CM

## 2013-01-25 DIAGNOSIS — I2699 Other pulmonary embolism without acute cor pulmonale: Secondary | ICD-10-CM | POA: Insufficient documentation

## 2013-01-25 DIAGNOSIS — F29 Unspecified psychosis not due to a substance or known physiological condition: Principal | ICD-10-CM | POA: Insufficient documentation

## 2013-01-25 DIAGNOSIS — F4310 Post-traumatic stress disorder, unspecified: Secondary | ICD-10-CM | POA: Insufficient documentation

## 2013-01-25 DIAGNOSIS — N390 Urinary tract infection, site not specified: Secondary | ICD-10-CM

## 2013-01-25 DIAGNOSIS — F1210 Cannabis abuse, uncomplicated: Secondary | ICD-10-CM

## 2013-01-25 DIAGNOSIS — I7300 Raynaud's syndrome without gangrene: Secondary | ICD-10-CM

## 2013-01-25 DIAGNOSIS — I517 Cardiomegaly: Secondary | ICD-10-CM | POA: Insufficient documentation

## 2013-01-25 DIAGNOSIS — Z7901 Long term (current) use of anticoagulants: Secondary | ICD-10-CM | POA: Insufficient documentation

## 2013-01-25 DIAGNOSIS — K769 Liver disease, unspecified: Secondary | ICD-10-CM

## 2013-01-25 DIAGNOSIS — R0602 Shortness of breath: Secondary | ICD-10-CM

## 2013-01-25 DIAGNOSIS — F17200 Nicotine dependence, unspecified, uncomplicated: Secondary | ICD-10-CM | POA: Insufficient documentation

## 2013-01-25 DIAGNOSIS — R42 Dizziness and giddiness: Secondary | ICD-10-CM

## 2013-01-25 DIAGNOSIS — Z720 Tobacco use: Secondary | ICD-10-CM

## 2013-01-25 DIAGNOSIS — R319 Hematuria, unspecified: Secondary | ICD-10-CM | POA: Insufficient documentation

## 2013-01-25 DIAGNOSIS — I8290 Acute embolism and thrombosis of unspecified vein: Secondary | ICD-10-CM

## 2013-01-25 DIAGNOSIS — F411 Generalized anxiety disorder: Secondary | ICD-10-CM | POA: Insufficient documentation

## 2013-01-25 DIAGNOSIS — N912 Amenorrhea, unspecified: Secondary | ICD-10-CM | POA: Insufficient documentation

## 2013-01-25 DIAGNOSIS — J189 Pneumonia, unspecified organism: Secondary | ICD-10-CM

## 2013-01-25 DIAGNOSIS — F209 Schizophrenia, unspecified: Secondary | ICD-10-CM

## 2013-01-25 HISTORY — DX: Post-traumatic stress disorder, unspecified: F43.10

## 2013-01-25 LAB — RAPID URINE DRUG SCREEN, HOSP PERFORMED
Barbiturates: NOT DETECTED
Opiates: POSITIVE — AB

## 2013-01-25 LAB — COMPREHENSIVE METABOLIC PANEL
ALT: 11 U/L (ref 0–35)
Alkaline Phosphatase: 103 U/L (ref 39–117)
BUN: 7 mg/dL (ref 6–23)
CO2: 26 mEq/L (ref 19–32)
Calcium: 9.2 mg/dL (ref 8.4–10.5)
GFR calc Af Amer: >90 mL/min (ref >90)
GFR calc non Af Amer: >90 mL/min (ref >90)
Glucose, Bld: 128 mg/dL — ABNORMAL HIGH (ref 70–99)
Potassium: 3.2 mEq/L — ABNORMAL LOW (ref 3.5–5.1)
Sodium: 138 mEq/L (ref 135–145)
Total Bilirubin: 0.5 mg/dL (ref 0.3–1.2)

## 2013-01-25 LAB — URINALYSIS, ROUTINE W REFLEX MICROSCOPIC
Glucose, UA: NEGATIVE mg/dL
Nitrite: NEGATIVE
Urobilinogen, UA: 1 mg/dL (ref 0.0–1.0)

## 2013-01-25 LAB — CBC WITH DIFFERENTIAL/PLATELET
Basophils Absolute: 0 10*3/uL (ref 0.0–0.1)
Eosinophils Absolute: 0.2 10*3/uL (ref 0.0–0.7)
HCT: 39.1 % (ref 36.0–46.0)
Hemoglobin: 13.8 g/dL (ref 12.0–15.0)
Lymphocytes Relative: 25 % (ref 12–46)
Lymphs Abs: 2.5 10*3/uL (ref 0.7–4.0)
Monocytes Absolute: 1.3 10*3/uL — ABNORMAL HIGH (ref 0.1–1.0)
Monocytes Relative: 12 % (ref 3–12)
Neutro Abs: 6.3 10*3/uL (ref 1.7–7.7)
RBC: 4.34 MIL/uL (ref 3.87–5.11)
WBC: 10.3 10*3/uL (ref 4.0–10.5)

## 2013-01-25 LAB — ACETAMINOPHEN LEVEL: Acetaminophen (Tylenol), Serum: <15 ug/mL (ref 10–30)

## 2013-01-25 LAB — PROTIME-INR
INR: 1.23 (ref 0.00–1.49)
Prothrombin Time: 15.2 seconds (ref 11.6–15.2)

## 2013-01-25 LAB — POCT PREGNANCY, URINE: Preg Test, Ur: NEGATIVE

## 2013-01-25 MED ORDER — SODIUM CHLORIDE 0.9 % IV SOLN
INTRAVENOUS | Status: DC
  Administered 2013-01-25 – 2013-01-26 (×2): via INTRAVENOUS

## 2013-01-25 MED ORDER — OXCARBAZEPINE 300 MG PO TABS: 300.0000 mg | ORAL_TABLET | Freq: Two times a day (BID) | ORAL | Status: DC

## 2013-01-25 MED ORDER — ENOXAPARIN SODIUM 80 MG/0.8ML ~~LOC~~ SOLN
70.0000 mg | Freq: Two times a day (BID) | SUBCUTANEOUS | Status: DC
  Administered 2013-01-25 – 2013-01-26 (×2): 70 mg via SUBCUTANEOUS
  Filled 2013-01-25 (×5): qty 0.8

## 2013-01-25 MED ORDER — ONDANSETRON HCL 4 MG/2ML IJ SOLN: 4.0000 mg | Freq: Four times a day (QID) | INTRAMUSCULAR | Status: DC | PRN

## 2013-01-25 MED ORDER — DIAZEPAM 5 MG PO TABS
5.0000 mg | ORAL_TABLET | Freq: Three times a day (TID) | ORAL | Status: DC
  Administered 2013-01-25 – 2013-01-27 (×6): 5 mg via ORAL
  Filled 2013-01-25 (×6): qty 1

## 2013-01-25 MED ORDER — ONDANSETRON HCL 4 MG PO TABS: 4.0000 mg | ORAL_TABLET | Freq: Four times a day (QID) | ORAL | Status: DC | PRN

## 2013-01-25 MED ORDER — WARFARIN SODIUM 5 MG PO TABS
5.0000 mg | ORAL_TABLET | Freq: Once | ORAL | Status: AC
  Administered 2013-01-25: 5 mg via ORAL
  Filled 2013-01-25 (×2): qty 1

## 2013-01-25 MED ORDER — HYDROCODONE-ACETAMINOPHEN 10-325 MG PO TABS
1.0000 | ORAL_TABLET | Freq: Four times a day (QID) | ORAL | Status: DC | PRN
  Administered 2013-01-26 – 2013-01-27 (×4): 1 via ORAL
  Filled 2013-01-25 (×4): qty 1

## 2013-01-25 MED ORDER — POTASSIUM CHLORIDE CRYS ER 20 MEQ PO TBCR
40.0000 meq | EXTENDED_RELEASE_TABLET | Freq: Once | ORAL | Status: AC
  Administered 2013-01-25: 40 meq via ORAL
  Filled 2013-01-25: qty 2

## 2013-01-25 MED ORDER — CYCLOBENZAPRINE HCL 10 MG PO TABS: 5.0000 mg | ORAL_TABLET | Freq: Three times a day (TID) | ORAL | Status: DC | PRN

## 2013-01-25 MED ORDER — HYDROCODONE-ACETAMINOPHEN 10-325 MG PO TABS: 1.0000 | ORAL_TABLET | Freq: Three times a day (TID) | ORAL | Status: DC

## 2013-01-25 MED ORDER — TRAZODONE HCL 100 MG PO TABS: 100.0000 mg | ORAL_TABLET | Freq: Every day | ORAL | Status: DC

## 2013-01-25 MED ORDER — NICOTINE 21 MG/24HR TD PT24
21.0000 mg | MEDICATED_PATCH | Freq: Every day | TRANSDERMAL | Status: DC
  Administered 2013-01-25 – 2013-01-27 (×3): 21 mg via TRANSDERMAL
  Filled 2013-01-25 (×3): qty 1

## 2013-01-25 MED ORDER — WARFARIN - PHARMACIST DOSING INPATIENT: Freq: Every day | Status: DC

## 2013-01-25 MED ORDER — HYDROMORPHONE HCL PF 1 MG/ML IJ SOLN
1.0000 mg | INTRAMUSCULAR | Status: DC | PRN
  Administered 2013-01-26: 1 mg via INTRAVENOUS
  Filled 2013-01-25: qty 1

## 2013-01-25 NOTE — ED Notes (Signed)
Pt changed into blue paper scrubs. Belongings placed in bags. Pt wanded by security.

## 2013-01-25 NOTE — Progress Notes (Signed)
5:10 PM  Attempted to call report for transfer to 5W10.   5:21 PM Report received from [place], [place].

## 2013-01-25 NOTE — H&P (Signed)
[facility] History and Physical  [name]:[id number] DOB: Nov 21, 1970 DOA: 01/25/2013  Referring physician: ED physician PCP: Provider Not In System   Chief Complaint: hallucinating, sub therapeutic INR, needs clearance for psych   HPI:  Pt is 42 yo female with schizophrenia and depression, PE on Coumadin who presented to [facility] ED after found to be hallucinating. Please note that pt is unable to provide history and most of the details obtained from available records. Pt currently denies any problems and tells she wants to go home and does not know why she is here. Of note she is oriented to name only at the time of admission. In ED, pt noted to be hallucinating and INR found to be sub therapeutic. [facility] asked to admit for obs and electrolyte supplementation. D/C psych once medically cleared.   Assessment and Plan: Altered mental status with hallucinations  - unclear etiology, ? Psychiatric etiology - admit to medical floor - supplement electrolytes - psych consult in AM and plan on d/c to [facility] once medically stable Sub therapeutic INR - on coumadin for PE - possible non compliance - Lovenox and Couamdin per pharmacy  Hypokalemia - will supplement orally and repeat BMP in AM   Code Status: Full Family Communication: No family at bedside  Disposition Plan: Admit to medical floor    Review of Systems:  Unable to obtain due to altered mental status     Past Medical History  Diagnosis Date  . Pulmonary emboli   . Schizophrenia   . Cancer   . Uterus cancer   . Anxiety   . Depression   . PTSD (post-traumatic stress disorder)     Past Surgical History  Procedure Laterality Date  . No past surgeries    . Uterine fibroid surgery      uterine cancer    Social History:  reports that she has been smoking Cigarettes.  She has a 45 pack-year smoking history. She has never used smokeless tobacco. She reports that she uses illicit drugs. She reports that she does not  drink alcohol.  Allergies  Allergen Reactions  . Darvocet [Propoxyphene-Acetaminophen] Nausea And Vomiting and Other (See Comments)    Shaking     No family history on file.  Prior to Admission medications   Medication Sig Start Date End Date Taking? Authorizing Provider  benztropine (COGENTIN) 0.5 MG tablet Take 0.5 mg by mouth 2 (two) times daily.   Yes Historical Provider, MD  cyclobenzaprine (FLEXERIL) 5 MG tablet Take 5 mg by mouth 3 (three) times daily as needed for muscle spasms (muscle spsasms).   Yes Historical Provider, MD  diazepam (VALIUM) 5 MG tablet Take 5 mg by mouth 3 (three) times daily.    Yes Historical Provider, MD  HYDROcodone-acetaminophen (NORCO) 10-325 MG per tablet Take 1 tablet by mouth 4 (four) times daily.   Yes Historical Provider, MD  methocarbamol (ROBAXIN) 500 MG tablet Take 250 mg by mouth 3 (three) times daily.   Yes Historical Provider, MD  Oxcarbazepine (TRILEPTAL) 300 MG tablet Take 300 mg by mouth 2 (two) times daily.   Yes Historical Provider, MD  traZODone (DESYREL) 100 MG tablet Take 100 mg by mouth at bedtime.   Yes Historical Provider, MD  warfarin (COUMADIN) 5 MG tablet Take 5-7.5 mg by mouth daily. Take 1 tablet (5mg ) all days except Sunday take 1 & 1/2 tablet (7.5mg ) per patient; Pharmacy reports 1 tablet (5mg ) daily except 1/2 tablet (2.5mg ) on Sunday   Yes Historical  Provider, MD    Physical Exam: Filed Vitals:   01/25/13 1318 01/25/13 1618  BP: 124/80 96/63  Pulse: 100 86  Temp: 98.1 F (36.7 C) 97.2 F (36.2 C)  TempSrc: Oral Oral  Resp: 18 18  Weight: 68.629 kg (151 lb 4.8 oz)   SpO2: 97% 100%    Physical Exam  Constitutional: not in acute distress  HENT: Normocephalic. External right and left ear normal. Dry  MM Eyes: Conjunctivae and EOM are normal. PERRLA, no scleral icterus.  Neck: Normal ROM. Neck supple. No JVD. No tracheal deviation. No thyromegaly.  CVS: RRR, S1/S2 +, no murmurs, no gallops, no carotid bruit.   Pulmonary: Effort and breath sounds normal, no stridor, diminished breath sounds at bases  Abdominal: Soft. BS +,  no distension, tenderness, rebound or guarding.  Musculoskeletal: Normal range of motion. No edema and no tenderness.  Lymphadenopathy: No lymphadenopathy noted, cervical, inguinal. Neuro: Alert. Hallucinating but overall non focal  Skin: Skin is warm and dry. No rash noted. Not diaphoretic. No erythema. No pallor.  Psychiatric: Hallucinating   Labs on Admission:  Basic Metabolic Panel:  Recent Labs Lab 01/25/13 1350  NA 138  K 3.2*  CL 100  CO2 26  GLUCOSE 128*  BUN 7  CREATININE 0.75  CALCIUM 9.2   Liver Function Tests:  Recent Labs Lab 01/25/13 1350  AST 23  ALT 11  ALKPHOS 103  BILITOT 0.5  PROT 6.9  ALBUMIN 3.4*   CBC:  Recent Labs Lab 01/25/13 1350  WBC 10.3  NEUTROABS 6.3  HGB 13.8  HCT 39.1  MCV 90.1  PLT 404*   Radiological Exams on Admission: No results found.  EKG: Normal sinus rhythm, no ST/T wave changes  Debbora Presto, MD  [facility] Pager [phone]  If 7PM-7AM, please contact night-coverage [email or web address] Password [email or web address] 01/25/2013, 4:40 PM

## 2013-01-25 NOTE — Progress Notes (Addendum)
ANTICOAGULATION CONSULT NOTE - Initial Consult  Pharmacy Consult for Lovenox Indication: pulmonary embolus  Allergies  Allergen Reactions  . Darvocet [Propoxyphene-Acetaminophen] Nausea And Vomiting and Other (See Comments)    Shaking     Patient Measurements: Weight: 151 lb 4.8 oz (68.629 kg) Heparin Dosing Weight:  68.6 kg  Vital Signs: Temp: 98.1 F (36.7 C) (10/26 1318) Temp src: Oral (10/26 1318) BP: 124/80 mmHg (10/26 1318) Pulse Rate: 100 (10/26 1318)  Labs:  Recent Labs  01/25/13 1350 01/25/13 1415  HGB 13.8  --   HCT 39.1  --   PLT 404*  --   LABPROT  --  15.2  INR  --  1.23  CREATININE 0.75  --     The CrCl is unknown because both a height and weight (above a minimum accepted value) are required for this calculation.   Medical History: Past Medical History  Diagnosis Date  . Pulmonary emboli   . Schizophrenia   . Cancer   . Uterus cancer   . Anxiety   . Depression     Medications: see med rec  Assessment: 42 y/o schizophrenic female presents with c/o "I am here to be evaluated, all of my organs fell out of my body." Patient has a h/o PE on Coumadin with INR 1.23 but pt know to be noncompliant with her medications. CBC WNL and no c/o bleeding.  Goal of Therapy:  Anti-Xa level 0.6-1.2 units/ml 4hrs after LMWH dose given Monitor platelets by anticoagulation protocol: Yes   Plan:  Lovenox 70mg  SQ BID CBC q72h on Lovenox   Crystal S. Merilynn Finland, PharmD, BCPS Clinical Staff Pharmacist Pager [phone]  Misty Stanley Stillinger 01/25/2013,3:41 PM  Addendum: Pharmacy is also consulted to dose coumadin. INR 1.23 today. Hgb 13.8, plt 404. Pt. said  maybe she has taken her coumadin today.   PTA dose: Per Pt - 5mg  all days except Sunday take 7.5mg , per patient; Pharmacy reports - 5mg  daily except 2.5mg  on Sunday  - Coumadin 5mg  po x1 - f/u daily PT/INR

## 2013-01-25 NOTE — ED Notes (Addendum)
Pt presents to department for psychiatric evaluation. Pt states "I am here to be evaluated, all of my organs fell out of my body." denies SI/HI at the time. Also states she has psychic powers and requesting a secure room in the hospital. Denies pain. Pt is anxious upon arrival to ED. Pt is talking to herself and hallucinating in triage. No attempts to harm self at the present.

## 2013-01-25 NOTE — ED Provider Notes (Signed)
CSN: [id number]     Arrival date & time 01/25/13  1306 History   First MD Initiated Contact with Patient 01/25/13 1345     Chief Complaint  Patient presents with  . Medical Clearance   level V caveat, patient extremely vague historian. Hallucinating. (Consider location/radiation/quality/duration/timing/severity/associated sxs/prior Treatment) HPI Patient complains of "I have no abdominal organs." There are psychics coming through the walls. She will not elaborate on how long she has been symptomatic. She denies noncompliance with her medications. Denies wishing to harm herself or others. No treatment prior to coming here. Past Medical History  Diagnosis Date  . Pulmonary emboli   . Schizophrenia   . Cancer   . Uterus cancer   . Anxiety   . Depression    Past Surgical History  Procedure Laterality Date  . No past surgeries    . Uterine fibroid surgery      uterine cancer   No family history on file. History  Substance Use Topics  . Smoking status: Current Every Day Smoker -- 3.00 packs/day for 15 years    Types: Cigarettes  . Smokeless tobacco: Never Used  . Alcohol Use: No   OB History   Grav Para Term Preterm Abortions TAB SAB Ect Mult Living                 Review of Systems  Unable to perform ROS: Psychiatric disorder    Allergies  Darvocet  Home Medications   Current Outpatient Rx  Name  Route  Sig  Dispense  Refill  . cyclobenzaprine (FLEXERIL) 5 MG tablet   Oral   Take 5 mg by mouth 3 (three) times daily as needed for muscle spasms (muscle spsasms).         . diazepam (VALIUM) 5 MG tablet   Oral   Take 5 mg by mouth 2 (two) times daily.         Marland Kitchen warfarin (COUMADIN) 5 MG tablet   Oral   Take 5-7.5 mg by mouth daily. Take 1 tablet all days except Sunday take 1 & 1/2 tablet         . benztropine (COGENTIN) 0.5 MG tablet   Oral   Take 1 tablet (0.5 mg total) by mouth 2 (two) times daily. For prevention of drug related involuntary movements  60 tablet   0   . fluPHENAZine (PROLIXIN) 5 MG tablet   Oral   Take 1 tablet (5 mg total) by mouth 2 (two) times daily. For mood control   60 tablet   0   . methocarbamol (ROBAXIN) 500 MG tablet   Oral   Take 0.5 tablets (250 mg total) by mouth 3 (three) times daily. For muscle spasms   90 tablet   0   . Oxcarbazepine (TRILEPTAL) 300 MG tablet   Oral   Take 1 tablet (300 mg total) by mouth 2 (two) times daily. For mood stabilization   60 tablet   0   . polycarbophil (FIBERCON) 625 MG tablet   Oral   Take 2 tablets (1,250 mg total) by mouth daily. (This medicine may be purchased from over the counter at yr local pharmacy): For constipation         . traZODone (DESYREL) 100 MG tablet   Oral   Take 1 tablet (100 mg total) by mouth daily at 8 pm. For sleep   5 tablet   0    BP 124/80  Pulse 100  Temp(Src) 98.1 F (36.7 C) (Oral)  Resp 18  Wt 151 lb 4.8 oz (68.629 kg)  BMI 24.91 kg/m2  SpO2 97% Physical Exam  Nursing note and vitals reviewed. Constitutional: She is oriented to person, place, and time. She appears well-developed and well-nourished.  HENT:  Head: Normocephalic and atraumatic.  Eyes: Conjunctivae are normal. Pupils are equal, round, and reactive to light.  Neck: Neck supple. No tracheal deviation present. No thyromegaly present.  Cardiovascular: Normal rate and regular rhythm.   No murmur heard. Pulmonary/Chest: Effort normal and breath sounds normal.  Abdominal: Soft. Bowel sounds are normal. She exhibits no distension. There is no tenderness.  Musculoskeletal: Normal range of motion. She exhibits no edema and no tenderness.  Neurological: She is alert and oriented to person, place, and time. No cranial nerve deficit. Coordination normal.  Gait normal  Skin: Skin is warm and dry. No rash noted.  Psychiatric: She has a normal mood and affect.  Anxious, actively hallucinating. Staying "there are people sitting next to me    ED Course  Procedures  (including critical care time) Labs Review Labs Reviewed  URINE RAPID DRUG SCREEN (HOSP PERFORMED)  CBC WITH DIFFERENTIAL  COMPREHENSIVE METABOLIC PANEL  ACETAMINOPHEN LEVEL  SALICYLATE LEVEL  URINALYSIS, ROUTINE W REFLEX MICROSCOPIC   Imaging Review No results found.  EKG Interpretation     Ventricular Rate:  95 PR Interval:  156 QRS Duration: 101 QT Interval:  383 QTC Calculation: 481 R Axis:   50 Text Interpretation:  Sinus rhythm LAE, consider biatrial enlargement RSR' in V1 or V2, right VCD or RVH No significant change since last tracing           Results for orders placed during the hospital encounter of 01/25/13  URINE RAPID DRUG SCREEN (HOSP PERFORMED)      Result Value Range   Opiates POSITIVE (*) NONE DETECTED   Cocaine NONE DETECTED  NONE DETECTED   Benzodiazepines POSITIVE (*) NONE DETECTED   Amphetamines NONE DETECTED  NONE DETECTED   Tetrahydrocannabinol POSITIVE (*) NONE DETECTED   Barbiturates NONE DETECTED  NONE DETECTED  CBC WITH DIFFERENTIAL      Result Value Range   WBC 10.3  4.0 - 10.5 K/uL   RBC 4.34  3.87 - 5.11 MIL/uL   Hemoglobin 13.8  12.0 - 15.0 g/dL   HCT 91.4  78.2 - 95.6 %   MCV 90.1  78.0 - 100.0 fL   MCH 31.8  26.0 - 34.0 pg   MCHC 35.3  30.0 - 36.0 g/dL   RDW 21.3 (*) 08.6 - 57.8 %   Platelets 404 (*) 150 - 400 K/uL   Neutrophils Relative % 61  43 - 77 %   Neutro Abs 6.3  1.7 - 7.7 K/uL   Lymphocytes Relative 25  12 - 46 %   Lymphs Abs 2.5  0.7 - 4.0 K/uL   Monocytes Relative 12  3 - 12 %   Monocytes Absolute 1.3 (*) 0.1 - 1.0 K/uL   Eosinophils Relative 2  0 - 5 %   Eosinophils Absolute 0.2  0.0 - 0.7 K/uL   Basophils Relative 0  0 - 1 %   Basophils Absolute 0.0  0.0 - 0.1 K/uL  COMPREHENSIVE METABOLIC PANEL      Result Value Range   Sodium 138  135 - 145 mEq/L   Potassium 3.2 (*) 3.5 - 5.1 mEq/L   Chloride 100  96 - 112 mEq/L   CO2 26  19 - 32 mEq/L   Glucose, Bld 128 (*)  70 - 99 mg/dL   BUN 7  6 - 23 mg/dL    Creatinine, Ser 6.57  0.50 - 1.10 mg/dL   Calcium 9.2  8.4 - 84.6 mg/dL   Total Protein 6.9  6.0 - 8.3 g/dL   Albumin 3.4 (*) 3.5 - 5.2 g/dL   AST 23  0 - 37 U/L   ALT 11  0 - 35 U/L   Alkaline Phosphatase 103  39 - 117 U/L   Total Bilirubin 0.5  0.3 - 1.2 mg/dL   GFR calc non Af Amer >90  >90 mL/min   GFR calc Af Amer >90  >90 mL/min  ACETAMINOPHEN LEVEL      Result Value Range   Acetaminophen (Tylenol), Serum <15.0  10 - 30 ug/mL  SALICYLATE LEVEL      Result Value Range   Salicylate Lvl <2.0 (*) 2.8 - 20.0 mg/dL  URINALYSIS, ROUTINE W REFLEX MICROSCOPIC      Result Value Range   Color, Urine RED (*) YELLOW   APPearance CLOUDY (*) CLEAR   Specific Gravity, Urine 1.022  1.005 - 1.030   pH 5.5  5.0 - 8.0   Glucose, UA NEGATIVE  NEGATIVE mg/dL   Hgb urine dipstick LARGE (*) NEGATIVE   Bilirubin Urine MODERATE (*) NEGATIVE   Ketones, ur 15 (*) NEGATIVE mg/dL   Protein, ur 30 (*) NEGATIVE mg/dL   Urobilinogen, UA 1.0  0.0 - 1.0 mg/dL   Nitrite NEGATIVE  NEGATIVE   Leukocytes, UA MODERATE (*) NEGATIVE  PROTIME-INR      Result Value Range   Prothrombin Time 15.2  11.6 - 15.2 seconds   INR 1.23  0.00 - 1.49  URINE MICROSCOPIC-ADD ON      Result Value Range   Squamous Epithelial / LPF RARE  RARE   WBC, UA 11-20  <3 WBC/hpf   RBC / HPF TOO NUMEROUS TO COUNT  <3 RBC/hpf  POCT PREGNANCY, URINE      Result Value Range   Preg Test, Ur NEGATIVE  NEGATIVE   No results found.  Patient was declined by behavioral health do prior aggressive behavior. Hospitalist service was consult the in light of her hematuria, subtherapeutic INR and hematuria. MDM  No diagnosis found. Hospitalist service to arrange for inpatient stay on medical floor Lovenox ordered Diagnosis #1 psychosis #2 hematuria #3 subtherapeutic INR #4 hypokalemia   Doug Sou, MD 01/25/13 1649
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
## 2013-01-25 NOTE — Progress Notes (Signed)
[name] [id number] Code Status: FULL   Admission Data: 01/25/2013 6:46 PM Attending Provider:  Izola Price [id number] Not In System Consults/ Treatment Team:    [name] is a 42 y.o. female patient admitted from ED awake, alert - oriented  X 3 - no acute distress noted.  VSS - Blood pressure 104/70, pulse 74, temperature 97.8 F (36.6 C), temperature source Oral, resp. rate 18, height 5\' 6"  (1.676 m), weight 69.8 kg (153 lb 14.1 oz), last menstrual period 01/11/2013, SpO2 100.00%.  no c/o shortness of breath, no c/o chest pain.  O2:   n/a IV Fluids:  IV in place, occlusive dsg intact without redness, IV cath antecubital left, condition patent and no redness normal saline.  Allergies:   Allergies  Allergen Reactions  . Darvocet [Propoxyphene-Acetaminophen] Nausea And Vomiting and Other (See Comments)    Shaking      Past Medical History  Diagnosis Date  . Pulmonary emboli   . Schizophrenia   . Cancer   . Uterus cancer   . Anxiety   . Depression   . PTSD (post-traumatic stress disorder)    Medications Prior to Admission  Medication Sig Dispense Refill  . benztropine (COGENTIN) 0.5 MG tablet Take 0.5 mg by mouth 2 (two) times daily.      . cyclobenzaprine (FLEXERIL) 5 MG tablet Take 5 mg by mouth 3 (three) times daily as needed for muscle spasms (muscle spsasms).      . diazepam (VALIUM) 5 MG tablet Take 5 mg by mouth 3 (three) times daily.       Marland Kitchen HYDROcodone-acetaminophen (NORCO) 10-325 MG per tablet Take 1 tablet by mouth 4 (four) times daily.      . methocarbamol (ROBAXIN) 500 MG tablet Take 250 mg by mouth 3 (three) times daily.      . Oxcarbazepine (TRILEPTAL) 300 MG tablet Take 300 mg by mouth 2 (two) times daily.      . traZODone (DESYREL) 100 MG tablet Take 100 mg by mouth at bedtime.      Marland Kitchen warfarin (COUMADIN) 5 MG tablet Take 5-7.5 mg by mouth daily. Take 1 tablet (5mg ) all days except Sunday take 1 & 1/2 tablet (7.5mg ) per patient; Pharmacy reports 1 tablet (5mg )  daily except 1/2 tablet (2.5mg ) on Sunday       History:  obtained from the patient.  Tobacco/alcohol: Smoked 1 packs per day for 30 years. History of alcohol and drug use  Orientation to room, and floor completed with information packet given to patient/family.  Patient declined safety video at this time.  Admission INP armband ID verified with patient/family, and in place.   SR up x 2, fall assessment complete, with patient and family able to verbalize understanding of risk associated with falls, and verbalized understanding to call nsg before up out of bed.  Call light within reach, patient able to voice, and demonstrate understanding.      Will cont to eval and treat per MD orders.  Aldean Ast, RN 01/25/2013 6:46 PM

## 2013-01-26 DIAGNOSIS — E876 Hypokalemia: Secondary | ICD-10-CM

## 2013-01-26 LAB — CBC
HCT: 37.6 % (ref 36.0–46.0)
Hemoglobin: 13.1 g/dL (ref 12.0–15.0)
MCH: 32.3 pg (ref 26.0–34.0)
MCHC: 34.8 g/dL (ref 30.0–36.0)
MCV: 92.6 fL (ref 78.0–100.0)
Platelets: 350 10*3/uL (ref 150–400)
RBC: 4.06 MIL/uL (ref 3.87–5.11)

## 2013-01-26 LAB — HEMOGLOBIN A1C
Hgb A1c MFr Bld: 5.7 % — ABNORMAL HIGH (ref <5.7)
Mean Plasma Glucose: 117 mg/dL — ABNORMAL HIGH (ref <117)

## 2013-01-26 LAB — BASIC METABOLIC PANEL
BUN: 6 mg/dL (ref 6–23)
Calcium: 8.7 mg/dL (ref 8.4–10.5)
Chloride: 108 mEq/L (ref 96–112)
GFR calc non Af Amer: >90 mL/min (ref >90)
Glucose, Bld: 86 mg/dL (ref 70–99)
Potassium: 3.6 mEq/L (ref 3.5–5.1)
Sodium: 142 mEq/L (ref 135–145)

## 2013-01-26 LAB — PROTIME-INR: Prothrombin Time: 22.3 seconds — ABNORMAL HIGH (ref 11.6–15.2)

## 2013-01-26 MED ORDER — RISPERIDONE 0.5 MG PO TBDP
0.5000 mg | ORAL_TABLET | Freq: Two times a day (BID) | ORAL | Status: DC
  Administered 2013-01-26 – 2013-01-27 (×2): 0.5 mg via ORAL
  Filled 2013-01-26 (×4): qty 1

## 2013-01-26 MED ORDER — RISPERIDONE MICROSPHERES 25 MG IM SUSR
25.0000 mg | INTRAMUSCULAR | Status: DC
  Administered 2013-01-26: 25 mg via INTRAMUSCULAR
  Filled 2013-01-26: qty 2

## 2013-01-26 MED ORDER — DIAZEPAM 2 MG PO TABS
2.0000 mg | ORAL_TABLET | Freq: Once | ORAL | Status: AC
  Administered 2013-01-26: 2 mg via ORAL
  Filled 2013-01-26: qty 1

## 2013-01-26 MED ORDER — RIVAROXABAN 20 MG PO TABS
20.0000 mg | ORAL_TABLET | Freq: Every day | ORAL | Status: DC
  Administered 2013-01-26 – 2013-01-27 (×2): 20 mg via ORAL
  Filled 2013-01-26 (×3): qty 1

## 2013-01-26 MED ORDER — RIVAROXABAN 20 MG PO TABS: 20.0000 mg | ORAL_TABLET | Freq: Every day | ORAL | Status: DC

## 2013-01-26 NOTE — Care Management Note (Signed)
    Page 1 of 1   01/28/2013     8:38:05 AM   CARE MANAGEMENT NOTE 01/28/2013  Patient:  [facility]   Account Number:  [id number]  Date Initiated:  01/26/2013  Documentation initiated by:  Letha Cape  Subjective/Objective Assessment:   dx amenorrhea, psychosis, pna  admit- lives alone.     Action/Plan:   psych consult- 10/27   Anticipated DC Date:  01/27/2013   Anticipated DC Plan:  PSYCHIATRIC HOSPITAL  In-house referral  Clinical Social Worker      DC Planning Services  CM consult      Choice offered to / List presented to:             Status of service:  Completed, signed off Medicare Important Message given?   (If response is "NO", the following Medicare IM given date fields will be blank) Date Medicare IM given:   Date Additional Medicare IM given:    Discharge Disposition:  PSYCHIATRIC HOSPITAL  Per UR Regulation:  Reviewed for med. necessity/level of care/duration of stay  If discussed at Long Length of Stay Meetings, dates discussed:    Comments:  01/26/13 12:18 Letha Cape RN, BSN [phone] per MD patient is medically cleared, physch consulted, patient is pacing in her room.  Physcy CSW aware,  she states physch has to see patient and if physch determines patient needs inpt [facility] then patient may possibly be able to dc to [facility] tomorrow, but we have to wait for physch to see patient.

## 2013-01-26 NOTE — Progress Notes (Signed)
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 Non-compliant with medications To switch to Xarelto today  INR today = 2.03   Plan: 1) DC Lovenox and Coumadin 2) Begin Xarelto at 20 mg po daily 3) Transfer to [facility] pending  Thank you. Okey Regal, PharmD [phone]

## 2013-01-26 NOTE — Consult Note (Signed)
Reason for Consult: Psychosis Referring Physician: Dr. Avon Gully is an 42 y.o. female.  HPI: [name] is known to this provider from previous encounter in psych emergency department in [facility] and known to the [facility] from several psychiatric hospitalization and recent admit was Sep 2014. Patient is currently floridly psychotic, paranoid, grandiose and says she is here to save the children of the world and holding her stomach saying she needs to protect her ovaries. Patient appeared speaking herself and whispering and obviously responding to herself. She is stating, "my organs are falling out of my body," "I have 2 souls, 4 different ways, I'm Earth." Patient states that lot of people are after her money and trying to harm her. She feels she needs to hurt everyone that is trying to hurt her, very non specific at this visit. She is restless, labile, soft speech and some what bizarre. She had a 2 week stay during last hospitalization and repeatedly threw herself out of the bed daily.   Mental Status Examination: Patient is pacing in her room and talking herself, cooperative and respectful. She has poor insight, judgment and impulse control. He has soft voice but thought process is with FOI and LOA. Patient has denied suicidal, but has homicidal ideations, without specific targets which is part of her paranoid delusions. Patient has been responding to internal stimuli and currently floridly psychotic.   Past Medical History  Diagnosis Date  . Pulmonary emboli   . Schizophrenia   . Cancer   . Uterus cancer   . Anxiety   . Depression   . PTSD (post-traumatic stress disorder)     Past Surgical History  Procedure Laterality Date  . No past surgeries    . Uterine fibroid surgery      uterine cancer    No family history on file.  Social History:  reports that she has been smoking Cigarettes.  She has a 45 pack-year smoking history. She has never used smokeless tobacco. She  reports that she uses illicit drugs. She reports that she does not drink alcohol.  Allergies:  Allergies  Allergen Reactions  . Darvocet [Propoxyphene-Acetaminophen] Nausea And Vomiting and Other (See Comments)    Shaking     Medications: I have reviewed the patient's current medications.  Results for orders placed during the hospital encounter of 01/25/13 (from the past 48 hour(s))  CBC WITH DIFFERENTIAL     Status: Abnormal   Collection Time    01/25/13  1:50 PM      Result Value Range   WBC 10.3  4.0 - 10.5 K/uL   RBC 4.34  3.87 - 5.11 MIL/uL   Hemoglobin 13.8  12.0 - 15.0 g/dL   HCT 16.1  09.6 - 04.5 %   MCV 90.1  78.0 - 100.0 fL   MCH 31.8  26.0 - 34.0 pg   MCHC 35.3  30.0 - 36.0 g/dL   RDW 40.9 (*) 81.1 - 91.4 %   Platelets 404 (*) 150 - 400 K/uL   Neutrophils Relative % 61  43 - 77 %   Neutro Abs 6.3  1.7 - 7.7 K/uL   Lymphocytes Relative 25  12 - 46 %   Lymphs Abs 2.5  0.7 - 4.0 K/uL   Monocytes Relative 12  3 - 12 %   Monocytes Absolute 1.3 (*) 0.1 - 1.0 K/uL   Eosinophils Relative 2  0 - 5 %   Eosinophils Absolute 0.2  0.0 -  0.7 K/uL   Basophils Relative 0  0 - 1 %   Basophils Absolute 0.0  0.0 - 0.1 K/uL  COMPREHENSIVE METABOLIC PANEL     Status: Abnormal   Collection Time    01/25/13  1:50 PM      Result Value Range   Sodium 138  135 - 145 mEq/L   Potassium 3.2 (*) 3.5 - 5.1 mEq/L   Chloride 100  96 - 112 mEq/L   CO2 26  19 - 32 mEq/L   Glucose, Bld 128 (*) 70 - 99 mg/dL   BUN 7  6 - 23 mg/dL   Creatinine, Ser 0.98  0.50 - 1.10 mg/dL   Calcium 9.2  8.4 - 11.9 mg/dL   Total Protein 6.9  6.0 - 8.3 g/dL   Albumin 3.4 (*) 3.5 - 5.2 g/dL   AST 23  0 - 37 U/L   ALT 11  0 - 35 U/L   Alkaline Phosphatase 103  39 - 117 U/L   Total Bilirubin 0.5  0.3 - 1.2 mg/dL   GFR calc non Af Amer >90  >90 mL/min   GFR calc Af Amer >90  >90 mL/min   Comment: (NOTE)     The eGFR has been calculated using the CKD EPI equation.     This calculation has not been validated in  all clinical situations.     eGFR's persistently <90 mL/min signify possible Chronic Kidney     Disease.  ACETAMINOPHEN LEVEL     Status: None   Collection Time    01/25/13  1:50 PM      Result Value Range   Acetaminophen (Tylenol), Serum <15.0  10 - 30 ug/mL   Comment:            THERAPEUTIC CONCENTRATIONS VARY     SIGNIFICANTLY. A RANGE OF 10-30     ug/mL MAY BE AN EFFECTIVE     CONCENTRATION FOR MANY PATIENTS.     HOWEVER, SOME ARE BEST TREATED     AT CONCENTRATIONS OUTSIDE THIS     RANGE.     ACETAMINOPHEN CONCENTRATIONS     >150 ug/mL AT 4 HOURS AFTER     INGESTION AND >50 ug/mL AT 12     HOURS AFTER INGESTION ARE     OFTEN ASSOCIATED WITH TOXIC     REACTIONS.  SALICYLATE LEVEL     Status: Abnormal   Collection Time    01/25/13  1:50 PM      Result Value Range   Salicylate Lvl <2.0 (*) 2.8 - 20.0 mg/dL  URINE RAPID DRUG SCREEN (HOSP PERFORMED)     Status: Abnormal   Collection Time    01/25/13  2:12 PM      Result Value Range   Opiates POSITIVE (*) NONE DETECTED   Cocaine NONE DETECTED  NONE DETECTED   Benzodiazepines POSITIVE (*) NONE DETECTED   Amphetamines NONE DETECTED  NONE DETECTED   Tetrahydrocannabinol POSITIVE (*) NONE DETECTED   Barbiturates NONE DETECTED  NONE DETECTED   Comment:            DRUG SCREEN FOR MEDICAL PURPOSES     ONLY.  IF CONFIRMATION IS NEEDED     FOR ANY PURPOSE, NOTIFY LAB     WITHIN 5 DAYS.                LOWEST DETECTABLE LIMITS     FOR URINE DRUG SCREEN     Drug Class  Cutoff (ng/mL)     Amphetamine      1000     Barbiturate      200     Benzodiazepine   200     Tricyclics       300     Opiates          300     Cocaine          300     THC              50  URINALYSIS, ROUTINE W REFLEX MICROSCOPIC     Status: Abnormal   Collection Time    01/25/13  2:12 PM      Result Value Range   Color, Urine RED (*) YELLOW   Comment: BIOCHEMICALS MAY BE AFFECTED BY COLOR   APPearance CLOUDY (*) CLEAR   Specific Gravity, Urine  1.022  1.005 - 1.030   pH 5.5  5.0 - 8.0   Glucose, UA NEGATIVE  NEGATIVE mg/dL   Hgb urine dipstick LARGE (*) NEGATIVE   Bilirubin Urine MODERATE (*) NEGATIVE   Ketones, ur 15 (*) NEGATIVE mg/dL   Protein, ur 30 (*) NEGATIVE mg/dL   Urobilinogen, UA 1.0  0.0 - 1.0 mg/dL   Nitrite NEGATIVE  NEGATIVE   Leukocytes, UA MODERATE (*) NEGATIVE  URINE MICROSCOPIC-ADD ON     Status: None   Collection Time    01/25/13  2:12 PM      Result Value Range   Squamous Epithelial / LPF RARE  RARE   WBC, UA 11-20  <3 WBC/hpf   RBC / HPF TOO NUMEROUS TO COUNT  <3 RBC/hpf  PROTIME-INR     Status: None   Collection Time    01/25/13  2:15 PM      Result Value Range   Prothrombin Time 15.2  11.6 - 15.2 seconds   INR 1.23  0.00 - 1.49  POCT PREGNANCY, URINE     Status: None   Collection Time    01/25/13  2:17 PM      Result Value Range   Preg Test, Ur NEGATIVE  NEGATIVE   Comment:            THE SENSITIVITY OF THIS     METHODOLOGY IS >24 mIU/mL  CBC     Status: Abnormal   Collection Time    01/26/13  5:12 AM      Result Value Range   WBC 7.4  4.0 - 10.5 K/uL   RBC 4.06  3.87 - 5.11 MIL/uL   Hemoglobin 13.1  12.0 - 15.0 g/dL   HCT 95.6  21.3 - 08.6 %   MCV 92.6  78.0 - 100.0 fL   MCH 32.3  26.0 - 34.0 pg   MCHC 34.8  30.0 - 36.0 g/dL   RDW 57.8 (*) 46.9 - 62.9 %   Platelets 350  150 - 400 K/uL  BASIC METABOLIC PANEL     Status: None   Collection Time    01/26/13  5:12 AM      Result Value Range   Sodium 142  135 - 145 mEq/L   Potassium 3.6  3.5 - 5.1 mEq/L   Chloride 108  96 - 112 mEq/L   CO2 26  19 - 32 mEq/L   Glucose, Bld 86  70 - 99 mg/dL   BUN 6  6 - 23 mg/dL   Creatinine, Ser 5.28  0.50 - 1.10 mg/dL   Calcium 8.7  8.4 - 10.5 mg/dL   GFR calc non Af Amer >90  >90 mL/min   GFR calc Af Amer >90  >90 mL/min   Comment: (NOTE)     The eGFR has been calculated using the CKD EPI equation.     This calculation has not been validated in all clinical situations.     eGFR's persistently  <90 mL/min signify possible Chronic Kidney     Disease.  HEMOGLOBIN A1C     Status: Abnormal   Collection Time    01/26/13  5:12 AM      Result Value Range   Hemoglobin A1C 5.7 (*) <5.7 %   Comment: (NOTE)                                                                               According to the ADA Clinical Practice Recommendations for 2011, when     HbA1c is used as a screening test:      >=6.5%   Diagnostic of Diabetes Mellitus               (if abnormal result is confirmed)     5.7-6.4%   Increased risk of developing Diabetes Mellitus     References:Diagnosis and Classification of Diabetes Mellitus,Diabetes     Care,2011,34(Suppl 1):S62-S69 and Standards of Medical Care in             Diabetes - 2011,Diabetes Care,2011,34 (Suppl 1):S11-S61.   Mean Plasma Glucose 117 (*) <117 mg/dL   Comment: Performed at [facility]  PROTIME-INR     Status: Abnormal   Collection Time    01/26/13  5:12 AM      Result Value Range   Prothrombin Time 22.3 (*) 11.6 - 15.2 seconds   INR 2.03 (*) 0.00 - 1.49  TSH     Status: None   Collection Time    01/26/13  5:12 AM      Result Value Range   TSH 0.484  0.350 - 4.500 uIU/mL   Comment: Performed at [facility]  GLUCOSE, CAPILLARY     Status: None   Collection Time    01/26/13  7:50 AM      Result Value Range   Glucose-Capillary 90  70 - 99 mg/dL    No results found.  Positive for mood swings, sleep disturbance and restless, paranoid, delusional and homicidal ideations. Blood pressure 99/64, pulse 93, temperature 98.6 F (37 C), temperature source Oral, resp. rate 18, height 5\' 6"  (1.676 m), weight 69.8 kg (153 lb 14.1 oz), last menstrual period 01/11/2013, SpO2 95.00%.   Assessment/Plan: Schizophrenia, chronic paranoid   Recommendations: Patient requires acute psych hospitalization and [facility] has no beds available today as per administration Patient does not meet criteria for capacity to make medical decisions as she  has been floridly psychotic Recommend forced/involuntary medication administration, case discussed with Dr. Jerral Ralph, who will be seconds the forced medication as per the state of [place] requirement, two MD's has to recommend the forced treatment based on the patient best interest. Start Risperidone M tab 0.5 mg PO BID/psychosis Give Risperidal Consta 25 mg IM x once every two weeks for non compliance with antipsychotic mediation Continue  current medications Refer to psych social service for finding appropriate psych bed including [facility] Appreciate psych consult and follow up as needed  Jabaree Mercado,JANARDHAHA R. 01/26/2013, 3:21 PM

## 2013-01-26 NOTE — Progress Notes (Signed)
Agree with Dr Nancy Marus patient is actively psychotic-she will need involuntary medication administration.

## 2013-01-26 NOTE — Progress Notes (Signed)
Awakened patient to give 0500 Lovenox. Patient does not go back to sleep. Patient crying with NT staying in pain. Patient states pain in lower abdomen and motions with her hands toward peri area. RN and NT ambulate patient to restroom. No urine during  night shift. Patient did not urinate, and states does not need to urinate. Patient has peri pad on with small amount of dark red blood noted. Patient changed peri pads, ambulated back to bed. Patient states during this visit she wrote a last will and testament with a crayon. Patient states needs paper to destroy. Patient states does not any longer intend on that paper to be her last wil and testament. Deferred to physician's order, as belongings were removed in ED. Patient sitting up in bed. Refuses SCD's. States does not need those. "No thank you ma'am". Patient states needs the skin 'redone" on two of her fingers on both hands. Patient will not sit back in bed.

## 2013-01-26 NOTE — Treatment Plan (Signed)
Dr. Lucianne Muss has accepted pt to [facility] pending an appropriate [address] bed, which is not available today (10/27) as the [address] is full.

## 2013-01-26 NOTE — Progress Notes (Signed)
Sedated through night. Awakens when I call her name. Opens eyes. Follows commands. Does not speak to me when spoken to. Returns immediately back to sleep.

## 2013-01-26 NOTE — Progress Notes (Addendum)
Contacted Dr Claiborne Billings about patient anxiety. Returned to room. Patient has removed IV. Patient states she will not need the IV anymore. Physician has given order for 2mg  valium. Patient refuses valium. She puts in left hand, and left begins to shake. She takes pill from left hand and puts on left forearm. I offer to give her the pill, and she refuses. She then hands pill back to me. I approach patient again and ask her to open her mouth for medicine., She follows command and swallows pill.

## 2013-01-26 NOTE — Discharge Summary (Addendum)
PATIENT DETAILS Name: [name] Age: 42 y.o. Sex: female Date of Birth: 1971/03/29 MRN: [id number]. Admit Date: 01/25/2013 Admitting Physician: Dorothea Ogle, MD [id number] Not In System  Recommendations for Outpatient Follow-up:  1. Have switched her to Xarelto, patient is actively psychotic/hallucinating and would be very unreliable with overlapping Lovenox injections. Once she is more stable in terms of her psychiatric issues, please reassess if she needs to come back on Coumadin. She has a history of underlying hypercoagulable state, if she needs to come back on Coumadin, she would need to be overlapped with Lovenox. 2. Consider medical consultation if needed while [facility] the [facility]  PRIMARY DISCHARGE DIAGNOSIS:  Psychosis  Subtherapeutic INR     PAST MEDICAL HISTORY: Past Medical History  Diagnosis Date  . Pulmonary emboli   . Schizophrenia   . Cancer   . Uterus cancer   . Anxiety   . Depression   . PTSD (post-traumatic stress disorder)     DISCHARGE MEDICATIONS:   Medication List    STOP taking these medications       benztropine 0.5 MG tablet  Commonly known as:  COGENTIN     methocarbamol 500 MG tablet  Commonly known as:  ROBAXIN     Oxcarbazepine 300 MG tablet  Commonly known as:  TRILEPTAL     traZODone 100 MG tablet  Commonly known as:  DESYREL     warfarin 5 MG tablet  Commonly known as:  COUMADIN      TAKE these medications       cyclobenzaprine 5 MG tablet  Commonly known as:  FLEXERIL  Take 5 mg by mouth 3 (three) times daily as needed for muscle spasms (muscle spsasms).     diazepam 5 MG tablet  Commonly known as:  VALIUM  Take 5 mg by mouth 3 (three) times daily.     HYDROcodone-acetaminophen 10-325 MG per tablet  Commonly known as:  NORCO  Take 1 tablet by mouth 4 (four) times daily.     risperiDONE 0.5 MG disintegrating tablet  Commonly known as:  RISPERDAL M-TABS  Take 1 tablet (0.5 mg total) by mouth 2 (two)  times daily.     risperiDONE microspheres 25 MG injection  Commonly known as:  RISPERDAL CONSTA  Inject 2 mLs (25 mg total) into the muscle every 14 (fourteen) days.     Rivaroxaban 20 MG Tabs tablet  Commonly known as:  XARELTO  Take 1 tablet (20 mg total) by mouth daily.        ALLERGIES:   Allergies  Allergen Reactions  . Darvocet [Propoxyphene-Acetaminophen] Nausea And Vomiting and Other (See Comments)    Shaking     BRIEF HPI:  See H&P, Labs, Consult and Test reports for all details in brief, patient is a 42 year old white female with a history of depression and schizophrenia, who presented to the ED after she was found to be actively hallucinating. She also has a history of hypercoagulable state resulting in venous thromboembolism, and was found to have a subtherapeutic INR. She was admitted to the medical service, because she had a subtherapeutic INR.  CONSULTATIONS:   psychiatry  PERTINENT RADIOLOGIC STUDIES: No results found.   PERTINENT LAB RESULTS: CBC:  Recent Labs  01/25/13 1350 01/26/13 0512  WBC 10.3 7.4  HGB 13.8 13.1  HCT 39.1 37.6  PLT 404* 350   CMET CMP     Component Value Date/Time   NA 142 01/26/2013 0512   K 3.6  01/26/2013 0512   CL 108 01/26/2013 0512   CO2 26 01/26/2013 0512   GLUCOSE 86 01/26/2013 0512   BUN 6 01/26/2013 0512   CREATININE 0.76 01/26/2013 0512   CREATININE 0.93 12/25/2010 1157   CALCIUM 8.7 01/26/2013 0512   PROT 6.9 01/25/2013 1350   ALBUMIN 3.4* 01/25/2013 1350   AST 23 01/25/2013 1350   ALT 11 01/25/2013 1350   ALKPHOS 103 01/25/2013 1350   BILITOT 0.5 01/25/2013 1350   GFRNONAA >90 01/26/2013 0512   GFRAA >90 01/26/2013 0512    GFR Estimated Creatinine Clearance: 84.9 ml/min (by C-G formula based on Cr of 0.76). No results found for this basename: LIPASE, AMYLASE,  in the last 72 hours No results found for this basename: CKTOTAL, CKMB, CKMBINDEX, TROPONINI,  in the last 72 hours No components found with  this basename: POCBNP,  No results found for this basename: DDIMER,  in the last 72 hours  Recent Labs  01/26/13 0512  HGBA1C 5.7*   No results found for this basename: CHOL, HDL, LDLCALC, TRIG, CHOLHDL, LDLDIRECT,  in the last 72 hours  Recent Labs  01/26/13 0512  TSH 0.484   No results found for this basename: VITAMINB12, FOLATE, FERRITIN, TIBC, IRON, RETICCTPCT,  in the last 72 hours Coags:  Recent Labs  01/25/13 1415 01/26/13 0512  INR 1.23 2.03*   Microbiology: No results found for this or any previous visit (from the past 240 hour(s)).   BRIEF HOSPITAL COURSE:  Subtherapeutic INR-apparent history of recurrent venous thromboembolism - Currently patient is actively psychotic, hallucinating, refusing Lovenox injections, she has already ripped out her IV line. She is not going to going to be cooperative with Lovenox injections, hence we will switch her to Xarelto. Once she is much more stable in terms of her psych issues, please reevaluate to see if she needs to come back on Coumadin. If so, she will need to be overlapped with Lovenox. Consider a medical consultation if needed while at behavioral [facility].  Psychosis/hallucinations - Patient it was observed this morning actively hallucinating, pacing around in the room talking to herself. This is the primary problem of the patient, she likely will need to be transferred to inpatient psych for further stabilization. Psych consult was obtained, it was felt that the patient would need involve entry medication administration, I agreed with the consulting psychiatrist. She has been placed on the above-noted medications. She is currently stable to be transferred to an inpatient psych facility.   History of schizophrenia/depression - Continue above medications a month dosing by psychiatry.  Rest of her other issues are stable  TODAY-DAY OF DISCHARGE:  Subjective:   [name] today has stable vital signs, she is actively  hallucinating and has been refusing Lovenox and has pulled out her IV line.   Objective:   Blood pressure 99/64, pulse 93, temperature 98.6 F (37 C), temperature source Oral, resp. rate 18, height 5\' 6"  (1.676 m), weight 69.8 kg (153 lb 14.1 oz), last menstrual period 01/11/2013, SpO2 95.00%.  Intake/Output Summary (Last 24 hours) at 01/27/13 1027 Last data filed at 01/26/13 1400  Gross per 24 hour  Intake    400 ml  Output      0 ml  Net    400 ml   Filed Weights   01/25/13 1318 01/25/13 1826  Weight: 68.629 kg (151 lb 4.8 oz) 69.8 kg (153 lb 14.1 oz)    Exam Awake Alert, Oriented *3, No new F.N deficits, Normal affect  [place].AT,PERRAL Supple Neck,No JVD, No cervical lymphadenopathy appriciated.  Symmetrical Chest wall movement, Good air movement bilaterally, CTAB RRR,No Gallops,Rubs or new Murmurs, No Parasternal Heave +ve B.Sounds, Abd Soft, Non tender, No organomegaly appriciated, No rebound -guarding or rigidity. No Cyanosis, Clubbing or edema, No new Rash or bruise  DISCHARGE CONDITION: Stable  DISPOSITION: Inpatient Psych  DISCHARGE INSTRUCTIONS:    Activity:  As tolerated   Diet recommendation: Heart Healthy diet   Discharge Orders   Future Orders Complete By Expires   Diet - low sodium heart healthy  As directed    Increase activity slowly  As directed       Follow-up Information   Follow up with Primary Care MD. Schedule an appointment as soon as possible for a visit in 1 week.        Total Time spent on discharge equals 45 minutes.  SignedJeoffrey Massed 01/27/2013 10:27 AM

## 2013-01-26 NOTE — Progress Notes (Signed)
Patient hallucinating. Using her hands to move things in mid air. Talking to herself.

## 2013-01-26 NOTE — Progress Notes (Signed)
Unable to complete required admission information. Pt is actively hallucinating and confused when answering questions. Pt pulled out IV. MD verbal order to not place new IV.   Aldean Ast

## 2013-01-27 ENCOUNTER — Encounter (HOSPITAL_COMMUNITY): Payer: Self-pay

## 2013-01-27 ENCOUNTER — Inpatient Hospital Stay (HOSPITAL_COMMUNITY)
Admission: AD | Admit: 2013-01-27 | Discharge: 2013-02-05 | DRG: 885 | Disposition: A | Discharge status: Home / Self Care | Payer: Medicaid Other | Source: Intra-hospital | Attending: Psychiatry | Admitting: Psychiatry

## 2013-01-27 ENCOUNTER — Ambulatory Visit: Admit: 2013-01-27 | Payer: Self-pay | Admitting: Oral Surgery

## 2013-01-27 DIAGNOSIS — I7300 Raynaud's syndrome without gangrene: Secondary | ICD-10-CM

## 2013-01-27 DIAGNOSIS — R000 Tachycardia, unspecified: Secondary | ICD-10-CM

## 2013-01-27 DIAGNOSIS — R0602 Shortness of breath: Secondary | ICD-10-CM

## 2013-01-27 DIAGNOSIS — I38 Endocarditis, valve unspecified: Secondary | ICD-10-CM

## 2013-01-27 DIAGNOSIS — K769 Liver disease, unspecified: Secondary | ICD-10-CM

## 2013-01-27 DIAGNOSIS — R0902 Hypoxemia: Secondary | ICD-10-CM

## 2013-01-27 DIAGNOSIS — F1210 Cannabis abuse, uncomplicated: Secondary | ICD-10-CM | POA: Diagnosis present

## 2013-01-27 DIAGNOSIS — I2699 Other pulmonary embolism without acute cor pulmonale: Secondary | ICD-10-CM

## 2013-01-27 DIAGNOSIS — F200 Paranoid schizophrenia: Principal | ICD-10-CM | POA: Diagnosis present

## 2013-01-27 DIAGNOSIS — J189 Pneumonia, unspecified organism: Secondary | ICD-10-CM

## 2013-01-27 DIAGNOSIS — F29 Unspecified psychosis not due to a substance or known physiological condition: Secondary | ICD-10-CM

## 2013-01-27 DIAGNOSIS — Z91199 Patient's noncompliance with other medical treatment and regimen due to unspecified reason: Secondary | ICD-10-CM

## 2013-01-27 DIAGNOSIS — Z720 Tobacco use: Secondary | ICD-10-CM

## 2013-01-27 DIAGNOSIS — I8290 Acute embolism and thrombosis of unspecified vein: Secondary | ICD-10-CM

## 2013-01-27 DIAGNOSIS — N912 Amenorrhea, unspecified: Secondary | ICD-10-CM

## 2013-01-27 DIAGNOSIS — N390 Urinary tract infection, site not specified: Secondary | ICD-10-CM

## 2013-01-27 DIAGNOSIS — F209 Schizophrenia, unspecified: Secondary | ICD-10-CM

## 2013-01-27 DIAGNOSIS — Z7901 Long term (current) use of anticoagulants: Secondary | ICD-10-CM

## 2013-01-27 DIAGNOSIS — D72829 Elevated white blood cell count, unspecified: Secondary | ICD-10-CM

## 2013-01-27 DIAGNOSIS — Z86711 Personal history of pulmonary embolism: Secondary | ICD-10-CM

## 2013-01-27 DIAGNOSIS — F4310 Post-traumatic stress disorder, unspecified: Secondary | ICD-10-CM | POA: Diagnosis present

## 2013-01-27 DIAGNOSIS — E876 Hypokalemia: Secondary | ICD-10-CM

## 2013-01-27 DIAGNOSIS — R42 Dizziness and giddiness: Secondary | ICD-10-CM

## 2013-01-27 DIAGNOSIS — Z9119 Patient's noncompliance with other medical treatment and regimen: Secondary | ICD-10-CM

## 2013-01-27 SURGERY — MULTIPLE EXTRACTION WITH ALVEOLOPLASTY
Anesthesia: Choice | Site: Mouth

## 2013-01-27 MED ORDER — FLUPHENAZINE HCL 5 MG PO TABS
5.0000 mg | ORAL_TABLET | Freq: Two times a day (BID) | ORAL | Status: DC
  Filled 2013-01-27 (×3): qty 1

## 2013-01-27 MED ORDER — HYDROCODONE-ACETAMINOPHEN 10-325 MG PO TABS
1.0000 | ORAL_TABLET | Freq: Four times a day (QID) | ORAL | Status: DC | PRN
  Administered 2013-01-27 – 2013-02-04 (×14): 1 via ORAL
  Filled 2013-01-27 (×14): qty 1

## 2013-01-27 MED ORDER — RISPERIDONE MICROSPHERES 25 MG IM SUSR: 25.0000 mg | INTRAMUSCULAR | Status: DC

## 2013-01-27 MED ORDER — CYCLOBENZAPRINE HCL 10 MG PO TABS
5.0000 mg | ORAL_TABLET | Freq: Three times a day (TID) | ORAL | Status: DC | PRN
  Administered 2013-01-27 – 2013-02-05 (×4): 5 mg via ORAL
  Filled 2013-01-27 (×6): qty 1

## 2013-01-27 MED ORDER — CYCLOBENZAPRINE HCL 10 MG PO TABS: 5.0000 mg | ORAL_TABLET | Freq: Three times a day (TID) | ORAL | Status: DC | PRN

## 2013-01-27 MED ORDER — METHOCARBAMOL 500 MG PO TABS
250.0000 mg | ORAL_TABLET | Freq: Three times a day (TID) | ORAL | Status: DC
  Filled 2013-01-27: qty 0.5

## 2013-01-27 MED ORDER — WARFARIN - PHARMACIST DOSING INPATIENT: Freq: Every day | Status: DC

## 2013-01-27 MED ORDER — TRAZODONE HCL 100 MG PO TABS
100.0000 mg | ORAL_TABLET | Freq: Every day | ORAL | Status: DC
  Filled 2013-01-27: qty 1

## 2013-01-27 MED ORDER — MAGNESIUM HYDROXIDE 400 MG/5ML PO SUSP: 30.0000 mL | Freq: Every day | ORAL | Status: DC | PRN

## 2013-01-27 MED ORDER — RIVAROXABAN 20 MG PO TABS
20.0000 mg | ORAL_TABLET | Freq: Every day | ORAL | Status: DC
  Filled 2013-01-27 (×3): qty 1

## 2013-01-27 MED ORDER — MAGNESIUM HYDROXIDE 400 MG/5ML PO SUSP
30.0000 mL | Freq: Every day | ORAL | Status: DC | PRN
  Filled 2013-01-27: qty 30

## 2013-01-27 MED ORDER — ACETAMINOPHEN 325 MG PO TABS
650.0000 mg | ORAL_TABLET | Freq: Four times a day (QID) | ORAL | Status: DC | PRN
  Filled 2013-01-27: qty 2

## 2013-01-27 MED ORDER — DIAZEPAM 5 MG PO TABS
5.0000 mg | ORAL_TABLET | Freq: Three times a day (TID) | ORAL | Status: DC
  Administered 2013-01-27 – 2013-02-05 (×26): 5 mg via ORAL
  Filled 2013-01-27 (×27): qty 1

## 2013-01-27 MED ORDER — RISPERIDONE 0.5 MG PO TBDP: 0.5000 mg | ORAL_TABLET | Freq: Two times a day (BID) | ORAL | Status: DC

## 2013-01-27 MED ORDER — IBUPROFEN 600 MG PO TABS: 600.0000 mg | ORAL_TABLET | Freq: Three times a day (TID) | ORAL | Status: DC | PRN

## 2013-01-27 MED ORDER — BENZTROPINE MESYLATE 0.5 MG PO TABS
0.5000 mg | ORAL_TABLET | Freq: Two times a day (BID) | ORAL | Status: DC
  Filled 2013-01-27 (×3): qty 1

## 2013-01-27 MED ORDER — NICOTINE 21 MG/24HR TD PT24: 21.0000 mg | MEDICATED_PATCH | Freq: Every day | TRANSDERMAL | Status: DC

## 2013-01-27 MED ORDER — ALUM & MAG HYDROXIDE-SIMETH 200-200-20 MG/5ML PO SUSP: 30.0000 mL | ORAL | Status: DC | PRN

## 2013-01-27 MED ORDER — OXCARBAZEPINE 300 MG PO TABS
300.0000 mg | ORAL_TABLET | Freq: Two times a day (BID) | ORAL | Status: DC
  Filled 2013-01-27 (×3): qty 1

## 2013-01-27 MED ORDER — RISPERIDONE 0.5 MG PO TBDP
0.5000 mg | ORAL_TABLET | Freq: Two times a day (BID) | ORAL | Status: DC
  Administered 2013-01-29 – 2013-02-01 (×5): 0.5 mg via ORAL
  Filled 2013-01-27 (×16): qty 1

## 2013-01-27 MED ORDER — CALCIUM POLYCARBOPHIL 625 MG PO TABS
1250.0000 mg | ORAL_TABLET | Freq: Every day | ORAL | Status: DC
  Administered 2013-01-28 – 2013-02-05 (×5): 1250 mg via ORAL
  Filled 2013-01-27 (×11): qty 2

## 2013-01-27 MED ORDER — NICOTINE 21 MG/24HR TD PT24
21.0000 mg | MEDICATED_PATCH | Freq: Every day | TRANSDERMAL | Status: DC
  Administered 2013-01-28 – 2013-02-05 (×9): 21 mg via TRANSDERMAL
  Filled 2013-01-27 (×12): qty 1

## 2013-01-27 NOTE — Progress Notes (Signed)
D   Pt remains psychotic and unsteady on her feet  She needs strong and frequent redirection to maintain safety A  Pt on 1:1 R   Safe at present

## 2013-01-27 NOTE — Progress Notes (Signed)
NURSING PROGRESS NOTE  [name] [id number] Discharge Data: 01/27/2013 6:34 PM Attending Provider: No att. providers found [id number] Not In System     [name] to be D/C'd [facility] per MD order.    All IV's discontinued with no bleeding noted. Transported to [facility].  All belongings returned to patient for patient to take home.   Last Vital Signs:  Blood pressure 99/64, pulse 93, temperature 98.6 F (37 C), temperature source Oral, resp. rate 18, height 5\' 6"  (1.676 m), weight 69.8 kg (153 lb 14.1 oz), last menstrual period 01/11/2013, SpO2 95.00%.  Discharge Medication List   Medication List    STOP taking these medications       benztropine 0.5 MG tablet  Commonly known as:  COGENTIN     methocarbamol 500 MG tablet  Commonly known as:  ROBAXIN     Oxcarbazepine 300 MG tablet  Commonly known as:  TRILEPTAL     traZODone 100 MG tablet  Commonly known as:  DESYREL     warfarin 5 MG tablet  Commonly known as:  COUMADIN      TAKE these medications       cyclobenzaprine 5 MG tablet  Commonly known as:  FLEXERIL  Take 5 mg by mouth 3 (three) times daily as needed for muscle spasms (muscle spsasms).     diazepam 5 MG tablet  Commonly known as:  VALIUM  Take 5 mg by mouth 3 (three) times daily.     HYDROcodone-acetaminophen 10-325 MG per tablet  Commonly known as:  NORCO  Take 1 tablet by mouth 4 (four) times daily.     risperiDONE 0.5 MG disintegrating tablet  Commonly known as:  RISPERDAL M-TABS  Take 1 tablet (0.5 mg total) by mouth 2 (two) times daily.     risperiDONE microspheres 25 MG injection  Commonly known as:  RISPERDAL CONSTA  Inject 2 mLs (25 mg total) into the muscle every 14 (fourteen) days.     Rivaroxaban 20 MG Tabs tablet  Commonly known as:  XARELTO  Take 1 tablet (20 mg total) by mouth daily.        Madelin Rear, MSN, RN, [employer]

## 2013-01-27 NOTE — [facility] Group Notes (Signed)
Adult Psychoeducational Group Note  Date:  01/27/2013 Time:  9:18 PM  Group Topic/Focus:  Wrap-Up Group:   The focus of this group is to help patients review their daily goal of treatment and discuss progress on daily workbooks.  Participation Level:  Did Not Attend  Participation Quality:  None  Affect:  None  Cognitive:  None  Insight: None  Engagement in Group:  None  Modes of Intervention:  Discussion  Additional Comments:  Brynja did not attend group.  Caroll Rancher A 01/27/2013, 9:18 PM

## 2013-01-27 NOTE — Clinical Social Work Note (Signed)
CSW informed today by nurse case manager that patient had a bed at [facility]. Facility contacted and confirmed bed availability. CSW talked with patient and involuntary admission form signed and faxed to [facility]. CSW contacted [facility] transportation for patient's transport to [facility].  Genelle Bal, MSW, LCSW [phone]

## 2013-01-27 NOTE — Progress Notes (Signed)
Pt was made 1:1 due to being a high fall risk and remains safe at present

## 2013-01-27 NOTE — Progress Notes (Signed)
PATIENT DETAILS Name: [name] Age: 42 y.o. Sex: female Date of Birth: Aug 01, 1970 Admit Date: 01/25/2013 Admitting Physician Dorothea Ogle, MD [id number] Not In System  Subjective: She be much more calmer today-sleeping during my rounds, easily awakened will.  Assessment/Plan: Active Problems: Active psychosis - History of schizophrenia-noncompliant to medication - Seen by psychiatry yesterday, felt to need involuntary medication administration-I. agree with the consultant psychiatrist. Subsequently patient was given IM Risperdal. Somewhat better today. - [facility]  History of pulmonary embolism - Yesterday and on admission, patient was noted to be actively psychotic and was refusing medications and pulling out her IV lines. She was noted to have a subtherapeutic INR levels, because of active psychosis, it was felt that he would have a difficult time administering Lovenox and getting blood draws for frequent INRs, as is and we have switched over to Xarelto for the time being.  Rest of her issues are stable  Disposition: Remain inpatient  DVT Prophylaxis: Not needed as on Xarelto  Code Status: Full code   Family Communication None at bedside  Procedures:  None  CONSULTS:  psychiatry   MEDICATIONS: Scheduled Meds: . diazepam  5 mg Oral TID  . nicotine  21 mg Transdermal Daily  . risperiDONE  0.5 mg Oral BID  . risperiDONE microspheres  25 mg Intramuscular Q14 Days  . rivaroxaban  20 mg Oral Daily   Continuous Infusions:  PRN Meds:.cyclobenzaprine, HYDROcodone-acetaminophen, HYDROmorphone (DILAUDID) injection, ondansetron (ZOFRAN) IV, ondansetron  Antibiotics: Anti-infectives   None       PHYSICAL EXAM: Vital signs in last 24 hours: Filed Vitals:   01/25/13 1826 01/25/13 1831 01/25/13 2206 01/26/13 0501  BP: 104/70  112/64 99/64  Pulse: 74  68 93  Temp: 97.8 F (36.6 C)  97.6 F (36.4 C)  98.6 F (37 C)  TempSrc: Oral  Oral Oral  Resp: 18  18 18   Height: 5\' 6"  (1.676 m)     Weight: 69.8 kg (153 lb 14.1 oz)     SpO2: 100% 100% 100% 95%    Weight change:  Filed Weights   01/25/13 1318 01/25/13 1826  Weight: 68.629 kg (151 lb 4.8 oz) 69.8 kg (153 lb 14.1 oz)   Body mass index is 24.85 kg/(m^2).   Gen Exam: Awake  with clear speech.   Neck: Supple, No JVD.   Chest: B/L Clear.   CVS: S1 S2 Regular, no murmurs.  Abdomen: soft, BS +, non tender, non distended.  Extremities: no edema, lower extremities warm to touch. Neurologic: Non Focal.   Skin: No Rash.   Wounds: N/A.   Intake/Output from previous day:  Intake/Output Summary (Last 24 hours) at 01/27/13 1028 Last data filed at 01/26/13 1400  Gross per 24 hour  Intake    400 ml  Output      0 ml  Net    400 ml     LAB RESULTS: CBC  Recent Labs Lab 01/25/13 1350 01/26/13 0512  WBC 10.3 7.4  HGB 13.8 13.1  HCT 39.1 37.6  PLT 404* 350  MCV 90.1 92.6  MCH 31.8 32.3  MCHC 35.3 34.8  RDW 16.2* 16.7*  LYMPHSABS 2.5  --   MONOABS 1.3*  --   EOSABS 0.2  --   BASOSABS 0.0  --     Chemistries   Recent Labs Lab 01/25/13 1350 01/26/13 0512  NA 138 142  K 3.2* 3.6  CL 100 108  CO2 26 26  GLUCOSE 128* 86  BUN 7 6  CREATININE 0.75 0.76  CALCIUM 9.2 8.7    CBG:  Recent Labs Lab 01/26/13 0750  GLUCAP 90    GFR Estimated Creatinine Clearance: 84.9 ml/min (by C-G formula based on Cr of 0.76).  Coagulation profile  Recent Labs Lab 01/25/13 1415 01/26/13 0512  INR 1.23 2.03*    Cardiac Enzymes No results found for this basename: CK, CKMB, TROPONINI, MYOGLOBIN,  in the last 168 hours  No components found with this basename: POCBNP,  No results found for this basename: DDIMER,  in the last 72 hours  Recent Labs  01/26/13 0512  HGBA1C 5.7*   No results found for this basename: CHOL, HDL, LDLCALC, TRIG, CHOLHDL, LDLDIRECT,  in the last 72 hours  Recent Labs  01/26/13 0512   TSH 0.484   No results found for this basename: VITAMINB12, FOLATE, FERRITIN, TIBC, IRON, RETICCTPCT,  in the last 72 hours No results found for this basename: LIPASE, AMYLASE,  in the last 72 hours  Urine Studies No results found for this basename: UACOL, UAPR, USPG, UPH, UTP, UGL, UKET, UBIL, UHGB, UNIT, UROB, ULEU, UEPI, UWBC, URBC, UBAC, CAST, CRYS, UCOM, BILUA,  in the last 72 hours  MICROBIOLOGY: No results found for this or any previous visit (from the past 240 hour(s)).  RADIOLOGY STUDIES/RESULTS: No results found.  Jeoffrey Massed, MD  [facility] Pager:[phone]  If 7PM-7AM, please contact night-coverage [email or web address] Password [email or web address] 01/27/2013, 10:28 AM   LOS: 2 days

## 2013-01-27 NOTE — Progress Notes (Signed)
Patient refuses physical assessment this morning. Madelin Rear, MSN, RN, [employer]

## 2013-01-27 NOTE — Tx Team (Addendum)
Initial Interdisciplinary Treatment Plan  PATIENT STRENGTHS: (choose at least two) General fund of knowledge  PATIENT STRESSORS: Health problems Medication change or noncompliance   PROBLEM LIST: Problem List/Patient Goals Date to be addressed Date deferred Reason deferred Estimated date of resolution  Psychotic symptomology schizoaffective disorder                                                       DISCHARGE CRITERIA:  Adequate post-discharge living arrangements Improved stabilization in mood, thinking, and/or behavior Need for constant or close observation no longer present Safe-care adequate arrangements made Verbal commitment to aftercare and medication compliance  PRELIMINARY DISCHARGE PLAN: Attend aftercare/continuing care group Outpatient therapy  PATIENT/FAMIILY INVOLVEMENT: This treatment plan has been presented to and reviewed with the patient, [name], and/or family member, .  The patient and family have been given the opportunity to ask questions and make suggestions.  [name] 01/27/2013, 8:13 PM

## 2013-01-27 NOTE — Progress Notes (Signed)
Pt is a 42 year old female admitted with psychotic symptoms and schizoaffective disorder   She is moderately confused and has delusional beliefs that she is telepathic   She responds to internal stimuli talking to people who are not there   She has a history of frequent falls and is in poor compliance with fall precautions  She needs frequent redirection and prompting to perform ADL's   She believes her organs are falling out of her body and she has come to the hospital for a new body and soul   During the assessment she needed frequent redirection and reminders to stay on task   She is very paranoid and refused her psychotropic medications but did take her vicodin and requested valium  She also complained of constipation   Pt admission was completed and she was offered nourishment   She was put on a 1:1 for safety   She remains safe and difficult to redirect

## 2013-01-28 DIAGNOSIS — F200 Paranoid schizophrenia: Principal | ICD-10-CM

## 2013-01-28 DIAGNOSIS — F1210 Cannabis abuse, uncomplicated: Secondary | ICD-10-CM

## 2013-01-28 LAB — PROTIME-INR
INR: 2.31 — ABNORMAL HIGH (ref 0.00–1.49)
Prothrombin Time: 24.6 s — ABNORMAL HIGH (ref 11.6–15.2)

## 2013-01-28 MED ORDER — TRAZODONE HCL 50 MG PO TABS
50.0000 mg | ORAL_TABLET | Freq: Every evening | ORAL | Status: DC | PRN
  Administered 2013-01-29: 50 mg via ORAL
  Filled 2013-01-28 (×2): qty 1
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
## 2013-01-28 NOTE — Progress Notes (Signed)
Patient ID: [name], female   DOB: March 17, 1971, 42 y.o.   MRN: [id number] 1:1 observation note:  Patient has been up to take a bath.  Patient washed her hair and soaked in the tub.  She is wearing a gown, however, staff informed her that they would find her some clothes.  She is now walking up and down the hallway with sitter.  She is refusing all meds except for pain pill and valium.  She is clearly responding to internal stimuli.  Continue to observe patient's behavior and redirect as necessary.

## 2013-01-28 NOTE — Tx Team (Signed)
  Interdisciplinary Treatment Plan Update   Date Reviewed:  01/28/2013  Time Reviewed:  8:13 AM  Progress in Treatment:   Attending groups: No Participating in groups: No Taking medication as prescribed: Inconsistently Tolerating medication: Yes Family/Significant other contact made: No Patient understands diagnosis: No Limited insight  Discussing patient identified problems/goals with staff: Yes  See initial care plan Medical problems stabilized or resolved: Yes Denies suicidal/homicidal ideation: Yes  In tx team Patient has not harmed self or others: Yes  For review of initial/current patient goals, please see plan of care.  Estimated Length of Stay:  3-5 days  Reason for Continuation of Hospitalization: Delusions  Hallucinations Medication stabilization  New Problems/Goals identified:  N/A  Discharge Plan or Barriers:   return home, follow up outpt  Additional Comments:  Pt is a 42 year old female admitted with psychotic symptoms and schizoaffective disorder She is moderately confused and has delusional beliefs that she is telepathic She responds to internal stimuli talking to people who are not there  She needs frequent redirection and prompting to perform ADL's She believes her organs are falling out of her body and she has come to the hospital for a new body and soul  She is very paranoid and refused her psychotropic medications but did take her vicodin and requested valium She also complained of constipation Pt admission was completed and she was offered nourishment She was put on a 1:1 for safety She remains safe and difficult to redirect    Attendees:  Signature: Thedore Mins, MD 01/28/2013 8:13 AM   Signature: Richelle Ito, LCSW 01/28/2013 8:13 AM  Signature: Fransisca Kaufmann, NP 01/28/2013 8:13 AM  Signature: Joslyn Devon, RN 01/28/2013 8:13 AM  Signature: Liborio Nixon, RN 01/28/2013 8:13 AM  Signature:  01/28/2013 8:13 AM  Signature:   01/28/2013 8:13 AM   Signature:    Signature:    Signature:    Signature:    Signature:    Signature:      Scribe for Treatment Team:   Richelle Ito, LCSW  01/28/2013 8:13 AM

## 2013-01-28 NOTE — Progress Notes (Signed)
Recreation Therapy Notes  Date: 10.29.2014 Time: 9:30am Location: [address] [facility]  Group Topic: Leisure Education  Goal Area(s) Addresses:  Patient will verbalize activity of interest by end of group session. Patient will verbalize the ability to use positive leisure/recreation as a coping mechanism.  Behavioral Response: Did not attend  Jearl Klinefelter, LRT/CTRS  Alinna Siple L 01/28/2013 1:03 PM

## 2013-01-28 NOTE — Progress Notes (Signed)
Patient ID: [name], female   DOB: 1971-03-11, 42 y.o.   MRN: [id number] 1:1 observation note: Patient has been sitting in the chair in her room majority of the day.  She did bathe this morning and this afternoon she did walk up and down the hallway a few times.  She continues to refuse all meds except for her valium and pain pill. Patient is responding to internal stimuli.  She is having conversations with herself and appears to feed someone from her dinner tray.  Continue to monitor patient and she will remain a 1:1 for safety.

## 2013-01-28 NOTE — Progress Notes (Signed)
Pt remains in need of frequent redirection and is a high fall risk   She is on 1:1 and is safe at present

## 2013-01-28 NOTE — [facility] Group Notes (Signed)
[facility] [employer] Group Therapy  01/28/2013 , 2:06 PM    Type of Therapy:  [employer] Presentation  Participation Level:  Did not attend    Summary of Progress/Problems:  [name] from [employer] came to present his recovery story and play the guitar.    Daryel Gerald B 01/28/2013 , 2:06 PM

## 2013-01-28 NOTE — [facility] Group Notes (Signed)
[facility] LCSW Aftercare Discharge Planning Group Note   01/28/2013 10:00 AM  Participation Quality:  Did not attend    [place]

## 2013-01-28 NOTE — Progress Notes (Signed)
Patient ID: RAIMA GEATHERS, female   DOB: [birth year], 42 y.o.   MRN: [id number] D:Patient remains on 1:1 observation due high fall risk.  Patient remains extremely delusional and paranoid.  Patient states she went to the hospital because, "I was about die and then I had all my organs jerked out."  "people see me act strange, but that is how I am."  Patient stated she was just walking around "healing the earth."  Patient still has the belief that she can "give off energy."  She is refusing all her medications except vicodin, valium and flexaril.  She is also refusing her xarelto.  Patient has poor insight and judgement regarding her illness.  Patient was asked where she will go upon discharge and she stated, "I own my house; I just kicked my boyfriend out.  He has been taking me from one psyche hospital to the next."  She is clearly responding to internal stimuli, although she denies.  She denies any SI/HI.  Patient has poor hygiene; she has body odor and appears as if she has not bathed in some time. A:continue to monitor medication management and MD orders.  1:1 continued for safety.  R:Patient is redirectable.

## 2013-01-28 NOTE — Progress Notes (Signed)
1:1 progress note:  Pt is lying in bed with eyes closed at this time and appears asleep.  No distress observed.  Respirations even/unlabored.  Earlier in the shift, pt was sitting up in the chair in her room talking with the sitter.  Pt is still hearing voices and having delusional/disorganized thinking.  Pt was encouraged to make her needs known to the sitter so they could be passed to Clinical research associate.  Pt voiced understanding.  Pt denies SI/HI.  Pt says at this time, she does not feel she will need anything to help her sleep.  Support and encouragement offered.  Safety maintained with q15 minute checks.

## 2013-01-28 NOTE — [facility] Suicide Risk Assessment (Signed)
Suicide Risk Assessment  Admission Assessment     Nursing information obtained from:    Demographic factors:    Current Mental Status:    Loss Factors:    Historical Factors:    Risk Reduction Factors:     CLINICAL FACTORS:   Schizophrenia:   Paranoid or undifferentiated type Currently Psychotic Previous Psychiatric Diagnoses and Treatments  COGNITIVE FEATURES THAT CONTRIBUTE TO RISK:  Closed-mindedness Polarized thinking    SUICIDE RISK:   Minimal: No identifiable suicidal ideation.  Patients presenting with no risk factors but with morbid ruminations; may be classified as minimal risk based on the severity of the depressive symptoms  PLAN OF CARE:1. Admit for crisis management and stabilization. 2. Medication management to reduce current symptoms to base line and improve the     patient's overall level of functioning 3. Treat health problems as indicated. 4. Develop treatment plan to decrease risk of relapse upon discharge and the need for     readmission. 5. Psycho-social education regarding relapse prevention and self care. 6. Health care follow up as needed for medical problems. 7. Restart home medications where appropriate.   I certify that inpatient services furnished can reasonably be expected to improve the patient's condition.  Mylan Schwarz,MD 01/28/2013, 10:18 AM

## 2013-01-28 NOTE — Progress Notes (Signed)
Pt paces the floor in her room  Making hand gestures and talking to people who arent there  She does not notice what is going on around her   She is repitative and moves in the same pathway as she paces around her room Pt on 1:1 for safety  Pt is safe at present

## 2013-01-28 NOTE — Clinical Social Work Note (Signed)
Met with [name] individually.  She states that she and her boyfriend are no longer together.  When asked who would pick her up at d/c, she said she had no one to call, and would need [place] to call her a cab.  I offered her a referral to an ACT team.  She declined.  Stated [facility] is the only place she cares to go.

## 2013-01-29 MED ORDER — WARFARIN - PHARMACIST DOSING INPATIENT
Freq: Every day | Status: DC
  Administered 2013-01-29 – 2013-02-01 (×3)
  Filled 2013-01-29 (×12): qty 1

## 2013-01-29 MED ORDER — WARFARIN SODIUM 10 MG PO TABS
10.0000 mg | ORAL_TABLET | Freq: Once | ORAL | Status: AC
  Administered 2013-01-29: 10 mg via ORAL
  Filled 2013-01-29: qty 1

## 2013-01-29 MED ORDER — DIPHENHYDRAMINE HCL 50 MG/ML IJ SOLN
25.0000 mg | Freq: Once | INTRAMUSCULAR | Status: DC
  Filled 2013-01-29: qty 0.5

## 2013-01-29 MED ORDER — RISPERIDONE MICROSPHERES 25 MG IM SUSR
25.0000 mg | INTRAMUSCULAR | Status: DC
  Filled 2013-01-29: qty 2

## 2013-01-29 NOTE — [facility] Counselor (Signed)
Adult Psychosocial Assessment Update Interdisciplinary Team  Previous [facility] admissions/discharges:  Admissions Discharges  Date: 12/22/12 Date:  Date:06/06/10 Date:  Date:03/22/10 Date:  Date: Date:  Date: Date:   Changes since the last Psychosocial Assessment (including adherence to outpatient mental health and/or substance abuse treatment, situational issues contributing to decompensation and/or relapse). [name] states that she and her boyfriend are no longer together, and she does not have a ride home. She states she does not want a referral to an ACT team, will not follow up at mental health "because you are wrong to think there is anything wrong with my mental health" and states she will follow up at [facility].  She is refusing all psychotropic medication, asks for her benzo regularly and is delusional.             Discharge Plan 1. Will you be returning to the same living situation after discharge?   Yes: X home No:      If no, what is your plan?           2. Would you like a referral for services when you are discharged? Yes:  X   If yes, for what services?  No:       Medical follow up at [facility]       Summary and Recommendations (to be completed by the evaluator) [name] is a 42 YO caucasian female with a significant and persistent mental illness who has limited insight and support.  She does not want enhanced services, or, for that matter, any psychiatric services.  She can benefit from crises stabilization, medication management, referral for services and therapeutic milieu.                       Signature:  Ida Rogue, 01/29/2013 10:34 AM

## 2013-01-29 NOTE — Progress Notes (Addendum)
1:1 Note  Patient currently in hallway walking with MHT present. Patient denies any concerns or questions at this time. Patient has been refusing all medications except valium. Patient states that she "doesn't need anything else." MD and NP made aware of patient's medication refusal. Patient remains 1:1 for fall risk. Will continue to monitor patient for safety.

## 2013-01-29 NOTE — Progress Notes (Signed)
**Note De-Identified Joy Obfuscation** [facility] MD Progress Note  01/29/2013 9:47 AM [name]  MRN:  [id number] Subjective:   Patient states "I'm just reporting what I know. I can't stand and talk to you. What do you want from me?" When the patient was asked if she was hearing voices replied "None that I am not supposed to." Patient asked MHT for clothes stating "I want clothes that do not have any acid on them."   Objective:  Patient observed walking in the halls talking to herself and making bizarre hand gestures. She remains psychotic, paranoid and very delusional. The patient has no insight into her mental illness and has been resistant to treatment while in the hospital.   Diagnosis:   DSM5: AXIS I: Chronic Paranoid Schizophrenia  PTSD by history, Marijuana Abuse  AXIS II: Deferred  AXIS III:  Past Medical History  Diagnosis Date  . Pulmonary emboli  . Schizophrenia  . Cancer  . Uterus cancer  . Anxiety  . Depression  AXIS IV: other psychosocial or environmental problems and problems related to social environment  AXIS V: 31-40 impairment in reality testing  ADL's:  Impaired  Sleep: Good  Appetite:  Fair  Suicidal Ideation:  Denies Homicidal Ideation:  Denies AEB (as evidenced by):  Psychiatric Specialty Exam: Review of Systems  Constitutional: Negative.   HENT: Negative.   Eyes: Negative.   Respiratory: Negative.   Cardiovascular: Negative.   Gastrointestinal: Negative.   Genitourinary: Negative.   Musculoskeletal: Positive for back pain.  Skin: Negative.   Neurological: Negative.   Endo/Heme/Allergies: Negative.   Psychiatric/Behavioral: Positive for depression and hallucinations. Negative for suicidal ideas, memory loss and substance abuse. The patient is nervous/anxious. The patient does not have insomnia.     Blood pressure 109/82, pulse 103, temperature 98 F (36.7 C), temperature source Oral, resp. rate 16, height 5\' 8"  (1.727 m), weight 68.04 kg (150 lb), last menstrual period  01/11/2013.Body mass index is 22.81 kg/(m^2).  General Appearance: Disheveled  Eye Solicitor::  Fair  Speech:  Clear and Coherent  Volume:  Decreased  Mood:  Anxious and Dysphoric  Affect:  Flat  Thought Process:  Disorganized  Orientation:  Full (Time, Place, and Person)  Thought Content:  Hallucinations: Auditory Visual  Suicidal Thoughts:  No  Homicidal Thoughts:  No  Memory:  Immediate;   Fair Recent;   Fair Remote;   Fair  Judgement:  Poor  Insight:  Lacking  Psychomotor Activity:  Increased  Concentration:  Fair  Recall:  Fair  Akathisia:  No  Handed:  Right  AIMS (if indicated):     Assets:  Communication Skills Desire for Improvement Housing Leisure Time Physical Health Resilience  Sleep:  Number of Hours: 6.75   Current Medications: Current Facility-Administered Medications  Medication Dose Route Frequency Provider Last Rate Last Dose  . acetaminophen (TYLENOL) tablet 650 mg  650 mg Oral Q6H PRN Mojeed Akintayo      . alum & mag hydroxide-simeth (MAALOX/MYLANTA) 200-200-20 MG/5ML suspension 30 mL  30 mL Oral Q4H PRN Shuvon Rankin, NP      . cyclobenzaprine (FLEXERIL) tablet 5 mg  5 mg Oral TID PRN Shuvon Rankin, NP   5 mg at 01/27/13 2211  . diazepam (VALIUM) tablet 5 mg  5 mg Oral TID Mojeed Akintayo   5 mg at 01/29/13 0739  . HYDROcodone-acetaminophen (NORCO) 10-325 MG per tablet 1 tablet  1 tablet Oral Q6H PRN Mojeed Akintayo   1 tablet at 01/29/13 0548  . magnesium hydroxide (MILK  OF MAGNESIA) suspension 30 mL  30 mL Oral Daily PRN Shuvon Rankin, NP      . nicotine (NICODERM CQ - dosed in mg/24 hours) patch 21 mg  21 mg Transdermal Q0600 Mojeed Akintayo   21 mg at 01/29/13 0553  . polycarbophil (FIBERCON) tablet 1,250 mg  1,250 mg Oral Daily Shuvon Rankin, NP   1,250 mg at 01/28/13 0814  . risperiDONE (RISPERDAL M-TABS) disintegrating tablet 0.5 mg  0.5 mg Oral BID Mojeed Akintayo      . [START ON 02/09/2013] risperiDONE microspheres (RISPERDAL CONSTA) injection  25 mg  25 mg Intramuscular Q14 Days Mojeed Akintayo      . Rivaroxaban (XARELTO) tablet 20 mg  20 mg Oral Daily Mojeed Akintayo      . traZODone (DESYREL) tablet 50 mg  50 mg Oral QHS PRN,MR X 1 Fransisca Kaufmann, NP        Lab Results:  Results for orders placed during the hospital encounter of 01/27/13 (from the past 48 hour(s))  PROTIME-INR     Status: Abnormal   Collection Time    01/28/13  6:20 AM      Result Value Range   Prothrombin Time 24.6 (*) 11.6 - 15.2 seconds   INR 2.31 (*) 0.00 - 1.49   Comment: Performed at [facility]    Physical Findings: AIMS: Facial and Oral Movements Muscles of Facial Expression: None, normal Lips and Perioral Area: None, normal Jaw: None, normal Tongue: None, normal,Extremity Movements Upper (arms, wrists, hands, fingers): None, normal Lower (legs, knees, ankles, toes): None, normal, Trunk Movements Neck, shoulders, hips: None, normal, Overall Severity Severity of abnormal movements (highest score from questions above): None, normal Incapacitation due to abnormal movements: None, normal Patient's awareness of abnormal movements (rate only patient's report): No Awareness, Dental Status Current problems with teeth and/or dentures?: No Does patient usually wear dentures?: No  CIWA:    COWS:     Treatment Plan Summary: Daily contact with patient to assess and evaluate symptoms and progress in treatment Medication management  Plan: Continue crisis management and stabilization.  Medication management: Patient refusing all oral antispsychotics at this time and xarelto. Patient ordered Risperdal Consta 25 mg to be given today and every fourteen days.  Encouraged patient to attend groups and participate in group counseling sessions and activities. Encourage patient to be compliant with prescribed medication regimen.  Discharge plan in progress.  Continue current treatment plan.  Conulted with PharmD Peggye Fothergill about restarting  patient on coumadin as she is willing to take this medication to prevent reoccurrence of PE.   Medical Decision Making Problem Points:  Established problem, stable/improving (1) and Review of psycho-social stressors (1) Data Points:  Review and summation of old records (2) Review of medication regiment & side effects (2)  I certify that inpatient services furnished can reasonably be expected to improve the patient's condition.   Harmony Sandell NP-C 01/29/2013, 9:47 AM

## 2013-01-29 NOTE — Tx Team (Addendum)
  Interdisciplinary Treatment Plan Update   Date Reviewed:  01/29/2013  Time Reviewed:  5:26 PM  Progress in Treatment:   Attending groups: No Participating in groups: No Taking medication as prescribed: Yes  Tolerating medication: Yes Family/Significant other contact made: Yes  Patient understands diagnosis: Yes  Discussing patient identified problems/goals with staff: Yes Medical problems stabilized or resolved: Yes Denies suicidal/homicidal ideation: Yes Patient has not harmed self or others: Yes  For review of initial/current patient goals, please see plan of care.  Estimated Length of Stay:  4-5 days  Reason for Continuation of Hospitalization: Medication stabilization Psychosis  New Problems/Goals identified:  N/A  Discharge Plan or Barriers:   return home, follow up outpt  Additional Comments:  [name] is now refusing all meds.  Previously, she was only refusing psychotropics.  This includes coumadin, which is essential for her medical well being.  She is also refusing labs.  Based on this, Dr Jannifer Franklin is involuntarily committing her and asking for a second opinion to force medications.  Attendees:  Signature: Thedore Mins, MD 01/29/2013 5:26 PM   Signature: Richelle Ito, LCSW 01/29/2013 5:26 PM  Signature: Fransisca Kaufmann, NP 01/29/2013 5:26 PM  Signature: Joslyn Devon, RN 01/29/2013 5:26 PM  Signature: Liborio Nixon, RN 01/29/2013 5:26 PM  Signature:  01/29/2013 5:26 PM  Signature:   01/29/2013 5:26 PM  Signature:    Signature:    Signature:    Signature:    Signature:    Signature:      Scribe for Treatment Team:   Richelle Ito, LCSW  01/29/2013 5:26 PM

## 2013-01-29 NOTE — Progress Notes (Signed)
Risperidone and Valium given tonight per verbal MD order. Pt. Pacing back and forth in [address] way responding to internal stimuli. Had been refusing all medications except narcotics throughout the day.

## 2013-01-29 NOTE — Progress Notes (Signed)
Pt has been up about 15-20 minutes.  Pt c/o back pain at this time.  She is pacing the [place].  Pt is not very talkative this morning and appears to be in obvious pain.  Pt was given (1) Norco at this time.  Pt voices no other needs this morning.  Continue 1:1 for safety.  Sitter with patient.  Pt remains safe.

## 2013-01-29 NOTE — Progress Notes (Signed)
D: Pt. Pacing up and down the [address] way responding to internal stimuli with hand gestures and constant inaudible talking and laughing. Pt. Stated that she thinks when she walks, so she is going to continue walking unitl she solves her problem. When writer asked what the problem was, pt stated " i don't know that's what I'm trying to figure out and i don't feel like talking about it." When writer asked who is causing the problems, pt stated " many things" Pt. Refused all medication during the day except narcotics. MD and [facility] notified, verbal orders given to give Risperidone and valium. Pt. Agreed to take medica ition as long as she was able to get hydrocodone afterwards. When giving medication pt. In the end still refused to take risperidone, but did stop pacing the [place]. Denies SI/HI and pain A: q 15 minute safety checks and pt. On 1:1. Support and encouragement given R: pt. Remains safe on the unit.

## 2013-01-29 NOTE — Progress Notes (Signed)
1:1 Note  Patient is pacing back in forth in [place] with Nurse. Pt. Is still delusional and responding to internal stimuli. Pt. Stated that she is trying to solve a problem, when asked what the problem was, pt. Stated " i dont know, that is what I am trying to find out." Pt. Remains safe on the unit. Q 15 minute safety checks

## 2013-01-29 NOTE — [facility] Group Notes (Signed)
[facility] Group Notes:  (Counselor/Nursing/MHT/Case Management/Adjunct)  01/29/2013 1:15PM  Type of Therapy:  Group Therapy  Participation Level: Did not attend    Summary of Progress/Problems: The topic for group was balance in life.  Pt participated in the discussion about when their life was in balance and out of balance and how this feels.  Pt discussed ways to get back in balance and short term goals they can work on to get where they want to be.    Daryel Gerald B 01/29/2013 3:04 PM

## 2013-01-29 NOTE — Progress Notes (Signed)
1:1 progress note:  Pt resting in bed with eyes closed.  No distress observed.  Respirations even/unlabored.  Continue 1:1 for safety d/t high fall risk.  Sitter at bedside.  Pt safe at this time.

## 2013-01-29 NOTE — Progress Notes (Signed)
1:1 Note  Patient currently in hallway with MHT present. Patient states that she is "fine" and that she "doesn't need anything." The patient denies any other concerns or complaints at this time. Will continue to monitor patient for safety.

## 2013-01-29 NOTE — Progress Notes (Signed)
ANTICOAGULATION CONSULT NOTE - Initial Consult  Pharmacy Consult for Coumadin Indication: H/O PE  Allergies  Allergen Reactions  . Darvocet [Propoxyphene-Acetaminophen] Nausea And Vomiting and Other (See Comments)    Shaking     Patient Measurements: Height: 5\' 8"  (172.7 cm) Weight: 150 lb (68.04 kg) IBW/kg (Calculated) : 63.9   Vital Signs: Temp: 98 F (36.7 C) (10/30 0800) Temp src: Oral (10/30 0800) BP: 109/82 mmHg (10/30 0801) Pulse Rate: 103 (10/30 0801)  Labs:  Recent Labs  01/28/13 0620  LABPROT 24.6*  INR 2.31*    Estimated Creatinine Clearance: 91.5 ml/min (by C-G formula based on Cr of 0.76).   Medical History: Past Medical History  Diagnosis Date  . Pulmonary emboli   . Schizophrenia   . Cancer   . Uterus cancer   . Anxiety   . Depression   . PTSD (post-traumatic stress disorder)     Medications:  Prescriptions prior to admission  Medication Sig Dispense Refill  . diazepam (VALIUM) 5 MG tablet Take 5 mg by mouth 3 (three) times daily.       Marland Kitchen HYDROcodone-acetaminophen (NORCO) 10-325 MG per tablet Take 1 tablet by mouth 4 (four) times daily.      Marland Kitchen warfarin (COUMADIN) 5 MG tablet Take 5-7.5 mg by mouth See admin instructions. 5 mg Monday-Saturday.  7.5 mg on Sunday        Assessment: Patient refused to take Xarelto which was started in ED in place of coumadin.  Patient is willing to take coumadin and comfortable with this.  Patient is psychotic at this time and agitated.   Patient has not had any coumadin or Xarelto at [facility] since admission.   Goal of Therapy:  INR 2-3    Plan:  Coumadin 10 mg PO x 1 today.  INR in am.  [name] 01/29/2013,1:40 PM

## 2013-01-29 NOTE — Progress Notes (Signed)
1:1 Note  Patient continues to walk in hallway with MHT. Patient denies any concerns or complaints at this time. Patient states that she is "happy to be back on warfarin." Patient remains disorganized and delusional. Patient states that energy will "save [place] all." Will continue to monitor patient q15 minutes for safety.

## 2013-01-30 MED ORDER — WARFARIN SODIUM 5 MG PO TABS
5.0000 mg | ORAL_TABLET | Freq: Once | ORAL | Status: AC
  Administered 2013-01-30: 5 mg via ORAL
  Filled 2013-01-30 (×2): qty 1

## 2013-01-30 MED ORDER — TRAZODONE HCL 100 MG PO TABS
100.0000 mg | ORAL_TABLET | Freq: Every evening | ORAL | Status: DC | PRN
  Administered 2013-01-31 – 2013-02-02 (×3): 100 mg via ORAL
  Filled 2013-01-30: qty 6
  Filled 2013-01-30 (×4): qty 1

## 2013-01-30 NOTE — Progress Notes (Signed)
Nursing 1:1 note D:Pt observed walking the halls, talking to herself and to someone not observed by Clinical research associate.  RR even and unlabored. No distress noted  A: 1:1 observation continues for safety . Asked pt if she was ok and wanted something to help calm her down, pt stated" I'm ok I slept last night" R: pt remains safe

## 2013-01-30 NOTE — Progress Notes (Signed)
Patient ID: [name], female   DOB: 1970-07-01, 42 y.o.   MRN: [id number] [facility] MD Progress Note  01/30/2013 11:01 AM [name]  MRN:  [id number] Subjective:  "I am not going to take any of your medications and you cannot force me." Objective: Patient with worsening psychosis and delusions who is refusing to take her medications. Her thought process and behavior are bizarre and disorganized. Patient is having difficulty sleeping, she paces her room and hallway taking to herself non-stop and making  bizarre hand gestures. She has no insight into her mental illness and has been resistant to treatment while in the hospital and could not understand why she has to be brought to the inpatient.  Diagnosis:   DSM5: AXIS I: Chronic Paranoid Schizophrenia  PTSD by history, Marijuana Abuse  AXIS II: Deferred  AXIS III:  Past Medical History  Diagnosis Date  . Pulmonary emboli  . Schizophrenia  . Cancer  . Uterus cancer  . Anxiety  . Depression  AXIS IV: other psychosocial or environmental problems and problems related to social environment  AXIS V: 31-40 impairment in reality testing  ADL's:  Impaired  Sleep: fair  Appetite:  Fair  Suicidal Ideation:  Denies Homicidal Ideation:  Denies AEB (as evidenced by):  Psychiatric Specialty Exam: Review of Systems  Constitutional: Negative.   HENT: Negative.   Eyes: Negative.   Respiratory: Negative.   Cardiovascular: Negative.   Gastrointestinal: Negative.   Genitourinary: Negative.   Musculoskeletal: Positive for back pain.  Skin: Negative.   Neurological: Negative.   Endo/Heme/Allergies: Negative.   Psychiatric/Behavioral: Positive for depression and hallucinations. Negative for suicidal ideas, memory loss and substance abuse. The patient is nervous/anxious. The patient does not have insomnia.     Blood pressure 104/74, pulse 87, temperature 97.8 F (36.6 C), temperature source Oral, resp. rate 16, height 5\' 8"  (1.727 m), weight  68.04 kg (150 lb), last menstrual period 01/11/2013.Body mass index is 22.81 kg/(m^2).  General Appearance: Disheveled  Eye Solicitor::  Fair  Speech:  Clear and Coherent  Volume:  Decreased  Mood:  Anxious and Dysphoric  Affect:  Flat  Thought Process:  Disorganized  Orientation:  Full (Time, Place, and Person)  Thought Content:  Hallucinations: Auditory Visual  Suicidal Thoughts:  No  Homicidal Thoughts:  No  Memory:  Immediate;   Fair Recent;   Fair Remote;   Fair  Judgement:  Poor  Insight:  Lacking  Psychomotor Activity:  Increased  Concentration:  Fair  Recall:  Fair  Akathisia:  No  Handed:  Right  AIMS (if indicated):     Assets:  Communication Skills Desire for Improvement Housing Leisure Time Physical Health Resilience  Sleep:  Number of Hours: 6.75   Current Medications: Current Facility-Administered Medications  Medication Dose Route Frequency Provider Last Rate Last Dose  . acetaminophen (TYLENOL) tablet 650 mg  650 mg Oral Q6H PRN Nyshawn Gowdy      . alum & mag hydroxide-simeth (MAALOX/MYLANTA) 200-200-20 MG/5ML suspension 30 mL  30 mL Oral Q4H PRN Shuvon Rankin, NP      . cyclobenzaprine (FLEXERIL) tablet 5 mg  5 mg Oral TID PRN Shuvon Rankin, NP   5 mg at 01/27/13 2211  . diazepam (VALIUM) tablet 5 mg  5 mg Oral TID Markita Stcharles   5 mg at 01/29/13 2128  . diphenhydrAMINE (BENADRYL) injection 25 mg  25 mg Intramuscular Once Ayan Heffington      . HYDROcodone-acetaminophen (NORCO) 10-325 MG per tablet  1 tablet  1 tablet Oral Q6H PRN Zelena Bushong   1 tablet at 01/30/13 0559  . magnesium hydroxide (MILK OF MAGNESIA) suspension 30 mL  30 mL Oral Daily PRN Shuvon Rankin, NP      . nicotine (NICODERM CQ - dosed in mg/24 hours) patch 21 mg  21 mg Transdermal Q0600 Embry Manrique   21 mg at 01/30/13 0639  . polycarbophil (FIBERCON) tablet 1,250 mg  1,250 mg Oral Daily Shuvon Rankin, NP   1,250 mg at 01/28/13 0814  . risperiDONE (RISPERDAL M-TABS)  disintegrating tablet 0.5 mg  0.5 mg Oral BID Liron Eissler   0.5 mg at 01/29/13 2128  . risperiDONE microspheres (RISPERDAL CONSTA) injection 25 mg  25 mg Intramuscular Q14 Days Andren Bethea      . traZODone (DESYREL) tablet 50 mg  50 mg Oral QHS PRN,MR X 1 Fransisca Kaufmann, NP   50 mg at 01/29/13 2128  . warfarin (COUMADIN) tablet 5 mg  5 mg Oral ONCE-1800 Malva Cogan, [facility]      . Warfarin - Pharmacist Dosing Inpatient   Does not apply q1800 Cheryn Lundquist        Lab Results:  No results found for this or any previous visit (from the past 48 hour(s)).  Physical Findings: AIMS: Facial and Oral Movements Muscles of Facial Expression: None, normal Lips and Perioral Area: None, normal Jaw: None, normal Tongue: None, normal,Extremity Movements Upper (arms, wrists, hands, fingers): None, normal Lower (legs, knees, ankles, toes): None, normal, Trunk Movements Neck, shoulders, hips: None, normal, Overall Severity Severity of abnormal movements (highest score from questions above): None, normal Incapacitation due to abnormal movements: None, normal Patient's awareness of abnormal movements (rate only patient's report): No Awareness, Dental Status Current problems with teeth and/or dentures?: No Does patient usually wear dentures?: No  CIWA:    COWS:     Treatment Plan Summary: Daily contact with patient to assess and evaluate symptoms and progress in treatment Medication management  Plan: Continue crisis management and stabilization.  Medication management: Patient refusing all oral antispsychotics at this time and xarelto. Patient ordered Risperdal Consta 25 mg to be given today and every fourteen days.  Encouraged patient to attend groups and participate in group counseling sessions and activities. Encourage patient to be compliant with prescribed medication regimen.  Discharge plan in progress.  Continue current treatment plan.  Conulted with PharmD Peggye Fothergill about restarting  patient on coumadin as she is willing to take this medication to prevent reoccurrence of PE.  Patient will be converted to involuntary status in order to get second opinion from another physician for forced medication.  Medical Decision Making Problem Points:  Established problem, worsening (2) and Review of psycho-social stressors (1) Data Points:  Review and summation of old records (2) Review of medication regiment & side effects (2)  I certify that inpatient services furnished can reasonably be expected to improve the patient's condition.   Thedore Mins, MD 01/30/2013, 11:01 AM

## 2013-01-30 NOTE — Progress Notes (Signed)
1:1 Note-Pt pacing in halls, steady gait, denies SI/HI/AVH at this time but still responding to internal stimuli, pt avoids interaction, flat depressed affect, sitter with pt, 1:1 observation continued for safety/fall risk.

## 2013-01-30 NOTE — Progress Notes (Signed)
[facility] Adult Case Management Discharge Plan :  Will you be returning to the same living situation after discharge: Yes,  home At discharge, do you have transportation home?:Yes,  cab Do you have the ability to pay for your medications:Yes,  MCD  Release of information consent forms completed and in the chart;  Patient's signature needed at discharge.  Patient to Follow up at: Follow-up Information   Follow up with [facility]. (Walk-in M-F between 8 and 9:30AM for your hospital follow up appointment)       Patient denies SI/HI:   Yes,  yes    Safety Planning and Suicide Prevention discussed:  Yes,  yes  Ida Rogue 01/30/2013, 8:38 AM

## 2013-01-30 NOTE — Progress Notes (Signed)
D: Pt denies SI/HI/AVH. Pt is argumentative, delusional, and confrontational . Pt told Clinical research associate that someone gave her liquid valium in her milk today. Pt did not want to talk to Clinical research associate when Clinical research associate informed pt that what she was saying was not making sense. Pt then proceeded to become angry and tell writer that I was the one that did it.    A: Pt was offered support and encouragement.  Pt was encourage to attend groups. Q 15 minute checks were done for safety.  R: Pt  Not receptive to treatment and safety maintained on unit.

## 2013-01-30 NOTE — [facility] Group Notes (Signed)
[facility] LCSW Group Therapy  01/30/2013  1:05 PM  Type of Therapy:  Group therapy  Participation Level:  Did not attend    Summary of Progress/Problems:  Chaplain was here to lead a group on themes of hope and courage.  Daryel Gerald B 01/30/2013 1:28 PM

## 2013-01-30 NOTE — Progress Notes (Signed)
1:1 Note- Pt sitting quietly in bed, feet elevated d/t being swollen from her pacing so much the past couple of days, NP notified of lt foot swelling and suggested pt rest feet and lay down with feet elevated on pillows, pt cooperative, calm, no distress at this time, sitter at bedside, 1:1 observation continued for safety.

## 2013-01-30 NOTE — Progress Notes (Signed)
ANTICOAGULATION CONSULT NOTE - Follow Up Consult  Pharmacy Consult for Coumadin  Indication: History of PE  Allergies  Allergen Reactions  . Darvocet [Propoxyphene-Acetaminophen] Nausea And Vomiting and Other (See Comments)    Shaking     Patient Measurements: Height: 5\' 8"  (172.7 cm) Weight: 150 lb (68.04 kg) IBW/kg (Calculated) : 63.9 Heparin Dosing Weight:   Vital Signs: Temp: 97.8 F (36.6 C) (10/31 0700) BP: 104/74 mmHg (10/31 0700) Pulse Rate: 87 (10/31 0700)  Labs:  Recent Labs  01/28/13 0620  LABPROT 24.6*  INR 2.31*    Estimated Creatinine Clearance: 91.5 ml/min (by C-G formula based on Cr of 0.76).   Medications:  Scheduled:  . diazepam  5 mg Oral TID  . diphenhydrAMINE  25 mg Intramuscular Once  . nicotine  21 mg Transdermal Q0600  . polycarbophil  1,250 mg Oral Daily  . risperiDONE  0.5 mg Oral BID  . risperiDONE microspheres  25 mg Intramuscular Q14 Days  . warfarin  5 mg Oral ONCE-1800  . Warfarin - Pharmacist Dosing Inpatient   Does not apply q1800    Assessment:  Patient refused blood draw this morning.  She took her 10 mg dose of Coumadin last night.  Had not had a dose of Coumadin in a couple of days.  Goal of Therapy:  INR 2-3    Plan:  Will give coumadin 5 mg today (her home dose) and try to get an INR in AM.  [name] L 01/30/2013,8:57 AM

## 2013-01-30 NOTE — Progress Notes (Addendum)
Pt sitter requested that pt needed something for pain, pt was asked if she wanted something for pain and pt stated she did. Flexeril and Tylenol was pulled out the pyxis for pt. Pt took the medication spit it back in the cup and said "I changed my mind and walked away from the medication window. Medication had to be wasted.

## 2013-01-30 NOTE — Progress Notes (Signed)
1:1 Note- Pt still pacing halls, in and out of room, denies SI/HI/AVH, pt didn't sleep at all last night, talked with NP about pt's inability to sleep d/t having sitter and roommate with sitter, pt was up pacing all night, no roommate order obtained.  1:1 observation continued for safety.

## 2013-01-30 NOTE — Progress Notes (Signed)
Nursing 1:1 note D:Pt observed in dayroom . RR even and unlabored. No distress noted. A: 1:1 observation continues for safety  R: pt remains safe  

## 2013-01-30 NOTE — Progress Notes (Signed)
Nursing 1:1 note D:Pt observed lying in bed with eyes closed. RR even and unlabored. No distress noted. A: 1:1 observation continues for safety  R: pt remains safe  

## 2013-01-30 NOTE — Progress Notes (Signed)
Seen and agreed. Hughie Melroy, MD 

## 2013-01-30 NOTE — Progress Notes (Signed)
Adult Psychoeducational Group Note  Date:  01/30/2013 Time:  11:44 AM  Group Topic/Focus:  Relapse Prevention Planning:   The focus of this group is to define relapse and discuss the need for planning to combat relapse.  Participation Level:  Minimal  Participation Quality:  Appropriate, Sharing and Supportive  Affect:  Appropriate  Cognitive:  Appropriate  Insight: Appropriate  Engagement in Group:  Limited  Modes of Intervention:  Discussion, Education, Problem-solving and Support  Additional Comments: pt attended group.  Isla Pence M 01/30/2013, 11:44 AM

## 2013-01-30 NOTE — Progress Notes (Signed)
Psychoeducational Group Note  Date:  01/30/2013 Time:  2000  Group Topic/Focus:  Wrap-Up Group:   The focus of this group is to help patients review their daily goal of treatment and discuss progress on daily workbooks.  Participation Level: Did Not Attend  Participation Quality:  Not Applicable  Affect:  Not Applicable  Cognitive:  Not Applicable  Insight:  Not Applicable  Engagement in Group: Not Applicable  Additional Comments:  The patient did not attend group since she was sitting in her room.   Hazle Coca S 01/30/2013, 9:54 PM

## 2013-01-31 MED ORDER — WARFARIN SODIUM 7.5 MG PO TABS
7.5000 mg | ORAL_TABLET | Freq: Once | ORAL | Status: AC
  Administered 2013-01-31: 7.5 mg via ORAL
  Filled 2013-01-31: qty 1

## 2013-01-31 MED ORDER — CIPROFLOXACIN HCL 500 MG PO TABS
500.0000 mg | ORAL_TABLET | Freq: Two times a day (BID) | ORAL | Status: DC
  Administered 2013-01-31 – 2013-02-05 (×10): 500 mg via ORAL
  Filled 2013-01-31 (×13): qty 1

## 2013-01-31 NOTE — Progress Notes (Addendum)
Pt is pacing up and down the [place] holding her vaginal area. She stated she has pain there and it hurts when she voids. MD made ware. Pt stated she would not give [place] a urine sample.Pt remains a 1:1 for safety. She appears to be responding to internal stimuli. Her depression is a 8/10 and her hopelessness is a 0/10. Pt would like to know when she will be off the 1:1. Pt does contract for safety and denies SI or HI. 3:45pm-Pt remains a 1:1 and NP made aware of pts urinary frequency . Pt was placed on cipro and instructed to drink plenty of water. She continues to respond to internal stimuli ad at times is seen laughing as she paces up and down the [place]. 10am-pt took a bath -tolerated well. Pt. Decided she would take 100mg  of trazadone for sleep. Pt keeps itching in her vaginal area. Asked pt is she felt she had a yeast infection and pt denied this.

## 2013-01-31 NOTE — Progress Notes (Signed)
Nursing 1:1 note D:Pt observed sleeping in bed with eyes closed. RR even and unlabored. No distress noted. A: 1:1 observation continues for safety  R: pt remains safe  

## 2013-01-31 NOTE — Progress Notes (Signed)
ANTICOAGULATION CONSULT NOTE - Follow Up Consult  Pharmacy Consult for Coumadin Indication: History of PE  Allergies  Allergen Reactions  . Darvocet [Propoxyphene-Acetaminophen] Nausea And Vomiting and Other (See Comments)    Shaking     Patient Measurements: Height: 5\' 8"  (172.7 cm) Weight: 150 lb (68.04 kg) IBW/kg (Calculated) : 63.9 Heparin Dosing Weight:   Vital Signs:    Labs:  Recent Labs  01/31/13 0652  LABPROT 20.0*  INR 1.76*    Estimated Creatinine Clearance: 91.5 ml/min (by C-G formula based on Cr of 0.76).   Medications:  Scheduled:  . diazepam  5 mg Oral TID  . diphenhydrAMINE  25 mg Intramuscular Once  . nicotine  21 mg Transdermal Q0600  . polycarbophil  1,250 mg Oral Daily  . risperiDONE  0.5 mg Oral BID  . risperiDONE microspheres  25 mg Intramuscular Q14 Days  . warfarin  7.5 mg Oral ONCE-1800  . Warfarin - Pharmacist Dosing Inpatient   Does not apply q1800    Assessment: Patients INR dropped after a 5 mg dose of Coumadin last night.  Goal of Therapy:  INR 2-3    Plan:  Will give a 7.5 mg dose of Coumadin tonight and recheck INR in AM  Pamala Duffel L 01/31/2013,7:59 AM

## 2013-01-31 NOTE — Progress Notes (Signed)
The focus of this group is to help patients review their daily goal of treatment and discuss progress on daily workbooks. Pt attended the evening group session but responded minimally to discussion prompts from the Writer. Pt responded that she had a good day on the unit when asked by the Writer, saying "I was able to pray today." Pt also shared that upon discharge she planned to take better care of herself by "living the life I'm supposed to live," but would not elaborate upon this. Pt appeared distracted by internal stimuli and was observed talking and swatting to imagined people/things throughout group.

## 2013-01-31 NOTE — Progress Notes (Signed)
Patient ID: [name], female   DOB: 1970-05-23, 42 y.o.   MRN: [id number]  1:1 Note  D: Pt has been pacing the [place] all morning and continues to pace the halls and not respond to staff's feedback. Pt appears to be responding to internal stimuli while pacing the halls. This writer attempted to give patient her morning medication, pt reported that she would only take her valium. Pt initially took her valium and after some coach and counseling from this writer she took her Risperdal M-Tab. After she took her Risperdal, pt reported that this medication was going to kill her. Pt then walked away, and started to pace the halls again. A: Pt's 1:1 continued for patient safety. R: Pt's safety maintained.

## 2013-01-31 NOTE — Progress Notes (Signed)
[facility] MD Progress Note  01/31/2013 9:23 AM [name]  MRN:  [id number]  Subjective: I don't have any mental illness.  Objective: Patient seen chart reviewed.  Patient remains very disorganized labile and refused to take medication last night.  This morning after some encouragement she was able to take Risperdal.  She does not believe that she has a psychiatric illness.  She continues to have delusion, paranoid thinking bizarre behavior.  She was seen making gesture and talking to herself.  She remains on one-to-one for disorganized behavior.  She has no tremors or shakes.  She has trouble sleeping.  She paces the hallway and difficult to engage in the conversation.  Diagnosis:   DSM5: AXIS I: Chronic Paranoid Schizophrenia  PTSD by history, Marijuana Abuse  AXIS II: Deferred  AXIS III:  Past Medical History  Diagnosis Date  . Pulmonary emboli  . Schizophrenia  . Cancer  . Uterus cancer  . Anxiety  . Depression  AXIS IV: other psychosocial or environmental problems and problems related to social environment  AXIS V: 31-40 impairment in reality testing  ADL's:  Impaired  Sleep: fair  Appetite:  Fair  Suicidal Ideation:  Denies Homicidal Ideation:  Denies AEB (as evidenced by):  Psychiatric Specialty Exam: Review of Systems  Constitutional: Negative.   HENT: Negative.   Eyes: Negative.   Respiratory: Negative.   Cardiovascular: Negative.   Gastrointestinal: Negative.   Genitourinary: Negative.   Musculoskeletal: Positive for back pain.  Skin: Negative.   Neurological: Negative.   Endo/Heme/Allergies: Negative.   Psychiatric/Behavioral: Positive for depression and hallucinations. Negative for suicidal ideas, memory loss and substance abuse. The patient is nervous/anxious. The patient does not have insomnia.     Blood pressure 97/67, pulse 108, temperature 97.9 F (36.6 C), temperature source Oral, resp. rate 16, height 5\' 8"  (1.727 m), weight 68.04 kg (150 lb), last  menstrual period 01/11/2013.Body mass index is 22.81 kg/(m^2).  General Appearance: Disheveled  Eye Solicitor::  Fair  Speech:  Clear and Coherent  Volume:  Decreased  Mood:  Anxious and Dysphoric  Affect:  Flat  Thought Process:  Disorganized  Orientation:  Full (Time, Place, and Person)  Thought Content:  Hallucinations: Auditory Visual  Suicidal Thoughts:  No  Homicidal Thoughts:  No  Memory:  Immediate;   Fair Recent;   Fair Remote;   Fair  Judgement:  Poor  Insight:  Lacking  Psychomotor Activity:  Increased  Concentration:  Fair  Recall:  Fair  Akathisia:  No  Handed:  Right  AIMS (if indicated):     Assets:  Communication Skills Desire for Improvement Housing Leisure Time Physical Health Resilience  Sleep:  Number of Hours: 6.75   Current Medications: Current Facility-Administered Medications  Medication Dose Route Frequency Provider Last Rate Last Dose  . acetaminophen (TYLENOL) tablet 650 mg  650 mg Oral Q6H PRN Mojeed Akintayo      . alum & mag hydroxide-simeth (MAALOX/MYLANTA) 200-200-20 MG/5ML suspension 30 mL  30 mL Oral Q4H PRN Shuvon Rankin, NP      . cyclobenzaprine (FLEXERIL) tablet 5 mg  5 mg Oral TID PRN Shuvon Rankin, NP   5 mg at 01/27/13 2211  . diazepam (VALIUM) tablet 5 mg  5 mg Oral TID Mojeed Akintayo   5 mg at 01/31/13 0815  . diphenhydrAMINE (BENADRYL) injection 25 mg  25 mg Intramuscular Once Mojeed Akintayo      . HYDROcodone-acetaminophen (NORCO) 10-325 MG per tablet 1 tablet  1 tablet Oral  Q6H PRN Mojeed Akintayo   1 tablet at 01/30/13 0559  . magnesium hydroxide (MILK OF MAGNESIA) suspension 30 mL  30 mL Oral Daily PRN Shuvon Rankin, NP      . nicotine (NICODERM CQ - dosed in mg/24 hours) patch 21 mg  21 mg Transdermal Q0600 Mojeed Akintayo   21 mg at 01/30/13 0639  . polycarbophil (FIBERCON) tablet 1,250 mg  1,250 mg Oral Daily Shuvon Rankin, NP   1,250 mg at 01/28/13 0814  . risperiDONE (RISPERDAL M-TABS) disintegrating tablet 0.5 mg  0.5  mg Oral BID Mojeed Akintayo   0.5 mg at 01/31/13 0815  . risperiDONE microspheres (RISPERDAL CONSTA) injection 25 mg  25 mg Intramuscular Q14 Days Mojeed Akintayo      . traZODone (DESYREL) tablet 100 mg  100 mg Oral QHS PRN,MR X 1 Fransisca Kaufmann, NP      . warfarin (COUMADIN) tablet 7.5 mg  7.5 mg Oral ONCE-1800 Malva Cogan, [facility]      . Warfarin - Pharmacist Dosing Inpatient   Does not apply q1800 Mojeed Akintayo        Lab Results:  Results for orders placed during the hospital encounter of 01/27/13 (from the past 48 hour(s))  PROTIME-INR     Status: Abnormal   Collection Time    01/31/13  6:52 AM      Result Value Range   Prothrombin Time 20.0 (*) 11.6 - 15.2 seconds   INR 1.76 (*) 0.00 - 1.49   Comment: Performed at [facility]    Physical Findings: AIMS: Facial and Oral Movements Muscles of Facial Expression: None, normal Lips and Perioral Area: None, normal Jaw: None, normal Tongue: None, normal,Extremity Movements Upper (arms, wrists, hands, fingers): None, normal Lower (legs, knees, ankles, toes): None, normal, Trunk Movements Neck, shoulders, hips: None, normal, Overall Severity Severity of abnormal movements (highest score from questions above): None, normal Incapacitation due to abnormal movements: None, normal Patient's awareness of abnormal movements (rate only patient's report): No Awareness, Dental Status Current problems with teeth and/or dentures?: No Does patient usually wear dentures?: No  CIWA:    COWS:     Treatment Plan Summary: Daily contact with patient to assess and evaluate symptoms and progress in treatment Medication management  Plan: Continue crisis management and stabilization.  Medication management: She still requires a lot of encouragement for the medication.  She takes the medication on and off.  She is refusing Risperdal Consta which was prescribed yesterday.  This morning she took by mouth resperidol. We will continue to  encourage taking the medication.   Encouraged patient to attend groups and participate in group counseling sessions and activities. Encourage patient to be compliant with prescribed medication regimen.  Patient was converted from voluntary to involuntary status .    Continue current treatment plan.  Conulted with PharmD Peggye Fothergill about restarting patient on coumadin as she is willing to take this medication to prevent reoccurrence of PE.   Medical Decision Making Problem Points:  Established problem, worsening (2) and Review of psycho-social stressors (1) Data Points:  Review and summation of old records (2) Review of medication regiment & side effects (2)  I certify that inpatient services furnished can reasonably be expected to improve the patient's condition.   ARFEEN,SYED T., MD 01/31/2013, 9:23 AM

## 2013-02-01 LAB — PROTIME-INR
INR: 1.77 — ABNORMAL HIGH (ref 0.00–1.49)
Prothrombin Time: 20.1 seconds — ABNORMAL HIGH (ref 11.6–15.2)

## 2013-02-01 MED ORDER — WARFARIN SODIUM 7.5 MG PO TABS
7.5000 mg | ORAL_TABLET | Freq: Once | ORAL | Status: AC
  Administered 2013-02-01: 7.5 mg via ORAL
  Filled 2013-02-01: qty 1

## 2013-02-01 MED ORDER — RISPERIDONE 1 MG PO TBDP
1.0000 mg | ORAL_TABLET | Freq: Two times a day (BID) | ORAL | Status: DC
  Administered 2013-02-01 – 2013-02-03 (×4): 1 mg via ORAL
  Filled 2013-02-01 (×6): qty 1

## 2013-02-01 NOTE — Progress Notes (Signed)
Pt was very willing to take her ma meds this morning.She continues to respond to internal stimuli and paces the hallway and her bedroom floor. Pt points as she is talking to herself and constantly has her hand on her vaginal area . Pt remains a 1:1 for safety. Denies SI or HI . Pt has a very good appetite and ate 100% of her breakfast.

## 2013-02-01 NOTE — Progress Notes (Signed)
Patient ID: [name], female   DOB: 1970/12/25, 42 y.o.   MRN: [id number]   Pt laying in bed resting with eyes closed. Respirations even and unlabored. No distress noted.

## 2013-02-01 NOTE — [facility] Group Notes (Signed)
[facility] Group Notes:  (Nursing/MHT/Case Management/Adjunct)  Date:  02/01/2013  Time:  10:54 AM  Type of Therapy:  Psychoeducational Skills  Participation Level:  Minimal  Participation Quality:  Resistant  Affect:  Resistant  Cognitive:  Lacking  Insight:  Lacking  Engagement in Group:  None  Modes of Intervention:  Support  Summary of Progress/Problems:Pt paced the floor and did not respond when nurse asked questions.  Rodman Key [facility] 02/01/2013, 10:54 AM

## 2013-02-01 NOTE — Progress Notes (Signed)
Pt currently resting in bed on her right side where she has been for the last 1-2 hours. Observed mumbling to herself. When asked about whether she's hearing voices pt responds, "only the ones I'm supposed to." This Clinical research associate probed further and pt admits to hearing God's voice as well as voices from heaven. Medicated per orders. Support, encouragement given. 1:1 obs in place for safety. Pt made bizarre hand gestures when she took her trazadone which she initially refused. Will reassess for effectiveness. Pt remains safe. Lawrence Marseilles

## 2013-02-01 NOTE — [facility] Group Notes (Signed)
[facility] Group Notes: (Clinical Social Work)   02/01/2013      Type of Therapy:  Group Therapy   Participation Level:  Did Not Attend    Ambrose Mantle, LCSW 02/01/2013, 12:32 PM

## 2013-02-01 NOTE — Progress Notes (Signed)
1:1 q4 hours progress note -Pt is pacing in her room and up and down the hallway, cant seem to stand still, pt sts that she is ready to go home, sts that she lives alone -Pt is talking to herself and making several hand gestures; according to tech pt will not eat or use the restroom unless instructed to, pt became tearful at one point but refused to talk about what upset her -Pt is alert and oriented, denies pain

## 2013-02-01 NOTE — Progress Notes (Signed)
Patient ID: [name], female   DOB: 1971-03-15, 42 y.o.   MRN: [id number]  D: Pt continues to pace, pluck and pull unseen objects out of the air. However, when asked pt denied A/V. Pt did complain of a headache, and informed the writer that she get hydrocodone.   A: Pt was given Norco at 0659. Pt is continued on the 1:1 for safety  R: Pt remains safe.

## 2013-02-01 NOTE — Progress Notes (Signed)
1:1 progress note -pt is pacing in her room and down the hallway, alert & oriented, denies pain -pt has a good appetite and ate most of her lunch and dinner today -pt sts she slept well last pm -no change since lunch time today

## 2013-02-01 NOTE — Progress Notes (Signed)
Psychoeducational Group Note  Date:  02/01/2013 Time:  8:00p.m.   Group Topic/Focus:  Wrap-Up Group:   The focus of this group is to help patients review their daily goal of treatment and discuss progress on daily workbooks.  Participation Level: Did Not Attend  Participation Quality:  Not Applicable  Affect:  Not Applicable  Cognitive:  Not Applicable  Insight:  Not Applicable  Engagement in Group: Not Applicable  Additional Comments:  The patient did not attend group since she was in her room.   Hazle Coca S 02/01/2013, 9:02 PM

## 2013-02-01 NOTE — Progress Notes (Signed)
[facility] MD Progress Note  02/01/2013 9:28 AM [name]  MRN:  [id number]  Subjective: I am taking the medication.    Objective: Patient seen chart reviewed.  Patient is taking oral Risperdal at bedtime.  However she remains disorganized labile and seen talking to herself.  She continues to be in denial that she has any psychiatric illness.  She is taking by mouth medicine because she does not want any injection.  As per staff she slept well.  She still have delusions and paranoid thinking.  She was seen making gesture and it appears that she is talking to herself.  Patient does not have any tremors or shakes.  She she usually paces in her room while talking to herself. She remains on one-to-one for disorganized behavior.    Diagnosis:   DSM5: AXIS I: Chronic Paranoid Schizophrenia  PTSD by history, Marijuana Abuse  AXIS II: Deferred  AXIS III:  Past Medical History  Diagnosis Date  . Pulmonary emboli  . Schizophrenia  . Cancer  . Uterus cancer  . Anxiety  . Depression  AXIS IV: other psychosocial or environmental problems and problems related to social environment  AXIS V: 31-40 impairment in reality testing  ADL's:  Impaired  Sleep: fair  Appetite:  Fair  Suicidal Ideation:  Denies Homicidal Ideation:  Denies AEB (as evidenced by):  Psychiatric Specialty Exam: Review of Systems  Constitutional: Negative.   HENT: Negative.   Eyes: Negative.   Respiratory: Negative.   Cardiovascular: Negative.   Gastrointestinal: Negative.   Genitourinary: Negative.   Musculoskeletal: Positive for back pain.  Skin: Negative.   Neurological: Negative.   Endo/Heme/Allergies: Negative.   Psychiatric/Behavioral: Positive for depression and hallucinations. Negative for suicidal ideas, memory loss and substance abuse. The patient is nervous/anxious. The patient does not have insomnia.     Blood pressure 123/80, pulse 114, temperature 98.2 F (36.8 C), temperature source Oral, resp. rate  16, height 5\' 8"  (1.727 m), weight 68.04 kg (150 lb), last menstrual period 01/11/2013.Body mass index is 22.81 kg/(m^2).  General Appearance: Disheveled  Eye Solicitor::  Fair  Speech:  Clear and Coherent  Volume:  Decreased  Mood:  Anxious and Dysphoric  Affect:  Flat  Thought Process:  Disorganized  Orientation:  Full (Time, Place, and Person)  Thought Content:  Hallucinations: Auditory Visual  Suicidal Thoughts:  No  Homicidal Thoughts:  No  Memory:  Immediate;   Fair Recent;   Fair Remote;   Fair  Judgement:  Poor  Insight:  Lacking  Psychomotor Activity:  Increased  Concentration:  Fair  Recall:  Fair  Akathisia:  No  Handed:  Right  AIMS (if indicated):     Assets:  Communication Skills Desire for Improvement Housing Leisure Time Physical Health Resilience  Sleep:  Number of Hours: 7.25   Current Medications: Current Facility-Administered Medications  Medication Dose Route Frequency Provider Last Rate Last Dose  . acetaminophen (TYLENOL) tablet 650 mg  650 mg Oral Q6H PRN Mojeed Akintayo      . alum & mag hydroxide-simeth (MAALOX/MYLANTA) 200-200-20 MG/5ML suspension 30 mL  30 mL Oral Q4H PRN Shuvon Rankin, NP      . ciprofloxacin (CIPRO) tablet 500 mg  500 mg Oral BID Nanine Means, NP   500 mg at 02/01/13 0741  . cyclobenzaprine (FLEXERIL) tablet 5 mg  5 mg Oral TID PRN Shuvon Rankin, NP   5 mg at 01/31/13 1041  . diazepam (VALIUM) tablet 5 mg  5 mg Oral TID  Mojeed Akintayo   5 mg at 02/01/13 0741  . diphenhydrAMINE (BENADRYL) injection 25 mg  25 mg Intramuscular Once Mojeed Akintayo      . HYDROcodone-acetaminophen (NORCO) 10-325 MG per tablet 1 tablet  1 tablet Oral Q6H PRN Mojeed Akintayo   1 tablet at 02/01/13 0655  . magnesium hydroxide (MILK OF MAGNESIA) suspension 30 mL  30 mL Oral Daily PRN Shuvon Rankin, NP      . nicotine (NICODERM CQ - dosed in mg/24 hours) patch 21 mg  21 mg Transdermal Q0600 Mojeed Akintayo   21 mg at 02/01/13 0633  . polycarbophil  (FIBERCON) tablet 1,250 mg  1,250 mg Oral Daily Shuvon Rankin, NP   1,250 mg at 01/28/13 0814  . risperiDONE (RISPERDAL M-TABS) disintegrating tablet 1 mg  1 mg Oral BID Cleotis Nipper, MD      . risperiDONE microspheres (RISPERDAL CONSTA) injection 25 mg  25 mg Intramuscular Q14 Days Mojeed Akintayo      . traZODone (DESYREL) tablet 100 mg  100 mg Oral QHS PRN,MR X 1 Fransisca Kaufmann, NP   100 mg at 01/31/13 2251  . Warfarin - Pharmacist Dosing Inpatient   Does not apply q1800 Mojeed Akintayo        Lab Results:  Results for orders placed during the hospital encounter of 01/27/13 (from the past 48 hour(s))  PROTIME-INR     Status: Abnormal   Collection Time    01/31/13  6:52 AM      Result Value Range   Prothrombin Time 20.0 (*) 11.6 - 15.2 seconds   INR 1.76 (*) 0.00 - 1.49   Comment: Performed at [facility]  PROTIME-INR     Status: Abnormal   Collection Time    02/01/13  6:33 AM      Result Value Range   Prothrombin Time 20.1 (*) 11.6 - 15.2 seconds   INR 1.77 (*) 0.00 - 1.49   Comment: Performed at [facility]    Physical Findings: AIMS: Facial and Oral Movements Muscles of Facial Expression: None, normal Lips and Perioral Area: None, normal Jaw: None, normal Tongue: None, normal,Extremity Movements Upper (arms, wrists, hands, fingers): None, normal Lower (legs, knees, ankles, toes): None, normal, Trunk Movements Neck, shoulders, hips: None, normal, Overall Severity Severity of abnormal movements (highest score from questions above): None, normal Incapacitation due to abnormal movements: None, normal Patient's awareness of abnormal movements (rate only patient's report): No Awareness, Dental Status Current problems with teeth and/or dentures?: No Does patient usually wear dentures?: No  CIWA:    COWS:     Treatment Plan Summary: Daily contact with patient to assess and evaluate symptoms and progress in treatment Medication  management  Plan: Continue crisis management and stabilization.  Medication management: She still requires a lot of encouragement for the medication.  I will increase his Risperdal to 1 mg twice a day to target the paranoia and psychosis.  Encouraged patient to attend groups and participate in group counseling sessions and activities. Encourage patient to be compliant with prescribed medication regimen.  Continue current treatment plan.  Conulted with PharmD Peggye Fothergill about restarting patient on coumadin as she is willing to take this medication to prevent reoccurrence of PE.   Medical Decision Making Problem Points:  Established problem, worsening (2) and Review of psycho-social stressors (1) Data Points:  Review or order clinical lab tests (1) Review of medication regiment & side effects (2) Review of new medications or change in dosage (  2)  I certify that inpatient services furnished can reasonably be expected to improve the patient's condition.   Ahnika Hannibal T., MD 02/01/2013, 9:28 AM

## 2013-02-01 NOTE — Progress Notes (Signed)
ANTICOAGULATION CONSULT NOTE - Follow Up Consult  Pharmacy Consult for Coumadin Indication: History of PE  Allergies  Allergen Reactions  . Darvocet [Propoxyphene-Acetaminophen] Nausea And Vomiting and Other (See Comments)    Shaking     Patient Measurements: Height: 5\' 8"  (172.7 cm) Weight: 150 lb (68.04 kg) IBW/kg (Calculated) : 63.9 Heparin Dosing Weight:   Vital Signs: Temp: 98.2 F (36.8 C) (11/02 0600) BP: 123/80 mmHg (11/02 0600) Pulse Rate: 114 (11/02 0600)  Labs:  Recent Labs  01/31/13 0652 02/01/13 0633  LABPROT 20.0* 20.1*  INR 1.76* 1.77*    Estimated Creatinine Clearance: 91.5 ml/min (by C-G formula based on Cr of 0.76).   Medications:  Scheduled:  . ciprofloxacin  500 mg Oral BID  . diazepam  5 mg Oral TID  . diphenhydrAMINE  25 mg Intramuscular Once  . nicotine  21 mg Transdermal Q0600  . polycarbophil  1,250 mg Oral Daily  . risperiDONE  1 mg Oral BID  . risperiDONE microspheres  25 mg Intramuscular Q14 Days  . warfarin  7.5 mg Oral ONCE-1800  . Warfarin - Pharmacist Dosing Inpatient   Does not apply q1800    Assessment: Patient INR was only 1.77.  Goal of Therapy:  INR 2-3    Plan:  Will give another Coumadin 7.5 mg dose today and recheck INR in AM  Pamala Duffel L 02/01/2013,9:53 AM

## 2013-02-02 LAB — PROTIME-INR
INR: 2.33 — ABNORMAL HIGH (ref 0.00–1.49)
Prothrombin Time: 24.8 seconds — ABNORMAL HIGH (ref 11.6–15.2)

## 2013-02-02 MED ORDER — WARFARIN SODIUM 5 MG PO TABS
5.0000 mg | ORAL_TABLET | Freq: Every day | ORAL | Status: AC
  Administered 2013-02-02 – 2013-02-03 (×2): 5 mg via ORAL
  Filled 2013-02-02 (×2): qty 1

## 2013-02-02 NOTE — Progress Notes (Signed)
Patient ID: [name], female   DOB: 10-Jun-1970, 42 y.o.   MRN: [id number] [facility] MD Progress Note  02/02/2013 11:11 AM [name]  MRN:  [id number]  Subjective: "I have been taking my medications and feeling less agitated and anxious."   Objective: Patient has been compliant with her medications for the past two days. However, patient remains paranoid, delusional and still maintain that she has a telepathic/psychic power. She continues to pace in their room and talks to herself. Patient denies any adverse reactions to her medications. She she usually paces in her room while talking to herself. She remains on one-to-one for disorganized behavior and fall. Diagnosis:   DSM5: AXIS I: Chronic Paranoid Schizophrenia  PTSD by history, Marijuana Abuse  AXIS II: Deferred  AXIS III:  Past Medical History  Diagnosis Date  . Pulmonary emboli  . Cancer  . Uterus cancer  AXIS IV: other psychosocial or environmental problems and problems related to social environment  AXIS V: 31-40 impairment in reality testing  ADL's:  Impaired  Sleep: fair  Appetite:  Fair  Suicidal Ideation:  Denies Homicidal Ideation:  Denies AEB (as evidenced by):  Psychiatric Specialty Exam: Review of Systems  Constitutional: Negative.   HENT: Negative.   Eyes: Negative.   Respiratory: Negative.   Cardiovascular: Negative.   Gastrointestinal: Negative.   Genitourinary: Negative.   Musculoskeletal: Positive for back pain.  Skin: Negative.   Neurological: Negative.   Endo/Heme/Allergies: Negative.   Psychiatric/Behavioral: Positive for depression and hallucinations. Negative for suicidal ideas, memory loss and substance abuse. The patient is nervous/anxious. The patient does not have insomnia.     Blood pressure 131/58, pulse 109, temperature 98.2 F (36.8 C), temperature source Oral, resp. rate 18, height 5\' 8"  (1.727 m), weight 68.04 kg (150 lb), last menstrual period 01/11/2013.Body mass index is 22.81  kg/(m^2).  General Appearance: Disheveled  Eye Solicitor::  Fair  Speech:  Clear and Coherent  Volume:  Decreased  Mood:  Anxious and Dysphoric  Affect:  Flat  Thought Process:  Disorganized  Orientation:  Full (Time, Place, and Person)  Thought Content:  Hallucinations: Auditory Visual  Suicidal Thoughts:  No  Homicidal Thoughts:  No  Memory:  Immediate;   Fair Recent;   Fair Remote;   Fair  Judgement:  Poor  Insight:  Lacking  Psychomotor Activity:  Increased  Concentration:  Fair  Recall:  Fair  Akathisia:  No  Handed:  Right  AIMS (if indicated):     Assets:  Communication Skills Desire for Improvement Housing Leisure Time Physical Health Resilience  Sleep:  Number of Hours: 5.5   Current Medications: Current Facility-Administered Medications  Medication Dose Route Frequency Provider Last Rate Last Dose  . acetaminophen (TYLENOL) tablet 650 mg  650 mg Oral Q6H PRN Ransome Helwig      . alum & mag hydroxide-simeth (MAALOX/MYLANTA) 200-200-20 MG/5ML suspension 30 mL  30 mL Oral Q4H PRN Shuvon Rankin, NP      . ciprofloxacin (CIPRO) tablet 500 mg  500 mg Oral BID Nanine Means, NP   500 mg at 02/02/13 0808  . cyclobenzaprine (FLEXERIL) tablet 5 mg  5 mg Oral TID PRN Shuvon Rankin, NP   5 mg at 01/31/13 1041  . diazepam (VALIUM) tablet 5 mg  5 mg Oral TID Magalie Almon   5 mg at 02/02/13 0808  . HYDROcodone-acetaminophen (NORCO) 10-325 MG per tablet 1 tablet  1 tablet Oral Q6H PRN Win Guajardo   1 tablet at 02/01/13  1351  . magnesium hydroxide (MILK OF MAGNESIA) suspension 30 mL  30 mL Oral Daily PRN Shuvon Rankin, NP      . nicotine (NICODERM CQ - dosed in mg/24 hours) patch 21 mg  21 mg Transdermal Q0600 Esmae Donathan   21 mg at 02/02/13 0641  . polycarbophil (FIBERCON) tablet 1,250 mg  1,250 mg Oral Daily Shuvon Rankin, NP   1,250 mg at 02/02/13 0808  . risperiDONE (RISPERDAL M-TABS) disintegrating tablet 1 mg  1 mg Oral BID Cleotis Nipper, MD   1 mg at 02/02/13  0808  . risperiDONE microspheres (RISPERDAL CONSTA) injection 25 mg  25 mg Intramuscular Q14 Days Sherrilynn Gudgel      . traZODone (DESYREL) tablet 100 mg  100 mg Oral QHS PRN,MR X 1 Fransisca Kaufmann, NP   100 mg at 02/01/13 2251  . Warfarin - Pharmacist Dosing Inpatient   Does not apply q1800 Leina Babe        Lab Results:  Results for orders placed during the hospital encounter of 01/27/13 (from the past 48 hour(s))  PROTIME-INR     Status: Abnormal   Collection Time    02/01/13  6:33 AM      Result Value Range   Prothrombin Time 20.1 (*) 11.6 - 15.2 seconds   INR 1.77 (*) 0.00 - 1.49   Comment: Performed at [facility]  PROTIME-INR     Status: Abnormal   Collection Time    02/02/13  6:25 AM      Result Value Range   Prothrombin Time 24.8 (*) 11.6 - 15.2 seconds   INR 2.33 (*) 0.00 - 1.49   Comment: Performed at [facility]    Physical Findings: AIMS: Facial and Oral Movements Muscles of Facial Expression: None, normal Lips and Perioral Area: None, normal Jaw: None, normal Tongue: None, normal,Extremity Movements Upper (arms, wrists, hands, fingers): None, normal Lower (legs, knees, ankles, toes): None, normal, Trunk Movements Neck, shoulders, hips: None, normal, Overall Severity Severity of abnormal movements (highest score from questions above): None, normal Incapacitation due to abnormal movements: None, normal Patient's awareness of abnormal movements (rate only patient's report): No Awareness, Dental Status Current problems with teeth and/or dentures?: No Does patient usually wear dentures?: No  CIWA:    COWS:     Treatment Plan Summary: Daily contact with patient to assess and evaluate symptoms and progress in treatment Medication management  Plan: Continue crisis management and stabilization.  Medication management: She still requires a lot of encouragement for the medication. Continue Risperdal to 1 mg twice a day to  target the paranoia and psychosis.  Encouraged patient to attend groups and participate in group counseling sessions and activities. Encourage patient to be compliant with prescribed medication regimen.  Continue current treatment plan.  Conulted with PharmD Peggye Fothergill about restarting patient on coumadin as she is willing to take this medication to prevent reoccurrence of PE.   Medical Decision Making Problem Points:  Established problem, improving (1) and Review of psycho-social stressors (1) Data Points:  Review or order clinical lab tests (1) Review of medication regiment & side effects (2) Review of new medications or change in dosage (2)  I certify that inpatient services furnished can reasonably be expected to improve the patient's condition.   Thedore Mins, MD 02/02/2013, 11:11 AM

## 2013-02-02 NOTE — Progress Notes (Signed)
Pt observed pacing in the hallway. Asked this Clinical research associate for nicotine patch. Continued with bizarre hand gestures and attempts to avoid the armband scanner. Denies any new needs or concerns. 1:1 obs in place for patient's safety and pt remains safe. Lawrence [place]se[place]

## 2013-02-02 NOTE — Progress Notes (Signed)
Patient ID: [name], female   DOB: 11/05/1970, 42 y.o.   MRN: [id number] 1:1 observation note:  Patient continues to be suspicious and guarded.  At times, she makes bizarre gestures with her hands.  She presents with anxious mood.  She asked, "I was told I could go home today.  Why do I have to stay?"  Patient was informed that her discharge date is possibly on Wednesday.  She has poor insight regarding her diagnosis and the need to take her medications.  Continue 1:1 observation for safety.

## 2013-02-02 NOTE — Progress Notes (Signed)
Patient ID: [name], female   DOB: 1970-07-03, 42 y.o.   MRN: [id number] 1:1 observation note:  Patient has been pacing the hallways.  Requested pain medication for her feet. Administered hydrocodone with good results.  Patient has anxious mood and sad affect.  She is calm and cooperative.  Continue 1:1 for safety.

## 2013-02-02 NOTE — [facility] Group Notes (Signed)
[facility] LCSW Aftercare Discharge Planning Group Note   02/02/2013 9:55 AM  Participation Quality:  Engaged  Mood/Affect:  Flat  Depression Rating:  denies  Anxiety Rating:  denies  Thoughts of Suicide:  No Will you contract for safety?   NA  Current AVH:  Denies, but appears to be RIS  Plan for Discharge/Comments:  This is the first group with me that [name] has attended.  She was engaged and appeared to be grounded in reality.  She states she is taking Risperdal, and is OK with that.  Does not know how she will get home at D/C.  I will ask her again about an ACT team referral before she leaves.  Transportation Means: see above  Supports: none  [place], [place] [facility] [place] [facility]

## 2013-02-02 NOTE — Progress Notes (Signed)
ANTICOAGULATION CONSULT NOTE - Follow Up Consult  Pharmacy Consult for Coumadin Indication: H/O PE  Allergies  Allergen Reactions  . Darvocet [Propoxyphene-Acetaminophen] Nausea And Vomiting and Other (See Comments)    Shaking     Patient Measurements: Height: 5\' 8"  (172.7 cm) Weight: 150 lb (68.04 kg) IBW/kg (Calculated) : 63.9   Vital Signs: Temp: 98.2 F (36.8 C) (11/03 0704) Temp src: Oral (11/03 0704) BP: 131/58 mmHg (11/03 0706) Pulse Rate: 109 (11/03 0706)  Labs:  Recent Labs  01/31/13 0652 02/01/13 0633 02/02/13 0625  LABPROT 20.0* 20.1* 24.8*  INR 1.76* 1.77* 2.33*    Estimated Creatinine Clearance: 91.5 ml/min (by C-G formula based on Cr of 0.76).   Medications:  Prescriptions prior to admission  Medication Sig Dispense Refill  . diazepam (VALIUM) 5 MG tablet Take 5 mg by mouth 3 (three) times daily.       Marland Kitchen HYDROcodone-acetaminophen (NORCO) 10-325 MG per tablet Take 1 tablet by mouth 4 (four) times daily.      Marland Kitchen warfarin (COUMADIN) 5 MG tablet Take 5-7.5 mg by mouth See admin instructions. 5 mg Monday-Saturday.  7.5 mg on Sunday       Scheduled:  . ciprofloxacin  500 mg Oral BID  . diazepam  5 mg Oral TID  . nicotine  21 mg Transdermal Q0600  . polycarbophil  1,250 mg Oral Daily  . risperiDONE  1 mg Oral BID  . risperiDONE microspheres  25 mg Intramuscular Q14 Days  . Warfarin - Pharmacist Dosing Inpatient   Does not apply q1800    Assessment: INR at goal of INR 2-3  No problems noted  With therapy  Goal of Therapy:  INR 2-3    Plan:  Coumadin 5 mg po x1 today(11/3) and tomorrow (11/4).  PT/INR in am 02/04/13.   [name] 02/02/2013,1:22 PM

## 2013-02-02 NOTE — Progress Notes (Signed)
Recreation Therapy Notes  Date: 11.03.2014 Time: 9:30am Location: [address] Day[address]  Group Topic: Wellness  Goal Area(s) Addresses:  Patient will define components of whole wellness. Patient will verbalize benefit of whole wellness.  Behavioral Response: Disengaged,  Intervention: Air traffic controller  Activity: Hydrologist. Patients were given a worksheet that helped them identify ways the invest in body, mind, and spirit, as well as support people and goals they want to work towards. Discussion focused on the impact of creating self-care plan to wellness and why wellness is important.    Education: Corporate treasurer, Engineer, materials, Building control surveyor, Coping Skills   Education Outcome: Needs additional education.   Clinical Observations/Feedback: Patient was seated in dayroom when LRT arrived to unit. Patient was observed to rock back and forth and make no eye contact with LRT or other group members. Patient made no contributions to group discussion. As LRT was passing out worksheet patient needed verbal prompt to accept worksheet from LRT. Patient completed worksheet as instructed, but listed out of place items for each category, including drinking coke for investing in her mind and body and drinking sprite for investing in her spiritual health. Patient stated drinking coke is important to her.   [name], LRT/CTRS  [name] L 02/02/2013 1:18 PM

## 2013-02-02 NOTE — [facility] Group Notes (Signed)
[facility] LCSW Group Therapy  02/02/2013 1:15 pm  Type of Therapy: Process Group Therapy  Participation Level:  Distracted  Participation Quality:  Appropriate  Affect:  Flat  Cognitive:  Oriented  Insight:  Improving  Engagement in Group:  Limited  Engagement in Therapy:  Limited  Modes of Intervention:  Activity, Clarification, Education, Problem-solving and Support  Summary of Progress/Problems: Today's group addressed the issue of overcoming obstacles.  Patients were asked to identify their biggest obstacle post d/c that stands in the way of their on-going success, and then problem solve as to how to manage this. Dalisa spent much of group moving her mouth as if talking, and using hand gestures.  She had a difficult time paying attention and staying up with what we were talking about.  This is the second group she has attended with me.  She eventually got up and left.  Daryel Gerald B 02/02/2013   1:27 PM

## 2013-02-02 NOTE — Progress Notes (Signed)
Adult Psychoeducational Group Note  Date:  02/02/2013 Time:  9:30 PM  Group Topic/Focus:  Wrap-Up Group:   The focus of this group is to help patients review their daily goal of treatment and discuss progress on daily workbooks.  Participation Level:  Minimal  Participation Quality:  Redirectable  Affect:  Flat  Cognitive:  Lacking  Insight: Limited  Engagement in Group:  Supportive  Modes of Intervention:  Support  Additional Comments:  Patient attended and participated in group tonight. She reports that she eat well, exercise, took her medication, went to her groups and prayed today. She take care of her wellness by exercising, take her vitamin, and pray every 24 hours.   Lita Mains [facility] 02/02/2013, 9:30 PM

## 2013-02-02 NOTE — Progress Notes (Signed)
Patient ID: [name], female   DOB: 1970/11/29, 42 y.o.   MRN: [id number] 1:1 observation note: D: Patient has been up in the milieu.  She has been pleasant and cooperative.  She met with MD and stated that she plans to stay on her medications.  Patient states that she cannot take an injectable and would stay on the po meds.  Patient was advised by MD that next time she came in for not taking her medications, she would be given an injectable.  Patient was agreeable to same.  She denies any SI/HI/AVH.  She remains cautious and guarded. A: Continue to monitor medication management and MD orders. R: 1:1 continued for safety.  Patient is receptive to staff.

## 2013-02-02 NOTE — Progress Notes (Signed)
Pt observed resting in bed with eyes closed. RR WNL, even and unlabored. No distress observed or complaints voiced. Level I obs in place for safety and pt remains safe. [name]

## 2013-02-03 MED ORDER — BENZTROPINE MESYLATE 0.5 MG PO TABS
0.5000 mg | ORAL_TABLET | Freq: Every day | ORAL | Status: DC
  Administered 2013-02-03 – 2013-02-04 (×2): 0.5 mg via ORAL
  Filled 2013-02-03: qty 3
  Filled 2013-02-03 (×3): qty 1

## 2013-02-03 MED ORDER — RISPERIDONE 1 MG PO TBDP
3.0000 mg | ORAL_TABLET | Freq: Every day | ORAL | Status: DC
  Administered 2013-02-03 – 2013-02-04 (×2): 3 mg via ORAL
  Filled 2013-02-03 (×2): qty 3
  Filled 2013-02-03: qty 9
  Filled 2013-02-03: qty 3

## 2013-02-03 NOTE — Progress Notes (Signed)
Patient ID: [name], female   DOB: 04-02-1971, 42 y.o.   MRN: [id number]   D: When asked about her day, pt stated, "slept some, eaten today, went to many groups". However, almost immediately afterwards pt stopped talking and started pacing again. After several seconds of not talking pt asked the Clinical research associate for a bandaid and Web designer of her meds. Writer had pt assist mht in making pt's bed. A: Continue 1:1 for safety. R: pt remains safe.

## 2013-02-03 NOTE — Progress Notes (Signed)
Did not attend group 

## 2013-02-03 NOTE — Progress Notes (Signed)
Patient ID: [name], female   DOB: 1970-11-03, 42 y.o.   MRN: [id number] 1:1 observation note:  Patient continues to be restless.  Her behavior has been appropriate.  She denies any SI/HI/AVH.  Patient is hopeful for a discharge later this week.  She has been compliant with her medications.

## 2013-02-03 NOTE — Tx Team (Signed)
  Interdisciplinary Treatment Plan Update   Date Reviewed:  02/03/2013  Time Reviewed:  10:41 AM  Progress in Treatment:   Attending groups: Yes Participating in groups: Yes Taking medication as prescribed: Yes  Tolerating medication: Yes Family/Significant other contact made: No Patient understands diagnosis: Yes  Discussing patient identified problems/goals with staff: Yes Medical problems stabilized or resolved: Yes Denies suicidal/homicidal ideation: Yes  In tx team Patient has not harmed self or others: Yes  For review of initial/current patient goals, please see plan of care.  Estimated Length of Stay:  2-3 days  Reason for Continuation of Hospitalization: Hallucinations Medication stabilization  New Problems/Goals identified:  N/A  Discharge Plan or Barriers:   return home, follow up [facility]  Additional Comments:  [name] has been willingly taking meds since the weekend when IVC and a second opinion resulted in the threat of an injection if she did not comply with PO meds.  As such, she is much more grounded in reality and able to carry on a brief, meaningful conversation.  States she will return home at d/c and follow up at [facility].  Still refusing referral to ACT team.  Attendees:  Signature: Thedore Mins, MD 02/03/2013 10:41 AM   Signature: Richelle Ito, LCSW 02/03/2013 10:41 AM  Signature: Fransisca Kaufmann, NP 02/03/2013 10:41 AM  Signature: Joslyn Devon, RN 02/03/2013 10:41 AM  Signature: Liborio Nixon, RN 02/03/2013 10:41 AM  Signature:  02/03/2013 10:41 AM  Signature:   02/03/2013 10:41 AM  Signature:    Signature:    Signature:    Signature:    Signature:    Signature:      Scribe for Treatment Team:   Richelle Ito, LCSW  02/03/2013 10:41 AM

## 2013-02-03 NOTE — Progress Notes (Signed)
Patient ID: [name], female   DOB: April 29, 1970, 42 y.o.   MRN: [id number] 1:1 observation note:  D:Patient presents with flat affect; anxious mood.  She has been attending groups and has improved with her treatment.  Patient still makes bizarre hand gestures at times.  She denies any AH, however, appears to be responding to internal stimuli.  Patient observed pacing the hallway with her sitter. She denies any SI/HI.  Patient was disappointed that she was not being discharged today.  She stated, "I have to take care of my goat and fish."  Patient was informed that she will be discharged later in the week.  She will remain on 1:1 observation for safety due patient is a high fall risk. A: Continue to monitor medication management and MD orders. R: Patient's behavior has been appropriate.

## 2013-02-03 NOTE — Progress Notes (Signed)
   D: Pt was pleasant and cooperative this morning. Asked the writer for a bandaid and hydrocodone for knee pain. A: Continue to monitor 1:1 for safety R: Pt remains safe.

## 2013-02-03 NOTE — Progress Notes (Signed)
Patient ID: [name], female   DOB: 16-Jun-1970, 42 y.o.   MRN: [id number] 1:1 observation note:  Patient pacing the hallways with sitter. She was disappointed that she was not being discharged today.  She is compliant with her medications.  She denies any SI/HI/AVH.  Continue to observe for safety.  She is a high fall risk.

## 2013-02-03 NOTE — Progress Notes (Signed)
Patient ID: [name], female   DOB: 1970/07/15, 42 y.o.   MRN: [id number] Pt in room accompanied by 1:1 sitter.  No distress noted. Denied needs.  Pt presenting with delusional thinking during shift reporting that someone poisoned her coca cola and pulled her organs out.  She reported that person was in the [place] and she remained in her room during the shift.  Pt also reported seeing something in the sky but was not able to provide name or description of what it was she was seeing.  During medication administration, pt initially took 1mg  of the scheduled Risperidone and refused to take the remaining 2mg  and placed them in her lap.  After a brief period pt was compliant with the administration of the total 3mg  which was prescribed.  1:1 observation in progress for patient safety.  Pt safe on unit.

## 2013-02-03 NOTE — [facility] Group Notes (Signed)
[facility] LCSW Group Therapy  02/03/2013 , 10:41 AM   Type of Therapy:  Group Therapy  Participation Level: None  Participation Quality:  Distracted  Affect:  Appropriate  Cognitive:  Alert  Insight:  Limited  Engagement in Therapy:  None  Modes of Intervention:  Discussion, Exploration and Socialization  Summary of Progress/Problems: Today's group focused on the term Diagnosis.  Participants were asked to define the term, and then pronounce whether it is a negative, positive or neutral term.  [name] attended group briefly.  She did not participate while in group, and did not return.  Daryel Gerald B 02/03/2013 , 10:41 AM

## 2013-02-03 NOTE — Progress Notes (Signed)
Patient ID: [name], female   DOB: 1970/08/03, 42 y.o.   MRN: [id number] [facility] MD Progress Note  02/03/2013 10:10 AM [name]  MRN:  [id number]  Subjective: "I am still having sleeping problem, can you put me on Ambien or something?"   Objective: Patient reports that she has been doing better generally but still having trouble sleeping. She reports decreased paranoid delusions but still  maintain that she has a telepathic/psychic power. She continues to pace in their room and talks to herself and making some hand gestures which she attributes to giving "psychic energy" to people. She is compliant with her medications and  denies any adverse reactions to her medications.  Diagnosis:   DSM5: AXIS I: Chronic Paranoid Schizophrenia  PTSD by history, Marijuana Abuse  AXIS II: Deferred  AXIS III:  Past Medical History  Diagnosis Date  . Pulmonary emboli  . Cancer  . Uterus cancer  AXIS IV: other psychosocial or environmental problems and problems related to social environment  AXIS V: 31-40 impairment in reality testing  ADL's:  Impaired  Sleep: fair  Appetite:  Fair  Suicidal Ideation:  Denies Homicidal Ideation:  Denies AEB (as evidenced by):  Psychiatric Specialty Exam: Review of Systems  Constitutional: Negative.   HENT: Negative.   Eyes: Negative.   Respiratory: Negative.   Cardiovascular: Negative.   Gastrointestinal: Negative.   Genitourinary: Negative.   Musculoskeletal: Positive for back pain.  Skin: Negative.   Neurological: Negative.   Endo/Heme/Allergies: Negative.   Psychiatric/Behavioral: Positive for depression and hallucinations. Negative for suicidal ideas, memory loss and substance abuse. The patient is nervous/anxious. The patient does not have insomnia.     Blood pressure 89/64, pulse 120, temperature 97.6 F (36.4 C), temperature source Oral, resp. rate 20, height 5\' 8"  (1.727 m), weight 68.04 kg (150 lb), last menstrual period 01/11/2013.Body  mass index is 22.81 kg/(m^2).  General Appearance: Disheveled  Eye Solicitor::  Fair  Speech:  Clear and Coherent  Volume:  Decreased  Mood:  Anxious and Dysphoric  Affect:  Flat  Thought Process:  Disorganized  Orientation:  Full (Time, Place, and Person)  Thought Content:  Hallucinations: Auditory Visual  Suicidal Thoughts:  No  Homicidal Thoughts:  No  Memory:  Immediate;   Fair Recent;   Fair Remote;   Fair  Judgement:  Poor  Insight:  Lacking  Psychomotor Activity:  Increased  Concentration:  Fair  Recall:  Fair  Akathisia:  No  Handed:  Right  AIMS (if indicated):     Assets:  Communication Skills Desire for Improvement Housing Leisure Time Physical Health Resilience  Sleep:  Number of Hours: 4.25   Current Medications: Current Facility-Administered Medications  Medication Dose Route Frequency Provider Last Rate Last Dose  . acetaminophen (TYLENOL) tablet 650 mg  650 mg Oral Q6H PRN My Rinke      . alum & mag hydroxide-simeth (MAALOX/MYLANTA) 200-200-20 MG/5ML suspension 30 mL  30 mL Oral Q4H PRN Shuvon Rankin, NP      . ciprofloxacin (CIPRO) tablet 500 mg  500 mg Oral BID Nanine Means, NP   500 mg at 02/03/13 0747  . cyclobenzaprine (FLEXERIL) tablet 5 mg  5 mg Oral TID PRN Shuvon Rankin, NP   5 mg at 02/02/13 2124  . diazepam (VALIUM) tablet 5 mg  5 mg Oral TID Amariah Kierstead   5 mg at 02/03/13 0747  . HYDROcodone-acetaminophen (NORCO) 10-325 MG per tablet 1 tablet  1 tablet Oral Q6H PRN Antwion Carpenter  Amaiah Cristiano   1 tablet at 02/03/13 0657  . magnesium hydroxide (MILK OF MAGNESIA) suspension 30 mL  30 mL Oral Daily PRN Shuvon Rankin, NP      . nicotine (NICODERM CQ - dosed in mg/24 hours) patch 21 mg  21 mg Transdermal Q0600 Elyshia Kumagai   21 mg at 02/03/13 0656  . polycarbophil (FIBERCON) tablet 1,250 mg  1,250 mg Oral Daily Shuvon Rankin, NP   1,250 mg at 02/03/13 0747  . risperiDONE (RISPERDAL M-TABS) disintegrating tablet 1 mg  1 mg Oral BID Cleotis Nipper, MD    1 mg at 02/03/13 0747  . risperiDONE microspheres (RISPERDAL CONSTA) injection 25 mg  25 mg Intramuscular Q14 Days Delmas Faucett      . traZODone (DESYREL) tablet 100 mg  100 mg Oral QHS PRN,MR X 1 Fransisca Kaufmann, NP   100 mg at 02/02/13 2337  . warfarin (COUMADIN) tablet 5 mg  5 mg Oral q1800 Kailin Principato   5 mg at 02/02/13 1800  . Warfarin - Pharmacist Dosing Inpatient   Does not apply q1800 Fount Bahe        Lab Results:  Results for orders placed during the hospital encounter of 01/27/13 (from the past 48 hour(s))  PROTIME-INR     Status: Abnormal   Collection Time    02/02/13  6:25 AM      Result Value Range   Prothrombin Time 24.8 (*) 11.6 - 15.2 seconds   INR 2.33 (*) 0.00 - 1.49   Comment: Performed at [facility]    Physical Findings: AIMS: Facial and Oral Movements Muscles of Facial Expression: None, normal Lips and Perioral Area: None, normal Jaw: None, normal Tongue: None, normal,Extremity Movements Upper (arms, wrists, hands, fingers): None, normal Lower (legs, knees, ankles, toes): None, normal, Trunk Movements Neck, shoulders, hips: None, normal, Overall Severity Severity of abnormal movements (highest score from questions above): None, normal Incapacitation due to abnormal movements: None, normal Patient's awareness of abnormal movements (rate only patient's report): No Awareness, Dental Status Current problems with teeth and/or dentures?: No Does patient usually wear dentures?: No  CIWA:    COWS:     Treatment Plan Summary: Daily contact with patient to assess and evaluate symptoms and progress in treatment Medication management  Plan: Continue crisis management and stabilization.  Medication management: She still requires a lot of encouragement for the medication. Increase  Risperdal to 3 mg po Qhs to target the paranoia and psychosis.  Encouraged patient to attend groups and participate in group counseling sessions and activities.  Encourage patient to be compliant with prescribed medication regimen.  Continue current treatment plan.  Conulted with PharmD Peggye Fothergill about restarting patient on coumadin as she is willing to take this medication to prevent reoccurrence of PE.   Medical Decision Making Problem Points:  Established problem, improving (1) and Review of psycho-social stressors (1) Data Points:  Review or order clinical lab tests (1) Review of medication regiment & side effects (2) Review of new medications or change in dosage (2)  I certify that inpatient services furnished can reasonably be expected to improve the patient's condition.   Thedore Mins, MD 02/03/2013, 10:10 AM

## 2013-02-04 LAB — PROTIME-INR: Prothrombin Time: 20.4 seconds — ABNORMAL HIGH (ref 11.6–15.2)

## 2013-02-04 MED ORDER — WARFARIN SODIUM 7.5 MG PO TABS
7.5000 mg | ORAL_TABLET | Freq: Once | ORAL | Status: AC
  Administered 2013-02-04: 7.5 mg via ORAL
  Filled 2013-02-04: qty 1

## 2013-02-04 NOTE — Progress Notes (Signed)
ANTICOAGULATION CONSULT NOTE - Follow Up Consult  Pharmacy Consult for Coumadin Indication: h/o PE  Allergies  Allergen Reactions  . Darvocet [Propoxyphene-Acetaminophen] Nausea And Vomiting and Other (See Comments)    Shaking     Patient Measurements: Height: 5\' 8"  (172.7 cm) Weight: 150 lb (68.04 kg) IBW/kg (Calculated) : 63.9   Vital Signs:    Labs:  Recent Labs  02/02/13 0625 02/04/13 0640  LABPROT 24.8* 20.4*  INR 2.33* 1.80*    Estimated Creatinine Clearance: 91.5 ml/min (by C-G formula based on Cr of 0.76).   Medications:  Scheduled:  . benztropine  0.5 mg Oral QHS  . ciprofloxacin  500 mg Oral BID  . diazepam  5 mg Oral TID  . nicotine  21 mg Transdermal Q0600  . polycarbophil  1,250 mg Oral Daily  . risperiDONE  3 mg Oral QHS  . risperiDONE microspheres  25 mg Intramuscular Q14 Days  . warfarin  7.5 mg Oral ONCE-1800  . Warfarin - Pharmacist Dosing Inpatient   Does not apply q1800    Assessment: INR fell below goal.  No problems noted.  Goal of Therapy:  INR 2-3    Plan:  Coumadin 7.5 mg po x 1 today PT/INR in am   [name] 02/04/2013,8:24 AM

## 2013-02-04 NOTE — Progress Notes (Addendum)
RN 1:1 Note   D: Patient reports sleep is well; reports appetite is good; reports energy level is normal ; reports ability to pay attention is good; rates depression as 1/10; rates hopelessness 1/10; patient reports the only changes she should make is " pray more"  A: Monitored q 15 minutes; patient encouraged to attend groups; patient educated about medications; patient given medications per physician orders; patient encouraged to express feelings and/or concerns  R: Patient will forward information when asked; patient is constantly walking the hallways; patient's interaction with staff and peers is minimal but appropriate; patient during the medication time tried to walk away with all the medications instead of taking them but with redirection the patient took the medication and swallowed them;  Patient has no acute distress at this time;  patient is taking medications as prescribed and tolerating medications; patient is attending some  Groups and will forward information when asked

## 2013-02-04 NOTE — Progress Notes (Signed)
Patient ID: [name], female   DOB: 10/30/1970, 42 y.o.   MRN: [id number] [facility] MD Progress Note  02/04/2013 9:59 AM LAWAN NANEZ  MRN:  [id number]  Subjective: "I hope you let me go home tomorrow?"   Objective: Patient says she is doing well and hope to be discharged tomorrow. She remainss fixed in her delusional thinking that she has a telepathic/psychic power. She continues to pace in her  room and talks to herself and making some hand gestures which she attributes to giving "psychic energy" to people. She has been attending groups regularly and participating in the unit activities. She is compliant with her medications and  denies any adverse reactions to her medications.  Diagnosis:   DSM5: AXIS I: Chronic Paranoid Schizophrenia  PTSD by history, Marijuana Abuse  AXIS II: Deferred  AXIS III:  Past Medical History  Diagnosis Date  . Pulmonary emboli  . Cancer  . Uterus cancer  AXIS IV: other psychosocial or environmental problems and problems related to social environment  AXIS V: 50-60 moderate  ADL's:  Impaired  Sleep: fair  Appetite:  Fair  Suicidal Ideation:  Denies Homicidal Ideation:  Denies AEB (as evidenced by):  Psychiatric Specialty Exam: Review of Systems  Constitutional: Negative.   HENT: Negative.   Eyes: Negative.   Respiratory: Negative.   Cardiovascular: Negative.   Gastrointestinal: Negative.   Genitourinary: Negative.   Musculoskeletal: Positive for back pain.  Skin: Negative.   Neurological: Negative.   Endo/Heme/Allergies: Negative.   Psychiatric/Behavioral: Positive for depression and hallucinations. Negative for suicidal ideas, memory loss and substance abuse. The patient is nervous/anxious. The patient does not have insomnia.     Blood pressure 89/64, pulse 120, temperature 97.6 F (36.4 C), temperature source Oral, resp. rate 20, height 5\' 8"  (1.727 m), weight 68.04 kg (150 lb), last menstrual period 01/11/2013.Body mass index is 22.81  kg/(m^2).  General Appearance: fairly groomed  Patent attorney::  Fair  Speech:  Clear and Coherent  Volume:  Decreased  Mood:  Anxious   Affect:  neutral  Thought Process:  circumstancial  Orientation:  Full (Time, Place, and Person)  Thought Content: delusional  Suicidal Thoughts:  No  Homicidal Thoughts:  No  Memory:  Immediate;   Fair Recent;   Fair Remote;   Fair  Judgement:  marginal  Insight:  Marginal  Psychomotor Activity:  Increased  Concentration:  Fair  Recall:  Fair  Akathisia:  No  Handed:  Right  AIMS (if indicated):     Assets:  Communication Skills Desire for Improvement Housing Leisure Time Physical Health Resilience  Sleep:  Number of Hours: 3.75   Current Medications: Current Facility-Administered Medications  Medication Dose Route Frequency Provider Last Rate Last Dose  . acetaminophen (TYLENOL) tablet 650 mg  650 mg Oral Q6H PRN Turner Kunzman      . alum & mag hydroxide-simeth (MAALOX/MYLANTA) 200-200-20 MG/5ML suspension 30 mL  30 mL Oral Q4H PRN Shuvon Rankin, NP      . benztropine (COGENTIN) tablet 0.5 mg  0.5 mg Oral QHS Adream Parzych   0.5 mg at 02/03/13 2148  . ciprofloxacin (CIPRO) tablet 500 mg  500 mg Oral BID Nanine Means, NP   500 mg at 02/04/13 0827  . cyclobenzaprine (FLEXERIL) tablet 5 mg  5 mg Oral TID PRN Shuvon Rankin, NP   5 mg at 02/02/13 2124  . diazepam (VALIUM) tablet 5 mg  5 mg Oral TID Aristea Posada   5 mg at 02/04/13  0827  . HYDROcodone-acetaminophen (NORCO) 10-325 MG per tablet 1 tablet  1 tablet Oral Q6H PRN Berkeley Veldman   1 tablet at 02/03/13 2158  . magnesium hydroxide (MILK OF MAGNESIA) suspension 30 mL  30 mL Oral Daily PRN Shuvon Rankin, NP      . nicotine (NICODERM CQ - dosed in mg/24 hours) patch 21 mg  21 mg Transdermal Q0600 Nao Linz   21 mg at 02/04/13 0644  . polycarbophil (FIBERCON) tablet 1,250 mg  1,250 mg Oral Daily Shuvon Rankin, NP   1,250 mg at 02/04/13 0827  . risperiDONE (RISPERDAL M-TABS)  disintegrating tablet 3 mg  3 mg Oral QHS Demitrius Crass   3 mg at 02/03/13 2204  . risperiDONE microspheres (RISPERDAL CONSTA) injection 25 mg  25 mg Intramuscular Q14 Days Elchanan Bob      . traZODone (DESYREL) tablet 100 mg  100 mg Oral QHS PRN,MR X 1 Fransisca Kaufmann, NP   100 mg at 02/02/13 2337  . warfarin (COUMADIN) tablet 7.5 mg  7.5 mg Oral ONCE-1800 Abaigeal Moomaw      . Warfarin - Pharmacist Dosing Inpatient   Does not apply q1800 Keiton Cosma        Lab Results:  Results for orders placed during the hospital encounter of 01/27/13 (from the past 48 hour(s))  PROTIME-INR     Status: Abnormal   Collection Time    02/04/13  6:40 AM      Result Value Range   Prothrombin Time 20.4 (*) 11.6 - 15.2 seconds   INR 1.80 (*) 0.00 - 1.49   Comment: Performed at [facility]    Physical Findings: AIMS: Facial and Oral Movements Muscles of Facial Expression: None, normal Lips and Perioral Area: None, normal Jaw: None, normal Tongue: None, normal,Extremity Movements Upper (arms, wrists, hands, fingers): None, normal Lower (legs, knees, ankles, toes): None, normal, Trunk Movements Neck, shoulders, hips: None, normal, Overall Severity Severity of abnormal movements (highest score from questions above): None, normal Incapacitation due to abnormal movements: None, normal Patient's awareness of abnormal movements (rate only patient's report): No Awareness, Dental Status Current problems with teeth and/or dentures?: No Does patient usually wear dentures?: No  CIWA:    COWS:     Treatment Plan Summary: Daily contact with patient to assess and evaluate symptoms and progress in treatment Medication management  Plan: Continue crisis management and stabilization.  Medication management: She still requires a lot of encouragement for the medication. Continue  Risperdal to 3 mg po Qhs to target the paranoia and psychosis.  Encouraged patient to attend groups and  participate in group counseling sessions and activities. Encourage patient to be compliant with prescribed medication regimen.  Continue current treatment plan.  Conulted with PharmD Peggye Fothergill about restarting patient on coumadin as she is willing to take this medication to prevent reoccurrence of PE.   Medical Decision Making Problem Points:  Established problem, improving (1) and Review of psycho-social stressors (1) Data Points:  Review or order clinical lab tests (1) Review of medication regiment & side effects (2) Review of new medications or change in dosage (2)  I certify that inpatient services furnished can reasonably be expected to improve the patient's condition.   Thedore Mins, MD 02/04/2013, 9:59 AM

## 2013-02-04 NOTE — Progress Notes (Signed)
D: Patient denies SI/HI and A/V hallucinations;   A: Monitored q 15 minutes; patient encouraged to attend groups; patient educated about medications; patient given medications per physician orders; patient encouraged to express feelings and/or concerns; continue 1:1 observation  R: Patient is appropriate but still grabbing and reaching at things randomly in the air; patient only forwards information when asked and still acts very cautious and sometimes suspicious; patient's interaction with staff and peers is still very minimal;  patient is taking medications as prescribed and tolerating medications; no acute stress needs at this time

## 2013-02-04 NOTE — [facility] Group Notes (Signed)
[facility] [employer] Group Therapy  02/04/2013 , 1:25 PM    Type of Therapy:  [employer] Presentation  Participation Level:  Minimal  Participation Quality:  Distracted  Affect:  Blunted  Cognitive:  Oriented  Insight:  Limited  Engagement in Therapy:  None  Modes of Intervention:  Discussion, Education and Socialization  Summary of Progress/Problems:  [name] from [employer] came to present his recovery story and play the guitar.  Raychel sat quietly throughout.  Was RIS AEB gesturing and talking to self.  Left once, came back.  Daryel Gerald B 02/04/2013 , 1:25 PM

## 2013-02-04 NOTE — Progress Notes (Signed)
Adult Psychoeducational Group Note  Date:  02/04/2013 Time:  9:22 PM  Group Topic/Focus:  Wrap-Up Group:   The focus of this group is to help patients review their daily goal of treatment and discuss progress on daily workbooks.  Participation Level:  Minimal  Participation Quality:  Appropriate  Affect:  Flat  Cognitive:  Lacking  Insight: Limited  Engagement in Group:  Engaged  Modes of Intervention:  Support  Additional Comments:  Patient attended and participated in group tonight. She reports having a wonderful day. She walked for exercise, prayed,took her medication and attended her groups. She advised that she was leaving tomorrow. She will be getting services in the community through [facility]. Her support was friends and family.  Lita Mains [facility] 02/04/2013, 9:22 PM

## 2013-02-04 NOTE — Progress Notes (Signed)
   D: Pt laying in bed with eyes closed. Respirations are even and unlabored. No distress noted.  A:  1:1 continued for safety.  R: Pt remains safe.

## 2013-02-04 NOTE — [facility] Group Notes (Signed)
[facility] LCSW Aftercare Discharge Planning Group Note   02/04/2013 9:38 AM  Participation Quality:  Minimal  Mood/Affect:  Flat. bizarre  Depression Rating:  denies  Anxiety Rating:  denies  Thoughts of Suicide:  No Will you contract for safety?   NA  Current AVH:  Yes AEB gesturing and lips moving  Plan for Discharge/Comments:  [name] confirms that she is ready to d/c.  Asked for a cab voucher. States she will follow up at [facility].  Transportation Means: Cab  Supports: none  [place], [place] B

## 2013-02-04 NOTE — Progress Notes (Signed)
D: Patient denies A/V hallucination and denies SI/HI and reports that she is thinking about a lot of things; patient reports she likes to number things and make list  A: Monitored q 15 minutes; patient encouraged to attend groups; patient educated about medications; patient given medications per physician orders; patient encouraged to express feelings and/or concerns; continue 1:1 observation  R: Patient constantly pacing the hallways; patient keeps picking or pointing at something in the air and having different conversations ; patient's interaction with staff and peers is minimal but appropriate;  patient is taking medications as prescribed and tolerating medications; patient is attending some agroups

## 2013-02-04 NOTE — Progress Notes (Signed)
Recreation Therapy Notes  Date: 11.05.2014 Time: 9:30am Location: [address]  Group Topic: Communication  Goal Area(s) Addresses:  Patient will effectively communicate with peers in group.  Patient will verbalize benefit of healthy communication. Patient will verbalize positive effect of healthy communication on post d/c goals.   Behavioral Response: Engaged, Appropraite  Intervention: Game  Activity: Random Words. Patients were asked to select a word from the provided container. Using descriptors and/or actions patients were tasked with getting group members to guess the selected word.     Education: Communication, Discharge Planning   Education Outcome: Acknowledges understanding  Clinical Observations/Feedback: Patient actively engaged in group activity. Patient chose to describe selected words versus acting them out. Patient did so without making eye contact with group members, despite patient avoiding eye contact with peers she was able to accurately describe each selected word for peers to guess.  Patient made no additional contributions to group discussion, but appeared to actively listen as she maintained appropriate eye contact with speaker.   Marykay Lex Jejuan Scala, LRT/CTRS  Jala Dundon L 02/04/2013 1:38 PM

## 2013-02-05 DIAGNOSIS — F4310 Post-traumatic stress disorder, unspecified: Secondary | ICD-10-CM

## 2013-02-05 MED ORDER — WARFARIN SODIUM 10 MG PO TABS
10.0000 mg | ORAL_TABLET | Freq: Once | ORAL | Status: DC
  Filled 2013-02-05: qty 1

## 2013-02-05 MED ORDER — TRAZODONE HCL 100 MG PO TABS: 100.0000 mg | ORAL_TABLET | Freq: Every evening | ORAL | Status: DC | PRN

## 2013-02-05 MED ORDER — RISPERIDONE 3 MG PO TBDP: 3.0000 mg | ORAL_TABLET | Freq: Every day | ORAL | Status: DC

## 2013-02-05 MED ORDER — WARFARIN SODIUM 5 MG PO TABS: 5.0000 mg | ORAL_TABLET | ORAL | Status: DC

## 2013-02-05 MED ORDER — DIAZEPAM 5 MG PO TABS: 5.0000 mg | ORAL_TABLET | Freq: Three times a day (TID) | ORAL | Status: DC

## 2013-02-05 MED ORDER — BENZTROPINE MESYLATE 0.5 MG PO TABS: 0.5000 mg | ORAL_TABLET | Freq: Every day | ORAL | Status: DC

## 2013-02-05 MED ORDER — HYDROCODONE-ACETAMINOPHEN 10-325 MG PO TABS: 1.0000 | ORAL_TABLET | Freq: Four times a day (QID) | ORAL | Status: DC

## 2013-02-05 NOTE — Progress Notes (Signed)
   D: Pt laying in bed resting with eyes closed. Respirations even and unlabored. No distress noted.  A;  Continue 1:1 for safety.   R: pt remains safe

## 2013-02-05 NOTE — Progress Notes (Signed)
[facility] Adult Case Management Discharge Plan :  Will you be returning to the same living situation after discharge: Yes,  home At discharge, do you have transportation home?:Yes,  cab Do you have the ability to pay for your medications:Yes,  MCD  Release of information consent forms completed and in the chart;  Patient's signature needed at discharge.  Patient to Follow up at: Follow-up Information   Follow up with [facility]. (Walk-in M-F between 8 and 9:30AM for your hospital follow up appointment)       Follow up with [facility]. (Go to the walk-in clinic M-F between 8 and 9AM for your hospital follow-up appointment)    Contact information:   [address]  [place]  [336] [phone]      Patient denies SI/HI:   Yes,  yes    Safety Planning and Suicide Prevention discussed:  Yes,  yes  Daryel Gerald B 02/05/2013, 11:15 AM

## 2013-02-05 NOTE — Progress Notes (Addendum)
D: pt came to the window willingly and asked for her meds. Stated that she needed pain medicine. Writer informed pt of scheduled hs meds, but pt stated "she didn't need to take them right now". Due to pt's hx of not taking meds, writer encouraged pt and gave risperdal , followed by her pain meds and cogentin. Pt was reluctant to take the cognetin, stating "I'm psychic and taking too much medicine scares."   A: Writer explained need for cogentin and Clinical research associate was given both meds. Continue 1:1 for safety  R: Pt remains safe.

## 2013-02-05 NOTE — Progress Notes (Signed)
ANTICOAGULATION CONSULT NOTE - Follow Up Consult  Pharmacy Consult for Coumadin Indication: h/o PE  Allergies  Allergen Reactions  . Darvocet [Propoxyphene-Acetaminophen] Nausea And Vomiting and Other (See Comments)    Shaking     Patient Measurements: Height: 5\' 8"  (172.7 cm) Weight: 150 lb (68.04 kg) IBW/kg (Calculated) : 63.9   Vital Signs:    Labs:  Recent Labs  02/04/13 0640 02/05/13 0608  LABPROT 20.4* 18.0*  INR 1.80* 1.53*    Estimated Creatinine Clearance: 91.5 ml/min (by C-G formula based on Cr of 0.76).   Medications:  Prescriptions prior to admission  Medication Sig Dispense Refill  . diazepam (VALIUM) 5 MG tablet Take 5 mg by mouth 3 (three) times daily.       Marland Kitchen HYDROcodone-acetaminophen (NORCO) 10-325 MG per tablet Take 1 tablet by mouth 4 (four) times daily.      Marland Kitchen warfarin (COUMADIN) 5 MG tablet Take 5-7.5 mg by mouth See admin instructions. 5 mg Monday-Saturday.  7.5 mg on Sunday        Assessment: INR below goal.  INR decreased after two 5 mg doses.   Goal of Therapy:  INR 2-3    Plan:  Coumadin 10 mg x 1 today.  PT/INR in am   [name] 02/05/2013,7:58 AM

## 2013-02-05 NOTE — [facility] Suicide Risk Assessment (Signed)
[facility] INPATIENT:  Family/Significant Other Suicide Prevention Education  Suicide Prevention Education:  Education Completed; No one has been identified by the patient as the family member/significant other with whom the patient will be residing, and identified as the person(s) who will aid the patient in the event of a mental health crisis (suicidal ideations/suicide attempt).  With written consent from the patient, the family member/significant other has been provided the following suicide prevention education, prior to the and/or following the discharge of the patient.  The suicide prevention education provided includes the following:  Suicide risk factors  Suicide prevention and interventions  National Suicide Hotline telephone number  [facility] assessment telephone number  [facility] Emergency Assistance 911  [facility] and/or Residential Mobile Crisis Unit telephone number  Request made of family/significant other to:  Remove weapons (e.g., guns, rifles, knives), all items previously/currently identified as safety concern.    Remove drugs/medications (over-the-counter, prescriptions, illicit drugs), all items previously/currently identified as a safety concern.  The family member/significant other verbalizes understanding of the suicide prevention education information provided.  The family member/significant other agrees to remove the items of safety concern listed above. The patient did not endorse SI at the time of admission, nor did the patient c/o SI during the stay here.  SPE not required.   Daryel Gerald B 02/05/2013, 11:04 AM

## 2013-02-05 NOTE — [facility] Suicide Risk Assessment (Signed)
Suicide Risk Assessment  Discharge Assessment     Demographic Factors:  Caucasian, Low socioeconomic status, Unemployed and female  Mental Status Per Nursing Assessment::   On Admission:     Current Mental Status by Physician: patient denies suicidal ideation, intent or plan  Loss Factors: Financial problems/change in socioeconomic status  Historical Factors: Family history of mental illness or substance abuse  Risk Reduction Factors:   Sense of responsibility to family, Living with another person, especially a relative and Positive social support  Continued Clinical Symptoms:  Alcohol/Substance Abuse/Dependencies  Cognitive Features That Contribute To Risk:  Closed-mindedness Polarized thinking    Suicide Risk:  Minimal: No identifiable suicidal ideation.  Patients presenting with no risk factors but with morbid ruminations; may be classified as minimal risk based on the severity of the depressive symptoms  Discharge Diagnoses:   AXIS I:  Chronic Paranoid Schizophrenia               Post traumatic stress disorder              Cannabis use disorder AXIS II:  Deferred AXIS III:   Past Medical History  Diagnosis Date  . Pulmonary emboli   . Cancer   . Uterus cancer    AXIS IV:  other psychosocial or environmental problems and problems related to social environment AXIS V:  61-70 mild symptoms  Plan Of Care/Follow-up recommendations:  Activity:  as tolerated Diet:  healthy Tests:  routine Other:  patient to keep her follow up appointment  Is patient on multiple antipsychotic therapies at discharge:  No   Has Patient had three or more failed trials of antipsychotic monotherapy by history:  No  Recommended Plan for Multiple Antipsychotic Therapies: NA  Joy Mosey,MD 02/05/2013, 9:07 AM
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
## 2013-02-05 NOTE — Progress Notes (Signed)
[facility] Adult Case Management Discharge Plan :  Will you be returning to the same living situation after discharge: Yes,  home At discharge, do you have transportation home?:Yes,  cab Do you have the ability to pay for your medications:Yes,  MCD  Release of information consent forms completed and in the chart;  Patient's signature needed at discharge.  Patient to Follow up at: Follow-up Information   Follow up with [facility]. (Walk-in M-F between 8 and 9:30AM for your hospital follow up appointment)       Follow up with [name]. (Go to the walk-in clinic M-F between 8 and 9AM for your hospital follow-up appointment)    Contact information:   [address]  [place]  [336] [phone]      Patient denies SI/HI:   Yes,  yes    Safety Planning and Suicide Prevention discussed:  Yes,  yes  Daryel Gerald B 02/05/2013, 8:05 AM

## 2013-02-05 NOTE — Tx Team (Signed)
  Interdisciplinary Treatment Plan Update   Date Reviewed:  02/05/2013  Time Reviewed:  8:06 AM  Progress in Treatment:   Attending groups: Yes Participating in groups: Yes Taking medication as prescribed: Yes  Tolerating medication: Yes Family/Significant other contact made: Yes  Patient understands diagnosis: Yes  Discussing patient identified problems/goals with staff: Yes Medical problems stabilized or resolved: Yes Denies suicidal/homicidal ideation: Yes Patient has not harmed self or others: Yes  For review of initial/current patient goals, please see plan of care.  Estimated Length of Stay:  D/C today  Reason for Continuation of Hospitalization:   New Problems/Goals identified:  N/A  Discharge Plan or Barriers:  return home, follow up [facility]   Additional Comments:  Attendees:  Signature: Thedore Mins, MD 02/05/2013 8:06 AM   Signature: Richelle Ito, LCSW 02/05/2013 8:06 AM  Signature: Fransisca Kaufmann, NP 02/05/2013 8:06 AM  Signature: Joslyn Devon, RN 02/05/2013 8:06 AM  Signature: Liborio Nixon, RN 02/05/2013 8:06 AM  Signature:  02/05/2013 8:06 AM  Signature:   02/05/2013 8:06 AM  Signature:    Signature:    Signature:    Signature:    Signature:    Signature:      Scribe for Treatment Team:   Richelle Ito, LCSW  02/05/2013 8:06 AM

## 2013-02-06 NOTE — Discharge Summary (Signed)
Seen and agreed. Anatole Apollo, MD 

## 2013-02-09 NOTE — Progress Notes (Signed)
Patient Discharge Instructions:  After Visit Summary (AVS):   Faxed to:  02/09/13 Discharge Summary Note:   Faxed to:  02/09/13 Psychiatric Admission Assessment Note:   Faxed to:  02/09/13 Suicide Risk Assessment - Discharge Assessment:   Faxed to:  02/09/13 Faxed/Sent to the Next Level Care provider:  02/09/13 Faxed to [facility] @ [phone]   Jerelene Redden, 02/09/2013, 4:15 PM

## 2013-04-28 ENCOUNTER — Ambulatory Visit: Admit: 2013-04-28 | Payer: Self-pay | Admitting: Oral Surgery

## 2013-04-28 SURGERY — MULTIPLE EXTRACTION WITH ALVEOLOPLASTY
Anesthesia: General | Site: Mouth

## 2013-05-29 ENCOUNTER — Encounter (HOSPITAL_COMMUNITY): Payer: Self-pay

## 2013-06-02 ENCOUNTER — Encounter (HOSPITAL_COMMUNITY)
Admission: RE | Admit: 2013-06-02 | Discharge: 2013-06-02 | Disposition: A | Discharge status: Home / Self Care | Payer: Medicaid Other | Source: Ambulatory Visit | Attending: Oral Surgery | Admitting: Oral Surgery

## 2013-06-02 ENCOUNTER — Encounter (HOSPITAL_COMMUNITY): Payer: Self-pay

## 2013-06-02 DIAGNOSIS — Z01812 Encounter for preprocedural laboratory examination: Secondary | ICD-10-CM | POA: Insufficient documentation

## 2013-06-02 DIAGNOSIS — Z01818 Encounter for other preprocedural examination: Secondary | ICD-10-CM | POA: Insufficient documentation

## 2013-06-02 LAB — CBC
HCT: 42.6 % (ref 36.0–46.0)
Hemoglobin: 15.2 g/dL — ABNORMAL HIGH (ref 12.0–15.0)
MCH: 33.9 pg (ref 26.0–34.0)
MCHC: 35.7 g/dL (ref 30.0–36.0)
MCV: 94.9 fL (ref 78.0–100.0)
Platelets: 382 10*3/uL (ref 150–400)
RBC: 4.49 MIL/uL (ref 3.87–5.11)
RDW: 12.9 % (ref 11.5–15.5)
WBC: 12.3 10*3/uL — ABNORMAL HIGH (ref 4.0–10.5)

## 2013-06-02 LAB — HCG, SERUM, QUALITATIVE: Preg, Serum: NEGATIVE

## 2013-06-02 LAB — BASIC METABOLIC PANEL
BUN: 13 mg/dL (ref 6–23)
CHLORIDE: 97 meq/L (ref 96–112)
CO2: 25 meq/L (ref 19–32)
Calcium: 9.5 mg/dL (ref 8.4–10.5)
Creatinine, Ser: 0.94 mg/dL (ref 0.50–1.10)
GFR calc Af Amer: 85 mL/min — ABNORMAL LOW (ref >90)
GFR calc non Af Amer: 73 mL/min — ABNORMAL LOW (ref >90)
Glucose, Bld: 80 mg/dL (ref 70–99)
Potassium: 5.2 mEq/L (ref 3.7–5.3)
Sodium: 134 mEq/L — ABNORMAL LOW (ref 137–147)

## 2013-06-02 LAB — APTT: APTT: 42 s — AB (ref 24–37)

## 2013-06-02 LAB — PROTIME-INR
INR: 1.38 (ref 0.00–1.49)
Prothrombin Time: 16.6 seconds — ABNORMAL HIGH (ref 11.6–15.2)

## 2013-06-02 NOTE — Pre-Procedure Instructions (Addendum)
Mareli Antunes  06/02/2013   Your procedure is scheduled on:  06/08/13  Report to [facility] short stay admitting at 0830 AM.  Call this number if you have problems the morning of surgery: [phone]   Remember:   Do not eat food or drink liquids after midnight.   Take these medicines the morning of surgery with A SIP OF WATER: pain med if needed,valium if needed         STOP all herbel meds, nsaids (aleve,naproxen,advil,ibuprofen) 5 days prior to surgery including vitamins,           COUMADIN PER DR   Do not wear jewelry, make-up or nail polish.  Do not wear lotions, powders, or perfumes. You may wear deodorant.  Do not shave 48 hours prior to surgery. Men may shave face and neck.  Do not bring valuables to the hospital.  [facility] is not responsible                  for any belongings or valuables.               Contacts, dentures or bridgework may not be worn into surgery.  Leave suitcase in the car. After surgery it may be brought to your room.  For patients admitted to the hospital, discharge time is determined by your                treatment team.               Patients discharged the day of surgery will not be allowed to drive  home.  Name and phone number of your driver:   Special Instructions:  Special Instructions: [facility] - Preparing for Surgery  Before surgery, you can play an important role.  Because skin is not sterile, your skin needs to be as free of germs as possible.  You can reduce the number of germs on you skin by washing with CHG (chlorahexidine gluconate) soap before surgery.  CHG is an antiseptic cleaner which kills germs and bonds with the skin to continue killing germs even after washing.  Please DO NOT use if you have an allergy to CHG or antibacterial soaps.  If your skin becomes reddened/irritated stop using the CHG and inform your nurse when you arrive at Short Stay.  Do not shave (including legs and underarms) for at least 48 hours prior to the first  CHG shower.  You may shave your face.  Please follow these instructions carefully:   1.  Shower with CHG Soap the night before surgery and the morning of Surgery.  2.  If you choose to wash your hair, wash your hair first as usual with your normal shampoo.  3.  After you shampoo, rinse your hair and body thoroughly to remove the Shampoo.  4.  Use CHG as you would any other liquid soap.  You can apply chg directly  to the skin and wash gently with scrungie or a clean washcloth.  5.  Apply the CHG Soap to your body ONLY FROM THE NECK DOWN.  Do not use on open wounds or open sores.  Avoid contact with your eyes ears, mouth and genitals (private parts).  Wash genitals (private parts)       with your normal soap.  6.  Wash thoroughly, paying special attention to the area where your surgery will be performed.  7.  Thoroughly rinse your body with warm water from the neck down.  8.  DO NOT shower/wash with your normal soap after using and rinsing off the CHG Soap.  9.  Pat yourself dry with a clean towel.            10.  Wear clean pajamas.            11.  Place clean sheets on your bed the night of your first shower and do not sleep with pets.  Day of Surgery  Do not apply any lotions/deodorants the morning of surgery.  Please wear clean clothes to the hospital/surgery center.   Please read over the following fact sheets that you were given: Pain Booklet, Coughing and Deep Breathing and Surgical Site Infection Prevention

## 2013-06-03 NOTE — H&P (Signed)
HISTORY AND PHYSICAL  [name] is a 43 y.o. female patient with CC: painful teeth  No diagnosis found.  Past Medical History  Diagnosis Date  . Pulmonary emboli   . Schizophrenia   . Cancer   . Uterus cancer   . Anxiety   . Depression   . PTSD (post-traumatic stress disorder)     No current facility-administered medications for this encounter.   Current Outpatient Prescriptions  Medication Sig Dispense Refill  . cyclobenzaprine (FLEXERIL) 10 MG tablet Take 10 mg by mouth 3 (three) times daily as needed for muscle spasms.      . diazepam (VALIUM) 5 MG tablet Take 5 mg by mouth every 6 (six) hours.      Marland Kitchen HYDROcodone-acetaminophen (NORCO) 10-325 MG per tablet Take 1 tablet by mouth 5 (five) times daily.      . hydrocortisone cream 1 % Apply 1 application topically daily as needed for itching.      Marland Kitchen ibuprofen (ADVIL,MOTRIN) 200 MG tablet Take 400 mg by mouth every 6 (six) hours as needed (dental pain).      . vitamin C (ASCORBIC ACID) 500 MG tablet Take 500 mg by mouth daily.      Marland Kitchen warfarin (COUMADIN) 5 MG tablet Take 5-7.5 mg by mouth daily. 5mg  daily except 7.5mg  on Sundays       Allergies  Allergen Reactions  . Darvocet [Propoxyphene N-Acetaminophen] Nausea And Vomiting and Other (See Comments)    Shaking    Active Problems:   * No active hospital problems. *  Vitals: There were no vitals taken for this visit. Lab results:No results found for this or any previous visit (from the past 61 hour(s)). Radiology Results: No results found. General appearance: alert and cooperative Head: Normocephalic, without obvious abnormality, atraumatic Eyes: negative Nose: Nares normal. Septum midline. Mucosa normal. No drainage or sinus tenderness. Throat: Dental caries and generalized periodontal disease. Non-restorable teeth #'s 6, 8, 9, 10, 17, 18, 19, 22, 23, 24, 25, 26, 27, 28, 30, Bilateral mandibular lingual tori, maxillary exostosis right. Neck: no adenopathy, supple,  symmetrical, trachea midline and thyroid not enlarged, symmetric, no tenderness/mass/nodules Resp: clear to auscultation bilaterally Cardio: regular rate and rhythm, S1, S2 normal, no murmur, click, rub or gallop  Assessment: Non-restorable teeth #'s 6, 8, 9, 10, 17, 18, 19, 22, 23, 24, 25, 26, 27, 28, 30, Bilateral mandibular tori, right maxillary exostosis.  Plan: extraction teeth #'s 6, 8, 9, 10, 17, 18, 19, 22, 23, 24, 25, 26, 27, 28, 30. Removal Bilateral mandibular tori, right maxillary exostosis.General anesthesia. Day surgery.    Gae Bon 06/03/2013

## 2013-06-03 NOTE — Progress Notes (Signed)
Anesthesia Chart Review:  Patient is a 43 year old female scheduled for multiple teeth extractions, alveoloplasty, removal of bilateral mandibular lingual tori and right exostosis, maxillary on 06/08/13 by Dr. Hoyt Koch.  History includes smoking, PE, uterine cancer, HTN, anxiety, depression, Schizophrenia, PTSD. She had already been told to hold Coumadin starting 06/01/13 for planned surgery.  PCP is with [facility].  EKG on 01/25/13 showed SR, LAE, consider biatrial enlargement, RSR prime in V1 or V2, right VCD or RVH.  Echo on 08/23/11 showed:  - Left ventricle: The cavity size was normal. Systolic function was normal. The estimated ejection fraction was in the range of 55% to 60%. Wall motion was normal; there were no regional wall motion abnormalities. - Pericardium, extracardiac: A small to moderate pericardial effusion was identified circumferential to the heart. There was no evidence of hemodynamic compromise.  1V CXR on 12/17/12 showed no radiographic evidence of acute cardiopulmonary disease.  Preoperative labs noted.  Repeat PT/PTT on arrival. If follow-up labs acceptable and no other acute changes then I anticipate that she can proceed as planned.  George Hugh [facility] Short Stay Center/Anesthesiology Phone [phone] 06/03/2013 12:58 PM

## 2013-06-07 MED ORDER — CEFAZOLIN SODIUM-DEXTROSE 2-3 GM-% IV SOLR
2.0000 g | INTRAVENOUS | Status: AC
  Administered 2013-06-08: 2 g via INTRAVENOUS
  Filled 2013-06-07 (×2): qty 50

## 2013-06-08 ENCOUNTER — Ambulatory Visit (HOSPITAL_COMMUNITY): Payer: Medicaid Other | Admitting: Anesthesiology

## 2013-06-08 ENCOUNTER — Encounter (HOSPITAL_COMMUNITY): Payer: Self-pay | Admitting: *Deleted

## 2013-06-08 ENCOUNTER — Ambulatory Visit (HOSPITAL_COMMUNITY)
Admission: RE | Admit: 2013-06-08 | Discharge: 2013-06-08 | Disposition: A | Discharge status: Home / Self Care | Payer: Medicaid Other | Source: Ambulatory Visit | Attending: Oral Surgery | Admitting: Oral Surgery

## 2013-06-08 ENCOUNTER — Encounter (HOSPITAL_COMMUNITY)
Admission: RE | Disposition: A | Discharge status: Home / Self Care | Payer: Self-pay | Source: Ambulatory Visit | Attending: Oral Surgery

## 2013-06-08 ENCOUNTER — Encounter (HOSPITAL_COMMUNITY): Payer: Medicaid Other | Admitting: Vascular Surgery

## 2013-06-08 DIAGNOSIS — I2699 Other pulmonary embolism without acute cor pulmonale: Secondary | ICD-10-CM

## 2013-06-08 DIAGNOSIS — I38 Endocarditis, valve unspecified: Secondary | ICD-10-CM

## 2013-06-08 DIAGNOSIS — I8290 Acute embolism and thrombosis of unspecified vein: Secondary | ICD-10-CM

## 2013-06-08 DIAGNOSIS — Z720 Tobacco use: Secondary | ICD-10-CM

## 2013-06-08 DIAGNOSIS — F200 Paranoid schizophrenia: Secondary | ICD-10-CM

## 2013-06-08 DIAGNOSIS — F17200 Nicotine dependence, unspecified, uncomplicated: Secondary | ICD-10-CM | POA: Insufficient documentation

## 2013-06-08 DIAGNOSIS — R0902 Hypoxemia: Secondary | ICD-10-CM

## 2013-06-08 DIAGNOSIS — F4310 Post-traumatic stress disorder, unspecified: Secondary | ICD-10-CM

## 2013-06-08 DIAGNOSIS — F329 Major depressive disorder, single episode, unspecified: Secondary | ICD-10-CM | POA: Insufficient documentation

## 2013-06-08 DIAGNOSIS — F259 Schizoaffective disorder, unspecified: Secondary | ICD-10-CM

## 2013-06-08 DIAGNOSIS — K0530 Chronic periodontitis, unspecified: Secondary | ICD-10-CM

## 2013-06-08 DIAGNOSIS — Z86711 Personal history of pulmonary embolism: Secondary | ICD-10-CM | POA: Insufficient documentation

## 2013-06-08 DIAGNOSIS — M278 Other specified diseases of jaws: Secondary | ICD-10-CM | POA: Insufficient documentation

## 2013-06-08 DIAGNOSIS — F209 Schizophrenia, unspecified: Secondary | ICD-10-CM

## 2013-06-08 DIAGNOSIS — K769 Liver disease, unspecified: Secondary | ICD-10-CM

## 2013-06-08 DIAGNOSIS — E876 Hypokalemia: Secondary | ICD-10-CM

## 2013-06-08 DIAGNOSIS — K029 Dental caries, unspecified: Secondary | ICD-10-CM

## 2013-06-08 DIAGNOSIS — F3289 Other specified depressive episodes: Secondary | ICD-10-CM | POA: Insufficient documentation

## 2013-06-08 DIAGNOSIS — I739 Peripheral vascular disease, unspecified: Secondary | ICD-10-CM | POA: Insufficient documentation

## 2013-06-08 DIAGNOSIS — N912 Amenorrhea, unspecified: Secondary | ICD-10-CM

## 2013-06-08 DIAGNOSIS — I7300 Raynaud's syndrome without gangrene: Secondary | ICD-10-CM

## 2013-06-08 DIAGNOSIS — M270 Developmental disorders of jaws: Secondary | ICD-10-CM

## 2013-06-08 DIAGNOSIS — D72829 Elevated white blood cell count, unspecified: Secondary | ICD-10-CM

## 2013-06-08 DIAGNOSIS — J189 Pneumonia, unspecified organism: Secondary | ICD-10-CM

## 2013-06-08 DIAGNOSIS — F411 Generalized anxiety disorder: Secondary | ICD-10-CM | POA: Insufficient documentation

## 2013-06-08 DIAGNOSIS — N390 Urinary tract infection, site not specified: Secondary | ICD-10-CM

## 2013-06-08 DIAGNOSIS — R000 Tachycardia, unspecified: Secondary | ICD-10-CM

## 2013-06-08 DIAGNOSIS — K006 Disturbances in tooth eruption: Secondary | ICD-10-CM | POA: Insufficient documentation

## 2013-06-08 DIAGNOSIS — F1210 Cannabis abuse, uncomplicated: Secondary | ICD-10-CM

## 2013-06-08 DIAGNOSIS — F250 Schizoaffective disorder, bipolar type: Secondary | ICD-10-CM

## 2013-06-08 DIAGNOSIS — R42 Dizziness and giddiness: Secondary | ICD-10-CM

## 2013-06-08 DIAGNOSIS — R0602 Shortness of breath: Secondary | ICD-10-CM

## 2013-06-08 HISTORY — PX: MULTIPLE EXTRACTIONS WITH ALVEOLOPLASTY: SHX5342

## 2013-06-08 LAB — PROTIME-INR
INR: 1.02 (ref 0.00–1.49)
PROTHROMBIN TIME: 13.2 s (ref 11.6–15.2)

## 2013-06-08 LAB — APTT: aPTT: 33 seconds (ref 24–37)

## 2013-06-08 SURGERY — MULTIPLE EXTRACTION WITH ALVEOLOPLASTY
Anesthesia: General | Site: Mouth

## 2013-06-08 MED ORDER — PROPOFOL 10 MG/ML IV BOLUS
INTRAVENOUS | Status: AC
  Filled 2013-06-08: qty 20

## 2013-06-08 MED ORDER — OXYMETAZOLINE HCL 0.05 % NA SOLN
NASAL | Status: DC | PRN
  Administered 2013-06-08: 1 via NASAL

## 2013-06-08 MED ORDER — FENTANYL CITRATE 0.05 MG/ML IJ SOLN
INTRAMUSCULAR | Status: DC | PRN
  Administered 2013-06-08: 100 ug via INTRAVENOUS
  Administered 2013-06-08 (×3): 50 ug via INTRAVENOUS

## 2013-06-08 MED ORDER — NICOTINE 21 MG/24HR TD PT24: 21.0000 mg | MEDICATED_PATCH | Freq: Every day | TRANSDERMAL | Status: DC

## 2013-06-08 MED ORDER — HYDROMORPHONE HCL PF 1 MG/ML IJ SOLN
0.5000 mg | INTRAMUSCULAR | Status: DC | PRN
  Administered 2013-06-08: 0.5 mg via INTRAVENOUS

## 2013-06-08 MED ORDER — OXYCODONE HCL 5 MG PO TABS
5.0000 mg | ORAL_TABLET | Freq: Once | ORAL | Status: AC | PRN
  Administered 2013-06-08: 5 mg via ORAL

## 2013-06-08 MED ORDER — ONDANSETRON HCL 4 MG/2ML IJ SOLN
INTRAMUSCULAR | Status: DC | PRN
  Administered 2013-06-08: 4 mg via INTRAVENOUS

## 2013-06-08 MED ORDER — LACTATED RINGERS IV SOLN
INTRAVENOUS | Status: DC
  Administered 2013-06-08: 09:00:00 via INTRAVENOUS

## 2013-06-08 MED ORDER — MIDAZOLAM HCL 2 MG/2ML IJ SOLN
INTRAMUSCULAR | Status: AC
  Administered 2013-06-08: 2 mg via INTRAVENOUS
  Filled 2013-06-08: qty 2

## 2013-06-08 MED ORDER — OXYCODONE-ACETAMINOPHEN 10-325 MG PO TABS: 1.0000 | ORAL_TABLET | ORAL | Status: DC | PRN

## 2013-06-08 MED ORDER — DEXAMETHASONE SODIUM PHOSPHATE 10 MG/ML IJ SOLN
INTRAMUSCULAR | Status: DC | PRN
  Administered 2013-06-08: 8 mg via INTRAVENOUS

## 2013-06-08 MED ORDER — NICOTINE 21 MG/24HR TD PT24
21.0000 mg | MEDICATED_PATCH | Freq: Once | TRANSDERMAL | Status: DC
  Administered 2013-06-08: 21 mg via TRANSDERMAL
  Filled 2013-06-08 (×2): qty 1

## 2013-06-08 MED ORDER — SODIUM CHLORIDE 0.9 % IR SOLN
Status: DC | PRN
  Administered 2013-06-08: 1000 mL

## 2013-06-08 MED ORDER — 0.9 % SODIUM CHLORIDE (POUR BTL) OPTIME
TOPICAL | Status: DC | PRN
  Administered 2013-06-08: 1000 mL

## 2013-06-08 MED ORDER — MIDAZOLAM HCL 2 MG/2ML IJ SOLN
INTRAMUSCULAR | Status: AC
  Filled 2013-06-08: qty 2

## 2013-06-08 MED ORDER — HYDROMORPHONE HCL PF 1 MG/ML IJ SOLN
INTRAMUSCULAR | Status: AC
  Administered 2013-06-08: 0.5 mg via INTRAVENOUS
  Filled 2013-06-08: qty 2

## 2013-06-08 MED ORDER — MIDAZOLAM HCL 5 MG/5ML IJ SOLN
INTRAMUSCULAR | Status: DC | PRN
  Administered 2013-06-08: 2 mg via INTRAVENOUS

## 2013-06-08 MED ORDER — LIDOCAINE HCL (CARDIAC) 20 MG/ML IV SOLN
INTRAVENOUS | Status: DC | PRN
  Administered 2013-06-08: 80 mg via INTRAVENOUS

## 2013-06-08 MED ORDER — PROPOFOL 10 MG/ML IV BOLUS
INTRAVENOUS | Status: DC | PRN
  Administered 2013-06-08: 200 mg via INTRAVENOUS

## 2013-06-08 MED ORDER — PROMETHAZINE HCL 25 MG/ML IJ SOLN: 6.2500 mg | INTRAMUSCULAR | Status: DC | PRN

## 2013-06-08 MED ORDER — NEOSTIGMINE METHYLSULFATE 1 MG/ML IJ SOLN
INTRAMUSCULAR | Status: DC | PRN
  Administered 2013-06-08: 4 mg via INTRAVENOUS

## 2013-06-08 MED ORDER — DIAZEPAM 5 MG PO TABS
ORAL_TABLET | ORAL | Status: AC
  Filled 2013-06-08: qty 1

## 2013-06-08 MED ORDER — HYDROMORPHONE HCL PF 1 MG/ML IJ SOLN
INTRAMUSCULAR | Status: AC
  Filled 2013-06-08: qty 1

## 2013-06-08 MED ORDER — HYDROMORPHONE HCL PF 1 MG/ML IJ SOLN
0.2500 mg | INTRAMUSCULAR | Status: DC | PRN
  Administered 2013-06-08 (×4): 0.5 mg via INTRAVENOUS

## 2013-06-08 MED ORDER — OXYCODONE HCL 5 MG PO TABS
ORAL_TABLET | ORAL | Status: AC
  Administered 2013-06-08: 5 mg via ORAL
  Filled 2013-06-08: qty 1

## 2013-06-08 MED ORDER — FENTANYL CITRATE 0.05 MG/ML IJ SOLN
INTRAMUSCULAR | Status: AC
  Filled 2013-06-08: qty 5

## 2013-06-08 MED ORDER — DIAZEPAM 5 MG PO TABS: 5.0000 mg | ORAL_TABLET | Freq: Once | ORAL | Status: DC

## 2013-06-08 MED ORDER — OXYCODONE HCL 5 MG/5ML PO SOLN: 5.0000 mg | Freq: Once | ORAL | Status: AC | PRN

## 2013-06-08 MED ORDER — ROCURONIUM BROMIDE 100 MG/10ML IV SOLN
INTRAVENOUS | Status: DC | PRN
  Administered 2013-06-08: 40 mg via INTRAVENOUS

## 2013-06-08 MED ORDER — LACTATED RINGERS IV SOLN
INTRAVENOUS | Status: DC | PRN
  Administered 2013-06-08 (×2): via INTRAVENOUS

## 2013-06-08 MED ORDER — GLYCOPYRROLATE 0.2 MG/ML IJ SOLN
INTRAMUSCULAR | Status: DC | PRN
  Administered 2013-06-08: 0.6 mg via INTRAVENOUS

## 2013-06-08 SURGICAL SUPPLY — 27 items
BUR CROSS CUT FISSURE 1.6 (BURR) ×2 IMPLANT
BUR CROSS CUT FISSURE 1.6MM (BURR) ×1
BUR EGG ELITE 4.0 (BURR) ×1 IMPLANT
BUR EGG ELITE 4.0MM (BURR) ×1
CANISTER SUCTION 2500CC (MISCELLANEOUS) ×3 IMPLANT
COVER SURGICAL LIGHT HANDLE (MISCELLANEOUS) ×3 IMPLANT
CRADLE DONUT ADULT HEAD (MISCELLANEOUS) ×3 IMPLANT
GAUZE PACKING FOLDED 2  STR (GAUZE/BANDAGES/DRESSINGS) ×2
GAUZE PACKING FOLDED 2 STR (GAUZE/BANDAGES/DRESSINGS) ×1 IMPLANT
GLOVE BIO SURGEON STRL SZ 6.5 (GLOVE) ×4 IMPLANT
GLOVE BIO SURGEON STRL SZ7.5 (GLOVE) ×3 IMPLANT
GLOVE BIO SURGEONS STRL SZ 6.5 (GLOVE) ×2
GLOVE BIOGEL PI IND STRL 7.0 (GLOVE) ×2 IMPLANT
GLOVE BIOGEL PI INDICATOR 7.0 (GLOVE) ×4
GOWN STRL NON-REIN LRG LVL3 (GOWN DISPOSABLE) ×6 IMPLANT
GOWN STRL REIN XL XLG (GOWN DISPOSABLE) ×3 IMPLANT
KIT BASIN OR (CUSTOM PROCEDURE TRAY) ×3 IMPLANT
KIT ROOM TURNOVER OR (KITS) ×3 IMPLANT
NEEDLE 22X1 1/2 (OR ONLY) (NEEDLE) ×3 IMPLANT
NS IRRIG 1000ML POUR BTL (IV SOLUTION) ×3 IMPLANT
PAD ARMBOARD 7.5X6 YLW CONV (MISCELLANEOUS) ×6 IMPLANT
SUT CHROMIC 3 0 PS 2 (SUTURE) ×3 IMPLANT
SYR CONTROL 10ML LL (SYRINGE) ×3 IMPLANT
TOWEL OR 17X26 10 PK STRL BLUE (TOWEL DISPOSABLE) ×3 IMPLANT
TRAY ENT MC OR (CUSTOM PROCEDURE TRAY) ×3 IMPLANT
TUBING IRRIGATION (MISCELLANEOUS) ×2 IMPLANT
YANKAUER SUCT BULB TIP NO VENT (SUCTIONS) ×3 IMPLANT

## 2013-06-08 NOTE — Progress Notes (Signed)
Patient states pain is 7/10 but she is "getting a handle on it." She states that she is ready to go home and declines further analgesia. She states that she can manage at home if she can leave and "go lay down." Patient states that she is "just getting worked up" and is ready to go. Anesthesia MD notified, instructed RN to notify primary MD.

## 2013-06-08 NOTE — Op Note (Signed)
06/08/2013  2:46 PM  PATIENT:  [name]  43 y.o. female  PRE-OPERATIVE DIAGNOSIS:  NON RESTORABLE TEETH #'s 6, 8, 9, 10, 18, 19, 22, 23, 24, 25, 26, 27, 28, 30, Impacted tooth # 17, right maxillary bony exostosis, bilateral mandibular lingual tori  POST-OPERATIVE DIAGNOSIS:  SAME  PROCEDURE:  Procedure(s): EXTRACTIONS TEETH #'s 6, 8, 9, 10, 18, 19, 22, 23, 24, 25, 26, 27, 28, 30, Impacted tooth # 17, removal right maxillary bony exostosis, removal bilateral mandibular lingual tori , ALVEOLOPLASTY  SURGEON:  Surgeon(s): Gae Bon, DDS  ANESTHESIA:   local and general  EBL:  minimal  DRAINS: none   SPECIMEN:  No Specimen  COUNTS:  YES  PLAN OF CARE: Discharge to home after PACU  PATIENT DISPOSITION:  PACU - hemodynamically stable.   PROCEDURE DETAILS: Dictation # [id number]  Gae Bon, DMD 06/08/2013 2:46 PM

## 2013-06-08 NOTE — Anesthesia Postprocedure Evaluation (Signed)
  Anesthesia Post-op Note  Patient: [name]  Procedure(s) Performed: Procedure(s): EXTRACTIONS, ALVEOLOPLASTY,TORI,EXOSTOSIS (N/A)  Patient Location: PACU  Anesthesia Type:General  Level of Consciousness: awake and alert   Airway and Oxygen Therapy: Patient Spontanous Breathing  Post-op Pain: moderate  Post-op Assessment: Post-op Vital signs reviewed, Patient's Cardiovascular Status Stable and Respiratory Function Stable  Post-op Vital Signs: Reviewed  Filed Vitals:   06/08/13 1512  BP: 127/68  Pulse: 70  Temp:   Resp: 22    Complications: No apparent anesthesia complications

## 2013-06-08 NOTE — Progress Notes (Signed)
Discharge instructions reviewed with patient and significant other. Patient encouraged not to smoke, nicotine patch applied prior to discharge. Dr. Hoyt Koch notified that patient requested medication for nausea while taking percocet. He will call in prescription to [facility] on [address].

## 2013-06-08 NOTE — Transfer of Care (Signed)
Immediate Anesthesia Transfer of Care Note  Patient: [name]  Procedure(s) Performed: Procedure(s): EXTRACTIONS, ALVEOLOPLASTY,TORI,EXOSTOSIS (N/A)  Patient Location: PACU  Anesthesia Type:General  Level of Consciousness: awake, alert  and oriented  Airway & Oxygen Therapy: Patient Spontanous Breathing and Patient connected to face mask oxygen  Post-op Assessment: Report given to PACU RN and Post -op Vital signs reviewed and stable  Post vital signs: Reviewed and stable  Complications: No apparent anesthesia complications

## 2013-06-08 NOTE — Progress Notes (Signed)
Pt informed that surgery will be delayed due to prior patients.  Pt requesting nicotine patch, or valium for anxiety.  Pt took her own valium and Norco at home at [place].  Dr Orene Desanctis notified and is not willing to sedate her at this time.  Pt states she may want to reschedule her surgery.  Instructed her to think about that before making any decisions and notify staff if she continues to feel that way.

## 2013-06-08 NOTE — Anesthesia Preprocedure Evaluation (Addendum)
Anesthesia Evaluation  Patient identified by MRN, date of birth, ID band Patient awake    Reviewed: Allergy & Precautions, H&P , Patient's Chart, lab work & pertinent test results  Airway Mallampati: I  Neck ROM: Full    Dental  (+) Poor Dentition   Pulmonary shortness of breath, Current Smoker, neg PE Hx of PE  breath sounds clear to auscultation        Cardiovascular + Peripheral Vascular Disease Rhythm:Regular Rate:Normal  Raynaud's    Neuro/Psych Anxiety Depression Schizophrenia PTSD   GI/Hepatic   Endo/Other    Renal/GU      Musculoskeletal   Abdominal   Peds  Hematology   Anesthesia Other Findings   Reproductive/Obstetrics                          Anesthesia Physical Anesthesia Plan  ASA: II  Anesthesia Plan: General   Post-op Pain Management:    Induction: Intravenous  Airway Management Planned: Nasal ETT  Additional Equipment:   Intra-op Plan:   Post-operative Plan: Extubation in OR  Informed Consent: I have reviewed the patients History and Physical, chart, labs and discussed the procedure including the risks, benefits and alternatives for the proposed anesthesia with the patient or authorized representative who has indicated his/her understanding and acceptance.     Plan Discussed with: CRNA and Surgeon  Anesthesia Plan Comments:         Anesthesia Quick Evaluation

## 2013-06-08 NOTE — Preoperative (Signed)
Beta Blockers   Reason not to administer Beta Blockers:Not Applicable 

## 2013-06-08 NOTE — H&P (Signed)
H&P documentation  -History and Physical Reviewed  -Patient has been re-examined  -No change in the plan of care  [name]  

## 2013-06-09 ENCOUNTER — Encounter (HOSPITAL_COMMUNITY): Payer: Self-pay | Admitting: Oral Surgery

## 2013-06-09 NOTE — Op Note (Signed)
[name], [name]                  ACCOUNT NO.:  [id number]  MEDICAL RECORD NO.:  [id number]  LOCATION:  [facility]                         FACILITY:  [facility]  PHYSICIAN:  Gae Bon, M.D.  DATE OF BIRTH:  July 26, 1970  DATE OF PROCEDURE:  06/08/2013 DATE OF DISCHARGE:  06/08/2013                              OPERATIVE REPORT   PREOPERATIVE DIAGNOSES:  Nonrestorable teeth #6, 8, 9, 10, 18, 19, 22, 23, 24, 25, 26, 27, 28, 30.  Impacted tooth #17. Right maxillary buccal bony exostosis.  Bilateral mandibular lingual tori.  POSTOPERATIVE DIAGNOSES:  Nonrestorable teeth #6, 8, 9, 10, 18, 19, 22, 23, 24, 25, 26, 27, 28, 30.  Impacted tooth #17. Right maxillary buccal bony exostosis. Bilateral mandibular lingual tori.  PROCEDURE:  Extraction teeth #6, 8, 9, 10, 17, 18, 19, 22, 23, 24, 25, 26, 27, 28, 30.  Removal of maxillary right bone exostosis.  Removal of bilateral mandibular lingual tori.  Alveoplasty right and left maxilla and mandible.  SURGEON:  Gae Bon, MD  ANESTHESIA:  General.  PROCEDURE:  The patient was taken to the operating room, placed on the table in supine position.  General anesthesia was administered intravenously and a nasal endotracheal tube was placed and secured.  The eyes were protected, and the patient was draped for the procedure.  Time- out was performed.  The posterior pharynx was suctioned.  A throat pack was placed.  A 2% lidocaine with 1:100,000 epinephrine was infiltrated in an inferior alveolar block on the right and left side in the mandible and then in buccal and palatal infiltration in the maxilla.  Total of 17 mL was utilized.  A bite block was placed in the right side of the mouth and a 15 blade was used to make an incision overlying tooth #17 and carrying anteriorly both buccal and lingually on the mandible until tooth #26 was reached.  Then, the periosteum was reflected buccally and lingually and overlying tooth #17.  Then, a Stryker  handpiece was used with a fissure bur to remove bone overlying #17 and then the tooth was sectioned and removed in multiple pieces.  Tooth #18 and 19 were removed using the elevator and a rongeur as were 22, 23, 24, 25, 26.  The Harl Favor was used for the lower anterior forceps.  Then, the periosteum was further reflected to expose the alveolar bone and torus on the left mandible.  The sockets were curetted and then alveoplasty was performed using the egg-shaped bur and bone file and then the left mandibular lingual torus was removed using the egg-shaped bur and bone file.  Then, the area was irrigated and closed with 3-0 chromic.  A 15 blade was then used to make an incision distal to tooth #10 approximately 1 cm and carried anteriorly to tooth #6.  The periosteum was reflected with a periosteal elevator and the teeth were removed using the upper forceps. The periosteum was further reflected and alveoplasty was performed in the left maxilla using the egg-shaped bur and bone file.  Then, the area was irrigated and closed with 3-0 chromic.  Then, a sweetheart retractor and bite block were repositioned to the  other side of the mouth.  The 15 blade was used to make an incision beginning at tooth #30 carrying to tooth #27 buccally and lingually, and then incision was made in the right edentulous maxilla in the area of approximately tooth #3 carried forward to tooth #6.  The periosteum was reflected in the maxilla and mandible.  The teeth were elevated with a 301 elevator and removed from the mouth with the forceps.  Then the periosteum was further reflected to expose the alveolar crest in the maxilla and the buccal exostosis in the area of tooth #4 and 5, and in the mandible, the periosteum was reflected to expose the alveolar crest and the lingual torus.  Then, the egg-shaped bur was used to perform alveoplasty in the maxilla and mandible, and to remove the buccal exostosis and the lingual torus.   The bone file was used to further smooth these areas after the egg-shaped bur was used.  Then, the areas were irrigated and closed with 3-0 chromic.  The oral cavity was inspected and found to have good contour, hemostasis, and closure.  The oral cavity was irrigated and suctioned. Throat pack was removed.  The patient was awakened and taken to the recovery room, breathing spontaneously in good condition.  ESTIMATED BLOOD LOSS:  Minimal.  COMPLICATIONS:  None.  SPECIMENS:  None.     Gae Bon, M.D.     SMJ/MEDQ  D:  06/08/2013  T:  06/09/2013  Job:  [id number]

## 2013-10-11 ENCOUNTER — Emergency Department (HOSPITAL_COMMUNITY): Payer: Medicaid Other

## 2013-10-11 ENCOUNTER — Inpatient Hospital Stay (HOSPITAL_COMMUNITY)
Admission: EM | Admit: 2013-10-11 | Discharge: 2013-10-13 | DRG: 683 | Disposition: A | Discharge status: Home / Self Care | Payer: Medicaid Other | Attending: Internal Medicine | Admitting: Internal Medicine

## 2013-10-11 ENCOUNTER — Encounter (HOSPITAL_COMMUNITY): Payer: Self-pay | Admitting: Emergency Medicine

## 2013-10-11 DIAGNOSIS — S01501A Unspecified open wound of lip, initial encounter: Secondary | ICD-10-CM | POA: Diagnosis present

## 2013-10-11 DIAGNOSIS — F411 Generalized anxiety disorder: Secondary | ICD-10-CM | POA: Diagnosis present

## 2013-10-11 DIAGNOSIS — F4310 Post-traumatic stress disorder, unspecified: Secondary | ICD-10-CM | POA: Diagnosis present

## 2013-10-11 DIAGNOSIS — F1310 Sedative, hypnotic or anxiolytic abuse, uncomplicated: Secondary | ICD-10-CM | POA: Diagnosis present

## 2013-10-11 DIAGNOSIS — Z91199 Patient's noncompliance with other medical treatment and regimen due to unspecified reason: Secondary | ICD-10-CM | POA: Diagnosis not present

## 2013-10-11 DIAGNOSIS — F17200 Nicotine dependence, unspecified, uncomplicated: Secondary | ICD-10-CM | POA: Diagnosis present

## 2013-10-11 DIAGNOSIS — R413 Other amnesia: Secondary | ICD-10-CM | POA: Diagnosis present

## 2013-10-11 DIAGNOSIS — Z609 Problem related to social environment, unspecified: Secondary | ICD-10-CM | POA: Diagnosis not present

## 2013-10-11 DIAGNOSIS — Z86711 Personal history of pulmonary embolism: Secondary | ICD-10-CM

## 2013-10-11 DIAGNOSIS — F3289 Other specified depressive episodes: Secondary | ICD-10-CM | POA: Diagnosis present

## 2013-10-11 DIAGNOSIS — F05 Delirium due to known physiological condition: Secondary | ICD-10-CM | POA: Diagnosis present

## 2013-10-11 DIAGNOSIS — Z79899 Other long term (current) drug therapy: Secondary | ICD-10-CM

## 2013-10-11 DIAGNOSIS — D72829 Elevated white blood cell count, unspecified: Secondary | ICD-10-CM | POA: Diagnosis present

## 2013-10-11 DIAGNOSIS — R42 Dizziness and giddiness: Secondary | ICD-10-CM

## 2013-10-11 DIAGNOSIS — Z8542 Personal history of malignant neoplasm of other parts of uterus: Secondary | ICD-10-CM | POA: Diagnosis not present

## 2013-10-11 DIAGNOSIS — E872 Acidosis, unspecified: Secondary | ICD-10-CM | POA: Diagnosis not present

## 2013-10-11 DIAGNOSIS — F200 Paranoid schizophrenia: Secondary | ICD-10-CM | POA: Diagnosis present

## 2013-10-11 DIAGNOSIS — G8929 Other chronic pain: Secondary | ICD-10-CM | POA: Diagnosis present

## 2013-10-11 DIAGNOSIS — R000 Tachycardia, unspecified: Secondary | ICD-10-CM | POA: Diagnosis present

## 2013-10-11 DIAGNOSIS — Z9119 Patient's noncompliance with other medical treatment and regimen: Secondary | ICD-10-CM

## 2013-10-11 DIAGNOSIS — I2699 Other pulmonary embolism without acute cor pulmonale: Secondary | ICD-10-CM | POA: Diagnosis present

## 2013-10-11 DIAGNOSIS — F1210 Cannabis abuse, uncomplicated: Secondary | ICD-10-CM | POA: Diagnosis present

## 2013-10-11 DIAGNOSIS — E871 Hypo-osmolality and hyponatremia: Secondary | ICD-10-CM | POA: Diagnosis present

## 2013-10-11 DIAGNOSIS — E876 Hypokalemia: Secondary | ICD-10-CM

## 2013-10-11 DIAGNOSIS — F209 Schizophrenia, unspecified: Secondary | ICD-10-CM | POA: Diagnosis present

## 2013-10-11 DIAGNOSIS — Z7901 Long term (current) use of anticoagulants: Secondary | ICD-10-CM | POA: Diagnosis not present

## 2013-10-11 DIAGNOSIS — W06XXXA Fall from bed, initial encounter: Secondary | ICD-10-CM | POA: Diagnosis present

## 2013-10-11 DIAGNOSIS — F250 Schizoaffective disorder, bipolar type: Secondary | ICD-10-CM

## 2013-10-11 DIAGNOSIS — F329 Major depressive disorder, single episode, unspecified: Secondary | ICD-10-CM | POA: Diagnosis present

## 2013-10-11 DIAGNOSIS — Z720 Tobacco use: Secondary | ICD-10-CM | POA: Diagnosis present

## 2013-10-11 DIAGNOSIS — N179 Acute kidney failure, unspecified: Secondary | ICD-10-CM | POA: Diagnosis not present

## 2013-10-11 DIAGNOSIS — F2089 Other schizophrenia: Secondary | ICD-10-CM

## 2013-10-11 DIAGNOSIS — Z789 Other specified health status: Secondary | ICD-10-CM | POA: Diagnosis present

## 2013-10-11 DIAGNOSIS — F203 Undifferentiated schizophrenia: Secondary | ICD-10-CM

## 2013-10-11 DIAGNOSIS — F259 Schizoaffective disorder, unspecified: Secondary | ICD-10-CM

## 2013-10-11 DIAGNOSIS — E8729 Other acidosis: Secondary | ICD-10-CM | POA: Diagnosis present

## 2013-10-11 DIAGNOSIS — F1110 Opioid abuse, uncomplicated: Secondary | ICD-10-CM | POA: Diagnosis present

## 2013-10-11 LAB — CBC
HCT: 41.6 % (ref 36.0–46.0)
HEMOGLOBIN: 15.1 g/dL — AB (ref 12.0–15.0)
MCH: 32.1 pg (ref 26.0–34.0)
MCHC: 36.3 g/dL — AB (ref 30.0–36.0)
MCV: 88.3 fL (ref 78.0–100.0)
PLATELETS: 407 10*3/uL — AB (ref 150–400)
RBC: 4.71 MIL/uL (ref 3.87–5.11)
RDW: 13.1 % (ref 11.5–15.5)
WBC: 19 10*3/uL — ABNORMAL HIGH (ref 4.0–10.5)

## 2013-10-11 LAB — BASIC METABOLIC PANEL
Anion gap: 23 — ABNORMAL HIGH (ref 5–15)
BUN: 22 mg/dL (ref 6–23)
CALCIUM: 9.2 mg/dL (ref 8.4–10.5)
CO2: 17 meq/L — AB (ref 19–32)
Chloride: 91 mEq/L — ABNORMAL LOW (ref 96–112)
Creatinine, Ser: 1.47 mg/dL — ABNORMAL HIGH (ref 0.50–1.10)
GFR calc Af Amer: 49 mL/min — ABNORMAL LOW (ref >90)
GFR calc non Af Amer: 42 mL/min — ABNORMAL LOW (ref >90)
GLUCOSE: 125 mg/dL — AB (ref 70–99)
Potassium: 3.8 mEq/L (ref 3.7–5.3)
SODIUM: 131 meq/L — AB (ref 137–147)

## 2013-10-11 LAB — URINALYSIS, ROUTINE W REFLEX MICROSCOPIC
Glucose, UA: NEGATIVE mg/dL
HGB URINE DIPSTICK: NEGATIVE
KETONES UR: 15 mg/dL — AB
Leukocytes, UA: NEGATIVE
NITRITE: NEGATIVE
PROTEIN: 30 mg/dL — AB
Specific Gravity, Urine: 1.024 (ref 1.005–1.030)
UROBILINOGEN UA: 0.2 mg/dL (ref 0.0–1.0)
pH: 5.5 (ref 5.0–8.0)

## 2013-10-11 LAB — RAPID URINE DRUG SCREEN, HOSP PERFORMED
AMPHETAMINES: NOT DETECTED
Barbiturates: NOT DETECTED
Benzodiazepines: POSITIVE — AB
COCAINE: NOT DETECTED
OPIATES: POSITIVE — AB
Tetrahydrocannabinol: POSITIVE — AB

## 2013-10-11 LAB — I-STAT CG4 LACTIC ACID, ED: Lactic Acid, Venous: 1.29 mmol/L (ref 0.5–2.2)

## 2013-10-11 LAB — ACETAMINOPHEN LEVEL: Acetaminophen (Tylenol), Serum: <15 ug/mL (ref 10–30)

## 2013-10-11 LAB — PROTIME-INR
INR: 1.5 — ABNORMAL HIGH (ref 0.00–1.49)
Prothrombin Time: 18.1 seconds — ABNORMAL HIGH (ref 11.6–15.2)

## 2013-10-11 LAB — URINE MICROSCOPIC-ADD ON

## 2013-10-11 LAB — PREGNANCY, URINE: Preg Test, Ur: NEGATIVE

## 2013-10-11 LAB — SALICYLATE LEVEL: Salicylate Lvl: <2 mg/dL — ABNORMAL LOW (ref 2.8–20.0)

## 2013-10-11 LAB — ETHANOL

## 2013-10-11 MED ORDER — ONDANSETRON HCL 4 MG PO TABS: 4.0000 mg | ORAL_TABLET | Freq: Four times a day (QID) | ORAL | Status: DC | PRN

## 2013-10-11 MED ORDER — STERILE WATER FOR INJECTION IJ SOLN
INTRAMUSCULAR | Status: AC
  Administered 2013-10-11: 17:00:00
  Filled 2013-10-11: qty 10

## 2013-10-11 MED ORDER — WARFARIN - PHARMACIST DOSING INPATIENT: Freq: Every day | Status: DC

## 2013-10-11 MED ORDER — TRAZODONE HCL 50 MG PO TABS: 100.0000 mg | ORAL_TABLET | Freq: Every evening | ORAL | Status: DC | PRN

## 2013-10-11 MED ORDER — ZIPRASIDONE MESYLATE 20 MG IM SOLR
10.0000 mg | Freq: Once | INTRAMUSCULAR | Status: AC
  Administered 2013-10-11: 10 mg via INTRAMUSCULAR
  Filled 2013-10-11: qty 20

## 2013-10-11 MED ORDER — POLYETHYLENE GLYCOL 3350 17 G PO PACK
17.0000 g | PACK | Freq: Every day | ORAL | Status: DC | PRN
  Filled 2013-10-11: qty 1

## 2013-10-11 MED ORDER — DIAZEPAM 5 MG PO TABS
5.0000 mg | ORAL_TABLET | Freq: Three times a day (TID) | ORAL | Status: DC | PRN
  Administered 2013-10-11 – 2013-10-13 (×4): 5 mg via ORAL
  Filled 2013-10-11 (×5): qty 1

## 2013-10-11 MED ORDER — NICOTINE 21 MG/24HR TD PT24
42.0000 mg | MEDICATED_PATCH | Freq: Every day | TRANSDERMAL | Status: DC
  Administered 2013-10-11 – 2013-10-13 (×3): 42 mg via TRANSDERMAL
  Filled 2013-10-11 (×4): qty 2

## 2013-10-11 MED ORDER — RISPERIDONE 1 MG PO TABS
1.0000 mg | ORAL_TABLET | Freq: Every day | ORAL | Status: DC
  Administered 2013-10-11: 1 mg via ORAL
  Filled 2013-10-11 (×2): qty 1

## 2013-10-11 MED ORDER — ALUM & MAG HYDROXIDE-SIMETH 200-200-20 MG/5ML PO SUSP: 30.0000 mL | Freq: Four times a day (QID) | ORAL | Status: DC | PRN

## 2013-10-11 MED ORDER — ASPIRIN EC 81 MG PO TBEC
81.0000 mg | DELAYED_RELEASE_TABLET | Freq: Every day | ORAL | Status: DC
  Administered 2013-10-11 – 2013-10-13 (×3): 81 mg via ORAL
  Filled 2013-10-11 (×3): qty 1

## 2013-10-11 MED ORDER — ACETAMINOPHEN 650 MG RE SUPP: 650.0000 mg | Freq: Four times a day (QID) | RECTAL | Status: DC | PRN

## 2013-10-11 MED ORDER — TETANUS-DIPHTH-ACELL PERTUSSIS 5-2.5-18.5 LF-MCG/0.5 IM SUSP
0.5000 mL | Freq: Once | INTRAMUSCULAR | Status: AC
  Administered 2013-10-11: 0.5 mL via INTRAMUSCULAR
  Filled 2013-10-11: qty 0.5

## 2013-10-11 MED ORDER — SODIUM CHLORIDE 0.9 % IV BOLUS (SEPSIS)
1000.0000 mL | Freq: Once | INTRAVENOUS | Status: AC
  Administered 2013-10-11: 1000 mL via INTRAVENOUS

## 2013-10-11 MED ORDER — WARFARIN SODIUM 10 MG PO TABS
10.0000 mg | ORAL_TABLET | Freq: Once | ORAL | Status: AC
  Administered 2013-10-11: 10 mg via ORAL
  Filled 2013-10-11: qty 1

## 2013-10-11 MED ORDER — OXYCODONE-ACETAMINOPHEN 10-325 MG PO TABS: 1.0000 | ORAL_TABLET | ORAL | Status: DC | PRN

## 2013-10-11 MED ORDER — ENOXAPARIN SODIUM 80 MG/0.8ML ~~LOC~~ SOLN
70.0000 mg | Freq: Two times a day (BID) | SUBCUTANEOUS | Status: DC
  Administered 2013-10-11 – 2013-10-13 (×4): 70 mg via SUBCUTANEOUS
  Filled 2013-10-11 (×6): qty 0.8

## 2013-10-11 MED ORDER — ONDANSETRON HCL 4 MG/2ML IJ SOLN: 4.0000 mg | Freq: Four times a day (QID) | INTRAMUSCULAR | Status: DC | PRN

## 2013-10-11 MED ORDER — SODIUM CHLORIDE 0.9 % IV SOLN
INTRAVENOUS | Status: DC
  Administered 2013-10-11 – 2013-10-12 (×3): via INTRAVENOUS

## 2013-10-11 MED ORDER — ACETAMINOPHEN 325 MG PO TABS
650.0000 mg | ORAL_TABLET | Freq: Four times a day (QID) | ORAL | Status: DC | PRN
  Administered 2013-10-11: 650 mg via ORAL
  Filled 2013-10-11: qty 2

## 2013-10-11 MED ORDER — OXYCODONE HCL 5 MG PO TABS: 5.0000 mg | ORAL_TABLET | ORAL | Status: DC | PRN

## 2013-10-11 MED ORDER — OXYCODONE-ACETAMINOPHEN 5-325 MG PO TABS
1.0000 | ORAL_TABLET | ORAL | Status: DC | PRN
  Administered 2013-10-12 – 2013-10-13 (×3): 2 via ORAL
  Filled 2013-10-11 (×3): qty 2

## 2013-10-11 NOTE — ED Notes (Signed)
Bed: YE33 Expected date:  Expected time:  Means of arrival:  Comments: EMS/abd. Pain/?med. Clearance?

## 2013-10-11 NOTE — ED Notes (Signed)
Pt shakes her head no when asked if you she could provide a urine sample. Will try back.
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
## 2013-10-11 NOTE — ED Notes (Signed)
Took patient pocketbook out of room it is behind the desk.

## 2013-10-11 NOTE — Progress Notes (Signed)
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 Darvocet [Propoxyphene N-Acetaminophen] Nausea And Vomiting and Other (See Comments)    Shaking     Patient Measurements: Weight: 153 lb 14.1 oz (69.8 kg)  Vital Signs: Temp: 98.5 F (36.9 C) (07/12 1844) Temp src: Oral (07/12 1844) BP: 118/79 mmHg (07/12 1844) Pulse Rate: 102 (07/12 1844)  Labs:  Recent Labs  10/11/13 1405 10/11/13 1507  HGB 15.1*  --   HCT 41.6  --   PLT 407*  --   LABPROT  --  18.1*  INR  --  1.50*  CREATININE 1.47*  --     The CrCl is unknown because both a height and weight (above a minimum accepted value) are required for this calculation.   Medical History: Past Medical History  Diagnosis Date  . Pulmonary emboli   . Schizophrenia   . Cancer   . Uterus cancer   . Anxiety   . Depression   . PTSD (post-traumatic stress disorder)     Medications:  Scheduled:  . aspirin EC  81 mg Oral Daily  . nicotine  42 mg Transdermal Daily  . risperiDONE  1 mg Oral QHS   Infusions:  . sodium chloride     PRN: acetaminophen, acetaminophen, alum & mag hydroxide-simeth, diazepam, ondansetron (ZOFRAN) IV, ondansetron, oxyCODONE-acetaminophen, polyethylene glycol, traZODone  Assessment: 43 yo female on chronic warfarin for history PE. Pharmacy consulted to continue dosing upon admission and add lovenox until INR therapeutic.  Dose PTA = 5mg  daily except 7.5mg  sundays - last taken 7/11  SCr elevated, CrCl 55 ml/min  CBC wnl  Concurrent aspirin 81mg  daily noted  No bleeding reported  Goal of Therapy:  INR 2-3 Monitor platelets by anticoagulation protocol: Yes   Plan:   Increase warfarin to 10mg  tonight  Daily PT/INR  Begin lovenox 70mg  sq q12h until INR therapeutic  Monitor renal function, CBC, signs/symptoms of bleeding  Peggyann Juba,  PharmD, BCPS Pager: [phone] 10/11/2013,6:50 PM

## 2013-10-11 NOTE — ED Notes (Signed)
Pt transported to Radiology 

## 2013-10-11 NOTE — ED Notes (Signed)
Pt from home via [facility] with a c/o falling off bed and has a lip laceration. Pt has psych HX. Per EMS husband reports that she fell off the bed. On scene EMS had difficulty obtaining vitals. Pt quiet during assessment.

## 2013-10-11 NOTE — ED Notes (Signed)
MD at bedside. 

## 2013-10-11 NOTE — ED Notes (Signed)
Introduced myself to pt while getting vital signs, pt will not verbally acknowledge me. Pt was reminded of the need for urine, and encouraged to walk to the restroom to try to provide a sample however pt would still did not respond.

## 2013-10-11 NOTE — ED Provider Notes (Signed)
CSN: [id number]     Arrival date & time 10/11/13  1332 History   First MD Initiated Contact with Patient 10/11/13 1333     Chief Complaint  Patient presents with  . Fall  . Psychiatric Evaluation     (Consider location/radiation/quality/duration/timing/severity/associated sxs/prior Treatment) HPI Comments: EMS called by boyfriend Wallie Char. Fell into the floor today. Hx of schizophrenia and hyponatremia. He stated she's been pacing for the past 2 days and hasn't slept. She wouldn't allow him to help her and swung at him after she fell. She was sitting on the bed today and fell into the floor.  Mr. Fabio Neighbors kept telling me, "Her blood is off."  She is on Coumadin. Unknown if she's compliant with it. No psych meds listed.   Patient is a 43 y.o. female presenting with fall. The history is provided by the patient.  Fall This is a recurrent problem. The current episode started 1 to 2 hours ago. Episode frequency: once. The problem has been resolved. Pertinent negatives include no abdominal pain and no shortness of breath.    Past Medical History  Diagnosis Date  . Pulmonary emboli   . Schizophrenia   . Cancer   . Uterus cancer   . Anxiety   . Depression   . PTSD (post-traumatic stress disorder)    Past Surgical History  Procedure Laterality Date  . No past surgeries    . Uterine fibroid surgery      uterine cancer  . Multiple extractions with alveoloplasty N/A 06/08/2013    Procedure: Dorma Russell;  Surgeon: Gae Bon, DDS;  Location: [facility];  Service: Oral Surgery;  Laterality: N/A;   No family history on file. History  Substance Use Topics  . Smoking status: Current Every Day Smoker -- 3.00 packs/day for 15 years    Types: Cigarettes  . Smokeless tobacco: Never Used  . Alcohol Use: No   OB History   Grav Para Term Preterm Abortions TAB SAB Ect Mult Living                 Review of Systems  Constitutional: Negative for fever.    Respiratory: Negative for cough and shortness of breath.   Gastrointestinal: Negative for vomiting and abdominal pain.  All other systems reviewed and are negative.     Allergies  Darvocet  Home Medications   Prior to Admission medications   Medication Sig Start Date End Date Taking? Authorizing Provider  cyclobenzaprine (FLEXERIL) 10 MG tablet Take 10 mg by mouth 3 (three) times daily as needed for muscle spasms.    Historical Provider, MD  diazepam (VALIUM) 5 MG tablet Take 5 mg by mouth every 6 (six) hours.    Historical Provider, MD  hydrocortisone cream 1 % Apply 1 application topically daily as needed for itching.    Historical Provider, MD  ibuprofen (ADVIL,MOTRIN) 200 MG tablet Take 400 mg by mouth every 6 (six) hours as needed (dental pain).    Historical Provider, MD  nicotine (NICODERM CQ) 21 mg/24hr patch Place 1 patch (21 mg total) onto the skin daily. 06/08/13   Gae Bon, DDS  oxyCODONE-acetaminophen (PERCOCET) 10-325 MG per tablet Take 1-2 tablets by mouth every 4 (four) hours as needed for pain. 06/08/13   Gae Bon, DDS  vitamin C (ASCORBIC ACID) 500 MG tablet Take 500 mg by mouth daily.    Historical Provider, MD  warfarin (COUMADIN) 5 MG tablet Take 5-7.5 mg by mouth daily. 5mg  daily except  7.5mg  on Sundays    Historical Provider, MD   BP 120/84  Pulse 127  Temp(Src) 97.8 F (36.6 C) (Oral)  Resp 20  SpO2 98% Physical Exam  Nursing note and vitals reviewed. Constitutional: She is oriented to person, place, and time. She appears well-developed and well-nourished. No distress.  HENT:  Head: Normocephalic.    Mouth/Throat: Oropharynx is clear and moist.  Small upper lip laceration  Eyes: EOM are normal. Pupils are equal, round, and reactive to light.  Neck: Normal range of motion. Neck supple.  Cardiovascular: Normal rate and regular rhythm.  Exam reveals no friction rub.   No murmur heard. Pulmonary/Chest: Effort normal and breath sounds normal. No  respiratory distress. She has no wheezes. She has no rales.  Abdominal: Soft. She exhibits no distension. There is no tenderness. There is no rebound.  Musculoskeletal: Normal range of motion. She exhibits no edema.  Neurological: She is alert and oriented to person, place, and time.  Skin: She is not diaphoretic.    ED Course  Procedures (including critical care time) Labs Review Labs Reviewed  CBC - Abnormal; Notable for the following:    WBC 19.0 (*)    Hemoglobin 15.1 (*)    MCHC 36.3 (*)    Platelets 407 (*)    All other components within normal limits  BASIC METABOLIC PANEL  ETHANOL  SALICYLATE LEVEL  ACETAMINOPHEN LEVEL  URINE RAPID DRUG SCREEN (HOSP PERFORMED)  URINALYSIS, ROUTINE W REFLEX MICROSCOPIC  PROTIME-INR    Imaging Review No results found.   EKG Interpretation None      MDM   Final diagnoses:  Acidosis  Acute renal failure, unspecified acute renal failure type  Undifferentiated schizophrenia    25F presents with fall and bizarre behavior. Warfarin called police because she is not acting right. She's been pacing for the past 2 days and not sleeping. She is also responding to internal stimuli and also having conversations with those I cannot see. She will not answer any questions about suicidality, homicidality, delusions, hallucinations, hearing voices. She is on Coumadin and fell off the bed today. She took a swing at her boyfriend when he tried to help her after that. I asked patient to call police and she told me that her ex-boyfriend sent her here so he can sleep with other women. With her history of schizophrenia and her obvious responding to internal stimuli and paranoia, and concern she is decompensated schizophrenia. With her fall and history of Coumadin use, will scan her head and check labs. She wouldn't allow me to examine the lip laceration she sustained.  Head CT normal. No evidence of intracranial bleed. Labs show mild ARF, CO2 17.  Admitted. IVC placed.  Osvaldo Shipper, MD 10/12/13 [phone]

## 2013-10-11 NOTE — ED Notes (Signed)
Patient is pacing the floor at this time. Explained that we needed urine and she states she needed water and she could go. Will give her a few before In and Out cath

## 2013-10-12 DIAGNOSIS — F200 Paranoid schizophrenia: Secondary | ICD-10-CM

## 2013-10-12 DIAGNOSIS — F2089 Other schizophrenia: Secondary | ICD-10-CM

## 2013-10-12 DIAGNOSIS — F4310 Post-traumatic stress disorder, unspecified: Secondary | ICD-10-CM

## 2013-10-12 DIAGNOSIS — E876 Hypokalemia: Secondary | ICD-10-CM

## 2013-10-12 LAB — CBC
HCT: 39.5 % (ref 36.0–46.0)
HEMOGLOBIN: 13.7 g/dL (ref 12.0–15.0)
MCH: 31.5 pg (ref 26.0–34.0)
MCHC: 34.7 g/dL (ref 30.0–36.0)
MCV: 90.8 fL (ref 78.0–100.0)
Platelets: 371 10*3/uL (ref 150–400)
RBC: 4.35 MIL/uL (ref 3.87–5.11)
RDW: 13.4 % (ref 11.5–15.5)
WBC: 9.1 10*3/uL (ref 4.0–10.5)

## 2013-10-12 LAB — HIV ANTIBODY (ROUTINE TESTING W REFLEX): HIV 1&2 Ab, 4th Generation: NONREACTIVE

## 2013-10-12 LAB — BASIC METABOLIC PANEL
Anion gap: 16 — ABNORMAL HIGH (ref 5–15)
BUN: 17 mg/dL (ref 6–23)
CALCIUM: 8.9 mg/dL (ref 8.4–10.5)
CO2: 23 mEq/L (ref 19–32)
CREATININE: 1.08 mg/dL (ref 0.50–1.10)
Chloride: 100 mEq/L (ref 96–112)
GFR calc Af Amer: 71 mL/min — ABNORMAL LOW (ref >90)
GFR, EST NON AFRICAN AMERICAN: 61 mL/min — AB (ref >90)
GLUCOSE: 101 mg/dL — AB (ref 70–99)
Potassium: 3.2 mEq/L — ABNORMAL LOW (ref 3.7–5.3)
SODIUM: 139 meq/L (ref 137–147)

## 2013-10-12 LAB — PROTIME-INR
INR: 1.7 — AB (ref 0.00–1.49)
Prothrombin Time: 20 seconds — ABNORMAL HIGH (ref 11.6–15.2)

## 2013-10-12 LAB — VITAMIN B12: Vitamin B-12: 615 pg/mL (ref 211–911)

## 2013-10-12 LAB — RPR

## 2013-10-12 LAB — TSH: TSH: 1.12 u[IU]/mL (ref 0.350–4.500)

## 2013-10-12 MED ORDER — LORAZEPAM 2 MG/ML IJ SOLN
1.0000 mg | INTRAMUSCULAR | Status: DC | PRN
  Administered 2013-10-12 – 2013-10-13 (×2): 2 mg via INTRAVENOUS
  Filled 2013-10-12 (×2): qty 1

## 2013-10-12 MED ORDER — POTASSIUM CHLORIDE 10 MEQ/100ML IV SOLN
10.0000 meq | INTRAVENOUS | Status: AC
  Administered 2013-10-12 (×4): 10 meq via INTRAVENOUS
  Filled 2013-10-12 (×4): qty 100

## 2013-10-12 MED ORDER — RISPERIDONE 3 MG PO TABS
3.0000 mg | ORAL_TABLET | Freq: Every day | ORAL | Status: DC
  Administered 2013-10-12: 3 mg via ORAL
  Filled 2013-10-12 (×2): qty 1

## 2013-10-12 MED ORDER — WARFARIN SODIUM 5 MG PO TABS
5.0000 mg | ORAL_TABLET | Freq: Once | ORAL | Status: AC
  Administered 2013-10-12: 5 mg via ORAL
  Filled 2013-10-12: qty 1

## 2013-10-12 MED ORDER — BENZTROPINE MESYLATE 1 MG PO TABS
1.0000 mg | ORAL_TABLET | Freq: Every day | ORAL | Status: DC
  Administered 2013-10-12: 1 mg via ORAL
  Filled 2013-10-12 (×3): qty 1

## 2013-10-12 NOTE — Progress Notes (Signed)
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 Darvocet [Propoxyphene N-Acetaminophen] Nausea And Vomiting and Other (See Comments)    Shaking     Patient Measurements: Height: 5\' 6"  (167.6 cm) Weight: 153 lb 14.1 oz (69.8 kg) IBW/kg (Calculated) : 59.3  Vital Signs: Temp: 98.2 F (36.8 C) (07/13 0435) Temp src: Oral (07/13 0435) BP: 112/73 mmHg (07/13 0435) Pulse Rate: 110 (07/13 0435)  Labs:  Recent Labs  10/11/13 1405 10/11/13 1507 10/12/13 0543  HGB 15.1*  --  13.7  HCT 41.6  --  39.5  PLT 407*  --  371  LABPROT  --  18.1* 20.0*  INR  --  1.50* 1.70*  CREATININE 1.47*  --  1.08    Estimated Creatinine Clearance: 62.2 ml/min (by C-G formula based on Cr of 1.08).   Medical History: Past Medical History  Diagnosis Date  . Pulmonary emboli   . Schizophrenia   . Cancer   . Uterus cancer   . Anxiety   . Depression   . PTSD (post-traumatic stress disorder)     Medications:  Scheduled:  . aspirin EC  81 mg Oral Daily  . enoxaparin (LOVENOX) injection  70 mg Subcutaneous Q12H  . nicotine  42 mg Transdermal Daily  . potassium chloride  10 mEq Intravenous Q1 Hr x 4  . risperiDONE  1 mg Oral QHS  . warfarin  5 mg Oral ONCE-1800  . Warfarin - Pharmacist Dosing Inpatient   Does not apply q1800   Infusions:  . sodium chloride 125 mL/hr at 10/12/13 7253   PRN: acetaminophen, acetaminophen, alum & mag hydroxide-simeth, diazepam, ondansetron (ZOFRAN) IV, ondansetron, oxyCODONE, oxyCODONE-acetaminophen, polyethylene glycol, traZODone  Inpatient warfarin doses administered: 7/12 - 10mg   Assessment: 43 yo female on chronic warfarin for history PE. Pharmacy consulted to continue dosing upon admission and add lovenox until INR therapeutic. Dose PTA = 5mg  daily except 7.5mg  sundays - last taken 7/11.  INR subtherapeutic (1.5) on admission  7/12.  Goal of Therapy:  INR 2-3 Monitor platelets by anticoagulation protocol: Yes   7/13:  INR responding but not yet therapeutic  SCr improved, CrCl > 30 mL/min  H/H and pltc WNL.  No bleeding reported.  Remains on full-dose LMWH  Concomitant ASA noted    Plan:   Warfarin 5 mg PO today as per patient's usual regimen  Continue Lovenox 70 mg SQ q12h  Follow PT/INR daily while inpatient.  Monitor renal function, CBC, signs/symptoms of bleeding  Clayburn Pert, PharmD, BCPS Pager: [phone] 10/12/2013  12:53 PM

## 2013-10-12 NOTE — Progress Notes (Signed)
Clinical Social Work  CSW attempted to meet with patient in order to complete psychosocial assessment. Patient actively hallucinating and upset about medications. Patient reports she wants to leave the hospital but is currently under IVC. Patient served on 7/12 and it is valid until 10/18/13. Patient not following RN direction and unable to participate in assessment at this time. CSW called friend Lavell Luster) who reports he has been dating patient for the past 2 years. Friend at work and reports he cannot talk but that CSW can call back tomorrow morning.  CSW will continue to follow and will complete full assessment at later time.  [place], [place] [phone]

## 2013-10-12 NOTE — Progress Notes (Signed)
[facility] PROGRESS NOTE  [name] QZR:[id number] DOB: 1970-04-09 DOA: 10/11/2013 PCP: PROVIDER NOT IN SYSTEM  Brief narrative: [name] is a 43yo woman with PMH of schizophrenia, anxiety/depression, PE on anticoagulation due to possible APL sydrome, PTSD, uterine cancer who presented with progressive agitation, restlessness and memory problems over the last month.  She did have a fall with a lip laceration.  She has also had decreased PO intake.   Assessment/plan:   Schizophrenia with hallucinations, agitation - Psychiatry has been consulted - risperidone 1mg  qhs started last night - Restarted trazodone - Await psych recommendations  - Valium and ativan PRN for agitation - CT head normal - I am unclear if she has an ETOH history from review of chart, her ethyl alcohol level was low.   Sinus tachycardia, dehydration - Somewhat improved with fluids - Patient without any SOB, or apparent pain, though she is nonverbal now on my exam - If with improved fluid status and agitation still elevated, can consider scanning chest.  I do not think she would be able to tolerate a CT scan at this time.    Leukocytosis, likely due to dehydration - Returned to normal with fluids  Hyponatremia, chronic - Normalized with fluids  Metabolic acidosis, increased anion gap - Improved with fluids, still with mild gap, continue to monitor  AKI (acute kidney injury) - Improved with fluids, Cr 1.08 today - Continue IVF - UA consistent with dehydration  Pulmonary emboli - Coumadin per pharmacy - INR 1.7 today - Lovenox bridge  Depression/Anxiety - Valium and prn ativan as noted above  Memory problems - HIV negative, RPR negative, TSH WNL, Pregnancy negative - Tox screen + for THC, opiates and BZDs  Chronic pain - oxycodone and flexeril as needed    Tobacco abuse - Nicotine patch     Consultants:  Psychiatry  Procedures/Studies: Dg Chest 2 View  10/11/2013   CLINICAL DATA:   [place] off bed, history uterine cancer, pulmonary embolism, smoker  EXAM: CHEST  2 VIEW  COMPARISON:  12/17/2012  FINDINGS: Normal heart size, mediastinal contours and pulmonary vascularity.  Minimal chronic peribronchial thickening and slight chronic accentuation of basilar interstitial markings, stable.  No acute infiltrate, pleural effusion or pneumothorax.  Bones unremarkable.  IMPRESSION: Minimal chronic bronchitic changes and basilar interstitial disease.  No acute abnormalities.   Electronically Signed   By: Lavonia Dacey M.D.   On: 10/11/2013 15:08   Ct Head Wo Contrast  10/11/2013   CLINICAL DATA:  [place] off bed, lip laceration, history of schizophrenia, PTSD, uterine cancer  EXAM: CT HEAD WITHOUT CONTRAST  TECHNIQUE: Contiguous axial images were obtained from the base of the skull through the vertex without intravenous contrast.  COMPARISON:  12/22/2012  FINDINGS: Normal ventricular morphology.  No midline shift or mass effect.  Normal appearance of brain parenchyma.  No intracranial hemorrhage, mass lesion, or acute infarction.  Visualized paranasal sinuses and mastoid air cells clear.  Bones unremarkable.  IMPRESSION: Normal exam.   Electronically Signed   By: Lavonia Lumen M.D.   On: 10/11/2013 15:07       Antibiotics:  None currently  Code Status: Full Family Communication: No family at bedside, patient under IVC papers given mental status Disposition Plan: Likely will need inpatient psych  HPI/Subjective: [name] would look at me when speaking, but not answer questions.  She did follow some commands including to take a deep breath.  She was agitated and restless, unable to stay still.  She was  picking things off of herself and the bedsheets.  PRN valium and ativan initiated.  Objective: Filed Vitals:   10/11/13 1549 10/11/13 1844 10/11/13 2216 10/12/13 0435  BP: 126/74 118/79 108/69 112/73  Pulse: 113 102 95 110  Temp:  98.5 F (36.9 C) 97.7 F (36.5 C) 98.2 F (36.8 C)   TempSrc:  Oral Oral Oral  Resp: 20 18 18 18   Height:  5\' 6"  (1.676 m)    Weight:  153 lb 14.1 oz (69.8 kg)    SpO2: 94% 99% 93% 100%   No intake or output data in the 24 hours ending 10/12/13 1306  Exam:   General:  Pt is alert, does not follow commands or answer questions, per nursing she is actively hallucinating.   Cardiovascular: tachycardic, normal rhythm, S1/S2, no murmurs  Respiratory: Clear to auscultation bilaterally, no wheezing, no crackles  Abdomen: Soft, non tender, non distended, bowel sounds present, no guarding  Extremities: No edema  Neuro: Grossly nonfocal  Data Reviewed: Basic Metabolic Panel:  Recent Labs Lab 10/11/13 1405 10/12/13 0543  NA 131* 139  K 3.8 3.2*  CL 91* 100  CO2 17* 23  GLUCOSE 125* 101*  BUN 22 17  CREATININE 1.47* 1.08  CALCIUM 9.2 8.9   CBC:  Recent Labs Lab 10/11/13 1405 10/12/13 0543  WBC 19.0* 9.1  HGB 15.1* 13.7  HCT 41.6 39.5  MCV 88.3 90.8  PLT 407* 371      Scheduled Meds: . aspirin EC  81 mg Oral Daily  . enoxaparin (LOVENOX) injection  70 mg Subcutaneous Q12H  . nicotine  42 mg Transdermal Daily  . potassium chloride  10 mEq Intravenous Q1 Hr x 4  . risperiDONE  1 mg Oral QHS  . warfarin  5 mg Oral ONCE-1800  . Warfarin - Pharmacist Dosing Inpatient   Does not apply q1800   Continuous Infusions: . sodium chloride 125 mL/hr at 10/12/13 7416     Gilles Chiquito, MD  [facility] Pager [phone]  If 7PM-7AM, please contact night-coverage [email or web address] Password [email or web address] 10/12/2013, 1:06 PM   LOS: 1 day

## 2013-10-12 NOTE — Consult Note (Signed)
[facility] Face-to-Face Psychiatry Consult   Reason for Consult:  Agitation, not drinking not eating, history of schizophrenia Referring Physician:  Dr Audrie Lia is an 43 y.o. female. Total Time spent with patient: 20 minutes  Assessment: AXIS I:  Schizophrenia chronic paranoid type AXIS II:  Deferred AXIS III:   Past Medical History  Diagnosis Date  . Pulmonary emboli   . Schizophrenia   . Cancer   . Uterus cancer   . Anxiety   . Depression   . PTSD (post-traumatic stress disorder)    AXIS IV:  other psychosocial or environmental problems and problems related to social environment AXIS V:  21-30 behavior considerably influenced by delusions or hallucinations OR serious impairment in judgment, communication OR inability to function in almost all areas  Plan:  Recommend psychiatric Inpatient admission when medically cleared.  Subjective:   [name] is a 43 y.o. female patient admitted with the fall, not eating and not drinking.Marland Kitchen  HPI:  Patient seen and chart reviewed.  Patient was admitted for the above reason.  Consult was called because patient has history of schizophrenia and she was found in the room talking to herself, agitated.  Patient is a poor historian and she did not offer a lot of information.  Most of the information was obtained from electronic medical record.  Patient has history of paranoid schizophrenia and she was discharged from behavioral [facility] in November 2014 with a recommendation to continue Risperdal 3 mg at bedtime, Cogentin and trazodone.  She supposed to see [facility] for followup.  Patient told that she does not have any psychiatric illness and most of her medications is managed by her primary care physician.  The patient believed that she does not have any psychiatric illness and she only needed to take Valium and Vicodin.  Patient appears very bizarre, she was noticed talking to herself.  She gets easily irritable.  Recently she felll and she has lip  laceration.  She mentioned that she has memory problems.  As per chart she recently started over the counter weight loss pills.  Patient refused to talk about her past history.  She was noticed hallucinating but denies any active or passive suicidal thoughts or homicidal thoughts.  In the emergency room she was given Geodon IM as she was very uncooperative and agitated.  At this time patient is incoherent, delusional and responding to internal stimuli.  She is upset on medication because she is not given a Vicodin and Valium.  She is under IVC.  Patient is superficially cooperative and refused to offer any information.  Her UDS was positive for marijuana, opiates and benzodiazepine.  Past Psychiatric History: Past Medical History  Diagnosis Date  . Pulmonary emboli   . Schizophrenia   . Cancer   . Uterus cancer   . Anxiety   . Depression   . PTSD (post-traumatic stress disorder)     reports that she has been smoking Cigarettes.  She has a 60 pack-year smoking history. She has never used smokeless tobacco. She reports that she drinks alcohol. She reports that she uses illicit drugs (Marijuana). Family History  Problem Relation Age of Onset  . Schizophrenia Neg Hx      Living Arrangements: Spouse/significant other   Abuse/Neglect [facility]) Physical Abuse: Denies Verbal Abuse: Denies Sexual Abuse: Denies Allergies:   Allergies  Allergen Reactions  . Darvocet [Propoxyphene N-Acetaminophen] Nausea And Vomiting and Other (See Comments)    Shaking     ACT Assessment  Complete:  No:   Past Psychiatric History: Patient has history of schizophrenia paranoid type.  She was last admitted to behavioral [facility] in November 2014.  Objective: Blood pressure 112/73, pulse 110, temperature 98.2 F (36.8 C), temperature source Oral, resp. rate 18, height $RemoveBe'5\' 6"'qoNmtzTyd$  (1.676 m), weight 153 lb 14.1 oz (69.8 kg), SpO2 100.00%.Body mass index is 24.85 kg/(m^2). Results for orders placed during the hospital  encounter of 10/11/13 (from the past 72 hour(s))  CBC     Status: Abnormal   Collection Time    10/11/13  2:05 PM      Result Value Ref Range   WBC 19.0 (*) 4.0 - 10.5 K/uL   RBC 4.71  3.87 - 5.11 MIL/uL   Hemoglobin 15.1 (*) 12.0 - 15.0 g/dL   HCT 41.6  36.0 - 46.0 %   MCV 88.3  78.0 - 100.0 fL   MCH 32.1  26.0 - 34.0 pg   MCHC 36.3 (*) 30.0 - 36.0 g/dL   RDW 13.1  11.5 - 15.5 %   Platelets 407 (*) 150 - 400 K/uL  BASIC METABOLIC PANEL     Status: Abnormal   Collection Time    10/11/13  2:05 PM      Result Value Ref Range   Sodium 131 (*) 137 - 147 mEq/L   Potassium 3.8  3.7 - 5.3 mEq/L   Chloride 91 (*) 96 - 112 mEq/L   CO2 17 (*) 19 - 32 mEq/L   Glucose, Bld 125 (*) 70 - 99 mg/dL   BUN 22  6 - 23 mg/dL   Creatinine, Ser 1.47 (*) 0.50 - 1.10 mg/dL   Calcium 9.2  8.4 - 10.5 mg/dL   GFR calc non Af Amer 42 (*) >90 mL/min   GFR calc Af Amer 49 (*) >90 mL/min   Comment: (NOTE)     The eGFR has been calculated using the CKD EPI equation.     This calculation has not been validated in all clinical situations.     eGFR's persistently <90 mL/min signify possible Chronic Kidney     Disease.   Anion gap 23 (*) 5 - 15  ETHANOL     Status: None   Collection Time    10/11/13  2:05 PM      Result Value Ref Range   Alcohol, Ethyl (B) <11  0 - 11 mg/dL   Comment:            LOWEST DETECTABLE LIMIT FOR     SERUM ALCOHOL IS 11 mg/dL     FOR MEDICAL PURPOSES ONLY  SALICYLATE LEVEL     Status: Abnormal   Collection Time    10/11/13  2:05 PM      Result Value Ref Range   Salicylate Lvl <0.2 (*) 2.8 - 20.0 mg/dL  ACETAMINOPHEN LEVEL     Status: None   Collection Time    10/11/13  2:05 PM      Result Value Ref Range   Acetaminophen (Tylenol), Serum <15.0  10 - 30 ug/mL   Comment:            THERAPEUTIC CONCENTRATIONS VARY     SIGNIFICANTLY. A RANGE OF 10-30     ug/mL MAY BE AN EFFECTIVE     CONCENTRATION FOR MANY PATIENTS.     HOWEVER, SOME ARE BEST TREATED     AT CONCENTRATIONS  OUTSIDE THIS     RANGE.     ACETAMINOPHEN CONCENTRATIONS     >  150 ug/mL AT 4 HOURS AFTER     INGESTION AND >50 ug/mL AT 12     HOURS AFTER INGESTION ARE     OFTEN ASSOCIATED WITH TOXIC     REACTIONS.  PROTIME-INR     Status: Abnormal   Collection Time    10/11/13  3:07 PM      Result Value Ref Range   Prothrombin Time 18.1 (*) 11.6 - 15.2 seconds   INR 1.50 (*) 0.00 - 1.49  URINE RAPID DRUG SCREEN (HOSP PERFORMED)     Status: Abnormal   Collection Time    10/11/13  4:35 PM      Result Value Ref Range   Opiates POSITIVE (*) NONE DETECTED   Cocaine NONE DETECTED  NONE DETECTED   Benzodiazepines POSITIVE (*) NONE DETECTED   Amphetamines NONE DETECTED  NONE DETECTED   Tetrahydrocannabinol POSITIVE (*) NONE DETECTED   Barbiturates NONE DETECTED  NONE DETECTED   Comment:            DRUG SCREEN FOR MEDICAL PURPOSES     ONLY.  IF CONFIRMATION IS NEEDED     FOR ANY PURPOSE, NOTIFY LAB     WITHIN 5 DAYS.                LOWEST DETECTABLE LIMITS     FOR URINE DRUG SCREEN     Drug Class       Cutoff (ng/mL)     Amphetamine      1000     Barbiturate      200     Benzodiazepine   627     Tricyclics       035     Opiates          300     Cocaine          300     THC              50  URINALYSIS, ROUTINE W REFLEX MICROSCOPIC     Status: Abnormal   Collection Time    10/11/13  4:35 PM      Result Value Ref Range   Color, Urine AMBER (*) YELLOW   Comment: BIOCHEMICALS MAY BE AFFECTED BY COLOR   APPearance CLOUDY (*) CLEAR   Specific Gravity, Urine 1.024  1.005 - 1.030   pH 5.5  5.0 - 8.0   Glucose, UA NEGATIVE  NEGATIVE mg/dL   Hgb urine dipstick NEGATIVE  NEGATIVE   Bilirubin Urine SMALL (*) NEGATIVE   Ketones, ur 15 (*) NEGATIVE mg/dL   Protein, ur 30 (*) NEGATIVE mg/dL   Urobilinogen, UA 0.2  0.0 - 1.0 mg/dL   Nitrite NEGATIVE  NEGATIVE   Leukocytes, UA NEGATIVE  NEGATIVE  URINE MICROSCOPIC-ADD ON     Status: Abnormal   Collection Time    10/11/13  4:35 PM      Result Value  Ref Range   Squamous Epithelial / LPF RARE  RARE   Casts HYALINE CASTS (*) NEGATIVE   Urine-Other AMORPHOUS URATES/PHOSPHATES    PREGNANCY, URINE     Status: None   Collection Time    10/11/13  4:51 PM      Result Value Ref Range   Preg Test, Ur NEGATIVE  NEGATIVE   Comment:            THE SENSITIVITY OF THIS     METHODOLOGY IS >20 mIU/mL.  I-STAT CG4 LACTIC ACID, ED     Status: None  Collection Time    10/11/13  5:04 PM      Result Value Ref Range   Lactic Acid, Venous 1.29  0.5 - 2.2 mmol/L  CBC     Status: None   Collection Time    10/12/13  5:43 AM      Result Value Ref Range   WBC 9.1  4.0 - 10.5 K/uL   RBC 4.35  3.87 - 5.11 MIL/uL   Hemoglobin 13.7  12.0 - 15.0 g/dL   HCT 39.5  36.0 - 46.0 %   MCV 90.8  78.0 - 100.0 fL   MCH 31.5  26.0 - 34.0 pg   MCHC 34.7  30.0 - 36.0 g/dL   RDW 13.4  11.5 - 15.5 %   Platelets 371  150 - 400 K/uL  BASIC METABOLIC PANEL     Status: Abnormal   Collection Time    10/12/13  5:43 AM      Result Value Ref Range   Sodium 139  137 - 147 mEq/L   Comment: DELTA CHECK NOTED     REPEATED TO VERIFY   Potassium 3.2 (*) 3.7 - 5.3 mEq/L   Chloride 100  96 - 112 mEq/L   Comment: DELTA CHECK NOTED     REPEATED TO VERIFY   CO2 23  19 - 32 mEq/L   Glucose, Bld 101 (*) 70 - 99 mg/dL   BUN 17  6 - 23 mg/dL   Creatinine, Ser 1.08  0.50 - 1.10 mg/dL   Calcium 8.9  8.4 - 10.5 mg/dL   GFR calc non Af Amer 61 (*) >90 mL/min   GFR calc Af Amer 71 (*) >90 mL/min   Comment: (NOTE)     The eGFR has been calculated using the CKD EPI equation.     This calculation has not been validated in all clinical situations.     eGFR's persistently <90 mL/min signify possible Chronic Kidney     Disease.   Anion gap 16 (*) 5 - 15  TSH     Status: None   Collection Time    10/12/13  5:43 AM      Result Value Ref Range   TSH 1.120  0.350 - 4.500 uIU/mL   Comment: Performed at [facility]  HIV ANTIBODY (ROUTINE TESTING)     Status: None   Collection  Time    10/12/13  5:43 AM      Result Value Ref Range   HIV 1&2 Ab, 4th Generation NONREACTIVE  NONREACTIVE   Comment: (NOTE)     A NONREACTIVE HIV Ag/Ab result does not exclude HIV infection since     the time frame for seroconversion is variable. If acute HIV infection     is suspected, a HIV-1 RNA Qualitative TMA test is recommended.     HIV-1/2 Antibody Diff         Not indicated.     HIV-1 RNA, Qual TMA           Not indicated.     PLEASE NOTE: This information has been disclosed to you from records     whose confidentiality may be protected by state law. If your state     requires such protection, then the state law prohibits you from making     any further disclosure of the information without the specific written     consent of the person to whom it pertains, or as otherwise permitted     by law.  A general authorization for the release of medical or other     information is NOT sufficient for this purpose.     The performance of this assay has not been clinically validated in     patients less than 41 years old.     Performed at [facility]     Status: None   Collection Time    10/12/13  5:43 AM      Result Value Ref Range   Vitamin B-12 615  211 - 911 pg/mL   Comment: Performed at [facility]  RPR     Status: None   Collection Time    10/12/13  5:43 AM      Result Value Ref Range   RPR NON REAC  NON REAC   Comment: Performed at [facility]     Status: Abnormal   Collection Time    10/12/13  5:43 AM      Result Value Ref Range   Prothrombin Time 20.0 (*) 11.6 - 15.2 seconds   INR 1.70 (*) 0.00 - 1.49   Labs are reviewed.  Current Facility-Administered Medications  Medication Dose Route Frequency Provider Last Rate Last Dose  . 0.9 %  sodium chloride infusion   Intravenous Continuous Janece Canterbury, MD 125 mL/hr at 10/12/13 [phone]    . acetaminophen (TYLENOL) tablet 650 mg  650 mg Oral Q6H PRN Janece Canterbury, MD    650 mg at 10/11/13 2104   Or  . acetaminophen (TYLENOL) suppository 650 mg  650 mg Rectal Q6H PRN Janece Canterbury, MD      . alum & mag hydroxide-simeth (MAALOX/MYLANTA) 200-200-20 MG/5ML suspension 30 mL  30 mL Oral Q6H PRN Janece Canterbury, MD      . aspirin EC tablet 81 mg  81 mg Oral Daily Janece Canterbury, MD   81 mg at 10/12/13 0921  . diazepam (VALIUM) tablet 5 mg  5 mg Oral TID PRN Janece Canterbury, MD   5 mg at 10/12/13 0848  . enoxaparin (LOVENOX) injection 70 mg  70 mg Subcutaneous Q12H Emiliano Dyer, RPH   70 mg at 10/12/13 0848  . LORazepam (ATIVAN) injection 1-2 mg  1-2 mg Intravenous Q4H PRN Sid Falcon, MD   2 mg at 10/12/13 1458  . nicotine (NICODERM CQ - dosed in mg/24 hours) patch 42 mg  42 mg Transdermal Daily Janece Canterbury, MD   42 mg at 10/12/13 0921  . ondansetron (ZOFRAN) tablet 4 mg  4 mg Oral Q6H PRN Janece Canterbury, MD       Or  . ondansetron (ZOFRAN) injection 4 mg  4 mg Intravenous Q6H PRN Janece Canterbury, MD      . oxyCODONE-acetaminophen (PERCOCET/ROXICET) 5-325 MG per tablet 1-2 tablet  1-2 tablet Oral Q4H PRN Janece Canterbury, MD   2 tablet at 10/12/13 0848   And  . oxyCODONE (Oxy IR/ROXICODONE) immediate release tablet 5-10 mg  5-10 mg Oral Q4H PRN Janece Canterbury, MD      . polyethylene glycol (MIRALAX / GLYCOLAX) packet 17 g  17 g Oral Daily PRN Janece Canterbury, MD      . potassium chloride 10 mEq in 100 mL IVPB  10 mEq Intravenous Q1 Hr x 4 Sid Falcon, MD   10 mEq at 10/12/13 1552  . risperiDONE (RISPERDAL) tablet 1 mg  1 mg Oral QHS Janece Canterbury, MD   1 mg at 10/11/13 2102  . traZODone (DESYREL) tablet 100  mg  100 mg Oral QHS PRN Janece Canterbury, MD      . warfarin (COUMADIN) tablet 5 mg  5 mg Oral ONCE-1800 Randall K Absher, RPH      . Warfarin - Pharmacist Dosing Inpatient   Does not apply q1800 Emiliano Dyer, [facility]        Psychiatric Specialty Exam:     Blood pressure 112/73, pulse 110, temperature 98.2 F (36.8 C), temperature source  Oral, resp. rate 18, height $RemoveBe'5\' 6"'SbkfCHNvr$  (1.676 m), weight 153 lb 14.1 oz (69.8 kg), SpO2 100.00%.Body mass index is 24.85 kg/(m^2).  General Appearance: Bizarre and Guarded  Eye Contact::  Poor  Speech:  Blocked and Rambling  Volume:  Increased  Mood:  Euphoric and Irritable  Affect:  Inappropriate and Labile  Thought Process:  Circumstantial, Disorganized, Irrelevant, Loose and Tangential  Orientation:  Full (Time, Place, and Person)  Thought Content:  Hallucinations: Patient seen talking to herself.  It appears that she is responding to internal stimuli, Ideas of Reference:   Paranoia and Paranoid Ideation  Suicidal Thoughts:  No  Homicidal Thoughts:  No  Memory:  Immediate;   Poor Recent;   Poor Remote;   Poor  Judgement:  Impaired  Insight:  Lacking  Psychomotor Activity:  Increased  Concentration:  Poor  Recall:  Poor  Fund of Knowledge:Poor  Language: Poor  Akathisia:  No  Handed:  Right  AIMS (if indicated):     Assets:  Housing  Sleep:      Musculoskeletal: Strength & Muscle Tone: within normal limits Gait & Station: Patient is lying on the bed Patient leans: N/A  Treatment Plan Summary: Medication management, start Risperdal 3 mg at bedtime, Cogentin 1 mg at bedtime.  Monitor closely for side effects including EPS.  Patient required a sitter for safety.  Consultation liaison service will followup the patient.  Please call [phone] for continued followup.  Bertran Zeimet T. 10/12/2013 3:56 PM

## 2013-10-12 NOTE — Progress Notes (Addendum)
Nutrition Brief Note  Patient identified on the Malnutrition Screening Tool (MST) Report  Wt Readings from Last 15 Encounters:  10/11/13 153 lb 14.1 oz (69.8 kg)  06/02/13 162 lb (73.483 kg)  01/27/13 150 lb (68.04 kg)  01/25/13 153 lb 14.1 oz (69.8 kg)  01/06/13 158 lb (71.668 kg)  12/18/12 158 lb (71.668 kg)  10/10/11 173 lb (78.472 kg)  09/19/11 222 lb (100.699 kg)  09/14/11 189 lb (85.73 kg)  08/24/11 188 lb 14.4 oz (85.684 kg)  12/25/10 180 lb (81.647 kg)  11/22/10 182 lb 12.8 oz (82.918 kg)   Patient admitted with schizophrenia.  Weight appears to be stable for the past 9 months.  Body mass index is 24.85 kg/(m^2). Patient meets criteria for normal weight based on current BMI.   Current diet order is regular, patient is consuming approximately 100% of meals at this time. Labs and medications reviewed.   Patient upset that she is here instead of a psychiatric hospital where she can walk.  States that she does not have her teeth and needs soft foods.  Will downgrade diet to dysphagia 3.  If nutrition issues arise, please consult RD.   Antonieta Iba, RD, LDN Clinical Inpatient Dietitian Pager:  [phone] Weekend and after hours pager:  [phone]

## 2013-10-12 NOTE — Progress Notes (Signed)
Nt  in room and charge nurse due to patient bed alarm going off nurse tech and charge nurse trying to get pt to be claim and stay in bed. Nt attempts to take blood pressure after 2 attempts patient wouldn't  keep arm still  After telling pt to keep arm still.

## 2013-10-13 DIAGNOSIS — F1210 Cannabis abuse, uncomplicated: Secondary | ICD-10-CM

## 2013-10-13 DIAGNOSIS — F17200 Nicotine dependence, unspecified, uncomplicated: Secondary | ICD-10-CM

## 2013-10-13 DIAGNOSIS — F209 Schizophrenia, unspecified: Secondary | ICD-10-CM

## 2013-10-13 LAB — BASIC METABOLIC PANEL
Anion gap: 11 (ref 5–15)
BUN: 9 mg/dL (ref 6–23)
CO2: 22 mEq/L (ref 19–32)
Calcium: 8.5 mg/dL (ref 8.4–10.5)
Chloride: 107 mEq/L (ref 96–112)
Creatinine, Ser: 0.91 mg/dL (ref 0.50–1.10)
GFR, EST AFRICAN AMERICAN: 88 mL/min — AB (ref >90)
GFR, EST NON AFRICAN AMERICAN: 76 mL/min — AB (ref >90)
Glucose, Bld: 92 mg/dL (ref 70–99)
POTASSIUM: 3.8 meq/L (ref 3.7–5.3)
Sodium: 140 mEq/L (ref 137–147)

## 2013-10-13 LAB — CBC
HCT: 36.5 % (ref 36.0–46.0)
Hemoglobin: 12 g/dL (ref 12.0–15.0)
MCH: 30.9 pg (ref 26.0–34.0)
MCHC: 32.9 g/dL (ref 30.0–36.0)
MCV: 94.1 fL (ref 78.0–100.0)
PLATELETS: 306 10*3/uL (ref 150–400)
RBC: 3.88 MIL/uL (ref 3.87–5.11)
RDW: 14 % (ref 11.5–15.5)
WBC: 7 10*3/uL (ref 4.0–10.5)

## 2013-10-13 LAB — PROTIME-INR
INR: 2.86 — ABNORMAL HIGH (ref 0.00–1.49)
PROTHROMBIN TIME: 30 s — AB (ref 11.6–15.2)

## 2013-10-13 MED ORDER — BENZTROPINE MESYLATE 1 MG PO TABS: 1.0000 mg | ORAL_TABLET | Freq: Every day | ORAL | Status: DC

## 2013-10-13 MED ORDER — RISPERIDONE 3 MG PO TABS: 3.0000 mg | ORAL_TABLET | Freq: Every day | ORAL | Status: DC

## 2013-10-13 MED ORDER — DIAZEPAM 5 MG PO TABS: 5.0000 mg | ORAL_TABLET | Freq: Two times a day (BID) | ORAL | Status: DC | PRN

## 2013-10-13 NOTE — Progress Notes (Signed)
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  She did have a fall with a lip laceration.  She has also had decreased PO intake.   Assessment/plan:   Schizophrenia with hallucinations, agitation - Psychiatry has been consulted - risperidone 3mg  qhs started last night - Trazadone restarted on admission, cogentin also started last night - Patient much more alert and talkative this morning.  She is upset at her boyfriend for having a "new girlfriend" on facebook.  - Valium and ativan PRN for agitation - CT head normal  Sinus tachycardia, dehydration - Improved today, no acute signs/symptoms of PE - Will stop fluids as patient is eating and drinking  Leukocytosis, likely due to dehydration - Returned to normal with fluids, no fever or signs of infection.   Metabolic acidosis, increased anion gap - Resolved  AKI (acute kidney injury) - Resolved  Pulmonary emboli - Coumadin per pharmacy - INR 2.86 today - stop lovenox bridge  Depression/Anxiety - Valium and prn ativan   Memory problems - HIV negative, RPR negative, TSH WNL, Pregnancy negative - Tox screen + for THC, opiates and BZDs  Chronic pain - oxycodone and flexeril as needed    Tobacco abuse - Nicotine patch     Consultants:  Psychiatry  Procedures/Studies: Dg Chest 2 View  10/11/2013   CLINICAL DATA:  [place] off bed, history uterine cancer, pulmonary embolism, smoker  EXAM: CHEST  2 VIEW  COMPARISON:  12/17/2012  FINDINGS: Normal heart size, mediastinal contours and pulmonary vascularity.  Minimal chronic peribronchial thickening and slight chronic accentuation of basilar interstitial  markings, stable.  No acute infiltrate, pleural effusion or pneumothorax.  Bones unremarkable.  IMPRESSION: Minimal chronic bronchitic changes and basilar interstitial disease.  No acute abnormalities.   Electronically Signed   By: Lavonia Maigan M.D.   On: 10/11/2013 15:08   Ct Head Wo Contrast  10/11/2013   CLINICAL DATA:  [place] off bed, lip laceration, history of schizophrenia, PTSD, uterine cancer  EXAM: CT HEAD WITHOUT CONTRAST  TECHNIQUE: Contiguous axial images were obtained from the base of the skull through the vertex without intravenous contrast.  COMPARISON:  12/22/2012  FINDINGS: Normal ventricular morphology.  No midline shift or mass effect.  Normal appearance of brain parenchyma.  No intracranial hemorrhage, mass lesion, or acute infarction.  Visualized paranasal sinuses and mastoid air cells clear.  Bones unremarkable.  IMPRESSION: Normal exam.   Electronically Signed   By: Lavonia Zaydee M.D.   On: 10/11/2013 15:07      Antibiotics:  None currently  Code Status: Full Family Communication: Patient under IVC, boyfriend at bedside Disposition Plan: Likely will need inpatient psych, patient is medically cleared to go to inpatient psych if appropriate  HPI/Subjective: [name] is much more interactive today.  She still remembers having multiple people come see her yesterday, however, nursing noted no visitors.    Objective: Filed Vitals:   10/11/13 2216 10/12/13 0435 10/12/13 2129 10/13/13 0519  BP: 108/69 112/73 93/62 95/59   Pulse: 95 110 91 93  Temp:  98.2 F (36.8 C) 97.5 F (36.4 C) 97.8 F (36.6 C)  TempSrc: Oral Oral Oral Oral  Resp: 18 18 18 18   Height:      Weight:  SpO2: 93% 100% 94% 93%    Intake/Output Summary (Last 24 hours) at 10/13/13 1300 Last data filed at 10/13/13 0948  Gross per 24 hour  Intake    360 ml  Output      0 ml  Net    360 ml    Exam:   General:  Pt is alert, follows commands, agitated by her boyfriends new "girlfriend"    Cardiovascular: Regular rate, normal rhythm, S1/S2, no murmurs  Respiratory: Clear to auscultation bilaterally, no increased WOB  Abdomen: Soft, non tender, non distended, + BS  Extremities: No edema  Neuro: Grossly nonfocal  Data Reviewed: Basic Metabolic Panel:  Recent Labs Lab 10/11/13 1405 10/12/13 0543 10/13/13 0525  NA 131* 139 140  K 3.8 3.2* 3.8  CL 91* 100 107  CO2 17* 23 22  GLUCOSE 125* 101* 92  BUN 22 17 9   CREATININE 1.47* 1.08 0.91  CALCIUM 9.2 8.9 8.5   CBC:  Recent Labs Lab 10/11/13 1405 10/12/13 0543 10/13/13 0525  WBC 19.0* 9.1 7.0  HGB 15.1* 13.7 12.0  HCT 41.6 39.5 36.5  MCV 88.3 90.8 94.1  PLT 407* 371 306      Scheduled Meds: . aspirin EC  81 mg Oral Daily  . benztropine  1 mg Oral QHS  . enoxaparin (LOVENOX) injection  70 mg Subcutaneous Q12H  . nicotine  42 mg Transdermal Daily  . risperiDONE  3 mg Oral QHS  . Warfarin - Pharmacist Dosing Inpatient   Does not apply q1800   Continuous Infusions:     Gilles Chiquito, MD  [facility] Pager [phone]  If 7PM-7AM, please contact night-coverage [email or web address] Password [email or web address] 10/13/2013, 1:00 PM   LOS: 2 days
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
## 2013-10-13 NOTE — Consult Note (Signed)
[facility] Face-to-Face Psychiatry Consult   Reason for Consult:  history of schizophrenia and unable to care for herself Referring Physician:  Dr Sheran Fava  [name] is an 43 y.o. female. Total Time spent with patient: 20 minutes  Assessment: AXIS I:  Schizophrenia chronic paranoid type AXIS II:  Deferred AXIS III:   Past Medical History  Diagnosis Date  . Pulmonary emboli   . Schizophrenia   . Cancer   . Uterus cancer   . Anxiety   . Depression   . PTSD (post-traumatic stress disorder)    AXIS IV:  other psychosocial or environmental problems and problems related to social environment AXIS V:  21-30 behavior considerably influenced by delusions or hallucinations OR serious impairment in judgment, communication OR inability to function in almost all areas  Plan:  No evidence of imminent risk to self or others at present.   Patient does not meet criteria for psychiatric inpatient admission. Supportive therapy provided about ongoing stressors. Discussed crisis plan, support from social network, calling 911, coming to the Emergency Department, and calling Suicide Hotline. Will refer to outpatient psychiatric services and will sign off at this time.  Subjective:   [name] is a 43 y.o. female patient admitted with the fall and unable to care for herself.Marland Kitchen  HPI:  Patient seen and chart reviewed and case discussed with the social service.  Psychiatric consult is requested for followup visit. Patient is a poor historian and she did not offer a lot of information. Patient stated that she has been stable over 9 months without episodes of emotional and anxiety episodes. Patient has history of paranoid schizophrenia and she was discharged from behavioral [place] in November 2014 with a recommendation to continue Risperdal 3 mg at bedtime, Cogentin and trazodone. She has been receiving outpatient medication management [facility] for followup. Patient told that she does not have any psychiatric  illness and most of her medications is managed by her primary care physician. The patient believed that she does not have any psychiatric illness and she only needed to take Valium and Vicodin. Recently she felll and she has lip laceration. She denies active or passive suicidal thoughts or homicidal thoughts. Patient is calm and cooperative. Her UDS was positive for marijuana, opiates and benzodiazepine. Patient lives with her significant other. Patient contracts for safety and willing to follow up outpatient psychiatric services at this time.   Past Psychiatric History: Past Medical History  Diagnosis Date  . Pulmonary emboli   . Schizophrenia   . Cancer   . Uterus cancer   . Anxiety   . Depression   . PTSD (post-traumatic stress disorder)     reports that she has been smoking Cigarettes.  She has a 60 pack-year smoking history. She has never used smokeless tobacco. She reports that she drinks alcohol. She reports that she uses illicit drugs (Marijuana). Family History  Problem Relation Age of Onset  . Schizophrenia Neg Hx      Living Arrangements: Spouse/significant other   Abuse/Neglect [facility]) Physical Abuse: Denies Verbal Abuse: Denies Sexual Abuse: Denies Allergies:   Allergies  Allergen Reactions  . Darvocet [Propoxyphene N-Acetaminophen] Nausea And Vomiting and Other (See Comments)    Shaking     ACT Assessment Complete:  No:   Past Psychiatric History: Patient has history of schizophrenia paranoid type.  She was last admitted to behavioral [place] in November 2014.  Objective: Blood pressure 95/59, pulse 93, temperature 97.8 F (36.6 C), temperature source Oral, resp. rate  18, height $RemoveBe'5\' 6"'ytscKIUqq$  (1.676 m), weight 69.8 kg (153 lb 14.1 oz), SpO2 93.00%.Body mass index is 24.85 kg/(m^2). Results for orders placed during the hospital encounter of 10/11/13 (from the past 72 hour(s))  CBC     Status: Abnormal   Collection Time    10/11/13  2:05 PM      Result Value Ref  Range   WBC 19.0 (*) 4.0 - 10.5 K/uL   RBC 4.71  3.87 - 5.11 MIL/uL   Hemoglobin 15.1 (*) 12.0 - 15.0 g/dL   HCT 41.6  36.0 - 46.0 %   MCV 88.3  78.0 - 100.0 fL   MCH 32.1  26.0 - 34.0 pg   MCHC 36.3 (*) 30.0 - 36.0 g/dL   RDW 13.1  11.5 - 15.5 %   Platelets 407 (*) 150 - 400 K/uL  BASIC METABOLIC PANEL     Status: Abnormal   Collection Time    10/11/13  2:05 PM      Result Value Ref Range   Sodium 131 (*) 137 - 147 mEq/L   Potassium 3.8  3.7 - 5.3 mEq/L   Chloride 91 (*) 96 - 112 mEq/L   CO2 17 (*) 19 - 32 mEq/L   Glucose, Bld 125 (*) 70 - 99 mg/dL   BUN 22  6 - 23 mg/dL   Creatinine, Ser 1.47 (*) 0.50 - 1.10 mg/dL   Calcium 9.2  8.4 - 10.5 mg/dL   GFR calc non Af Amer 42 (*) >90 mL/min   GFR calc Af Amer 49 (*) >90 mL/min   Comment: (NOTE)     The eGFR has been calculated using the CKD EPI equation.     This calculation has not been validated in all clinical situations.     eGFR's persistently <90 mL/min signify possible Chronic Kidney     Disease.   Anion gap 23 (*) 5 - 15  ETHANOL     Status: None   Collection Time    10/11/13  2:05 PM      Result Value Ref Range   Alcohol, Ethyl (B) <11  0 - 11 mg/dL   Comment:            LOWEST DETECTABLE LIMIT FOR     SERUM ALCOHOL IS 11 mg/dL     FOR MEDICAL PURPOSES ONLY  SALICYLATE LEVEL     Status: Abnormal   Collection Time    10/11/13  2:05 PM      Result Value Ref Range   Salicylate Lvl <3.3 (*) 2.8 - 20.0 mg/dL  ACETAMINOPHEN LEVEL     Status: None   Collection Time    10/11/13  2:05 PM      Result Value Ref Range   Acetaminophen (Tylenol), Serum <15.0  10 - 30 ug/mL   Comment:            THERAPEUTIC CONCENTRATIONS VARY     SIGNIFICANTLY. A RANGE OF 10-30     ug/mL MAY BE AN EFFECTIVE     CONCENTRATION FOR MANY PATIENTS.     HOWEVER, SOME ARE BEST TREATED     AT CONCENTRATIONS OUTSIDE THIS     RANGE.     ACETAMINOPHEN CONCENTRATIONS     >150 ug/mL AT 4 HOURS AFTER     INGESTION AND >50 ug/mL AT 12     HOURS  AFTER INGESTION ARE     OFTEN ASSOCIATED WITH TOXIC     REACTIONS.  PROTIME-INR  Status: Abnormal   Collection Time    10/11/13  3:07 PM      Result Value Ref Range   Prothrombin Time 18.1 (*) 11.6 - 15.2 seconds   INR 1.50 (*) 0.00 - 1.49  URINE RAPID DRUG SCREEN (HOSP PERFORMED)     Status: Abnormal   Collection Time    10/11/13  4:35 PM      Result Value Ref Range   Opiates POSITIVE (*) NONE DETECTED   Cocaine NONE DETECTED  NONE DETECTED   Benzodiazepines POSITIVE (*) NONE DETECTED   Amphetamines NONE DETECTED  NONE DETECTED   Tetrahydrocannabinol POSITIVE (*) NONE DETECTED   Barbiturates NONE DETECTED  NONE DETECTED   Comment:            DRUG SCREEN FOR MEDICAL PURPOSES     ONLY.  IF CONFIRMATION IS NEEDED     FOR ANY PURPOSE, NOTIFY LAB     WITHIN 5 DAYS.                LOWEST DETECTABLE LIMITS     FOR URINE DRUG SCREEN     Drug Class       Cutoff (ng/mL)     Amphetamine      1000     Barbiturate      200     Benzodiazepine   923     Tricyclics       300     Opiates          300     Cocaine          300     THC              50  URINALYSIS, ROUTINE W REFLEX MICROSCOPIC     Status: Abnormal   Collection Time    10/11/13  4:35 PM      Result Value Ref Range   Color, Urine AMBER (*) YELLOW   Comment: BIOCHEMICALS MAY BE AFFECTED BY COLOR   APPearance CLOUDY (*) CLEAR   Specific Gravity, Urine 1.024  1.005 - 1.030   pH 5.5  5.0 - 8.0   Glucose, UA NEGATIVE  NEGATIVE mg/dL   Hgb urine dipstick NEGATIVE  NEGATIVE   Bilirubin Urine SMALL (*) NEGATIVE   Ketones, ur 15 (*) NEGATIVE mg/dL   Protein, ur 30 (*) NEGATIVE mg/dL   Urobilinogen, UA 0.2  0.0 - 1.0 mg/dL   Nitrite NEGATIVE  NEGATIVE   Leukocytes, UA NEGATIVE  NEGATIVE  URINE MICROSCOPIC-ADD ON     Status: Abnormal   Collection Time    10/11/13  4:35 PM      Result Value Ref Range   Squamous Epithelial / LPF RARE  RARE   Casts HYALINE CASTS (*) NEGATIVE   Urine-Other AMORPHOUS URATES/PHOSPHATES     PREGNANCY, URINE     Status: None   Collection Time    10/11/13  4:51 PM      Result Value Ref Range   Preg Test, Ur NEGATIVE  NEGATIVE   Comment:            THE SENSITIVITY OF THIS     METHODOLOGY IS >20 mIU/mL.  I-STAT CG4 LACTIC ACID, ED     Status: None   Collection Time    10/11/13  5:04 PM      Result Value Ref Range   Lactic Acid, Venous 1.29  0.5 - 2.2 mmol/L  CBC     Status: None   Collection Time  10/12/13  5:43 AM      Result Value Ref Range   WBC 9.1  4.0 - 10.5 K/uL   RBC 4.35  3.87 - 5.11 MIL/uL   Hemoglobin 13.7  12.0 - 15.0 g/dL   HCT 39.5  36.0 - 46.0 %   MCV 90.8  78.0 - 100.0 fL   MCH 31.5  26.0 - 34.0 pg   MCHC 34.7  30.0 - 36.0 g/dL   RDW 13.4  11.5 - 15.5 %   Platelets 371  150 - 400 K/uL  BASIC METABOLIC PANEL     Status: Abnormal   Collection Time    10/12/13  5:43 AM      Result Value Ref Range   Sodium 139  137 - 147 mEq/L   Comment: DELTA CHECK NOTED     REPEATED TO VERIFY   Potassium 3.2 (*) 3.7 - 5.3 mEq/L   Chloride 100  96 - 112 mEq/L   Comment: DELTA CHECK NOTED     REPEATED TO VERIFY   CO2 23  19 - 32 mEq/L   Glucose, Bld 101 (*) 70 - 99 mg/dL   BUN 17  6 - 23 mg/dL   Creatinine, Ser 1.08  0.50 - 1.10 mg/dL   Calcium 8.9  8.4 - 10.5 mg/dL   GFR calc non Af Amer 61 (*) >90 mL/min   GFR calc Af Amer 71 (*) >90 mL/min   Comment: (NOTE)     The eGFR has been calculated using the CKD EPI equation.     This calculation has not been validated in all clinical situations.     eGFR's persistently <90 mL/min signify possible Chronic Kidney     Disease.   Anion gap 16 (*) 5 - 15  TSH     Status: None   Collection Time    10/12/13  5:43 AM      Result Value Ref Range   TSH 1.120  0.350 - 4.500 uIU/mL   Comment: Performed at [facility]  HIV ANTIBODY (ROUTINE TESTING)     Status: None   Collection Time    10/12/13  5:43 AM      Result Value Ref Range   HIV 1&2 Ab, 4th Generation NONREACTIVE  NONREACTIVE   Comment: (NOTE)      A NONREACTIVE HIV Ag/Ab result does not exclude HIV infection since     the time frame for seroconversion is variable. If acute HIV infection     is suspected, a HIV-1 RNA Qualitative TMA test is recommended.     HIV-1/2 Antibody Diff         Not indicated.     HIV-1 RNA, Qual TMA           Not indicated.     PLEASE NOTE: This information has been disclosed to you from records     whose confidentiality may be protected by state law. If your state     requires such protection, then the state law prohibits you from making     any further disclosure of the information without the specific written     consent of the person to whom it pertains, or as otherwise permitted     by law. A general authorization for the release of medical or other     information is NOT sufficient for this purpose.     The performance of this assay has not been clinically validated in     patients less than 2  years old.     Performed at [place]     Status: None   Collection Time    10/12/13  5:43 AM      Result Value Ref Range   Vitamin B-12 615  211 - 911 pg/mL   Comment: Performed at [facility]  RPR     Status: None   Collection Time    10/12/13  5:43 AM      Result Value Ref Range   RPR NON REAC  NON REAC   Comment: Performed at [place]     Status: Abnormal   Collection Time    10/12/13  5:43 AM      Result Value Ref Range   Prothrombin Time 20.0 (*) 11.6 - 15.2 seconds   INR 1.70 (*) 0.00 - 1.49  PROTIME-INR     Status: Abnormal   Collection Time    10/13/13  5:25 AM      Result Value Ref Range   Prothrombin Time 30.0 (*) 11.6 - 15.2 seconds   INR 2.86 (*) 0.00 - 4.58  BASIC METABOLIC PANEL     Status: Abnormal   Collection Time    10/13/13  5:25 AM      Result Value Ref Range   Sodium 140  137 - 147 mEq/L   Potassium 3.8  3.7 - 5.3 mEq/L   Chloride 107  96 - 112 mEq/L   CO2 22  19 - 32 mEq/L   Glucose, Bld 92  70 - 99 mg/dL   BUN 9   6 - 23 mg/dL   Creatinine, Ser 0.91  0.50 - 1.10 mg/dL   Calcium 8.5  8.4 - 10.5 mg/dL   GFR calc non Af Amer 76 (*) >90 mL/min   GFR calc Af Amer 88 (*) >90 mL/min   Comment: (NOTE)     The eGFR has been calculated using the CKD EPI equation.     This calculation has not been validated in all clinical situations.     eGFR's persistently <90 mL/min signify possible Chronic Kidney     Disease.   Anion gap 11  5 - 15  CBC     Status: None   Collection Time    10/13/13  5:25 AM      Result Value Ref Range   WBC 7.0  4.0 - 10.5 K/uL   RBC 3.88  3.87 - 5.11 MIL/uL   Hemoglobin 12.0  12.0 - 15.0 g/dL   HCT 36.5  36.0 - 46.0 %   MCV 94.1  78.0 - 100.0 fL   MCH 30.9  26.0 - 34.0 pg   MCHC 32.9  30.0 - 36.0 g/dL   RDW 14.0  11.5 - 15.5 %   Platelets 306  150 - 400 K/uL   Labs are reviewed.  Current Facility-Administered Medications  Medication Dose Route Frequency Provider Last Rate Last Dose  . acetaminophen (TYLENOL) tablet 650 mg  650 mg Oral Q6H PRN Janece Canterbury, MD   650 mg at 10/11/13 2104   Or  . acetaminophen (TYLENOL) suppository 650 mg  650 mg Rectal Q6H PRN Janece Canterbury, MD      . alum & mag hydroxide-simeth (MAALOX/MYLANTA) 200-200-20 MG/5ML suspension 30 mL  30 mL Oral Q6H PRN Janece Canterbury, MD      . aspirin EC tablet 81 mg  81 mg Oral Daily Janece Canterbury, MD   81 mg at 10/13/13 0906  .  benztropine (COGENTIN) tablet 1 mg  1 mg Oral QHS Sid Falcon, MD   1 mg at 10/12/13 2149  . diazepam (VALIUM) tablet 5 mg  5 mg Oral TID PRN Janece Canterbury, MD   5 mg at 10/13/13 0134  . enoxaparin (LOVENOX) injection 70 mg  70 mg Subcutaneous Q12H Emiliano Dyer, RPH   70 mg at 10/13/13 1638  . LORazepam (ATIVAN) injection 1-2 mg  1-2 mg Intravenous Q4H PRN Sid Falcon, MD   2 mg at 10/13/13 0243  . nicotine (NICODERM CQ - dosed in mg/24 hours) patch 42 mg  42 mg Transdermal Daily Janece Canterbury, MD   42 mg at 10/13/13 0906  . ondansetron (ZOFRAN) tablet 4 mg  4 mg  Oral Q6H PRN Janece Canterbury, MD       Or  . ondansetron (ZOFRAN) injection 4 mg  4 mg Intravenous Q6H PRN Janece Canterbury, MD      . oxyCODONE-acetaminophen (PERCOCET/ROXICET) 5-325 MG per tablet 1-2 tablet  1-2 tablet Oral Q4H PRN Janece Canterbury, MD   2 tablet at 10/13/13 0134   And  . oxyCODONE (Oxy IR/ROXICODONE) immediate release tablet 5-10 mg  5-10 mg Oral Q4H PRN Janece Canterbury, MD      . polyethylene glycol (MIRALAX / GLYCOLAX) packet 17 g  17 g Oral Daily PRN Janece Canterbury, MD      . risperiDONE (RISPERDAL) tablet 3 mg  3 mg Oral QHS Sid Falcon, MD   3 mg at 10/12/13 2145  . traZODone (DESYREL) tablet 100 mg  100 mg Oral QHS PRN Janece Canterbury, MD      . Warfarin - Pharmacist Dosing Inpatient   Does not apply q1800 Emiliano Dyer, [facility]        Psychiatric Specialty Exam:     Blood pressure 95/59, pulse 93, temperature 97.8 F (36.6 C), temperature source Oral, resp. rate 18, height $RemoveBe'5\' 6"'[id number]$  (1.676 m), weight 69.8 kg (153 lb 14.1 oz), SpO2 93.00%.Body mass index is 24.85 kg/(m^2).  General Appearance: Bizarre  Eye Contact::  Fair  Speech:  Blocked  Volume:  Normal  Mood:  Euthymic  Affect:  Congruent  Thought Process:  Circumstantial and Disorganized  Orientation:  Full (Time, Place, and Person)  Thought Content:  Paranoid Ideation and Rumination  Suicidal Thoughts:  No  Homicidal Thoughts:  No  Memory:  Immediate;   Fair Recent;   Fair Remote;   Fair  Judgement:  Intact  Insight:  Fair  Psychomotor Activity:  Normal  Concentration:  Fair  Recall:  [employer] of Knowledge:Fair  Language: Fair  Akathisia:  No  Handed:  Right  AIMS (if indicated):     Assets:  Housing  Sleep:      Musculoskeletal: Strength & Muscle Tone: within normal limits Gait & Station: Patient is lying on the bed Patient leans: N/A  Treatment Plan Summary: Medication management Continue Risperdal 3 mg at bedtime, Cogentin 1 mg at bedtime.   Monitor closely for side effects  including EPS.   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  Consultation liaison service will sign off at this time. Please call [phone] for further assistance needed.  Sarea Fyfe,JANARDHAHA R. 10/13/2013 1:04 PM

## 2013-10-13 NOTE — Progress Notes (Signed)
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 Darvocet [Propoxyphene N-Acetaminophen] Nausea And Vomiting and Other (See Comments)    Shaking     Patient Measurements: Height: 5\' 6"  (167.6 cm) Weight: 153 lb 14.1 oz (69.8 kg) IBW/kg (Calculated) : 59.3  Vital Signs: Temp: 97.8 F (36.6 C) (07/14 0519) Temp src: Oral (07/14 0519) BP: 95/59 mmHg (07/14 0519) Pulse Rate: 93 (07/14 0519)  Labs:  Recent Labs  10/11/13 1405 10/11/13 1507 10/12/13 0543 10/13/13 0525  HGB 15.1*  --  13.7 12.0  HCT 41.6  --  39.5 36.5  PLT 407*  --  371 306  LABPROT  --  18.1* 20.0* 30.0*  INR  --  1.50* 1.70* 2.86*  CREATININE 1.47*  --  1.08 0.91    Estimated Creatinine Clearance: 73.9 ml/min (by C-G formula based on Cr of 0.91).   Medical History: Past Medical History  Diagnosis Date  . Pulmonary emboli   . Schizophrenia   . Cancer   . Uterus cancer   . Anxiety   . Depression   . PTSD (post-traumatic stress disorder)     Medications:  Scheduled:  . aspirin EC  81 mg Oral Daily  . benztropine  1 mg Oral QHS  . enoxaparin (LOVENOX) injection  70 mg Subcutaneous Q12H  . nicotine  42 mg Transdermal Daily  . risperiDONE  3 mg Oral QHS  . Warfarin - Pharmacist Dosing Inpatient   Does not apply q1800   Infusions:  . sodium chloride 125 mL/hr at 10/12/13 1812   PRN: acetaminophen, acetaminophen, alum & mag hydroxide-simeth, diazepam, LORazepam, ondansetron (ZOFRAN) IV, ondansetron, oxyCODONE, oxyCODONE-acetaminophen, polyethylene glycol, traZODone  Inpatient warfarin doses administered: 7/12 - 10mg , 7/13 - 5mg   Assessment: 43 yo female on chronic warfarin for history PE. Pharmacy consulted to continue dosing upon admission and add lovenox until INR therapeutic. Dose PTA = 5mg  daily except 7.5mg  sundays - last taken 7/11.  INR subtherapeutic (1.5) on  admission 7/12.  INR today is therapeutic 2.86     CBC WNL Drug Interactions: none noted Diet: pt on oral diet but no intake documented in EPIC  Goal of Therapy:  INR 2-3 Monitor platelets by anticoagulation protocol: Yes   7/14:  INR therapeutic but jumped from 1.7 to 2.86. Concern for SUPRAtherapeutic INR tomorrow.  SCr improved, CrCl > 30 mL/min  H/H and pltc WNL.  No bleeding reported.  Remains on full-dose LMWH  Concomitant ASA noted   Plan:   Hold warfarin dose for today  Continue Lovenox 70 mg SQ q12h  Follow PT/INR daily while inpatient.  Monitor renal function, CBC, signs/symptoms of bleeding  Kizzie Furnish, PharmD Pager: [phone] 10/13/2013 7:46 AM

## 2013-10-13 NOTE — Progress Notes (Signed)
Discharge instructions and medications reviewed with patient and friend. Pt and friend verbalize understanding. Patient personal belongings returned to patient. Patient money will be returned when security available to assist.

## 2013-10-13 NOTE — Progress Notes (Signed)
Clinical Social Work  Per psych MD, patient can follow up on outpatient basis and no longer needs inpatient psych placement. CSW met with patient at bedside and provided outpatient referrals as well as Mobile Crisis number. Patient reports that she is going to try to convince boyfriend to go to therapy with her but if they cannot work on their relationship then she plans to leave him. Patient requested information for housing. Patient has disability check so CSW provided patient with low income housing list. Patient reports boyfriend will provide transportation home at DC.  CSW made MD aware of psych MD recommendations. MD signed "Notice of Commitment Change" forms to rescind IVC which was faxed to [facility].   CSW is signing off but available if needed.  [place], [place] [phone]

## 2013-10-13 NOTE — Discharge Instructions (Addendum)
[name] - -   You were admitted due to confusion and dehydration.  Your kidneys were injured, but they improved with fluids.  You also had many of the salts in your blood (sodium, potassium) which were abnormal, and this improved with fluids.  You had the psychiatrist see you and he recommended that you take Risperdal 3mg  at night along with cogentin 1mg  at night.  These will be prescribed for you.  You also were given Valium for anxiety, you will have a small number of pills for this and should get a chronic prescription from your primary doctor or psychiatrist.  Please continue taking your coumadin as you have previously been taking it.  You should also continue taking flexeril as needed.    Information about your new medications is listed below.   You have been given community resources for mental health, please make an appointment with one of these groups to follow you for your mental health issues.   If you should have return of any of your symptoms, inability to drink or eat normally, uncontrollable pain, nausea, vomiting, diarrhea, chest pain or fevers, chills please come back to the hospital.   Risperidone tablets What is this medicine? RISPERIDONE (ris PER i done) is an antipsychotic. It is used to treat schizophrenia, bipolar disorder, and some symptoms of autism. This medicine may be used for other purposes; ask your health care provider or pharmacist if you have questions. COMMON BRAND NAME(S): Risperdal What should I tell my health care provider before I take this medicine? They need to know if you have any of these conditions: -blood disorder or disease -dementia -diabetes or a family history of diabetes -difficulty swallowing -heart disease or previous heart attack -history of brain tumor or head injury -history of breast cancer -irregular heartbeat or low blood pressure -kidney or liver disease -Parkinson's disease -seizures (convulsions) -an unusual or allergic reaction  to risperidone, other medicines, foods, dyes, or preservatives -pregnant or trying to get pregnant -breast-feeding How should I use this medicine? Take this medicine by mouth with a glass of water. Follow the directions on the prescription label. You can take it with or without food. If it upsets your stomach, take it with food. Take your medicine at regular intervals. Do not take it more often than directed. Do not stop taking except on your doctor's advice. Talk to your pediatrician regarding the use of this medicine in children. While this drug may be prescribed for children as young as 34 years of age for selected conditions, precautions do apply. Overdosage: If you think you have taken too much of this medicine contact a poison control center or emergency room at once. NOTE: This medicine is only for you. Do not share this medicine with others. What if I miss a dose? If you miss a dose, take it as soon as you can. If it is almost time for your next dose, take only that dose. Do not take double or extra doses. What may interact with this medicine? Do not take this medicine with any of the following medications: -certain medicines for fungal infections like fluconazole, itraconazole, ketoconazole, posaconazole, voriconazole -cisapride -dofetilide -dronedarone -droperidol -pimozide -sparfloxacin -thioridazine -ziprasidone This medicine may also interact with the following medications: -arsenic trioxide -certain antibiotics like clarithromycin, gatifloxacin, levofloxacin, moxifloxacin, pentamidine, rifampin -certain medicines for blood pressure -certain medicines for cancer -certain medicines for irregular heart beat -certain medications for Parkinson's disease like levodopa -certain medicines for seizures like carbamazepine -certain medicines for sleep  or sedation -narcotic medicines for pain -other medicines for mental anxiety, depression, or psychotic disturbances -other medicines  that prolong the QT interval (cause an abnormal heart rhythm) -ritonavir This list may not describe all possible interactions. Give your health care provider a list of all the medicines, herbs, non-prescription drugs, or dietary supplements you use. Also tell them if you smoke, drink alcohol, or use illegal drugs. Some items may interact with your medicine. What should I watch for while using this medicine? Visit your doctor or health care professional for regular checks on your progress. It may be several weeks before you see the full effects. Do not suddenly stop taking this medicine. You may need to gradually reduce the dose. Only stop taking this medicine on the advice of your doctor or health care professional. Dennis Bast may get dizzy or drowsy. Do not drive, use machinery, or do anything that needs mental alertness until you know how this medicine affects you. Do not stand or sit up quickly, especially if you are an older patient. This reduces the risk of dizzy or fainting spells. Alcohol can increase dizziness and drowsiness. Avoid alcoholic drinks. You can get a hangover effect the morning after a bedtime dose. Do not treat yourself for colds, diarrhea or allergies. Ask your doctor or health care professional for advice, some nonprescription medicines may increase possible side effects. This medicine can reduce the response of your body to heat or cold. Dress warm in cold weather and stay hydrated in hot weather. If possible, avoid extreme temperatures like saunas, hot tubs, very hot or cold showers, or activities that can cause dehydration such as vigorous exercise. What side effects may I notice from receiving this medicine? Side effects that you should report to your doctor or health care professional as soon as possible: -aching muscles and joints -confusion -fainting spells -fast or irregular heartbeat -fever or chills, sore throat -increased hunger or thirst -increased urination -loss of  balance, difficulty walking or falls -stiffness, spasms, trembling -uncontrollable head, mouth, neck, arm, or leg movements -unusually weak or tired Side effects that usually do not require medical attention (report to your doctor or health care professional if they continue or are bothersome): -constipation -decreased sexual ability -difficulty sleeping -drowsiness or dizziness -increase or decrease in saliva -nausea, vomiting -weight gain This list may not describe all possible side effects. Call your doctor for medical advice about side effects. You may report side effects to FDA at 1-800-FDA-1088. Where should I keep my medicine? Keep out of the reach of children. Store at room temperature between 15 and 25 degrees C (59 and 77 degrees F). Protect from light. Throw away any unused medicine after the expiration date. NOTE: This sheet is a summary. It may not cover all possible information. If you have questions about this medicine, talk to your doctor, pharmacist, or health care provider.  2015, Elsevier/Gold Standard. (2012-10-13 14:22:54)  Benztropine tablets What is this medicine? BENZTROPINE (BENZ troe peen) is for certain movement problems due to Parkinson's disease, certain medicines, or other causes. This medicine may be used for other purposes; ask your health care provider or pharmacist if you have questions. COMMON BRAND NAME(S): Cogentin What should I tell my health care provider before I take this medicine? They need to know if you have any of these conditions: -glaucoma -heart disease or a rapid heartbeat -mental problems -prostate trouble -tardive dyskinesia -an unusual or allergic reaction to benztropine, other medicines, lactose, foods, dyes, or preservatives -pregnant or trying to  get pregnant -breast-feeding How should I use this medicine? Take this medicine by mouth with a full glass of water. Follow the directions on the prescription label. Take your  medicine at regular intervals. Do not take your medicine more often than directed. Talk to your pediatrician regarding the use of this medicine in children. While this drug may be prescribed for children as young as 3 years for selected conditions, precautions do apply. Overdosage: If you think you have taken too much of this medicine contact a poison control center or emergency room at once. NOTE: This medicine is only for you. Do not share this medicine with others. What if I miss a dose? If you miss a dose, take it as soon as you can. If it is almost time for your next dose, take only that dose. Do not take double or extra doses. What may interact with this medicine? -haloperidol -medicines for movement abnormalities like Parkinson's disease -phenothiazines like chlorpromazine, mesoridazine, prochlorperazine, thioridazine -some antidepressants like amitriptyline, desipramine, doxepin, nortriptyline -stimulant medicines for attention, weight loss, and to stay awake -tegaserod This list may not describe all possible interactions. Give your health care provider a list of all the medicines, herbs, non-prescription drugs, or dietary supplements you use. Also tell them if you smoke, drink alcohol, or use illegal drugs. Some items may interact with your medicine. What should I watch for while using this medicine? Visit your doctor or health care professional for regular checks on your progress. You may get drowsy or dizzy. Do not drive, use machinery, or do anything that needs mental alertness until you know how this medicine affects you. Do not stand or sit up quickly, especially if you are an older patient. This reduces the risk of dizzy or fainting spells. Alcohol may interfere with the effect of this medicine. Avoid alcoholic drinks. Your mouth may get dry. Chewing sugarless gum or sucking hard candy, and drinking plenty of water may help. Contact your doctor if the problem does not go away or is  severe. This medicine may cause dry eyes and blurred vision. If you wear contact lenses you may feel some discomfort. Lubricating drops may help. See your eye doctor if the problem does not go away or is severe. You may sweat less than usual while you are taking this medicine. As a result your body temperature could rise to a dangerous level. Be careful not to get overheated during exercise or in hot weather. You could get heat stroke. Avoid taking hot baths and using hot tubs and saunas. What side effects may I notice from receiving this medicine? Side effects that you should report to your doctor or health care professional as soon as possible: -allergic reactions like skin rash, itching or hives, swelling of the face, lips, or tongue -changes in vision -confusion -decreased sweating or heat intolerance -depression -fast, irregular heartbeat -hallucinations -memory loss -muscle weakness -pain or difficulty passing urine -vomiting Side effects that usually do not require medical attention (report to your doctor or health care professional if they continue or are bothersome): -constipation -dry mouth -nausea This list may not describe all possible side effects. Call your doctor for medical advice about side effects. You may report side effects to FDA at 1-800-FDA-1088. Where should I keep my medicine? Keep out of the reach of children. Store below 30 degrees C (86 degrees F). Keep container tightly closed. Throw away any unused medicine after the expiration date. NOTE: This sheet is a summary. It may not cover  all possible information. If you have questions about this medicine, talk to your doctor, pharmacist, or health care provider.  2015, Elsevier/Gold Standard. (2007-06-18 15:38:20)
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
## 2013-10-16 ENCOUNTER — Encounter (HOSPITAL_COMMUNITY): Payer: Self-pay | Admitting: Emergency Medicine

## 2013-10-16 ENCOUNTER — Emergency Department (HOSPITAL_COMMUNITY): Payer: Medicaid Other

## 2013-10-16 ENCOUNTER — Emergency Department (HOSPITAL_COMMUNITY)
Admission: EM | Admit: 2013-10-16 | Discharge: 2013-10-19 | Disposition: A | Discharge status: Other Acute Inpatient Hospital | Payer: Medicaid Other | Attending: Emergency Medicine | Admitting: Emergency Medicine

## 2013-10-16 DIAGNOSIS — Z3202 Encounter for pregnancy test, result negative: Secondary | ICD-10-CM | POA: Insufficient documentation

## 2013-10-16 DIAGNOSIS — Z8542 Personal history of malignant neoplasm of other parts of uterus: Secondary | ICD-10-CM | POA: Diagnosis not present

## 2013-10-16 DIAGNOSIS — Z86711 Personal history of pulmonary embolism: Secondary | ICD-10-CM | POA: Insufficient documentation

## 2013-10-16 DIAGNOSIS — Y9389 Activity, other specified: Secondary | ICD-10-CM | POA: Diagnosis not present

## 2013-10-16 DIAGNOSIS — F411 Generalized anxiety disorder: Secondary | ICD-10-CM | POA: Diagnosis not present

## 2013-10-16 DIAGNOSIS — F209 Schizophrenia, unspecified: Secondary | ICD-10-CM | POA: Diagnosis not present

## 2013-10-16 DIAGNOSIS — F4310 Post-traumatic stress disorder, unspecified: Secondary | ICD-10-CM | POA: Diagnosis not present

## 2013-10-16 DIAGNOSIS — F911 Conduct disorder, childhood-onset type: Secondary | ICD-10-CM | POA: Insufficient documentation

## 2013-10-16 DIAGNOSIS — IMO0002 Reserved for concepts with insufficient information to code with codable children: Secondary | ICD-10-CM | POA: Diagnosis not present

## 2013-10-16 DIAGNOSIS — F1310 Sedative, hypnotic or anxiolytic abuse, uncomplicated: Secondary | ICD-10-CM | POA: Insufficient documentation

## 2013-10-16 DIAGNOSIS — R4689 Other symptoms and signs involving appearance and behavior: Secondary | ICD-10-CM

## 2013-10-16 DIAGNOSIS — F1110 Opioid abuse, uncomplicated: Secondary | ICD-10-CM | POA: Diagnosis not present

## 2013-10-16 DIAGNOSIS — S60229A Contusion of unspecified hand, initial encounter: Secondary | ICD-10-CM | POA: Insufficient documentation

## 2013-10-16 DIAGNOSIS — F1910 Other psychoactive substance abuse, uncomplicated: Secondary | ICD-10-CM

## 2013-10-16 DIAGNOSIS — Z79899 Other long term (current) drug therapy: Secondary | ICD-10-CM | POA: Insufficient documentation

## 2013-10-16 DIAGNOSIS — F329 Major depressive disorder, single episode, unspecified: Secondary | ICD-10-CM | POA: Diagnosis not present

## 2013-10-16 DIAGNOSIS — F1210 Cannabis abuse, uncomplicated: Secondary | ICD-10-CM | POA: Diagnosis not present

## 2013-10-16 DIAGNOSIS — F1920 Other psychoactive substance dependence, uncomplicated: Secondary | ICD-10-CM | POA: Diagnosis present

## 2013-10-16 DIAGNOSIS — Z7901 Long term (current) use of anticoagulants: Secondary | ICD-10-CM | POA: Diagnosis not present

## 2013-10-16 DIAGNOSIS — F3289 Other specified depressive episodes: Secondary | ICD-10-CM | POA: Diagnosis not present

## 2013-10-16 DIAGNOSIS — Y9210 Unspecified residential institution as the place of occurrence of the external cause: Secondary | ICD-10-CM | POA: Diagnosis not present

## 2013-10-16 DIAGNOSIS — F22 Delusional disorders: Secondary | ICD-10-CM | POA: Insufficient documentation

## 2013-10-16 DIAGNOSIS — F17200 Nicotine dependence, unspecified, uncomplicated: Secondary | ICD-10-CM | POA: Diagnosis not present

## 2013-10-16 DIAGNOSIS — F259 Schizoaffective disorder, unspecified: Secondary | ICD-10-CM

## 2013-10-16 LAB — COMPREHENSIVE METABOLIC PANEL
ALT: 31 U/L (ref 0–35)
AST: 47 U/L — AB (ref 0–37)
Albumin: 4.3 g/dL (ref 3.5–5.2)
Alkaline Phosphatase: 78 U/L (ref 39–117)
Anion gap: 18 — ABNORMAL HIGH (ref 5–15)
BILIRUBIN TOTAL: 0.2 mg/dL — AB (ref 0.3–1.2)
BUN: 11 mg/dL (ref 6–23)
CHLORIDE: 98 meq/L (ref 96–112)
CO2: 21 meq/L (ref 19–32)
Calcium: 9.9 mg/dL (ref 8.4–10.5)
Creatinine, Ser: 0.91 mg/dL (ref 0.50–1.10)
GFR, EST AFRICAN AMERICAN: 88 mL/min — AB (ref >90)
GFR, EST NON AFRICAN AMERICAN: 76 mL/min — AB (ref >90)
GLUCOSE: 108 mg/dL — AB (ref 70–99)
Potassium: 3.6 mEq/L — ABNORMAL LOW (ref 3.7–5.3)
SODIUM: 137 meq/L (ref 137–147)
Total Protein: 8.2 g/dL (ref 6.0–8.3)

## 2013-10-16 LAB — CBC WITH DIFFERENTIAL/PLATELET
BASOS ABS: 0.1 10*3/uL (ref 0.0–0.1)
Basophils Relative: 0 % (ref 0–1)
Eosinophils Absolute: 0.3 10*3/uL (ref 0.0–0.7)
Eosinophils Relative: 2 % (ref 0–5)
HCT: 43 % (ref 36.0–46.0)
Hemoglobin: 15 g/dL (ref 12.0–15.0)
LYMPHS ABS: 3.5 10*3/uL (ref 0.7–4.0)
Lymphocytes Relative: 23 % (ref 12–46)
MCH: 32.5 pg (ref 26.0–34.0)
MCHC: 34.9 g/dL (ref 30.0–36.0)
MCV: 93.1 fL (ref 78.0–100.0)
Monocytes Absolute: 1.3 10*3/uL — ABNORMAL HIGH (ref 0.1–1.0)
Monocytes Relative: 9 % (ref 3–12)
NEUTROS ABS: 9.9 10*3/uL — AB (ref 1.7–7.7)
Neutrophils Relative %: 66 % (ref 43–77)
PLATELETS: 451 10*3/uL — AB (ref 150–400)
RBC: 4.62 MIL/uL (ref 3.87–5.11)
RDW: 13.7 % (ref 11.5–15.5)
WBC: 15.2 10*3/uL — ABNORMAL HIGH (ref 4.0–10.5)

## 2013-10-16 LAB — URINALYSIS, ROUTINE W REFLEX MICROSCOPIC
Bilirubin Urine: NEGATIVE
Glucose, UA: NEGATIVE mg/dL
HGB URINE DIPSTICK: NEGATIVE
KETONES UR: NEGATIVE mg/dL
Leukocytes, UA: NEGATIVE
Nitrite: NEGATIVE
PROTEIN: NEGATIVE mg/dL
Specific Gravity, Urine: 1.009 (ref 1.005–1.030)
Urobilinogen, UA: 0.2 mg/dL (ref 0.0–1.0)
pH: 6.5 (ref 5.0–8.0)

## 2013-10-16 LAB — PROTIME-INR
INR: 2.06 — ABNORMAL HIGH (ref 0.00–1.49)
PROTHROMBIN TIME: 23.2 s — AB (ref 11.6–15.2)

## 2013-10-16 LAB — PREGNANCY, URINE: Preg Test, Ur: NEGATIVE

## 2013-10-16 LAB — RAPID URINE DRUG SCREEN, HOSP PERFORMED
Amphetamines: NOT DETECTED
Barbiturates: NOT DETECTED
Benzodiazepines: POSITIVE — AB
Cocaine: NOT DETECTED
Opiates: POSITIVE — AB
TETRAHYDROCANNABINOL: POSITIVE — AB

## 2013-10-16 LAB — ETHANOL

## 2013-10-16 LAB — SALICYLATE LEVEL: Salicylate Lvl: <2 mg/dL — ABNORMAL LOW (ref 2.8–20.0)

## 2013-10-16 LAB — ACETAMINOPHEN LEVEL: Acetaminophen (Tylenol), Serum: <15 ug/mL (ref 10–30)

## 2013-10-16 MED ORDER — HALOPERIDOL LACTATE 5 MG/ML IJ SOLN
5.0000 mg | Freq: Once | INTRAMUSCULAR | Status: AC
  Administered 2013-10-16: 5 mg via INTRAMUSCULAR
  Filled 2013-10-16: qty 1

## 2013-10-16 MED ORDER — LORAZEPAM 2 MG/ML IJ SOLN
2.0000 mg | Freq: Once | INTRAMUSCULAR | Status: AC
  Administered 2013-10-16: 2 mg via INTRAVENOUS
  Filled 2013-10-16: qty 1
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
## 2013-10-16 NOTE — ED Notes (Signed)
Pt released 2 days ago. Significant other states she may have had low sodium.  Pt yelling and crying in triage.  Pt continues to pray out and states "mouth moving" with no words.  Pt not making sense.
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
## 2013-10-16 NOTE — ED Notes (Signed)
Pt clothes: jeans, flipflops, shirt and underwear all placed in personal belongings bag placed in locker#27
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
## 2013-10-16 NOTE — ED Notes (Signed)
Emergency Contact: Wallie Char (boyfriend) [phone] Password: "[email or web address]"

## 2013-10-17 LAB — PROTIME-INR
INR: 2.73 — ABNORMAL HIGH (ref 0.00–1.49)
Prothrombin Time: 28.9 seconds — ABNORMAL HIGH (ref 11.6–15.2)

## 2013-10-17 MED ORDER — BENZTROPINE MESYLATE 1 MG PO TABS
1.0000 mg | ORAL_TABLET | Freq: Every day | ORAL | Status: DC
  Administered 2013-10-17 – 2013-10-18 (×2): 1 mg via ORAL
  Filled 2013-10-17 (×2): qty 1

## 2013-10-17 MED ORDER — CYCLOBENZAPRINE HCL 10 MG PO TABS
10.0000 mg | ORAL_TABLET | Freq: Three times a day (TID) | ORAL | Status: DC | PRN
  Administered 2013-10-17 – 2013-10-19 (×6): 10 mg via ORAL
  Filled 2013-10-17 (×7): qty 1

## 2013-10-17 MED ORDER — WARFARIN - PHARMACIST DOSING INPATIENT: Freq: Every day | Status: DC

## 2013-10-17 MED ORDER — NICOTINE 21 MG/24HR TD PT24
21.0000 mg | MEDICATED_PATCH | Freq: Every day | TRANSDERMAL | Status: DC
  Administered 2013-10-17 – 2013-10-19 (×3): 21 mg via TRANSDERMAL
  Filled 2013-10-17 (×3): qty 1

## 2013-10-17 MED ORDER — IBUPROFEN 200 MG PO TABS
400.0000 mg | ORAL_TABLET | Freq: Four times a day (QID) | ORAL | Status: DC | PRN
  Administered 2013-10-17 – 2013-10-19 (×6): 400 mg via ORAL
  Filled 2013-10-17 (×6): qty 2

## 2013-10-17 MED ORDER — WARFARIN SODIUM 5 MG PO TABS
5.0000 mg | ORAL_TABLET | Freq: Once | ORAL | Status: AC
  Administered 2013-10-17: 5 mg via ORAL
  Filled 2013-10-17: qty 1

## 2013-10-17 MED ORDER — RISPERIDONE 2 MG PO TABS
3.0000 mg | ORAL_TABLET | Freq: Every day | ORAL | Status: DC
  Administered 2013-10-17 – 2013-10-18 (×2): 3 mg via ORAL
  Filled 2013-10-17 (×4): qty 1

## 2013-10-17 MED ORDER — DIAZEPAM 5 MG PO TABS
5.0000 mg | ORAL_TABLET | Freq: Two times a day (BID) | ORAL | Status: DC
  Administered 2013-10-17 – 2013-10-19 (×5): 5 mg via ORAL
  Filled 2013-10-17 (×5): qty 1

## 2013-10-17 MED ORDER — CLONIDINE HCL 0.1 MG PO TABS
0.1000 mg | ORAL_TABLET | Freq: Three times a day (TID) | ORAL | Status: DC | PRN
  Administered 2013-10-17 – 2013-10-19 (×3): 0.1 mg via ORAL
  Filled 2013-10-17 (×3): qty 1

## 2013-10-17 NOTE — ED Notes (Signed)
TTS called and asked to come reassess pt. Now that she is no longer restrained before she goes over to [facility].

## 2013-10-17 NOTE — ED Notes (Signed)
Report received from [place],  Pt is to be re evaluated prior to being sent to [facility]

## 2013-10-17 NOTE — Progress Notes (Signed)
[name] is currently on coumadin for a diagnosis of history of PE.  Current Labs: Hematology Lab Results  Component Value Date/Time   WBC 15.2* 10/16/2013  7:35 PM   WBC 14.4* 11/28/2010 10:50 AM   RBC 4.62 10/16/2013  7:35 PM   RBC 4.22 11/28/2010 10:50 AM   HGB 15.0 10/16/2013  7:35 PM   HGB 13.5 11/28/2010 10:50 AM   HCT 43.0 10/16/2013  7:35 PM   HCT 39.6 11/28/2010 10:50 AM   MCV 93.1 10/16/2013  7:35 PM   MCV 93.9 11/28/2010 10:50 AM   MCH 32.5 10/16/2013  7:35 PM   MCH 32.0 11/28/2010 10:50 AM   Platelets  Date Value Ref Range Status  10/16/2013 451* 150 - 400 K/uL Final  11/28/2010 403* 145 - 400 10e3/uL Final   No results found for this basename: PTT   INR  Date Value Ref Range Status  10/16/2013 2.06* 0.00 - 1.49 Final  11/28/2010 Sent out for confirmation  2.00 - 3.50 Final     INR is useful only to assess adequacy of anticoagulation with coumadin when comparing results from different labs. It should not be used to estimate bleeding risk or presence/abscense of coagulopathy in patients not on coumadin. Expected INR ranges for      nontherapeutic patients is 0.88 - 1.12.    [name] is stable with regards to anticoagulation.  Warfarin will be started with a target INR of 2-3.  INR=2.73 Will give a dose of 5 mg X 1 today and follow INR.  [name] RPh 10/17/2013, 4:17 PM Pager [phone]
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
## 2013-10-17 NOTE — ED Notes (Signed)
Patient presents tearful and anxious; worried about 2 important books that her husband is supposed to be bringing to the hospital; denies any current thoughts of self harm or thoughts to harm others; denies any current AVH. NAD

## 2013-10-17 NOTE — Progress Notes (Signed)
The following facilities have been contacted regarding bed availability for inptx:  BED AVAILABILITY/WAIT LIST [facility]- per Joycelyn Schmid can fax for review, beds available [facility]- per Pamala Hurry can fax for review, beds available [facility]- per [place] beds available, referral faxed [facility]- per [facility] beds available, referral faxed [facility]- referral faxed [place] [facility]- per [facility] beds available, referral faxed [facility]- per Helene Kelp can fax for wait list, possible d/c's tomorrow  [facility] [place]- per Yehuda Budd- per [facility] Blend; c/a beds only [place]- per [facility]- per [facility] Drummer- per [facility]- per [facility] Disposition MHT

## 2013-10-17 NOTE — BH Assessment (Signed)
[place] Assessment Progress Note  Received a call from [place] and was declined due to acuity per Helene Kelp.  Shaune Pascal, MS, [facility] Licensed Professional Counselor Triage Specialist
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
## 2013-10-17 NOTE — ED Notes (Signed)
Pt transferred from main ed, Pt is IVC.  Pt is delusional, stating her boyfriend does not believe she is psychic, stating her blood has a shine to it and the doctor is making a clone of her.  Denies SI, HI.  Pt calm & cooperative at present, no distress noted.

## 2013-10-17 NOTE — ED Notes (Signed)
No one came to reassess pt for placement. RN spoke with [name] in [place], She will come assess the pt. To see if pt is still appropriate for [facility].

## 2013-10-18 ENCOUNTER — Encounter (HOSPITAL_COMMUNITY): Payer: Self-pay | Admitting: Psychiatry

## 2013-10-18 DIAGNOSIS — F1920 Other psychoactive substance dependence, uncomplicated: Secondary | ICD-10-CM | POA: Diagnosis present

## 2013-10-18 DIAGNOSIS — F259 Schizoaffective disorder, unspecified: Secondary | ICD-10-CM | POA: Diagnosis present

## 2013-10-18 LAB — PROTIME-INR
INR: 2.71 — AB (ref 0.00–1.49)
Prothrombin Time: 28.8 seconds — ABNORMAL HIGH (ref 11.6–15.2)

## 2013-10-18 MED ORDER — WARFARIN SODIUM 5 MG PO TABS
5.0000 mg | ORAL_TABLET | Freq: Once | ORAL | Status: AC
  Administered 2013-10-18: 5 mg via ORAL
  Filled 2013-10-18: qty 1

## 2013-10-18 NOTE — Progress Notes (Signed)
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 Darvocet [Propoxyphene N-Acetaminophen] Nausea And Vomiting and Other (See Comments)    Shaking     Patient Measurements:    Vital Signs: Temp: 98.3 F (36.8 C) (07/19 0525) Temp src: Oral (07/19 0525) BP: 99/78 mmHg (07/19 0525) Pulse Rate: 109 (07/19 0525)  Labs:  Recent Labs  10/16/13 1935 10/17/13 1512 10/18/13 0453  HGB 15.0  --   --   HCT 43.0  --   --   PLT 451*  --   --   LABPROT 23.2* 28.9* 28.8*  INR 2.06* 2.73* 2.71*  CREATININE 0.91  --   --     The CrCl is unknown because both a height and weight (above a minimum accepted value) are required for this calculation.   Medications:  Scheduled:  . benztropine  1 mg Oral QHS  . diazepam  5 mg Oral BID  . nicotine  21 mg Transdermal Daily  . risperiDONE  3 mg Oral QHS  . Warfarin - Pharmacist Dosing Inpatient   Does not apply q1800   Infusions:    Assessment: 43 yo in ER for placement with hx schizophrenia and hx PE on warfarin PTA a reported dose of 5mg  daily except 7.5mg  on Sundays.   INR therapeutic after 5mg  on 7/18  CBC stable   no reported bleeding  Goal of Therapy:  INR 2-3   Plan:  1) Repeat warfarin 5mg  today 2) Daily INR   Adrian Saran, PharmD, BCPS Pager [phone] 10/18/2013 8:11 AM

## 2013-10-18 NOTE — BH Assessment (Signed)
Per Lavell Luster, AC at [facility], [address] is currently at capacity. Contacted the following facilities for placement:  INFORMATION HAS BEEN FAXED, AWAITING RESPONSE: [employer]  BED AVAILABLE, FAXED CLINICAL INFORMATION: [facility]  AT CAPACITY: [facility], per [facility], per [facility], per [facility], per [facility], per [facility], per [facility], per [facility], per [facility], per [facility], per [place], Per [facility], per [place]  PT DECLINED: [place], [facility], [facility] Triage Specialist [phone]

## 2013-10-18 NOTE — ED Notes (Signed)
Pt cooperative at present, denies SI or HI will continue to monitor for safety.
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
## 2013-10-19 ENCOUNTER — Encounter (HOSPITAL_COMMUNITY): Payer: Self-pay | Admitting: Psychiatry

## 2013-10-19 ENCOUNTER — Inpatient Hospital Stay (HOSPITAL_COMMUNITY)
Admission: EM | Admit: 2013-10-19 | Discharge: 2013-10-25 | DRG: 885 | Disposition: A | Discharge status: Home / Self Care | Payer: Medicaid Other | Source: Intra-hospital | Attending: Psychiatry | Admitting: Psychiatry

## 2013-10-19 ENCOUNTER — Encounter (HOSPITAL_COMMUNITY): Payer: Self-pay | Admitting: *Deleted

## 2013-10-19 DIAGNOSIS — Z7901 Long term (current) use of anticoagulants: Secondary | ICD-10-CM | POA: Diagnosis not present

## 2013-10-19 DIAGNOSIS — IMO0002 Reserved for concepts with insufficient information to code with codable children: Secondary | ICD-10-CM | POA: Diagnosis not present

## 2013-10-19 DIAGNOSIS — Z5989 Other problems related to housing and economic circumstances: Secondary | ICD-10-CM | POA: Diagnosis not present

## 2013-10-19 DIAGNOSIS — Z5987 Material hardship due to limited financial resources, not elsewhere classified: Secondary | ICD-10-CM

## 2013-10-19 DIAGNOSIS — Z598 Other problems related to housing and economic circumstances: Secondary | ICD-10-CM | POA: Diagnosis not present

## 2013-10-19 DIAGNOSIS — Z86711 Personal history of pulmonary embolism: Secondary | ICD-10-CM

## 2013-10-19 DIAGNOSIS — F251 Schizoaffective disorder, depressive type: Secondary | ICD-10-CM | POA: Diagnosis present

## 2013-10-19 DIAGNOSIS — F17200 Nicotine dependence, unspecified, uncomplicated: Secondary | ICD-10-CM | POA: Diagnosis present

## 2013-10-19 DIAGNOSIS — F259 Schizoaffective disorder, unspecified: Secondary | ICD-10-CM

## 2013-10-19 DIAGNOSIS — F1220 Cannabis dependence, uncomplicated: Secondary | ICD-10-CM | POA: Diagnosis present

## 2013-10-19 DIAGNOSIS — F4310 Post-traumatic stress disorder, unspecified: Secondary | ICD-10-CM | POA: Diagnosis present

## 2013-10-19 DIAGNOSIS — F200 Paranoid schizophrenia: Secondary | ICD-10-CM | POA: Diagnosis present

## 2013-10-19 DIAGNOSIS — F411 Generalized anxiety disorder: Secondary | ICD-10-CM | POA: Diagnosis present

## 2013-10-19 DIAGNOSIS — F1920 Other psychoactive substance dependence, uncomplicated: Secondary | ICD-10-CM

## 2013-10-19 DIAGNOSIS — Z8542 Personal history of malignant neoplasm of other parts of uterus: Secondary | ICD-10-CM | POA: Diagnosis not present

## 2013-10-19 DIAGNOSIS — F1910 Other psychoactive substance abuse, uncomplicated: Secondary | ICD-10-CM

## 2013-10-19 DIAGNOSIS — G8929 Other chronic pain: Secondary | ICD-10-CM | POA: Diagnosis present

## 2013-10-19 DIAGNOSIS — F911 Conduct disorder, childhood-onset type: Secondary | ICD-10-CM | POA: Diagnosis not present

## 2013-10-19 DIAGNOSIS — F323 Major depressive disorder, single episode, severe with psychotic features: Secondary | ICD-10-CM | POA: Diagnosis present

## 2013-10-19 LAB — PROTIME-INR
INR: 2.3 — AB (ref 0.00–1.49)
PROTHROMBIN TIME: 25.3 s — AB (ref 11.6–15.2)

## 2013-10-19 MED ORDER — NICOTINE 21 MG/24HR TD PT24
21.0000 mg | MEDICATED_PATCH | Freq: Every day | TRANSDERMAL | Status: DC
  Administered 2013-10-20 – 2013-10-25 (×6): 21 mg via TRANSDERMAL
  Filled 2013-10-19 (×2): qty 1
  Filled 2013-10-19 (×2): qty 14
  Filled 2013-10-19 (×5): qty 1

## 2013-10-19 MED ORDER — ALUM & MAG HYDROXIDE-SIMETH 200-200-20 MG/5ML PO SUSP: 30.0000 mL | ORAL | Status: DC | PRN

## 2013-10-19 MED ORDER — RISPERIDONE 3 MG PO TABS
3.0000 mg | ORAL_TABLET | Freq: Every day | ORAL | Status: DC
  Administered 2013-10-19 – 2013-10-21 (×3): 3 mg via ORAL
  Filled 2013-10-19 (×3): qty 1
  Filled 2013-10-19: qty 3
  Filled 2013-10-19: qty 1

## 2013-10-19 MED ORDER — TRAZODONE HCL 50 MG PO TABS
50.0000 mg | ORAL_TABLET | Freq: Every day | ORAL | Status: DC
  Filled 2013-10-19: qty 1

## 2013-10-19 MED ORDER — LORATADINE 10 MG PO TABS
10.0000 mg | ORAL_TABLET | Freq: Every day | ORAL | Status: DC
Start: 2013-10-20 — End: 2013-10-25
  Administered 2013-10-20 – 2013-10-25 (×6): 10 mg via ORAL
  Filled 2013-10-19 (×8): qty 1

## 2013-10-19 MED ORDER — DIAZEPAM 5 MG PO TABS
5.0000 mg | ORAL_TABLET | Freq: Two times a day (BID) | ORAL | Status: DC | PRN
  Administered 2013-10-19 – 2013-10-24 (×7): 5 mg via ORAL
  Filled 2013-10-19 (×7): qty 1

## 2013-10-19 MED ORDER — MAGNESIUM HYDROXIDE 400 MG/5ML PO SUSP: 30.0000 mL | Freq: Every day | ORAL | Status: DC | PRN

## 2013-10-19 MED ORDER — CYCLOBENZAPRINE HCL 10 MG PO TABS
10.0000 mg | ORAL_TABLET | Freq: Three times a day (TID) | ORAL | Status: DC | PRN
  Administered 2013-10-19 – 2013-10-24 (×9): 10 mg via ORAL
  Filled 2013-10-19 (×9): qty 2

## 2013-10-19 MED ORDER — BENZTROPINE MESYLATE 1 MG PO TABS
1.0000 mg | ORAL_TABLET | Freq: Every day | ORAL | Status: DC
  Administered 2013-10-19 – 2013-10-24 (×6): 1 mg via ORAL
  Filled 2013-10-19 (×3): qty 1
  Filled 2013-10-19: qty 3
  Filled 2013-10-19 (×2): qty 1
  Filled 2013-10-19: qty 3
  Filled 2013-10-19 (×3): qty 1

## 2013-10-19 MED ORDER — WARFARIN SODIUM 7.5 MG PO TABS
7.5000 mg | ORAL_TABLET | Freq: Once | ORAL | Status: AC
  Administered 2013-10-19: 7.5 mg via ORAL
  Filled 2013-10-19: qty 1

## 2013-10-19 MED ORDER — TRAZODONE HCL 50 MG PO TABS
50.0000 mg | ORAL_TABLET | Freq: Every evening | ORAL | Status: DC | PRN
  Administered 2013-10-19 – 2013-10-24 (×9): 50 mg via ORAL
  Filled 2013-10-19 (×7): qty 1
  Filled 2013-10-19: qty 6
  Filled 2013-10-19: qty 1
  Filled 2013-10-19: qty 6
  Filled 2013-10-19 (×7): qty 1
  Filled 2013-10-19 (×2): qty 6

## 2013-10-19 NOTE — Progress Notes (Signed)
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 Darvocet [Propoxyphene N-Acetaminophen] Nausea And Vomiting and Other (See Comments)    Shaking    Patient Measurements:   Vital Signs: Temp: 97.2 F (36.2 C) (07/20 0624) Temp src: Oral (07/20 0624) BP: 122/96 mmHg (07/20 0624) Pulse Rate: 86 (07/20 0624)  Labs:  Recent Labs  10/16/13 1935 10/17/13 1512 10/18/13 0453 10/19/13 0630  HGB 15.0  --   --   --   HCT 43.0  --   --   --   PLT 451*  --   --   --   LABPROT 23.2* 28.9* 28.8* 25.3*  INR 2.06* 2.73* 2.71* 2.30*  CREATININE 0.91  --   --   --    Medications:  Scheduled:  . benztropine  1 mg Oral QHS  . diazepam  5 mg Oral BID  . nicotine  21 mg Transdermal Daily  . risperiDONE  3 mg Oral QHS  . traZODone  50 mg Oral QHS  . Warfarin - Pharmacist Dosing Inpatient   Does not apply q1800   Assessment: 43 yo in Psych ED for placement with hx schizophrenia and hx PE on warfarin PTA: reported dose of 5mg  daily except 7.5mg  on Sundays.   INR 2.30 today after 2x 5mg  doses  CBC stable  no reported bleeding  Goal of Therapy:  INR 2-3   Plan:  1) Warfarin 7.5mg  today at 1800 (RN aware to be given before transfer or to be given at next facility today) 2) Daily INR  Minda Ditto PharmD Pager [phone] 10/19/2013, 7:36 AM
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
## 2013-10-19 NOTE — ED Notes (Signed)
Pt. To [facility] with BPD. Alert oriented in no distress. Belongings sent with BPD

## 2013-10-19 NOTE — Consult Note (Signed)
Patient seen, evaluated by me, patient's hypomanic, has a thought disorder and needs inpatient psychiatric care
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
## 2013-10-19 NOTE — ED Notes (Signed)
Pt. States she has had a good day. Pt. Denies SI, HI, AVH. Pt.tates she received Ibuprofen a short while ago but is still experiencing chronic back pain. Pt. States she usually takes Vicodin twice daily and Ibuprofen doesn't work. Pt. Ambulating without difficulty in no obvious distress.

## 2013-10-20 DIAGNOSIS — F200 Paranoid schizophrenia: Principal | ICD-10-CM

## 2013-10-20 DIAGNOSIS — F4310 Post-traumatic stress disorder, unspecified: Secondary | ICD-10-CM

## 2013-10-20 LAB — PROTIME-INR
INR: 2.13 — ABNORMAL HIGH (ref 0.00–1.49)
Prothrombin Time: 23.8 seconds — ABNORMAL HIGH (ref 11.6–15.2)

## 2013-10-20 MED ORDER — WARFARIN - PHARMACIST DOSING INPATIENT
Freq: Every day | Status: DC
  Administered 2013-10-22 – 2013-10-24 (×3)
  Filled 2013-10-20 (×15): qty 1

## 2013-10-20 MED ORDER — WARFARIN SODIUM 5 MG PO TABS
5.0000 mg | ORAL_TABLET | Freq: Once | ORAL | Status: AC
  Administered 2013-10-20: 5 mg via ORAL
  Filled 2013-10-20 (×2): qty 1

## 2013-10-20 NOTE — [facility] Group Notes (Signed)
[place] Group Notes:  orientation  Date:  10/20/2013  Time:  10:07 AM  Type of Therapy:  Nurse Education  Participation Level:  Active  Participation Quality:  Appropriate  Affect:  Appropriate  Cognitive:  Alert  Insight:  Appropriate  Engagement in Group:  Engaged  Modes of Intervention:  Activity  Summary of Progress/Problems:  Delman Kitten 10/20/2013, 10:07 AM

## 2013-10-20 NOTE — Progress Notes (Signed)
Adult Psychoeducational Group Note  Date:  10/20/2013 Time:  11:05 PM  Group Topic/Focus:  Goals Group:   The focus of this group is to help patients establish daily goals to achieve during treatment and discuss how the patient can incorporate goal setting into their daily lives to aide in recovery.  Participation Level:  Active  Participation Quality:  Appropriate  Affect:  Appropriate  Cognitive:  Appropriate  Insight: Appropriate  Engagement in Group:  Engaged  Modes of Intervention:  Discussion  Additional Comments:  Pt stated that waking up the morning was made her happy.  Alexis Goodell R 10/20/2013, 11:05 PM

## 2013-10-20 NOTE — Tx Team (Signed)
  Date: 10/20/2013 9:33 AM  Progress in Treatment:  Attending groups: N/A new admission; to begin programming today Participating in groups: N/A Taking medication as prescribed: Yes  Tolerating medication: Yes  Family/Significant othe contact made: CSW to assess for collateral contact Patient understands diagnosis: Yes Discussing patient identified problems/goals with staff: Yes  Medical problems stabilized or resolved: Yes  Denies suicidal/homicidal ideation: Yes Patient has not harmed self or Others: Yes   New problem(s) identified: Pt was psychotic at admission but, per nursing staff, seemed to become logical and coherent; Pt pacing in the hallways.  Discharge Plan or Barriers: CSW to assess  Additional comments: n/a   Reason for Continuation of Hospitalization:   Depression Medication stabilization Psychotic  Estimated length of stay: 3-5 days  For review of initial/current patient goals, please see plan of care.   Attendees:  Attendees:  Patient:    Family:    Physician: Dr. Parke Poisson, MD  10/20/2013 9:33 AM  Nursing: Lars Pinks, RN Case manager  10/20/2013 9:33 AM  Clinical Social Worker Norman Clay, MSW 10/20/2013 9:33 AM  Other: Verdene Lennert 10/20/2013 9:33 AM  Other:  Marcie Bal, RN 10/20/2013 9:33 AM  Other: , RN Charge Nurse 10/20/2013 9:33 AM  Other: Mateo Flow, [facility] Liaison    Peri Maris, [place] 10/20/2013 10:05 AM

## 2013-10-20 NOTE — Progress Notes (Signed)
Recreation Therapy Notes  Animal-Assisted Activity/Therapy (AAA/T) Program Checklist/Progress Notes Patient Eligibility Criteria Checklist & Daily Group note for Rec Tx Intervention  Date: 07.21.2015 Time: 3:15pm Location: [address]    AAA/T Program Assumption of Risk Form signed by Patient/ or Parent Legal Guardian yes  Patient is free of allergies or sever asthma yes  Patient reports no fear of animals yes  Patient reports no history of cruelty to animals yes   Patient understands his/her participation is voluntary yes  Behavioral Response: Did not attend.   Laureen Ochs Eleonore [place], LRT/CTRS        Lane Hacker 10/20/2013 4:45 PM
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
## 2013-10-20 NOTE — Progress Notes (Signed)
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 Darvocet [Propoxyphene N-Acetaminophen] Nausea And Vomiting and Other (See Comments)    Shaking     Patient Measurements: Height: 5' 4.75" (164.5 cm) Weight: 156 lb (70.761 kg) IBW/kg (Calculated) : 56.43 Heparin Dosing Weight:   Vital Signs: Temp: 98.5 F (36.9 C) (07/21 0755) Temp src: Oral (07/21 0755) BP: 101/67 mmHg (07/21 0756) Pulse Rate: 104 (07/21 0756)  Labs:  Recent Labs  10/17/13 1512 10/18/13 0453 10/19/13 0630  LABPROT 28.9* 28.8* 25.3*  INR 2.73* 2.71* 2.30*    Estimated Creatinine Clearance: 77.5 ml/min (by C-G formula based on Cr of 0.91).   Medications:  Scheduled:  . benztropine  1 mg Oral QHS  . loratadine  10 mg Oral Daily  . nicotine  21 mg Transdermal Daily  . risperiDONE  3 mg Oral QHS  . traZODone  50 mg Oral QHS,MR X 1   Infusions:   PRN: alum & mag hydroxide-simeth, cyclobenzaprine, diazepam, magnesium hydroxide  Assessment: 43 yo in Psych ED for placement with hx schizophrenia and hx PE on warfarin PTA: reported dose of 5mg  daily except 7.5mg  on Sundays.   Ordered INR for today. Goal of Therapy:  INR 2-3    Plan:  1.) Check INR and follow with dose for today. 2.) Daily INR  Gypsy Decant 10/20/2013,9:05 AM

## 2013-10-20 NOTE — [facility] Group Notes (Signed)
[facility] LCSW Group Therapy 10/20/2013 1:15 PM  Type of Therapy: Group Therapy- Feelings about Diagnosis  Participation Level: Active   Participation Quality:  Reserved  Affect:  Flat and Blunted  Cognitive: Alert and Oriented   Insight:  Limited   Engagement in Therapy: Developing/Improving and Engaged   Modes of Intervention: Clarification, Confrontation, Discussion, Education, Exploration, Limit-setting, Orientation, Problem-solving, Rapport Building, Art therapist, Socialization and Support  Description of Group:   This group will allow patients to explore their thoughts and feelings about diagnoses they have received. Patients will be guided to explore their level of understanding and acceptance of these diagnoses. Facilitator will encourage patients to process their thoughts and feelings about the reactions of others to their diagnosis, and will guide patients in identifying ways to discuss their diagnosis with significant others in their lives. This group will be process-oriented, with patients participating in exploration of their own experiences as well as giving and receiving support and challenge from other group members.  Summary of Progress/Problems: The topic for group therapy was feelings about diagnosis. Pt was reserved during most of the group but was able to identify "violent movies" as a trigger for her PTSD.  Pt did not participate in the remainder of group discussion with the exception of trying to relate to another Pt about PTSD symptoms; however, Pt had difficulty articulating her thoughts and following the peer's conversation. Pt seemed disengaged from discussion as she maintained a blank stare or was writing in her journal.  Therapeutic Modalities:   Cognitive Behavioral Therapy Solution Focused Therapy Motivational Interviewing Relapse Prevention Therapy  Peri Maris, LCSWA 10/20/2013 5:09 PM

## 2013-10-20 NOTE — [facility] Suicide Risk Assessment (Signed)
   Nursing information obtained from:  Patient;Review of record Demographic factors:  Divorced or widowed;Caucasian;Low socioeconomic status;Unemployed Current Mental Status:  NA Loss Factors:  Decline in physical health Historical Factors:  NA Risk Reduction Factors:  Living with another person, especially a relative;Positive social support;Positive therapeutic relationship Total Time spent with patient: 45 minutes  CLINICAL FACTORS:   Schizophrenia:   Paranoid or undifferentiated type Chronic Pain  Psychiatric Specialty Exam: Physical Exam  ROS  Blood pressure 101/67, pulse 104, temperature 98.5 F (36.9 C), temperature source Oral, resp. rate 16, height 5' 4.75" (1.645 m), weight 70.761 kg (156 lb), last menstrual period 10/07/2013.Body mass index is 26.15 kg/(m^2).  SEE ADMIT NOTE MSE  COGNITIVE FEATURES THAT CONTRIBUTE TO RISK:  Closed-mindedness    SUICIDE RISK:   Moderate:  Frequent suicidal ideation with limited intensity, and duration, some specificity in terms of plans, no associated intent, good self-control, limited dysphoria/symptomatology, some risk factors present, and identifiable protective factors, including available and accessible social support.  PLAN OF CARE:Patient will be admitted to inpatient psychiatric unit for stabilization and safety. Will provide and encourage milieu participation. Provide medication management and maked adjustments as needed.  Will follow daily.    I certify that inpatient services furnished can reasonably be expected to improve the patient's condition.  COBOS, FERNANDO 10/20/2013, 10:46 AM

## 2013-10-20 NOTE — Progress Notes (Signed)
Patient ID: [name], female   DOB: [birth year], 43 y.o.   MRN: [id number] D: Client restless, walking up and down the [place], reports anxiety, back pain. A: Writer introduced self to client, reviewed and administered medications. Staff will monitor q49min for safety. R: Pt. Is safe on the unit.

## 2013-10-20 NOTE — Progress Notes (Signed)
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 Darvocet [Propoxyphene N-Acetaminophen] Nausea And Vomiting and Other (See Comments)    Shaking     Patient Measurements: Height: 5' 4.75" (164.5 cm) Weight: 156 lb (70.761 kg) IBW/kg (Calculated) : 56.43 Heparin Dosing Weight:   Vital Signs:    Labs:  Recent Labs  10/18/13 0453 10/19/13 0630 10/20/13 2007  LABPROT 28.8* 25.3* 23.8*  INR 2.71* 2.30* 2.13*    Estimated Creatinine Clearance: 77.5 ml/min (by C-G formula based on Cr of 0.91).   Medications:  Scheduled:  . benztropine  1 mg Oral QHS  . loratadine  10 mg Oral Daily  . nicotine  21 mg Transdermal Daily  . risperiDONE  3 mg Oral QHS  . traZODone  50 mg Oral QHS,MR X 1   Infusions:   PRN: alum & mag hydroxide-simeth, cyclobenzaprine, diazepam, magnesium hydroxide  Assessment: 43 yo in Psych ED for placement with hx schizophrenia and hx PE on warfarin PTA: reported dose of 5mg  daily except 7.5mg  on Sundays.   INR therapeutic at 2.13  Patient received 7.5 mg dose yesterday.  Goal of Therapy:  INR 2-3    Plan:  1.) Warfarin 5 mg tonight. 2.) Daily INR  Hershal Coria 10/20/2013,8:44 PM

## 2013-10-20 NOTE — Progress Notes (Signed)
Patient ID: [name], female   DOB: [birth year], 43 y.o.   MRN: [id number] Pt with limited visibility in the milieu.  Interacting appropriately with staff and peers. Pt mostly remaining in her room reading a book.  Pt presenting with flight of ideas.  Needs assessed. Pt denied.  Fifteen minute checks in progress for patient safety.  Pt safe on unit.

## 2013-10-20 NOTE — Progress Notes (Signed)
D: Patient denies SI/HI and A/V hallucinations; patient reports sleep is good and reports that she did request medication and that it helped; reports appetite is good; reports energy level is normal; reports ability to concentrate are good; rates depression as 0/10; rates hopelessness 0/10; rates anxiety as 7/10;   A: Monitored q 15 minutes; patient encouraged to attend groups; patient educated about medications; patient given medications per physician orders; patient encouraged to express feelings and/or concerns  R: Patient is reporting back pain; patient has been walking up and down he hallway; patient has been logical and coherent all this morning and has not voiced any of the statements reported upon admission; patient's interaction with staff and peers is minimal but appropriate; patient was able to set goal to talk with staff 1:1 when having feelings of SI; patient is taking medications as prescribed and tolerating medications

## 2013-10-21 LAB — HEMOGLOBIN A1C
Hgb A1c MFr Bld: 5.7 % — ABNORMAL HIGH (ref <5.7)
MEAN PLASMA GLUCOSE: 117 mg/dL — AB (ref <117)

## 2013-10-21 LAB — PROTIME-INR
INR: 2.14 — ABNORMAL HIGH (ref 0.00–1.49)
Prothrombin Time: 23.9 seconds — ABNORMAL HIGH (ref 11.6–15.2)

## 2013-10-21 MED ORDER — WARFARIN SODIUM 5 MG PO TABS
5.0000 mg | ORAL_TABLET | Freq: Once | ORAL | Status: AC
  Administered 2013-10-21: 5 mg via ORAL
  Filled 2013-10-21: qty 1

## 2013-10-21 NOTE — Progress Notes (Signed)
NUTRITION ASSESSMENT  Pt identified as at risk on the Malnutrition Screen Tool  INTERVENTION: 1. Educated patient on the importance of nutrition and encouraged intake of food and beverages. 2. Discussed weight goals. 3. Supplements: none at this time.  Goal: Pt to meet >/= 90% of their estimated nutrition needs.  Monitor:  PO intake  Assessment:  Patient admitted with schizoaffective disorder.  Patient reports good intake currently and prior to admit.  Weight increased 3 lbs in the past 1 1/2 weeks.  Patient states that she is trying to lose weight.  43 y.o. female  Height: Ht Readings from Last 1 Encounters:  10/19/13 5' 4.75" (1.645 m)    Weight: Wt Readings from Last 1 Encounters:  10/19/13 156 lb (70.761 kg)    Weight Hx: Wt Readings from Last 10 Encounters:  10/19/13 156 lb (70.761 kg)  10/11/13 153 lb 14.1 oz (69.8 kg)  06/02/13 162 lb (73.483 kg)  01/27/13 150 lb (68.04 kg)  01/25/13 153 lb 14.1 oz (69.8 kg)  01/06/13 158 lb (71.668 kg)  12/18/12 158 lb (71.668 kg)  10/10/11 173 lb (78.472 kg)  09/19/11 222 lb (100.699 kg)  09/14/11 189 lb (85.73 kg)    BMI:  Body mass index is 26.15 kg/(m^2). Pt meets criteria for overweight based on current BMI.  Estimated Nutritional Needs: Kcal: 25-30 kcal/kg Protein: > 1 gram protein/kg Fluid: 1 ml/kcal  Diet Order: General Pt is also offered choice of unit snacks mid-morning and mid-afternoon.  Pt is eating as desired.   Lab results and medications reviewed.   Antonieta Iba, RD, LDN Clinical Inpatient Dietitian Pager:  [phone] Weekend and after hours pager:  [phone]

## 2013-10-21 NOTE — Progress Notes (Signed)
Adult Psychoeducational Group Note  Date:  10/21/2013 Time: 10:00am Group Topic/Focus:  Personal Choices and Values:   The focus of this group is to help patients assess and explore the importance of values in their lives, how their values affect their decisions, how they express their values and what opposes their expression.  Participation Level:  Active  Participation Quality:  Appropriate and Attentive  Affect:  Appropriate  Cognitive:  Alert and Appropriate  Insight: Appropriate  Engagement in Group:  Engaged  Modes of Intervention:  Discussion and Education  Additional Comments: Pt attended group and the discussion was on Archivist. The question was asked What does personal development mean to you? Pt stated making it a habit to change the negative into positive.   Marlowe Shores D 10/21/2013, 3:26 PM
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
## 2013-10-21 NOTE — BHH Suicide Risk Assessment (Signed)
[place] INPATIENT:  Family/Significant Other Suicide Prevention Education  Suicide Prevention Education:  Education Completed; [name] - husband [phone]),  (name of family member/significant other) has been identified by the patient as the family member/significant other with whom the patient will be residing, and identified as the person(s) who will aid the patient in the event of a mental health crisis (suicidal ideations/suicide attempt).  With written consent from the patient, the family member/significant other has been provided the following suicide prevention education, prior to the and/or following the discharge of the patient.  The suicide prevention education provided includes the following:  Suicide risk factors  Suicide prevention and interventions  National Suicide Hotline telephone number  [facility] assessment telephone number  [facility] Emergency Assistance [place] and/or Residential Mobile Crisis Unit telephone number  Request made of family/significant other to:  Remove weapons (e.g., guns, rifles, knives), all items previously/currently identified as safety concern.    Remove drugs/medications (over-the-counter, prescriptions, illicit drugs), all items previously/currently identified as a safety concern.  The family member/significant other verbalizes understanding of the suicide prevention education information provided.  The family member/significant other agrees to remove the items of safety concern listed above.  Husband states that pt was hallucinating really bad, talking to people that were not there, thinking 10 people were in the room.  Husband states that pt has been here so many times she knows how to hide her hallucinations to get d/c.  Husband states that if you sit with pt long enough she'll turn her head and talk to others.  Husband states that this episode has been going on for the last few weeks.    [name] 10/21/2013, 12:59 PM

## 2013-10-21 NOTE — Progress Notes (Signed)
D: Pt presents with flat affect, depressed mood.  Pt initially talkative with Probation officer, stating "I love Buddhism.  These are called mantras.  They say they're called mandalas on this sheet.  They're not, they're mantras" while pointing to a drawing she was coloring.  Pt denies SI/HI.  Denies AH/VH. Pt states "I think hurting someone else or myself is the biggest sin."  Pt then started talking about the books she was reading and how she would like her husband to bring more books and a sports bra.   A: Pt received medications as ordered.  Safety maintained.  Participation, encouragement and support provided.   R: Pt tearful later in shift, stating "I found out I can't go somewhere after discharge that I thought I could go."  Pt refused to elaborate, stated she was safe and "I just needed to cry." Received PRN medication for sleep and anxiety.  Medication effective.  Will continue to monitor for safety.

## 2013-10-21 NOTE — BHH Counselor (Signed)
Adult Psychosocial Assessment Update Interdisciplinary Team  Previous [facility] admissions/discharges:  Admissions Discharges  Date: 01/27/13 Date: 02/05/13  Date: 12/22/12 Date: 01/02/13  Date: 10/10/11 Date: 10/18/11  Date: 09/19/11 Date: 09/26/11  Date: Date:   Changes since the last Psychosocial Assessment (including adherence to outpatient mental health and/or substance abuse treatment, situational issues contributing to decompensation and/or relapse). Pt states that she admitted to the hospital due to falling.  Pt states that she blacked out and doesn't remember what happened.  Pt denies any mental health symptoms at this time and says she's here for medical reasons.  Pt's only complaint is anxiety and constantly shows her hands shaking, to indicPt states that she regularly goes to her PCP and [facility] for follow up.  Pt can return to [facility] for outpatient medication management and therapy.              Discharge Plan 1. Will you be returning to the same living situation after discharge?   Yes: X Pt can return home in [place] No:      If no, what is your plan?           2. Would you like a referral for services when you are discharged? Yes:  X    If yes, for what services?  Pt can return to [facility] for outpatient medication management and therapy.    No:              Summary and Recommendations (to be completed by the evaluator) Patient is a 43 year old Caucasian Female with a diagnosis of Scizoaffective Disorder.  Patient lives in [place] with her husband.  Patient will benefit from crisis stabilization, medication evaluation, group therapy and psycho education in addition to case management for discharge planning.                         Signature:  Ane Payment, 10/21/2013 8:31 AM

## 2013-10-21 NOTE — Progress Notes (Signed)
D: Patient denies SI/HI and A/V hallucinations; patient reports sleep is good; reports appetite is good; reports energy level is normal ; reports ability to pay attention is ; rates depression as 0/10; rates hopelessness 0/10; rates anxiety as 0/10;   A: Monitored q 15 minutes; patient encouraged to attend groups; patient educated about medications; patient given medications per physician orders; patient encouraged to express feelings and/or concerns  R: Patient is cooperative; patient is focused on her back pain and keeps reporting water behind her ears and Dr. Parke Poisson is aware; patient sits in her room unless their is a group going on;patient's interaction with staff and peers is very minimal; patient was able to set goal to talk with staff 1:1 when having feelings of SI; patient is taking medications as prescribed and tolerating medications; patient is attending all groups

## 2013-10-21 NOTE — BHH Group Notes (Signed)
Adult Psychoeducational Group Note  Date:  10/21/2013 Time:  9:22 PM  Group Topic/Focus:  Wrap-Up Group:   The focus of this group is to help patients review their daily goal of treatment and discuss progress on daily workbooks.  Participation Level:  Active  Participation Quality:  Appropriate  Affect:  Appropriate  Cognitive:  Alert  Insight: Appropriate  Engagement in Group:  Engaged  Modes of Intervention:  Discussion  Additional Comments:  Pt stated that today was a great day even though her visitor wasn't able to visit today.   Jill Side, [place] Delahoussaye G 10/21/2013, 9:22 PM

## 2013-10-21 NOTE — BHH Group Notes (Signed)
[facility] LCSW Aftercare Discharge Planning Group Note   10/21/2013 8:45 AM  Participation Quality:  Alert, Appropriate and Oriented  Mood/Affect:  Disorganized  Depression Rating:  2  Anxiety Rating:  2  Thoughts of Suicide:  Pt denies SI/HI  Will you contract for safety?   Yes  Current AVH:  Pt denies  Plan for Discharge/Comments:  Pt attended discharge planning group and actively participated in group.  CSW provided pt with today's workbook.  Pt reports feeling well today.  Pt needed much redirection as she wanted to talk about other topics, such as her daughter's addiction.  Pt states that she can return home in [place] and has follow up scheduled with [facility] for outpatient medication management and therapy.  No further needs voiced by pt at this time.    Transportation Means: Pt reports access to transportation - boyfriend will pick pt up  Supports: No supports mentioned at this time  Regan Lemming, LCSW 10/21/2013 9:50 AM

## 2013-10-21 NOTE — Progress Notes (Addendum)
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 Darvocet [Propoxyphene N-Acetaminophen] Nausea And Vomiting and Other (See Comments)    Shaking     Patient Measurements: Height: 5' 4.75" (164.5 cm) Weight: 156 lb (70.761 kg) IBW/kg (Calculated) : 56.43 Heparin Dosing Weight:   Vital Signs: Temp: 98.7 F (37.1 C) (07/22 0700) Temp src: Oral (07/22 0700) BP: 107/68 mmHg (07/22 0701) Pulse Rate: 75 (07/22 0701)  Labs:  Recent Labs  10/19/13 0630 10/20/13 2007 10/21/13 0610  LABPROT 25.3* 23.8* 23.9*  INR 2.30* 2.13* 2.14*    Estimated Creatinine Clearance: 77.5 ml/min (by C-G formula based on Cr of 0.91).   Medications:  Scheduled:  . benztropine  1 mg Oral QHS  . loratadine  10 mg Oral Daily  . nicotine  21 mg Transdermal Daily  . risperiDONE  3 mg Oral QHS  . traZODone  50 mg Oral QHS,MR X 1  . warfarin  5 mg Oral ONCE-1800  . Warfarin - Pharmacist Dosing Inpatient   Does not apply q1800   Infusions:   PRN: alum & mag hydroxide-simeth, cyclobenzaprine, diazepam, magnesium hydroxide  Assessment: 43 yo in  with hx schizophrenia and hx PE on warfarin PTA: reported dose of 5mg  daily except 7.5mg  on Sundays.   Goal of Therapy:  INR 2-3 INR today = 2.14    Plan:  1.)  Warfarin 5mg  po today at 1800. 2.)  Daily INR  Gypsy Decant 10/21/2013,1:26 PM

## 2013-10-22 LAB — PROTIME-INR
INR: 2.2 — ABNORMAL HIGH (ref 0.00–1.49)
PROTHROMBIN TIME: 24.4 s — AB (ref 11.6–15.2)

## 2013-10-22 MED ORDER — RISPERIDONE 2 MG PO TABS
2.0000 mg | ORAL_TABLET | Freq: Two times a day (BID) | ORAL | Status: DC
  Administered 2013-10-22 – 2013-10-25 (×6): 2 mg via ORAL
  Filled 2013-10-22: qty 1
  Filled 2013-10-22: qty 6
  Filled 2013-10-22: qty 1
  Filled 2013-10-22: qty 6
  Filled 2013-10-22 (×3): qty 1
  Filled 2013-10-22: qty 6
  Filled 2013-10-22 (×2): qty 1
  Filled 2013-10-22: qty 6
  Filled 2013-10-22: qty 1

## 2013-10-22 MED ORDER — WARFARIN SODIUM 5 MG PO TABS
5.0000 mg | ORAL_TABLET | Freq: Once | ORAL | Status: AC
  Administered 2013-10-22: 5 mg via ORAL
  Filled 2013-10-22: qty 1

## 2013-10-22 NOTE — BHH Group Notes (Signed)
[place] LCSW Group Therapy 10/22/2013  2:23 PM   Type of Therapy: Group Therapy Participation Level: Active  Participation Quality: Attentive, Sharing and Supportive  Affect: Depressed and Flat  Cognitive: Alert and Oriented  Insight: Developing/Improving and Engaged  Engagement in Therapy: Developing/Improving and Engaged  Modes of Intervention: Clarification, Confrontation, Discussion, Education, Exploration, Limit-setting, Orientation, Problem-solving, Rapport Building, Art therapist, Socialization and Support  Summary of Progress/Problems: The topic for group was balance in life. Today's group focused on defining balance in one's own words, identifying things that can knock one off balance, and exploring healthy ways to maintain balance in life. Group members were asked to provide an example of a time when they felt off balance, describe how they handled that situation,and process healthier ways to regain balance in the future. Group members were asked to share the most important tool for maintaining balance that they learned while at [facility] and how they plan to apply this method after discharge. Patient discussed her concern regarding her daughter's substance use and shared that she plans on engaging in self-care in the form of reading and coloring to relieve stress.  [name], MSW, [employer] Worker [facility] [phone]

## 2013-10-22 NOTE — Progress Notes (Addendum)
Adult Psychoeducational Group Note  Date:  10/22/2013 Time:  10:00am  Group Topic/Focus:  Making Healthy Choices:   The focus of this group is to help patients identify negative/unhealthy choices they were using prior to admission and identify positive/healthier coping strategies to replace them upon discharge.  Participation Level:   Participation Quality:    Affect:      Insight:   Engagement in Group:    Modes of Intervention:    Additional Comments:  Pt did not attend group.  Marlowe Shores D 10/22/2013, 11:06 AM

## 2013-10-22 NOTE — Progress Notes (Signed)
Adult Psychoeducational Group Note  Date:  10/22/2013 Time:  10:54 PM  Group Topic/Focus:  Wrap-Up Group:   The focus of this group is to help patients review their daily goal of treatment and discuss progress on daily workbooks.  Participation Level:  Active  Participation Quality:  Appropriate  Affect:  Appropriate  Cognitive:  Appropriate  Insight: Appropriate  Engagement in Group:  Engaged  Modes of Intervention:  Activity  Additional Comments:  Pt attended [place] group.  Tonia Brooms D 10/22/2013, 10:54 PM

## 2013-10-22 NOTE — Progress Notes (Signed)
D: Patient has depressed affect and mood. She reported on the self inventory sheet that sleep, appetite and ability to concentrate are all good and energy level is normal. Patient rated depression, feelings of hopelessness and anxiety "0". She's participating in groups; Probation officer observed that the patient goes into the dayroom to color pictures, but does not interact with peers. Patient is compliant with all medications.  A: Support and encouragement provided to patient. Scheduled medications administered per MD orders. Maintain Q15 minute checks for safety.  R: Patient receptive. Denies SI/HI and AVH. Patient remains safe.

## 2013-10-22 NOTE — Progress Notes (Signed)
Adult Psychoeducational Group Note  Date:  10/22/2013 Time:  9:00 AM  Group Topic/Focus:  Morning Wellness Group  Participation Level:  Active  Participation Quality:  Appropriate and Attentive  Affect:  Appropriate  Cognitive:  Alert and Oriented  Insight: Good  Engagement in Group:  Engaged  Modes of Intervention:  Discussion  Additional Comments: Patient voiced that she likes to color and read in her leisure time.  Eduard Roux E 10/22/2013, 4:19 PM

## 2013-10-22 NOTE — Progress Notes (Signed)
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 Darvocet [Propoxyphene N-Acetaminophen] Nausea And Vomiting and Other (See Comments)    Shaking     Patient Measurements: Height: 5' 4.75" (164.5 cm) Weight: 156 lb (70.761 kg) IBW/kg (Calculated) : 56.43 Heparin Dosing Weight:   Vital Signs: Temp: 97.4 F (36.3 C) (07/23 0700) Temp src: Oral (07/23 0700) BP: 98/67 mmHg (07/23 0701) Pulse Rate: 111 (07/23 0701)  Labs:  Recent Labs  10/20/13 2007 10/21/13 0610 10/22/13 0614  LABPROT 23.8* 23.9* 24.4*  INR 2.13* 2.14* 2.20*    Estimated Creatinine Clearance: 77.5 ml/min (by C-G formula based on Cr of 0.91).   Medications:  Scheduled:  . benztropine  1 mg Oral QHS  . loratadine  10 mg Oral Daily  . nicotine  21 mg Transdermal Daily  . risperiDONE  3 mg Oral QHS  . traZODone  50 mg Oral QHS,MR X 1  . warfarin  5 mg Oral ONCE-1800  . Warfarin - Pharmacist Dosing Inpatient   Does not apply q1800   Infusions:   PRN: alum & mag hydroxide-simeth, cyclobenzaprine, diazepam, magnesium hydroxide  Assessment: 43 yo in [facility] for placement with hx schizophrenia and hx PE on warfarin PTA: reported dose of 5mg  daily except 7.5mg  on Sundays.   INR therapeutic at 2.20  Patient received 5 mg 7/22  Goal of Therapy:  INR 2-3    Plan:  1.) Warfarin 5 mg tonight. 2.) Daily INR  Lawana Pai R 10/22/2013,10:08 AM

## 2013-10-22 NOTE — Progress Notes (Signed)
Patient ID: [name], female   DOB: May 28, 1970, 43 y.o.   MRN: [id number] [facility] MD Progress Note  10/22/2013 1:14 PM [name]  MRN:  [id number] Subjective:  At this time patient is not endorsing any acute issues. Objective :  Patient gradually improving. She is less thought disordered, although she continues to be tangential .  She states her mood is "OK", and she denies depression or sadness, but nursing notes indicate that she presents with some sadness and subdued affect. Her insight is limited and she states the only psychiatric condition she has is PTSD and she is here because of " how the coumadin affects me". However, we reviewed symptoms noted in H and P, indicating severe symptoms prior to her admission and she does not deny these, and states " I guess it doesn't sound too good, does it" Regarding weight loss, which had been significant over recent months , she states she had been working on weight loss through diet and exercise. No disruptive behaviors on unit. Some group participation. Denies medication side effects.  Diagnosis:  Paranoid Schizophrenia by history, PTSD by history   Total Time spent with patient: 20 minutes    ADL's:  Fair   Sleep: Sleep is fair   Appetite:  "OK"  Suicidal Ideation:  Denies  Homicidal Ideation:  Denies  AEB (as evidenced by):  Psychiatric Specialty Exam: Physical Exam  Review of Systems  Constitutional: Negative for fever and chills.  HENT: Positive for congestion and ear pain.        Reports she has been sneezing  Respiratory: Negative for cough and shortness of breath.   Cardiovascular: Negative for chest pain.  Skin: Negative for rash.  Psychiatric/Behavioral: Negative.        Psychotic symptoms    Blood pressure 98/67, pulse 111, temperature 97.4 F (36.3 C), temperature source Oral, resp. rate 17, height 5' 4.75" (1.645 m), weight 70.761 kg (156 lb), last menstrual period 10/07/2013.Body mass index is 26.15 kg/(m^2).   General Appearance: Fairly Groomed  Engineer, water::  Good  Speech:  Normal Rate  Volume:  Normal  Mood:  At this time denies depression  Affect:  Constricted, but does smile briefly at times   Thought Process: still tangential at times   Orientation:  NA fully alert and attentive  Thought Content:  Today denies any hallucinations , and does not exhibit any delusional themes/ ideations at this time.  Suicidal Thoughts:  No- denies any suicidal or homicidal ideations, contracts for safety  Homicidal Thoughts:  No  Memory:  NA  Judgement:  Fair  Insight:  Lacking  Psychomotor Activity:  Normal  Concentration:  Good  Recall:  Good  Fund of Knowledge:Good  Language: Good  Akathisia:  Negative  Handed:  Right  AIMS (if indicated):     Assets:  Communication Skills Desire for Improvement Resilience  Sleep:  Number of Hours: 4.25   Musculoskeletal: Strength & Muscle Tone: within normal limits Gait & Station: normal Patient leans: N/A  Current Medications: Current Facility-Administered Medications  Medication Dose Route Frequency Provider Last Rate Last Dose  . alum & mag hydroxide-simeth (MAALOX/MYLANTA) 200-200-20 MG/5ML suspension 30 mL  30 mL Oral Q4H PRN Laverle Hobby, PA-C      . benztropine (COGENTIN) tablet 1 mg  1 mg Oral QHS Laverle Hobby, PA-C   1 mg at 10/21/13 2152  . cyclobenzaprine (FLEXERIL) tablet 10 mg  10 mg Oral TID PRN Laverle Hobby, PA-C   10  mg at 10/21/13 2052  . diazepam (VALIUM) tablet 5 mg  5 mg Oral Q12H PRN Laverle Hobby, PA-C   5 mg at 10/21/13 2153  . loratadine (CLARITIN) tablet 10 mg  10 mg Oral Daily Laverle Hobby, PA-C   10 mg at 10/22/13 0748  . magnesium hydroxide (MILK OF MAGNESIA) suspension 30 mL  30 mL Oral Daily PRN Laverle Hobby, PA-C      . nicotine (NICODERM CQ - dosed in mg/24 hours) patch 21 mg  21 mg Transdermal Daily Laverle Hobby, PA-C   21 mg at 10/22/13 0748  . risperiDONE (RISPERDAL) tablet 2 mg  2 mg Oral BID  Neita Garnet, MD      . traZODone (DESYREL) tablet 50 mg  50 mg Oral QHS,MR X 1 Laverle Hobby, PA-C   50 mg at 10/21/13 2152  . warfarin (COUMADIN) tablet 5 mg  5 mg Oral ONCE-1800 Dorrene German, [facility]      . Warfarin - Pharmacist Dosing Inpatient   Does not apply q1800 Dara Hoyer, [facility]        Lab Results:  Results for orders placed during the hospital encounter of 10/19/13 (from the past 48 hour(s))  PROTIME-INR     Status: Abnormal   Collection Time    10/20/13  8:07 PM      Result Value Ref Range   Prothrombin Time 23.8 (*) 11.6 - 15.2 seconds   INR 2.13 (*) 0.00 - 1.49   Comment: Performed at [place]     Status: Abnormal   Collection Time    10/21/13  6:10 AM      Result Value Ref Range   Prothrombin Time 23.9 (*) 11.6 - 15.2 seconds   INR 2.14 (*) 0.00 - 1.49   Comment: Performed at [place] [facility]     Status: Abnormal   Collection Time    10/21/13  6:10 AM      Result Value Ref Range   Hemoglobin A1C 5.7 (*) <5.7 %   Comment: (NOTE)                                                                               According to the ADA Clinical Practice Recommendations for 2011, when     HbA1c is used as a screening test:      >=6.5%   Diagnostic of Diabetes Mellitus               (if abnormal result is confirmed)     5.7-6.4%   Increased risk of developing Diabetes Mellitus     References:Diagnosis and Classification of Diabetes Mellitus,Diabetes     NLZJ,6734,19(FXTKW 1):S62-S69 and Standards of Medical Care in             Diabetes - 2011,Diabetes Care,2011,34 (Suppl 1):S11-S61.   Mean Plasma Glucose 117 (*) <117 mg/dL   Comment: Performed at [place]     Status: Abnormal   Collection Time    10/22/13  6:14 AM      Result Value Ref Range   Prothrombin Time 24.4 (*) 11.6 - 15.2 seconds  INR 2.20 (*) 0.00 - 1.49   Comment: Performed at [facility]     Physical Findings: AIMS: Facial and Oral Movements Muscles of Facial Expression: None, normal Lips and Perioral Area: None, normal Jaw: None, normal Tongue: None, normal,Extremity Movements Upper (arms, wrists, hands, fingers): None, normal Lower (legs, knees, ankles, toes): None, normal, Trunk Movements Neck, shoulders, hips: None, normal, Overall Severity Severity of abnormal movements (highest score from questions above): None, normal Incapacitation due to abnormal movements: None, normal Patient's awareness of abnormal movements (rate only patient's report): No Awareness, Dental Status Current problems with teeth and/or dentures?: No Does patient usually wear dentures?: Yes (uppers and lower)  CIWA:    COWS:     Assessment:  Patient's behavior calm, no disruptive behaviors on unit. Today not expressing delusional thoughts, but does remain tangential in thought process and her insight is limited. She attributes symptoms she was presenting with prior to admission to coumadin, which she is on for a history of PE Thus far tolerating medications well.   Treatment Plan Summary: Daily contact with patient to assess and evaluate symptoms and progress in treatment Medication management See below  Plan: Continue inpatient treatment. Increase Risperidone  To 2 mgrs BID Continue Claritin 10 mgrs QDAY Continue Valium 5 mgrs BID PRN Anxiety On Coumadin as per  Pharmacy  Medical Decision Making Problem Points:  Established problem, stable/improving (1) Data Points:  Review of medication regiment & side effects (2) Review of new medications or change in dosage (2)  I certify that inpatient services furnished can reasonably be expected to improve the patient's condition.   COBOS, FERNANDO 10/22/2013, 1:14 PM

## 2013-10-22 NOTE — Progress Notes (Signed)
D: Pt denies SI/HI/AV. Pt is pleasant and cooperative. Pt goal for today is to work on her communication with her mother.  A: Pt was offered support and encouragement. Pt was given scheduled medications. Pt was encourage to attend groups. Q 15 minute checks were done for safety.   R:Pt attends groups and interacts well with peers and staff. Pt is taking medication. Pt receptive to treatment and safety maintained on unit.

## 2013-10-22 NOTE — Plan of Care (Signed)
Problem: Ineffective individual coping Goal: STG: Pt will be able to identify effective and ineffective STG: Pt will be able to identify effective and ineffective coping patterns  Outcome: Progressing Pt reported coloring and reading as positive coping skills.  Goal: STG: Patient will remain free from self harm Outcome: Progressing No self harm this shift.    Problem: Alteration in thought process Goal: LTG-Patient has not harmed self or others in at least 2 days Outcome: Progressing Pt did not harm self or others this shift.

## 2013-10-23 LAB — PROTIME-INR
INR: 1.92 — ABNORMAL HIGH (ref 0.00–1.49)
Prothrombin Time: 22 seconds — ABNORMAL HIGH (ref 11.6–15.2)

## 2013-10-23 MED ORDER — WARFARIN SODIUM 6 MG PO TABS
6.0000 mg | ORAL_TABLET | Freq: Once | ORAL | Status: AC
  Administered 2013-10-23: 6 mg via ORAL
  Filled 2013-10-23: qty 1

## 2013-10-23 NOTE — Progress Notes (Signed)
Adult Psychoeducational Group Note  Date:  10/23/2013 Time:  10:02 PM  Group Topic/Focus:  Wrap-Up Group:   The focus of this group is to help patients review their daily goal of treatment and discuss progress on daily workbooks.  Participation Level:  Active  Participation Quality:  Appropriate, Attentive and Sharing  Affect:  Appropriate  Cognitive:  Alert, Appropriate and Oriented  Insight: Appropriate and Good  Engagement in Group:  Engaged and Supportive  Modes of Intervention:  Discussion, Education, Socialization and Support  Additional Comments:  Pt attended and participated in group.  Pt shared that she feels ready to go home and then shared her reason for being in the hospital.   Milus Glazier 10/23/2013, 10:02 PM

## 2013-10-23 NOTE — Progress Notes (Signed)
D: Pt denies SI/HI/AVH. Pt is pleasant and cooperative. Pt has brighter affect and appears happier.   A: Pt was offered support and encouragement. Pt was given scheduled medications. Pt was encourage to attend groups. Q 15 minute checks were done for safety.   R:Pt attends groups and interacts well with peers and staff. Pt is taking medication. Pt has no complaints at this time.Pt receptive to treatment and safety maintained on unit.
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
## 2013-10-23 NOTE — Progress Notes (Signed)
Patient ID: [name], female   DOB: [birth year], 43 y.o.   MRN: [id number] CSW discussed with Pt the possibility of receiving ACTT services through [facility], but Pt declined these services because she "doesn't want them in my house."  Pt reports that she has had ACTT services in the past but does not want them currently.  Peri Maris, [place] 10/23/2013 3:26 PM

## 2013-10-23 NOTE — Progress Notes (Signed)
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 Darvocet [Propoxyphene N-Acetaminophen] Nausea And Vomiting and Other (See Comments)    Shaking     Patient Measurements: Height: 5' 4.75" (164.5 cm) Weight: 156 lb (70.761 kg) IBW/kg (Calculated) : 56.43 Heparin Dosing Weight:   Vital Signs: Temp: 98.6 F (37 C) (07/24 0610) Temp src: Oral (07/24 0610) BP: 115/69 mmHg (07/24 0611) Pulse Rate: 124 (07/24 0611)  Labs:  Recent Labs  10/21/13 0610 10/22/13 0614 10/23/13 0620  LABPROT 23.9* 24.4* 22.0*  INR 2.14* 2.20* 1.92*    Estimated Creatinine Clearance: 77.5 ml/min (by C-G formula based on Cr of 0.91).   Medications:  Scheduled:  . benztropine  1 mg Oral QHS  . loratadine  10 mg Oral Daily  . nicotine  21 mg Transdermal Daily  . risperiDONE  2 mg Oral BID  . traZODone  50 mg Oral QHS,MR X 1  . warfarin  6 mg Oral ONCE-1800  . Warfarin - Pharmacist Dosing Inpatient   Does not apply q1800   Infusions:   PRN: alum & mag hydroxide-simeth, cyclobenzaprine, diazepam, magnesium hydroxide  Assessment: 43 [facility] in [facility] for [facility] with hx schizophrenia and hx PE on warfarin PTA: reported dose of 5mg  daily except 7.5mg  on Sundays  INR just subtherapeutic = 1.92   Patient received 5mg  on 7/23  Goal of Therapy:  INR 2-3    Plan:  1.)  Warfarin 6mg  tonight 2.)  Daily INR  Gypsy Decant 10/23/2013,2:34 PM

## 2013-10-23 NOTE — BHH Group Notes (Signed)
[facility] LCSW Aftercare Discharge Planning Group Note  10/23/2013 8:45 AM  Participation Quality: Alert, Appropriate and Oriented  Mood/Affect: "good" mood; appropriate affect  Depression Rating: 0  Anxiety Rating: 0  Thoughts of Suicide: Pt denies SI/HI  Will you contract for safety? Yes  Current AVH: Pt denies  Plan for Discharge/Comments: Pt attended discharge planning group and actively participated in group. CSW provided pt with today's workbook. Pt's goal for today is to be discharged.  Once discharged, Pt will return home and follow-up with [facility].  Transportation Means: Pt reports access to transportation  Supports: No supports mentioned at this time  Peri Maris, [place] 10/23/2013 1:04 PM

## 2013-10-23 NOTE — BHH Group Notes (Signed)
[facility] LCSW Group Therapy 10/23/2013 1:15pm  Type of Therapy: Group Therapy- Feelings Around Relapse and Recovery  Participation Level: Minimal  Participation Quality:  Reserved  Affect:  Depressed and Flat   Cognitive: Alert and Oriented   Insight:  Lacking   Engagement in Therapy: Developing/Improving and Engaged   Modes of Intervention: Clarification, Confrontation, Discussion, Education, Exploration, Limit-setting, Orientation, Problem-solving, Rapport Building, Art therapist, Socialization and Support  Summary of Progress/Problems: The topic for today was feelings about relapse. Pt participated minimally in group discussion. Pt appeared to be responding to internal stimuli, mouthing words while staring at the floor.  Pt reported that one thing she learned from this relapse was that she needs to get a pill organizer to make sure she takes her medication correctly.    Therapeutic Modalities:   Cognitive Behavioral Therapy Solution-Focused Therapy Assertiveness Training Relapse Prevention Therapy  Peri Maris, LCSWA 10/23/2013 4:12 PM

## 2013-10-23 NOTE — Progress Notes (Signed)
D) Pt has attended the groups and interacts with her peers appropriately. Pt denies SI and HI. States that she wants to go home today and was upset to know that she would not be leaving today. Affect is flat and mood depressed. Pt rates her depression, hopelessness and anxiety all at a 0.  A) Given support and reassurance. Encouraged to continue to speak with the doctor over the weekend. Provided with a 1:1. R) Denies SI and HI.

## 2013-10-23 NOTE — Progress Notes (Addendum)
Patient ID: [name], female   DOB: 1971/02/12, 43 y.o.   MRN: [id number] [facility] MD Progress Note  10/23/2013 5:25 PM [name]  MRN:  [id number] Subjective:  Patient states she is hoping for discharge soon. Objective :  I have discussed case with treatment team. Patient states she is feeling better and states that she thinks she should be ready for discharge, and is hoping to be discharged " over the weekend if possible". She denies any psychotic symptoms and during this session she is calm, pleasant, and not appearing paranoid or bizarre or internally preoccupied. Chart notes indicate that at times patient seems to be talking to self and possibly responding to internal stimuli, which she , as noted, denies.  Her insight remains poor, and continues to feel that her decompensation is related to her coumadin treatment, although today does state" I know that when I get sick I get really bad, and then, like this time, I don't even remember it". She denies side effects, and it does appear Risperidone is helping.  No disruptive behaviors on the unit. Tends to keep to self. Denies medication side effects. With patient's express consent and in her presence I spoke with her husband [name],for collateral information. He states that she seems to be doing better and stabilizing.   Diagnosis:  Paranoid Schizophrenia by history, PTSD by history   Total Time spent with patient: 20 minutes    ADL's:  Fair   Sleep: Sleep is fair   Appetite:  "OK"  Suicidal Ideation:  Denies  Homicidal Ideation:  Denies  AEB (as evidenced by):  Psychiatric Specialty Exam: Physical Exam  Review of Systems  Constitutional: Negative for fever and chills.  HENT: Positive for congestion and ear pain.        Reports she has been sneezing  Respiratory: Negative for cough and shortness of breath.   Cardiovascular: Negative for chest pain.  Skin: Negative for rash.  Psychiatric/Behavioral: Negative.        Psychotic  symptoms    Blood pressure 115/69, pulse 124, temperature 98.6 F (37 C), temperature source Oral, resp. rate 18, height 5' 4.75" (1.645 m), weight 70.761 kg (156 lb), last menstrual period 10/07/2013.Body mass index is 26.15 kg/(m^2).  General Appearance: Fairly Groomed  Engineer, water::  Good  Speech:  Normal Rate  Volume:  Normal  Mood:  At this time denies depression  Affect:  Constricted  Thought Process:  Better organized, more linear  Orientation:  NA fully alert and attentive  Thought Content:  Today denies any hallucinations, no delusions expressed. As noted, staff has noted occasions where she speaks to self, but patient denies any hallucinations  Suicidal Thoughts:  No- denies any suicidal or homicidal ideations, contracts for safety  Homicidal Thoughts:  No  Memory:  NA  Judgement:  Fair  Insight:  Lacking  Psychomotor Activity:  Normal  Concentration:  Good  Recall:  Good  Fund of Knowledge:Good  Language: Good  Akathisia:  Negative  Handed:  Right  AIMS (if indicated):     Assets:  Communication Skills Desire for Improvement Resilience  Sleep:  Number of Hours: 4.25   Musculoskeletal: Strength & Muscle Tone: within normal limits Gait & Station: normal Patient leans: N/A  Current Medications: Current Facility-Administered Medications  Medication Dose Route Frequency Provider Last Rate Last Dose  . alum & mag hydroxide-simeth (MAALOX/MYLANTA) 200-200-20 MG/5ML suspension 30 mL  30 mL Oral Q4H PRN Laverle Hobby, PA-C      .  benztropine (COGENTIN) tablet 1 mg  1 mg Oral QHS Laverle Hobby, PA-C   1 mg at 10/22/13 2205  . cyclobenzaprine (FLEXERIL) tablet 10 mg  10 mg Oral TID PRN Laverle Hobby, PA-C   10 mg at 10/22/13 2205  . diazepam (VALIUM) tablet 5 mg  5 mg Oral Q12H PRN Laverle Hobby, PA-C   5 mg at 10/22/13 2205  . loratadine (CLARITIN) tablet 10 mg  10 mg Oral Daily Laverle Hobby, PA-C   10 mg at 10/23/13 4098  . magnesium hydroxide (MILK OF  MAGNESIA) suspension 30 mL  30 mL Oral Daily PRN Laverle Hobby, PA-C      . nicotine (NICODERM CQ - dosed in mg/24 hours) patch 21 mg  21 mg Transdermal Daily Laverle Hobby, PA-C   21 mg at 10/23/13 0809  . risperiDONE (RISPERDAL) tablet 2 mg  2 mg Oral BID Neita Garnet, MD   2 mg at 10/23/13 0806  . traZODone (DESYREL) tablet 50 mg  50 mg Oral QHS,MR X 1 Laverle Hobby, PA-C   50 mg at 10/22/13 2305  . warfarin (COUMADIN) tablet 6 mg  6 mg Oral ONCE-1800 Gypsy Decant, [place]      . Warfarin - Pharmacist Dosing Inpatient   Does not apply q1800 Dara Hoyer, [facility]        Lab Results:  Results for orders placed during the hospital encounter of 10/19/13 (from the past 48 hour(s))  PROTIME-INR     Status: Abnormal   Collection Time    10/22/13  6:14 AM      Result Value Ref Range   Prothrombin Time 24.4 (*) 11.6 - 15.2 seconds   INR 2.20 (*) 0.00 - 1.49   Comment: Performed at [place]     Status: Abnormal   Collection Time    10/23/13  6:20 AM      Result Value Ref Range   Prothrombin Time 22.0 (*) 11.6 - 15.2 seconds   INR 1.92 (*) 0.00 - 1.49   Comment: Performed at [facility]    Physical Findings: AIMS: Facial and Oral Movements Muscles of Facial Expression: None, normal Lips and Perioral Area: None, normal Jaw: None, normal Tongue: None, normal,Extremity Movements Upper (arms, wrists, hands, fingers): None, normal Lower (legs, knees, ankles, toes): None, normal, Trunk Movements Neck, shoulders, hips: None, normal, Overall Severity Severity of abnormal movements (highest score from questions above): None, normal Incapacitation due to abnormal movements: None, normal Patient's awareness of abnormal movements (rate only patient's report): No Awareness, Dental Status Current problems with teeth and/or dentures?: No Does patient usually wear dentures?: No  CIWA:    COWS:     Assessment:  Pleasant,  cooperative, but somewhat isolative, keeping to self.  Tolerating Risperidone well. Denies suicidal ideations. Denies psychotic symptoms, but has been noted to appear internally preoccupied at times.    Treatment Plan Summary: Daily contact with patient to assess and evaluate symptoms and progress in treatment Medication management See below  Plan: Continue inpatient treatment. Continue Risperidone   2 mgrs BID Continue Claritin 10 mgrs QDAY Continue Valium 5 mgrs BID PRN Anxiety On Coumadin as per  Pharmacy  Medical Decision Making Problem Points:  Established problem, stable/improving (1) Data Points:  Review of medication regiment & side effects (2)  I certify that inpatient services furnished can reasonably be expected to improve the patient's condition.   [name] 10/23/2013, 5:25 PM

## 2013-10-24 DIAGNOSIS — F259 Schizoaffective disorder, unspecified: Secondary | ICD-10-CM

## 2013-10-24 LAB — PROTIME-INR
INR: 1.89 — AB (ref 0.00–1.49)
PROTHROMBIN TIME: 21.7 s — AB (ref 11.6–15.2)

## 2013-10-24 MED ORDER — WARFARIN SODIUM 7.5 MG PO TABS
7.5000 mg | ORAL_TABLET | Freq: Once | ORAL | Status: AC
  Administered 2013-10-24: 7.5 mg via ORAL
  Filled 2013-10-24: qty 3
  Filled 2013-10-24: qty 1
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
## 2013-10-24 NOTE — [facility] Group Notes (Signed)
[facility] Group Notes:  (Clinical Social Work)  10/24/2013   1:15-2:15PM  Summary of Progress/Problems:   The main focus of today's process group was for the patient to identify ways in which they have sabotaged their own mental health wellness/recovery.  Motivational interviewing and a handout were used to explore the benefits and costs of their self-sabotaging behavior as well as the benefits and costs of changing this behavior.  The Stages of Change were explained to the group using a handout, and patients identified where they are with regard to changing self-defeating behaviors.  The patient expressed she self-sabotages with living in fear, especially with regards to her daughter who is currently using drugs.  If she could change this, she believes she would start to have more acceptance in her life, and stop having panic attacks.  Type of Therapy:  Process Group  Participation Level:  Active  Participation Quality:  Attentive  Affect:  Blunted and Depressed  Cognitive:  Oriented  Insight:  Improving  Engagement in Therapy:  Engaged  Modes of Intervention:  Education, Motivational Interviewing   Selmer Dominion, LCSW 10/24/2013, 4:00pm

## 2013-10-24 NOTE — Progress Notes (Signed)
Psychoeducational Group Note  Date: 10/24/2013 Time:  1015  Group Topic/Focus:  Identifying Needs:   The focus of this group is to help patients identify their personal needs that have been historically problematic and identify healthy behaviors to address their needs.  Participation Level:  Active  Participation Quality:  Appropriate  Affect:  Appropriate  Cognitive:  Oriented  Insight:  Improving  Engagement in Group:  Engaged  Additional Comments:  Pt attended the group. Quiet at first and then was able to open up and participate.   Paulino Rily

## 2013-10-24 NOTE — Progress Notes (Signed)
D) Pt has attended the groups and interacts with her peers. Affect and mood are appropriate. Denies SI and HI. States she has been very disappointed because she wants to leave and go home. Has shared in the groups with her peers and has a pleasant attitude. Pt rates her depression, hopelessness and anxiety all at a 0 and denies SI and HI A) Given support, reassurance and praise. Encouraged to speak with her doctor. Provided with a 1:1 R) Denies SI and HI.

## 2013-10-24 NOTE — Progress Notes (Signed)
ANTICOAGULATION CONSULT NOTE - Follow Up Consult  Pharmacy Consult for Coumadin   Allergies  Allergen Reactions  . Darvocet [Propoxyphene N-Acetaminophen] Nausea And Vomiting and Other (See Comments)    Shaking      Recent Labs  10/22/13 0614 10/23/13 0620 10/24/13 0642  LABPROT 24.4* 22.0* 21.7*  INR 2.20* 1.92* 1.89*    Estimated Creatinine Clearance: 77.5 ml/min (by C-G formula based on Cr of 0.91).    Assessment: INR 1.89 today falling below goal  Goal of Therapy:  INR 2-3 Plan:  Coumadin 7.5mg  x1 today PT/INR in AM  Lenox Ponds 10/24/2013,6:07 PM

## 2013-10-24 NOTE — Progress Notes (Signed)
.  Psychoeducational Group Note    Date: 10/24/2013 Time:  0930    Goal Setting Purpose of Group: To be able to set a goal that is measurable and that can be accomplished in one day  Participation Level:  Active  Participation Quality:  Appropriate  Affect:  Flat  Cognitive:  Oriented  Insight:  Improving  Engagement in Group:  Engaged  Additional Comments:  Attended the group and participated in the discussion.  Paulino Rily

## 2013-10-24 NOTE — Progress Notes (Signed)
Patient ID: [name], female   DOB: March 08, 1971, 43 y.o.   MRN: [id number] Patient ID: [name], female   DOB: Oct 14, 1970, 43 y.o.   MRN: [id number] [facility] MD Progress Note  10/24/2013 4:33 PM [name]  MRN:  [id number] Subjective: Patient states she is feeling really good and is hoping for discharge tomorrow. She states she has been to every group and is taking her medication as she is supposed to do so. She is concerned about her daughter who is abusing drugs. [name] thinks that she will do better if she is hospitalized and anticipates trying to help her daughter when she gets home.  Objective : She is alert and oriented and is in a hurry to tell this provider how well she is doing. She actually looks much better than in the past. She is denying SI/HI or AVH. Focused on getting home and taking care of her daughter.    Diagnosis:  Paranoid Schizophrenia by history, PTSD by history   Total Time spent with patient: 20 minutes    ADL's:  Fair   Sleep: Sleep is fair   Appetite:  "OK"  Suicidal Ideation:  Denies  Homicidal Ideation:  Denies  AEB (as evidenced by):  Psychiatric Specialty Exam: Physical Exam  Review of Systems  Constitutional: Negative for fever and chills.  HENT: Positive for congestion and ear pain.        Reports she has been sneezing  Respiratory: Negative for cough and shortness of breath.   Cardiovascular: Negative for chest pain.  Skin: Negative for rash.  Psychiatric/Behavioral: Negative.        Psychotic symptoms    Blood pressure 115/69, pulse 124, temperature 98.6 F (37 C), temperature source Oral, resp. rate 18, height 5' 4.75" (1.645 m), weight 70.761 kg (156 lb), last menstrual period 10/07/2013.Body mass index is 26.15 kg/(m^2).  General Appearance: Fairly Groomed  Engineer, water::  Good  Speech:  Normal Rate  Volume:  Normal  Mood:  At this time denies depression  Affect:  Constricted  Thought Process:  Goal directed, coherent.  Orientation:  NA  fully alert and attentive  Thought Content:  Today denies any hallucinations, no delusions expressed. As noted, staff has noted occasions where she speaks to self, but patient denies any hallucinations  Suicidal Thoughts:  No- denies any suicidal or homicidal ideations, contracts for safety  Homicidal Thoughts:  No  Memory:  NA  Judgement:  Fair  Insight:  Lacking  Psychomotor Activity:  Normal  Concentration:  Good  Recall:  Good  Fund of Knowledge:Good  Language: Good  Akathisia:  Negative  Handed:  Right  AIMS (if indicated):     Assets:  Communication Skills Desire for Improvement Resilience  Sleep:  Number of Hours: 6.5   Musculoskeletal: Strength & Muscle Tone: within normal limits Gait & Station: normal Patient leans: N/A  Current Medications: Current Facility-Administered Medications  Medication Dose Route Frequency Provider Last Rate Last Dose  . alum & mag hydroxide-simeth (MAALOX/MYLANTA) 200-200-20 MG/5ML suspension 30 mL  30 mL Oral Q4H PRN Laverle Hobby, PA-C      . benztropine (COGENTIN) tablet 1 mg  1 mg Oral QHS Laverle Hobby, PA-C   1 mg at 10/23/13 2120  . cyclobenzaprine (FLEXERIL) tablet 10 mg  10 mg Oral TID PRN Laverle Hobby, PA-C   10 mg at 10/23/13 2119  . diazepam (VALIUM) tablet 5 mg  5 mg Oral Q12H PRN Laverle Hobby, PA-C   5  mg at 10/23/13 2119  . loratadine (CLARITIN) tablet 10 mg  10 mg Oral Daily Laverle Hobby, PA-C   10 mg at 10/24/13 4854  . magnesium hydroxide (MILK OF MAGNESIA) suspension 30 mL  30 mL Oral Daily PRN Laverle Hobby, PA-C      . nicotine (NICODERM CQ - dosed in mg/24 hours) patch 21 mg  21 mg Transdermal Daily Laverle Hobby, PA-C   21 mg at 10/24/13 0839  . risperiDONE (RISPERDAL) tablet 2 mg  2 mg Oral BID Neita Garnet, MD   2 mg at 10/24/13 6270  . traZODone (DESYREL) tablet 50 mg  50 mg Oral QHS,MR X 1 Laverle Hobby, PA-C   50 mg at 10/23/13 2205  . Warfarin - Pharmacist Dosing Inpatient   Does not apply q1800  Dara Hoyer, [facility]        Lab Results:  Results for orders placed during the hospital encounter of 10/19/13 (from the past 48 hour(s))  PROTIME-INR     Status: Abnormal   Collection Time    10/23/13  6:20 AM      Result Value Ref Range   Prothrombin Time 22.0 (*) 11.6 - 15.2 seconds   INR 1.92 (*) 0.00 - 1.49   Comment: Performed at [facility]     Status: Abnormal   Collection Time    10/24/13  6:42 AM      Result Value Ref Range   Prothrombin Time 21.7 (*) 11.6 - 15.2 seconds   INR 1.89 (*) 0.00 - 1.49   Comment: Performed at [facility]    Physical Findings: AIMS: Facial and Oral Movements Muscles of Facial Expression: None, normal Lips and Perioral Area: None, normal Jaw: None, normal Tongue: None, normal,Extremity Movements Upper (arms, wrists, hands, fingers): None, normal Lower (legs, knees, ankles, toes): None, normal, Trunk Movements Neck, shoulders, hips: None, normal, Overall Severity Severity of abnormal movements (highest score from questions above): None, normal Incapacitation due to abnormal movements: None, normal Patient's awareness of abnormal movements (rate only patient's report): No Awareness, Dental Status Current problems with teeth and/or dentures?: No Does patient usually wear dentures?: No  CIWA:    COWS:     Assessment:  Patient appears to be doing well and reports minimal symptoms. She is taking her medication as directed and reports no new problems. Continue current plan of care with possible discharge tomorrow.  Treatment Plan Summary: Daily contact with patient to assess and evaluate symptoms and progress in treatment Medication management See below  Plan: Continue inpatient treatment. Continue Risperidone   2 mgrs BID Continue Claritin 10 mgrs QDAY Continue Valium 5 mgrs BID PRN Anxiety On Coumadin as per  Pharmacy  Medical Decision Making Problem Points:  Established problem,  stable/improving (1) Data Points:  Review of medication regiment & side effects (2)  I certify that inpatient services furnished can reasonably be expected to improve the patient's condition.  Marlane Hatcher. Mashburn RPAC 4:40 PM 10/24/2013 I have seen patient and agree with treatment and disposition.

## 2013-10-25 DIAGNOSIS — F259 Schizoaffective disorder, unspecified: Secondary | ICD-10-CM

## 2013-10-25 LAB — PROTIME-INR
INR: 1.77 — ABNORMAL HIGH (ref 0.00–1.49)
Prothrombin Time: 20.6 seconds — ABNORMAL HIGH (ref 11.6–15.2)

## 2013-10-25 MED ORDER — CYCLOBENZAPRINE HCL 10 MG PO TABS: 10.0000 mg | ORAL_TABLET | Freq: Three times a day (TID) | ORAL | Status: DC | PRN

## 2013-10-25 MED ORDER — BENZTROPINE MESYLATE 1 MG PO TABS: 1.0000 mg | ORAL_TABLET | Freq: Every day | ORAL | Status: DC

## 2013-10-25 MED ORDER — NICOTINE 21 MG/24HR TD PT24: 21.0000 mg | MEDICATED_PATCH | Freq: Every day | TRANSDERMAL | Status: DC

## 2013-10-25 MED ORDER — RISPERIDONE 2 MG PO TABS: 2.0000 mg | ORAL_TABLET | Freq: Two times a day (BID) | ORAL | Status: DC

## 2013-10-25 MED ORDER — TRAZODONE HCL 50 MG PO TABS: 50.0000 mg | ORAL_TABLET | Freq: Every evening | ORAL | Status: DC | PRN

## 2013-10-25 MED ORDER — CETIRIZINE HCL 10 MG PO TABS: 10.0000 mg | ORAL_TABLET | Freq: Every day | ORAL | Status: DC

## 2013-10-25 MED ORDER — DIAZEPAM 5 MG PO TABS: 5.0000 mg | ORAL_TABLET | Freq: Two times a day (BID) | ORAL | Status: DC | PRN

## 2013-10-25 MED ORDER — WARFARIN SODIUM 10 MG PO TABS
10.0000 mg | ORAL_TABLET | Freq: Once | ORAL | Status: AC
  Administered 2013-10-25: 10 mg via ORAL
  Filled 2013-10-25: qty 1

## 2013-10-25 NOTE — Progress Notes (Signed)
Psychoeducational Group Note  Date: 10/25/2013 Time: 1015  Group Topic/Focus:  Making Healthy Choices:   The focus of this group is to help patients identify negative/unhealthy choices they were using prior to admission and identify positive/healthier coping strategies to replace them upon discharge.  Participation Level:  Minimal  Participation Quality:  Attentive  Affect:  Flat  Cognitive:  Appropriate  Insight:  Engaged  Engagement in Group:  Engaged  Additional Comments:    10/25/2013,11:12 AM Lauralyn Primes

## 2013-10-25 NOTE — Progress Notes (Signed)
[facility] Group Notes:  (Nursing/MHT/Case Management/Adjunct)  Date:  10/25/2013  Time:  12:25 AM  Type of Therapy:  Psychoeducational Skills  Participation Level:  Active  Participation Quality:  Appropriate  Affect:  Appropriate  Cognitive:  Appropriate  Insight:  Good  Engagement in Group:  Engaged  Modes of Intervention:  Education  Summary of Progress/Problems: The patient shared in group this evening that she had a good day overall. The patient indicated that she learned a number of things from the groups. She was also pleased to report that she slept well last evening. The patient mentioned that she is worried about her daughter but would not go into further detail. In terms of the theme for the day, her support system will be comprised of her boyfriend of the last 10 years.   Fatima Fedie S 10/25/2013, 12:25 AM

## 2013-10-25 NOTE — Discharge Summary (Signed)
Physician Discharge Summary Note  Patient:  [name] is an 43 y.o., female MRN:  [id number] DOB:  Nov 05, 1970 Patient phone:  [phone] (home)  Patient address:   [address] [place] [address],  Total Time spent with patient: 20 minutes  Date of Admission:  10/19/2013 Date of Discharge: 10/25/2013  Reason for Admission:  Psychosis Discharge Diagnoses: Paranoid Schizophrenia, PTSD, by hx. Active Problems:   Schizoaffective disorder, depressive type   Psychiatric Specialty Exam:  Please see D/C SRA Physical Exam  ROS  Blood pressure 102/67, pulse 65, temperature 98.6 F (37 C), temperature source Oral, resp. rate 18, height 5' 4.75" (1.645 m), weight 70.761 kg (156 lb), last menstrual period 10/07/2013.Body mass index is 26.15 kg/(m^2).   Past Psychiatric History:  See H&P Diagnosis:  Hospitalizations:  Outpatient Care:  Substance Abuse Care:  Self-Mutilation:  Suicidal Attempts:  Violent Behaviors:   Musculoskeletal: Strength & Muscle Tone: within normal limits Gait & Station: normal Patient leans: N/A  DSM5:  Schizophrenia Disorders:  Paranoid schizophrenia Obsessive-Compulsive Disorders:   Trauma-Stressor Disorders:  Posttraumatic Stress Disorder (309.81) Substance/Addictive Disorders:  Hx of Opioid and Benzodiazepine dependence Depressive Disorders:  Major Depressive Disorder - with Psychotic Features (296.24)  Axis Diagnosis:   AXIS I:  Paranoid Schizophrenia, PTSD by hx AXIS II:  Deferred AXIS III:   Past Medical History  Diagnosis Date  . Pulmonary emboli   . Schizophrenia   . Cancer   . Uterus cancer   . Anxiety   . Depression   . PTSD (post-traumatic stress disorder)    AXIS IV:  economic problems, other psychosocial or environmental problems, problems related to social environment and problems with primary support group AXIS V:  51-60 moderate symptoms  Level of Care:  OP  Hospital Course:    Kimberleigh Mehan was admitted to the adult  unit where she was evaluated and her symptoms were identified. Medication management was discussed and implemented. She was encouraged to participate in unit programming. Medical problems were identified and treated appropriately. Home medication was restarted as needed.                       She was evaluated each day by a clinical provider to ascertain the patient's response to treatment.  Improvement was noted by the patient's report of decreasing symptoms, improved sleep and appetite, affect, medication tolerance, behavior, and participation in unit programming.  The patient was asked each day to complete a self inventory noting mood, mental status, pain, new symptoms, anxiety and concerns.         She responded well to medication and being in a therapeutic and supportive environment. Positive and appropriate behavior was noted and the patient was motivated for recovery.  She worked closely with the treatment team and case manager to develop a discharge plan with appropriate goals. Coping skills, problem solving as well as relaxation therapies were also part of the unit programming.         By the day of discharge she was in much improved condition than upon admission.  Symptoms were reported as significantly decreased or resolved completely.  The patient denied SI/HI and voiced no AVH. She was motivated to continue taking medication with a goal of continued improvement in mental health.          Evalene Vath was discharged home with a plan to follow up as noted below.  Consults:  psychiatry  Significant Diagnostic Studies:  None  Discharge Vitals:  Blood pressure 102/67, pulse 65, temperature 98.6 F (37 C), temperature source Oral, resp. rate 18, height 5' 4.75" (1.645 m), weight 70.761 kg (156 lb), last menstrual period 10/07/2013. Body mass index is 26.15 kg/(m^2). Lab Results:   Results for orders placed during the hospital encounter of 10/19/13 (from the past 72 hour(s))  PROTIME-INR      Status: Abnormal   Collection Time    10/23/13  6:20 AM      Result Value Ref Range   Prothrombin Time 22.0 (*) 11.6 - 15.2 seconds   INR 1.92 (*) 0.00 - 1.49   Comment: Performed at [facility]     Status: Abnormal   Collection Time    10/24/13  6:42 AM      Result Value Ref Range   Prothrombin Time 21.7 (*) 11.6 - 15.2 seconds   INR 1.89 (*) 0.00 - 1.49   Comment: Performed at [facility]     Status: Abnormal   Collection Time    10/25/13  6:18 AM      Result Value Ref Range   Prothrombin Time 20.6 (*) 11.6 - 15.2 seconds   INR 1.77 (*) 0.00 - 1.49   Comment: Performed at [facility]    Physical Findings: AIMS: Facial and Oral Movements Muscles of Facial Expression: None, normal Lips and Perioral Area: None, normal Jaw: None, normal Tongue: None, normal,Extremity Movements Upper (arms, wrists, hands, fingers): None, normal Lower (legs, knees, ankles, toes): None, normal, Trunk Movements Neck, shoulders, hips: None, normal, Overall Severity Severity of abnormal movements (highest score from questions above): None, normal Incapacitation due to abnormal movements: None, normal Patient's awareness of abnormal movements (rate only patient's report): No Awareness, Dental Status Current problems with teeth and/or dentures?: No Does patient usually wear dentures?: No  CIWA:    COWS:     Psychiatric Specialty Exam: See Psychiatric Specialty Exam and Suicide Risk Assessment completed by Attending Physician prior to discharge.  Discharge destination:  Home  Is patient on multiple antipsychotic therapies at discharge:  No   Has Patient had three or more failed trials of antipsychotic monotherapy by history:  No  Recommended Plan for Multiple Antipsychotic Therapies: NA  Discharge Instructions   Diet - low sodium heart healthy    Complete by:  As directed      Discharge instructions     Complete by:  As directed   Take all of your medications as directed. Be sure to keep all of your follow up appointments.  If you are unable to keep your follow up appointment, call your Doctor's office to let them know, and reschedule.  Make sure that you have enough medication to last until your appointment. Be sure to get plenty of rest. Going to bed at the same time each night will help. Try to avoid sleeping during the day.  Increase your activity as tolerated. Regular exercise will help you to sleep better and improve your mental health. Eating a heart healthy diet is recommended. Try to avoid salty or fried foods. Be sure to avoid all alcohol and illegal drugs.     Increase activity slowly    Complete by:  As directed             Medication List       Indication   benztropine 1 MG tablet  Commonly known as:  COGENTIN  Take 1 tablet (1 mg total) by mouth at bedtime.  Indication:  Extrapyramidal Reaction caused by Medications     cetirizine 10 MG tablet  Commonly known as:  ZYRTEC  Take 1 tablet (10 mg total) by mouth daily.   Indication:  Hayfever     cyclobenzaprine 10 MG tablet  Commonly known as:  FLEXERIL  Take 1 tablet (10 mg total) by mouth 3 (three) times daily as needed for muscle spasms.   Indication:  Muscle Spasm     diazepam 5 MG tablet  Commonly known as:  VALIUM  Take 1 tablet (5 mg total) by mouth every 12 (twelve) hours as needed for anxiety (sleep).      nicotine 21 mg/24hr patch  Commonly known as:  NICODERM CQ  Place 1 patch (21 mg total) onto the skin daily.      risperiDONE 2 MG tablet  Commonly known as:  RISPERDAL  Take 1 tablet (2 mg total) by mouth 2 (two) times daily.   Indication:  Manic-Depression     traZODone 50 MG tablet  Commonly known as:  DESYREL  Take 1 tablet (50 mg total) by mouth at bedtime and may repeat dose one time if needed.   Indication:  Trouble Sleeping     warfarin 5 MG tablet  Commonly known as:  COUMADIN  Take  5-7.5 mg by mouth daily. 5mg  daily except 7.5mg  on Sundays    anticoagulation         Follow-up Information   Follow up with [facility] On 10/27/2013. (Walk in on this date for hospital discharge appointment.  Walk in clinic is Monday - Friday 8 am - 3 pm.  They will than schedule you for medication management and therapy.  )    Contact information:   [address] [place] [address] Phone: [phone] Fax: [phone]      Follow-up recommendations:   Activities: Resume activity as tolerated. Diet: Heart healthy low sodium diet Tests: Follow up testing will be determined by your out patient provider. Comments:  Continue routine Coumadin blood testing as recommended by our Pharm D and your physician.  Total Discharge Time:  Less than 30 minutes.  Signed: MASHBURN,NEIL 10/25/2013, 8:56 AM I have examined the patient and agree with the discharge plan and findings. I have done suicide assessment.
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
## 2013-10-25 NOTE — Progress Notes (Signed)
D) Pt being discharged to home accompanied by her husband. Affect and mood are appropriate. Pt denies SI, HI, delusions and hallucinations. Pt rates her depression , mood and anxiety all at a 0 A) All medications explained to Pt., and Pt given samples.Pt given support, reassurance and praise. Encouragement given. All discharge plans explained to Pt. All belongings returned to Pt. R) Pt denies SI and HI, delusions and Hallucinations.

## 2013-10-25 NOTE — Progress Notes (Signed)
ANTICOAGULATION CONSULT NOTE - Follow Up Consult  Pharmacy Consult for coumadin   Allergies  Allergen Reactions  . Darvocet [Propoxyphene N-Acetaminophen] Nausea And Vomiting and Other (See Comments)    Shaking     Patient Measurements: Height: 5' 4.75" (164.5 cm) Weight: 156 lb (70.761 kg) IBW/kg (Calculated) : 56.43    Labs:  Recent Labs  10/23/13 0620 10/24/13 0642 10/25/13 0618  LABPROT 22.0* 21.7* 20.6*  INR 1.92* 1.89* 1.77*    Estimated Creatinine Clearance: 77.5 ml/min (by C-G formula based on Cr of 0.91).   Medications:  Scheduled:  . benztropine  1 mg Oral QHS  . loratadine  10 mg Oral Daily  . nicotine  21 mg Transdermal Daily  . risperiDONE  2 mg Oral BID  . traZODone  50 mg Oral QHS,MR X 1  . warfarin  10 mg Oral ONCE-1800  . Warfarin - Pharmacist Dosing Inpatient   Does not apply q1800    Assessment: INR still decreasing after 7.5 mg dose on 7/25 No problems noted with therapy   Goal of Therapy:  INR 2-3    Plan:  Coumadin 10 mg x 1 today at 1800  PT/INR in AM   Einar Grad Marie 10/25/2013,7:28 AM

## 2013-10-25 NOTE — Progress Notes (Signed)
Psychoeducational Group Note  Date:  10/25/2013 Time:  1015  Group Topic/Focus:  Making Healthy Choices:   The focus of this group is to help patients identify negative/unhealthy choices they were using prior to admission and identify positive/healthier coping strategies to replace them upon discharge.  Participation Level:  Active  Participation Quality:  Appropriate  Affect:  Appropriate  Cognitive:  Oriented  Insight:  Improving  Engagement in Group:  Engaged  Additional Comments:  Pt was involved and participated in the discussions.  Paulino Rily 10/25/2013

## 2013-10-29 NOTE — Progress Notes (Signed)
Patient Discharge Instructions:  After Visit Summary (AVS):   Faxed to:  10/29/13 Discharge Summary Note:   Faxed to:  10/29/13 Psychiatric Admission Assessment Note:   Faxed to:  10/29/13 Suicide Risk Assessment - Discharge Assessment:   Faxed to:  10/29/13 Faxed/Sent to the Next Level Care provider:  10/29/13 Faxed to [facility] @ [place], 10/29/2013, 2:10 PM

## 2013-12-19 ENCOUNTER — Other Ambulatory Visit (HOSPITAL_COMMUNITY): Payer: Self-pay | Admitting: Physician Assistant

## 2013-12-22 ENCOUNTER — Encounter (HOSPITAL_COMMUNITY): Payer: Self-pay | Admitting: Emergency Medicine

## 2013-12-22 ENCOUNTER — Emergency Department (HOSPITAL_COMMUNITY)
Admission: EM | Admit: 2013-12-22 | Discharge: 2013-12-23 | Disposition: A | Discharge status: Home / Self Care | Payer: Medicaid Other | Attending: Emergency Medicine | Admitting: Emergency Medicine

## 2013-12-22 ENCOUNTER — Emergency Department (HOSPITAL_COMMUNITY): Payer: Medicaid Other

## 2013-12-22 DIAGNOSIS — D689 Coagulation defect, unspecified: Secondary | ICD-10-CM | POA: Diagnosis not present

## 2013-12-22 DIAGNOSIS — Y9389 Activity, other specified: Secondary | ICD-10-CM | POA: Diagnosis not present

## 2013-12-22 DIAGNOSIS — Y92410 Unspecified street and highway as the place of occurrence of the external cause: Secondary | ICD-10-CM | POA: Insufficient documentation

## 2013-12-22 DIAGNOSIS — F17200 Nicotine dependence, unspecified, uncomplicated: Secondary | ICD-10-CM | POA: Insufficient documentation

## 2013-12-22 DIAGNOSIS — F4329 Adjustment disorder with other symptoms: Secondary | ICD-10-CM

## 2013-12-22 DIAGNOSIS — F200 Paranoid schizophrenia: Secondary | ICD-10-CM

## 2013-12-22 DIAGNOSIS — F209 Schizophrenia, unspecified: Secondary | ICD-10-CM | POA: Diagnosis not present

## 2013-12-22 DIAGNOSIS — Z8542 Personal history of malignant neoplasm of other parts of uterus: Secondary | ICD-10-CM | POA: Diagnosis not present

## 2013-12-22 DIAGNOSIS — F411 Generalized anxiety disorder: Secondary | ICD-10-CM | POA: Insufficient documentation

## 2013-12-22 DIAGNOSIS — Z86711 Personal history of pulmonary embolism: Secondary | ICD-10-CM | POA: Insufficient documentation

## 2013-12-22 DIAGNOSIS — Z7901 Long term (current) use of anticoagulants: Secondary | ICD-10-CM | POA: Diagnosis not present

## 2013-12-22 DIAGNOSIS — G8929 Other chronic pain: Secondary | ICD-10-CM | POA: Diagnosis not present

## 2013-12-22 DIAGNOSIS — IMO0002 Reserved for concepts with insufficient information to code with codable children: Secondary | ICD-10-CM | POA: Diagnosis not present

## 2013-12-22 DIAGNOSIS — Z859 Personal history of malignant neoplasm, unspecified: Secondary | ICD-10-CM | POA: Diagnosis not present

## 2013-12-22 LAB — COMPREHENSIVE METABOLIC PANEL
ALT: 14 U/L (ref 0–35)
AST: 32 U/L (ref 0–37)
Albumin: 3.9 g/dL (ref 3.5–5.2)
Alkaline Phosphatase: 76 U/L (ref 39–117)
Anion gap: 15 (ref 5–15)
BUN: 16 mg/dL (ref 6–23)
CHLORIDE: 92 meq/L — AB (ref 96–112)
CO2: 26 mEq/L (ref 19–32)
Calcium: 9.4 mg/dL (ref 8.4–10.5)
Creatinine, Ser: 1.07 mg/dL (ref 0.50–1.10)
GFR calc Af Amer: 72 mL/min — ABNORMAL LOW (ref >90)
GFR, EST NON AFRICAN AMERICAN: 62 mL/min — AB (ref >90)
Glucose, Bld: 67 mg/dL — ABNORMAL LOW (ref 70–99)
Potassium: 3.1 mEq/L — ABNORMAL LOW (ref 3.7–5.3)
SODIUM: 133 meq/L — AB (ref 137–147)
Total Bilirubin: <0.2 mg/dL — ABNORMAL LOW (ref 0.3–1.2)
Total Protein: 7.8 g/dL (ref 6.0–8.3)

## 2013-12-22 LAB — PROTIME-INR
INR: >10 (ref 0.00–1.49)
Prothrombin Time: >90 seconds — ABNORMAL HIGH (ref 11.6–15.2)

## 2013-12-22 LAB — RAPID URINE DRUG SCREEN, HOSP PERFORMED
Amphetamines: NOT DETECTED
BENZODIAZEPINES: POSITIVE — AB
Barbiturates: NOT DETECTED
COCAINE: NOT DETECTED
Opiates: POSITIVE — AB
Tetrahydrocannabinol: POSITIVE — AB

## 2013-12-22 LAB — CBC WITH DIFFERENTIAL/PLATELET
BASOS PCT: 0 % (ref 0–1)
Basophils Absolute: 0.1 10*3/uL (ref 0.0–0.1)
EOS ABS: 0.2 10*3/uL (ref 0.0–0.7)
EOS PCT: 2 % (ref 0–5)
HCT: 43.1 % (ref 36.0–46.0)
Hemoglobin: 15.3 g/dL — ABNORMAL HIGH (ref 12.0–15.0)
LYMPHS ABS: 3.4 10*3/uL (ref 0.7–4.0)
Lymphocytes Relative: 30 % (ref 12–46)
MCH: 33.3 pg (ref 26.0–34.0)
MCHC: 35.5 g/dL (ref 30.0–36.0)
MCV: 93.7 fL (ref 78.0–100.0)
MONOS PCT: 14 % — AB (ref 3–12)
Monocytes Absolute: 1.6 10*3/uL — ABNORMAL HIGH (ref 0.1–1.0)
NEUTROS PCT: 54 % (ref 43–77)
Neutro Abs: 6.2 10*3/uL (ref 1.7–7.7)
PLATELETS: 392 10*3/uL (ref 150–400)
RBC: 4.6 MIL/uL (ref 3.87–5.11)
RDW: 14.2 % (ref 11.5–15.5)
WBC: 11.4 10*3/uL — ABNORMAL HIGH (ref 4.0–10.5)

## 2013-12-22 LAB — ETHANOL: Alcohol, Ethyl (B): <11 mg/dL (ref 0–11)

## 2013-12-22 LAB — SALICYLATE LEVEL

## 2013-12-22 LAB — HCG, SERUM, QUALITATIVE: Preg, Serum: NEGATIVE

## 2013-12-22 LAB — ACETAMINOPHEN LEVEL: Acetaminophen (Tylenol), Serum: <15 ug/mL (ref 10–30)

## 2013-12-22 MED ORDER — CYCLOBENZAPRINE HCL 10 MG PO TABS: 5.0000 mg | ORAL_TABLET | Freq: Three times a day (TID) | ORAL | Status: DC | PRN

## 2013-12-22 MED ORDER — VITAMIN K1 10 MG/ML IJ SOLN
10.0000 mg | Freq: Once | INTRAVENOUS | Status: AC
  Administered 2013-12-22: 10 mg via INTRAVENOUS
  Filled 2013-12-22: qty 1

## 2013-12-22 MED ORDER — ACETAMINOPHEN 325 MG PO TABS: 650.0000 mg | ORAL_TABLET | ORAL | Status: DC | PRN

## 2013-12-22 MED ORDER — OXYCODONE-ACETAMINOPHEN 5-325 MG PO TABS
2.0000 | ORAL_TABLET | Freq: Once | ORAL | Status: AC
  Administered 2013-12-22: 2 via ORAL
  Filled 2013-12-22: qty 2

## 2013-12-22 NOTE — ED Notes (Addendum)
INR > 10 critical result called from lab. Dr. Jeneen Rinks made aware.
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
## 2013-12-22 NOTE — ED Notes (Signed)
Pt is agitated, requesting lab be drawn because she feels that her levels are high. She is concerned about her sodium and her "warfarin" levels. Pt having difficulty staying focused on conversation, many questions need to be repeated. Friend at bedside is concerned about her behavior.
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
## 2013-12-22 NOTE — ED Notes (Signed)
Pt. and belongings wanded by security 

## 2013-12-22 NOTE — ED Notes (Signed)
Pt reports recurrent back pain. Also concerned about "blood levels". Concerned about coumadin levels

## 2013-12-23 DIAGNOSIS — F4323 Adjustment disorder with mixed anxiety and depressed mood: Secondary | ICD-10-CM

## 2013-12-23 LAB — CBC WITH DIFFERENTIAL/PLATELET
BASOS PCT: 0 % (ref 0–1)
Basophils Absolute: 0.1 10*3/uL (ref 0.0–0.1)
Eosinophils Absolute: 0.2 10*3/uL (ref 0.0–0.7)
Eosinophils Relative: 1 % (ref 0–5)
HCT: 44 % (ref 36.0–46.0)
Hemoglobin: 15.7 g/dL — ABNORMAL HIGH (ref 12.0–15.0)
Lymphocytes Relative: 10 % — ABNORMAL LOW (ref 12–46)
Lymphs Abs: 1.6 10*3/uL (ref 0.7–4.0)
MCH: 32.7 pg (ref 26.0–34.0)
MCHC: 35.7 g/dL (ref 30.0–36.0)
MCV: 91.7 fL (ref 78.0–100.0)
Monocytes Absolute: 1.8 10*3/uL — ABNORMAL HIGH (ref 0.1–1.0)
Monocytes Relative: 12 % (ref 3–12)
NEUTROS PCT: 77 % (ref 43–77)
Neutro Abs: 12.2 10*3/uL — ABNORMAL HIGH (ref 1.7–7.7)
PLATELETS: 434 10*3/uL — AB (ref 150–400)
RBC: 4.8 MIL/uL (ref 3.87–5.11)
RDW: 13.8 % (ref 11.5–15.5)
WBC: 15.8 10*3/uL — ABNORMAL HIGH (ref 4.0–10.5)

## 2013-12-23 LAB — PROTIME-INR
INR: 1.3 (ref 0.00–1.49)
PROTHROMBIN TIME: 16.2 s — AB (ref 11.6–15.2)

## 2013-12-23 NOTE — Progress Notes (Signed)
Pt was given [facility] [employer] packet. Pt was explained the purpose of the packets. Pt did not have any questions.

## 2013-12-23 NOTE — ED Notes (Signed)
Call to significant other about d/c. States he will be here at 1730 for pick-up.

## 2013-12-23 NOTE — BH Assessment (Signed)
Dr. Lovena Le and Earleen Newport, NP recommended discharge with outpatient follow up services. TTS staff Bradi offered patient outpatient referrals and a outpatient follow-up appointment. Patient refused stating she would like to follow up with Joelene Millin at [facility]. No further services offered. Patient ready to discharge.

## 2013-12-23 NOTE — BHH Suicide Risk Assessment (Signed)
Suicide Risk Assessment  Discharge Assessment     Demographic Factors:  Caucasian  Total Time spent with patient: 45 minutes  Psychiatric Specialty Exam:     Blood pressure 112/73, pulse 96, temperature 98 F (36.7 C), temperature source Oral, resp. rate 18, weight 56.7 kg (125 lb), last menstrual period 11/16/2013, SpO2 97.00%.Body mass index is 20.95 kg/(m^2).  General Appearance: Casual  Eye Contact::  Fair  Speech:  Clear and Coherent  Volume:  Normal  Mood:  Irritable  Affect:  Labile  Thought Process:  Coherent  Orientation:  Full (Time, Place, and Person)  Thought Content:  denies hallucinations but appears to be hearing voices  Suicidal Thoughts:  No  Homicidal Thoughts:  No  Memory:  Immediate;   Good Recent;   Good Remote;   Good  Judgement:  Fair  Insight:  Shallow  Psychomotor Activity:  Normal  Concentration:  Good  Recall:  [place] of Knowledge:Good  Language: Good  Akathisia:  Negative  Handed:  Right  AIMS (if indicated):     Assets:  Housing Social Support  Sleep:       Musculoskeletal: Strength & Muscle Tone: within normal limits Gait & Station: normal Patient leans: N/A   Mental Status Per Nursing Assessment::   On Admission:     Current Mental Status by Physician: NA  Loss Factors: NA  Historical Factors: NA  Risk Reduction Factors:   NA  Continued Clinical Symptoms:  Schizophrenia:   Paranoid or undifferentiated type  Cognitive Features That Contribute To Risk:  Closed-mindedness    Suicide Risk:  Minimal: No identifiable suicidal ideation.  Patients presenting with no risk factors but with morbid ruminations; may be classified as minimal risk based on the severity of the depressive symptoms  Discharge Diagnoses:   AXIS I:  Adjustment Disorder with Mixed Emotional Features AXIS II:  Deferred AXIS III:   Past Medical History  Diagnosis Date  . Pulmonary emboli   . Schizophrenia   . Cancer   . Uterus cancer   .  Anxiety   . Depression   . PTSD (post-traumatic stress disorder)    AXIS IV:  heard she has an untreatable cancer, she says AXIS V:  61-70 mild symptoms  Plan Of Care/Follow-up recommendations:  Activity:  reaume usual activity Diet:  resume usual diet  Is patient on multiple antipsychotic therapies at discharge:  No   Has Patient had three or more failed trials of antipsychotic monotherapy by history:  No  Recommended Plan for Multiple Antipsychotic Therapies: NA    TAYLOR,GERALD D 12/23/2013, 11:34 AM

## 2013-12-23 NOTE — Consult Note (Signed)
[facility] Consult   Reason for Consult:  Reportedly upset over hearing that she has throat cancer Referring Physician:  ER MD  [name] is an 43 y.o. female. Total Time spent with patient: 45 minutes  Assessment: AXIS I:  Adjustment Disorder with Mixed Emotional Features AXIS II:  Deferred AXIS III:   Past Medical History  Diagnosis Date  . Pulmonary emboli   . Schizophrenia   . Cancer   . Uterus cancer   . Anxiety   . Depression   . PTSD (post-traumatic stress disorder)    AXIS IV:  problems with primary support group and possible cancer AXIS V:  51-60 moderate symptoms  Plan:  No evidence of imminent risk to self or others at present.    Subjective:   [name] is a 43 y.o. female patient admitted with recent news of cancer reportedly.  HPI:  [name] is a frequent user of the mental health system and in talking to the worker who tries to make sure the transitions between inpatient and outpatient are smooth, the worker says this is her baseline.  She is capable of saying she has cancer when she does not.  She does hear voices.  She denies hearing voices today but when observed without her knowing she seems to be responding to voices by the facial expressions and hand gestures.  She lives with a man reportedly whom she has an on again and off again relationship.  She would not talk to [place] about that relationship except to say everything was okay and she wants to go home.  The issue of his having an affair reportedly is the kind of thing that comes up and may or may not be true based on her past behavior.  She was offered risperidone and diazepam but refused both.  She is clear that she is not suicidal or homicidal and wants to go home today. HPI Elements:   Location:  psychosis. Quality:  not complaining of voices but is upset over the diagnosis of cancer she says. Severity:  not suicidal she says and voices are not commanding her to do anything  apparently. Timing:  heard that she has cancer and may not live beyond 4 months, she says. Duration:  years. Context:  as above.  Past Psychiatric History: Past Medical History  Diagnosis Date  . Pulmonary emboli   . Schizophrenia   . Cancer   . Uterus cancer   . Anxiety   . Depression   . PTSD (post-traumatic stress disorder)     reports that she has been smoking Cigarettes.  She has a 45 pack-year smoking history. She has never used smokeless tobacco. She reports that she drinks alcohol. She reports that she uses illicit drugs (Marijuana). Family History  Problem Relation Age of Onset  . Schizophrenia Neg Hx   . Cancer Other            Allergies:   Allergies  Allergen Reactions  . Darvocet [Propoxyphene N-Acetaminophen] Nausea And Vomiting and Other (See Comments)    Shaking     ACT Assessment Complete:  Yes:    Educational Status    Risk to Self: Risk to self with the past 6 months Is patient at risk for suicide?: No Substance abuse history and/or treatment for substance abuse?: No  Risk to Others:    Abuse:    Prior Inpatient Therapy:    Prior Outpatient Therapy:    Additional Information:  Objective: Blood pressure 112/73, pulse 96, temperature 98 F (36.7 C), temperature source Oral, resp. rate 18, weight 56.7 kg (125 lb), last menstrual period 11/16/2013, SpO2 97.00%.Body mass index is 20.95 kg/(m^2). Results for orders placed during the hospital encounter of 12/22/13 (from the past 72 hour(s))  PROTIME-INR     Status: Abnormal   Collection Time    12/22/13  3:50 PM      Result Value Ref Range   Prothrombin Time >90.0 (*) 11.6 - 15.2 seconds   INR >10.00 (*) 0.00 - 1.49   Comment: REPEATED TO VERIFY     SPECIMEN CHECKED FOR CLOTS     CHECKED VOLUME     RESULT CHECKED     ON COUMADIN     CRITICAL RESULT CALLED TO, READ BACK BY AND VERIFIED WITH:     Marisa Hua RN 1714 12/22/13 A NAVARRO  HCG, SERUM, QUALITATIVE      Status: None   Collection Time    12/22/13  3:53 PM      Result Value Ref Range   Preg, Serum NEGATIVE  NEGATIVE   Comment:            THE SENSITIVITY OF THIS     METHODOLOGY IS >10 mIU/mL.  CBC WITH DIFFERENTIAL     Status: Abnormal   Collection Time    12/22/13  3:53 PM      Result Value Ref Range   WBC 11.4 (*) 4.0 - 10.5 K/uL   RBC 4.60  3.87 - 5.11 MIL/uL   Hemoglobin 15.3 (*) 12.0 - 15.0 g/dL   HCT 43.1  36.0 - 46.0 %   MCV 93.7  78.0 - 100.0 fL   MCH 33.3  26.0 - 34.0 pg   MCHC 35.5  30.0 - 36.0 g/dL   RDW 14.2  11.5 - 15.5 %   Platelets 392  150 - 400 K/uL   Neutrophils Relative % 54  43 - 77 %   Neutro Abs 6.2  1.7 - 7.7 K/uL   Lymphocytes Relative 30  12 - 46 %   Lymphs Abs 3.4  0.7 - 4.0 K/uL   Monocytes Relative 14 (*) 3 - 12 %   Monocytes Absolute 1.6 (*) 0.1 - 1.0 K/uL   Eosinophils Relative 2  0 - 5 %   Eosinophils Absolute 0.2  0.0 - 0.7 K/uL   Basophils Relative 0  0 - 1 %   Basophils Absolute 0.1  0.0 - 0.1 K/uL  ACETAMINOPHEN LEVEL     Status: None   Collection Time    12/22/13  5:33 PM      Result Value Ref Range   Acetaminophen (Tylenol), Serum <15.0  10 - 30 ug/mL   Comment:            THERAPEUTIC CONCENTRATIONS VARY     SIGNIFICANTLY. A RANGE OF 10-30     ug/mL MAY BE AN EFFECTIVE     CONCENTRATION FOR MANY PATIENTS.     HOWEVER, SOME ARE BEST TREATED     AT CONCENTRATIONS OUTSIDE THIS     RANGE.     ACETAMINOPHEN CONCENTRATIONS     >150 ug/mL AT 4 HOURS AFTER     INGESTION AND >50 ug/mL AT 12     HOURS AFTER INGESTION ARE     OFTEN ASSOCIATED WITH TOXIC     REACTIONS.  ETHANOL     Status: None   Collection Time    12/22/13  5:33 PM  Result Value Ref Range   Alcohol, Ethyl (B) <11  0 - 11 mg/dL   Comment:            LOWEST DETECTABLE LIMIT FOR     SERUM ALCOHOL IS 11 mg/dL     FOR MEDICAL PURPOSES ONLY  SALICYLATE LEVEL     Status: Abnormal   Collection Time    12/22/13  5:33 PM      Result Value Ref Range   Salicylate Lvl <8.3  (*) 2.8 - 20.0 mg/dL  COMPREHENSIVE METABOLIC PANEL     Status: Abnormal   Collection Time    12/22/13  5:33 PM      Result Value Ref Range   Sodium 133 (*) 137 - 147 mEq/L   Potassium 3.1 (*) 3.7 - 5.3 mEq/L   Chloride 92 (*) 96 - 112 mEq/L   CO2 26  19 - 32 mEq/L   Glucose, Bld 67 (*) 70 - 99 mg/dL   BUN 16  6 - 23 mg/dL   Creatinine, Ser 1.07  0.50 - 1.10 mg/dL   Calcium 9.4  8.4 - 10.5 mg/dL   Total Protein 7.8  6.0 - 8.3 g/dL   Albumin 3.9  3.5 - 5.2 g/dL   AST 32  0 - 37 U/L   ALT 14  0 - 35 U/L   Alkaline Phosphatase 76  39 - 117 U/L   Total Bilirubin <0.2 (*) 0.3 - 1.2 mg/dL   GFR calc non Af Amer 62 (*) >90 mL/min   GFR calc Af Amer 72 (*) >90 mL/min   Comment: (NOTE)     The eGFR has been calculated using the CKD EPI equation.     This calculation has not been validated in all clinical situations.     eGFR's persistently <90 mL/min signify possible Chronic Kidney     Disease.   Anion gap 15  5 - 15  URINE RAPID DRUG SCREEN (HOSP PERFORMED)     Status: Abnormal   Collection Time    12/22/13  5:57 PM      Result Value Ref Range   Opiates POSITIVE (*) NONE DETECTED   Cocaine NONE DETECTED  NONE DETECTED   Benzodiazepines POSITIVE (*) NONE DETECTED   Amphetamines NONE DETECTED  NONE DETECTED   Tetrahydrocannabinol POSITIVE (*) NONE DETECTED   Barbiturates NONE DETECTED  NONE DETECTED   Comment:            DRUG SCREEN FOR MEDICAL PURPOSES     ONLY.  IF CONFIRMATION IS NEEDED     FOR ANY PURPOSE, NOTIFY LAB     WITHIN 5 DAYS.                LOWEST DETECTABLE LIMITS     FOR URINE DRUG SCREEN     Drug Class       Cutoff (ng/mL)     Amphetamine      1000     Barbiturate      200     Benzodiazepine   151     Tricyclics       761     Opiates          300     Cocaine          300     THC              50   Labs are reviewed and are pertinent for opiates, benzos and THC.  Current Facility-Administered  Medications  Medication Dose Route Frequency Provider Last Rate  Last Dose  . acetaminophen (TYLENOL) tablet 650 mg  650 mg Oral Q4H PRN Tanna Furry, MD      . cyclobenzaprine (FLEXERIL) tablet 5 mg  5 mg Oral TID PRN Tanna Furry, MD       Current Outpatient Prescriptions  Medication Sig Dispense Refill  . cyclobenzaprine (FLEXERIL) 10 MG tablet Take 1 tablet (10 mg total) by mouth 3 (three) times daily as needed for muscle spasms.  30 tablet  0  . diazepam (VALIUM) 5 MG tablet Take 1 tablet (5 mg total) by mouth every 12 (twelve) hours as needed for anxiety (sleep).  10 tablet  0  . warfarin (COUMADIN) 5 MG tablet Take 5-7.5 mg by mouth daily. $RemoveBefo'5mg'HXmngmxPxvL$  daily except 7.$RemoveBefore'5mg'dizXlJELYsrfn$  on Sundays        Psychiatric Specialty Exam:     Blood pressure 112/73, pulse 96, temperature 98 F (36.7 C), temperature source Oral, resp. rate 18, weight 56.7 kg (125 lb), last menstrual period 11/16/2013, SpO2 97.00%.Body mass index is 20.95 kg/(m^2).  General Appearance: Casual  Eye Contact::  Fair  Speech:  Clear and Coherent  Volume:  Normal  Mood:  Irritable  Affect:  Labile  Thought Process:  Coherent  Orientation:  Full (Time, Place, and Person)  Thought Content:  denies hallucinations but appears to be hearing voices  Suicidal Thoughts:  No  Homicidal Thoughts:  No  Memory:  Immediate;   Good Recent;   Good Remote;   Good  Judgement:  Fair  Insight:  Shallow  Psychomotor Activity:  Normal  Concentration:  Good  Recall:  Good  Fund of Knowledge:Fair  Language: Negative  Akathisia:  Negative  Handed:  Right  AIMS (if indicated):     Assets:  Housing Social Support  Sleep:   adequate   Musculoskeletal: Strength & Muscle Tone: within normal limits Gait & Station: normal Patient leans: N/A  Treatment Plan Summary: this appears to be her baseline.  She wants to be discharged and will be discharged home to be followed outpatient as she chooses.  she refuses meds before she leaves  TAYLOR,GERALD D 12/23/2013 11:03 AM

## 2013-12-23 NOTE — ED Notes (Signed)
Patient is pacing floors and is responding to internal. Easily redirected. States "I have cancer and have 4 months to live."  Support offered.

## 2013-12-23 NOTE — Consult Note (Signed)
  Review of Systems  Constitutional: Negative.   HENT: Negative.   Eyes: Negative.   Respiratory: Negative.   Cardiovascular: Negative.   Gastrointestinal: Negative.   Genitourinary: Negative.   Musculoskeletal: Negative.   Skin: Negative.   Neurological: Negative.   Endo/Heme/Allergies: Negative.   Psychiatric/Behavioral: Positive for depression and hallucinations.

## 2013-12-23 NOTE — ED Notes (Signed)
Fluids given.  Patient states ride for d/c will be here at 1500-1530.

## 2013-12-24 ENCOUNTER — Emergency Department (HOSPITAL_COMMUNITY)
Admission: EM | Admit: 2013-12-24 | Discharge: 2013-12-25 | Disposition: A | Discharge status: Home / Self Care | Payer: Medicaid Other | Attending: Emergency Medicine | Admitting: Emergency Medicine

## 2013-12-24 ENCOUNTER — Encounter (HOSPITAL_COMMUNITY): Payer: Self-pay | Admitting: Emergency Medicine

## 2013-12-24 DIAGNOSIS — Z8542 Personal history of malignant neoplasm of other parts of uterus: Secondary | ICD-10-CM | POA: Insufficient documentation

## 2013-12-24 DIAGNOSIS — F3289 Other specified depressive episodes: Secondary | ICD-10-CM | POA: Insufficient documentation

## 2013-12-24 DIAGNOSIS — Z79899 Other long term (current) drug therapy: Secondary | ICD-10-CM | POA: Diagnosis not present

## 2013-12-24 DIAGNOSIS — F259 Schizoaffective disorder, unspecified: Secondary | ICD-10-CM | POA: Diagnosis not present

## 2013-12-24 DIAGNOSIS — Z8659 Personal history of other mental and behavioral disorders: Secondary | ICD-10-CM | POA: Diagnosis not present

## 2013-12-24 DIAGNOSIS — Z7901 Long term (current) use of anticoagulants: Secondary | ICD-10-CM | POA: Diagnosis not present

## 2013-12-24 DIAGNOSIS — F411 Generalized anxiety disorder: Secondary | ICD-10-CM | POA: Diagnosis not present

## 2013-12-24 DIAGNOSIS — Z86711 Personal history of pulmonary embolism: Secondary | ICD-10-CM | POA: Diagnosis not present

## 2013-12-24 DIAGNOSIS — Z008 Encounter for other general examination: Secondary | ICD-10-CM | POA: Diagnosis present

## 2013-12-24 DIAGNOSIS — F17200 Nicotine dependence, unspecified, uncomplicated: Secondary | ICD-10-CM | POA: Diagnosis not present

## 2013-12-24 DIAGNOSIS — F251 Schizoaffective disorder, depressive type: Secondary | ICD-10-CM

## 2013-12-24 DIAGNOSIS — F329 Major depressive disorder, single episode, unspecified: Secondary | ICD-10-CM | POA: Insufficient documentation

## 2013-12-24 LAB — SALICYLATE LEVEL

## 2013-12-24 LAB — COMPREHENSIVE METABOLIC PANEL
ALBUMIN: 3.9 g/dL (ref 3.5–5.2)
ALT: 18 U/L (ref 0–35)
AST: 31 U/L (ref 0–37)
Alkaline Phosphatase: 85 U/L (ref 39–117)
Anion gap: 20 — ABNORMAL HIGH (ref 5–15)
BUN: 20 mg/dL (ref 6–23)
CALCIUM: 9.6 mg/dL (ref 8.4–10.5)
CO2: 22 meq/L (ref 19–32)
CREATININE: 0.96 mg/dL (ref 0.50–1.10)
Chloride: 91 mEq/L — ABNORMAL LOW (ref 96–112)
GFR calc Af Amer: 82 mL/min — ABNORMAL LOW (ref >90)
GFR, EST NON AFRICAN AMERICAN: 71 mL/min — AB (ref >90)
Glucose, Bld: 122 mg/dL — ABNORMAL HIGH (ref 70–99)
Potassium: 3.4 mEq/L — ABNORMAL LOW (ref 3.7–5.3)
SODIUM: 133 meq/L — AB (ref 137–147)
TOTAL PROTEIN: 8 g/dL (ref 6.0–8.3)
Total Bilirubin: 1 mg/dL (ref 0.3–1.2)

## 2013-12-24 LAB — PROTIME-INR
INR: 1.24 (ref 0.00–1.49)
Prothrombin Time: 15.6 seconds — ABNORMAL HIGH (ref 11.6–15.2)

## 2013-12-24 LAB — ACETAMINOPHEN LEVEL: Acetaminophen (Tylenol), Serum: <15 ug/mL (ref 10–30)

## 2013-12-24 LAB — ETHANOL

## 2013-12-24 LAB — CBC
HCT: 40.5 % (ref 36.0–46.0)
Hemoglobin: 14.5 g/dL (ref 12.0–15.0)
MCH: 32.4 pg (ref 26.0–34.0)
MCHC: 35.8 g/dL (ref 30.0–36.0)
MCV: 90.4 fL (ref 78.0–100.0)
PLATELETS: 404 10*3/uL — AB (ref 150–400)
RBC: 4.48 MIL/uL (ref 3.87–5.11)
RDW: 13.4 % (ref 11.5–15.5)
WBC: 15.7 10*3/uL — ABNORMAL HIGH (ref 4.0–10.5)

## 2013-12-24 MED ORDER — BENZTROPINE MESYLATE 1 MG PO TABS
1.0000 mg | ORAL_TABLET | Freq: Every day | ORAL | Status: DC
  Administered 2013-12-24: 1 mg via ORAL
  Filled 2013-12-24: qty 1

## 2013-12-24 MED ORDER — WARFARIN SODIUM 7.5 MG PO TABS
7.5000 mg | ORAL_TABLET | Freq: Once | ORAL | Status: AC
  Administered 2013-12-24: 7.5 mg via ORAL
  Filled 2013-12-24: qty 1

## 2013-12-24 MED ORDER — RISPERIDONE 2 MG PO TABS
2.0000 mg | ORAL_TABLET | Freq: Two times a day (BID) | ORAL | Status: DC
  Administered 2013-12-24 – 2013-12-25 (×3): 2 mg via ORAL
  Filled 2013-12-24 (×3): qty 1

## 2013-12-24 MED ORDER — DIAZEPAM 5 MG PO TABS
5.0000 mg | ORAL_TABLET | Freq: Two times a day (BID) | ORAL | Status: DC | PRN
  Administered 2013-12-24 – 2013-12-25 (×2): 5 mg via ORAL
  Filled 2013-12-24 (×2): qty 1

## 2013-12-24 MED ORDER — WARFARIN - PHARMACIST DOSING INPATIENT: Freq: Every day | Status: DC

## 2013-12-24 MED ORDER — TRAZODONE HCL 50 MG PO TABS: 50.0000 mg | ORAL_TABLET | Freq: Every evening | ORAL | Status: DC | PRN
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
## 2013-12-24 NOTE — Progress Notes (Signed)
ANTICOAGULATION CONSULT NOTE - Initial Consult  Pharmacy Consult for Warfarin Indication: Hx PE  Allergies  Allergen Reactions  . Darvocet [Propoxyphene N-Acetaminophen] Nausea And Vomiting and Other (See Comments)    Shaking     Patient Measurements:    Vital Signs: Temp: 98.7 F (37.1 C) (09/24 1404) Temp src: Oral (09/24 1404) BP: 151/120 mmHg (09/24 1404) Pulse Rate: 99 (09/24 1404)  Labs:  Recent Labs  12/22/13 1550  12/22/13 1553 12/22/13 1733 12/23/13 1315 12/24/13 1413  HGB  --   < > 15.3*  --  15.7* 14.5  HCT  --   --  43.1  --  44.0 40.5  PLT  --   --  392  --  434* 404*  LABPROT >90.0*  --   --   --  16.2*  --   INR >10.00*  --   --   --  1.30  --   CREATININE  --   --   --  1.07  --  0.96  < > = values in this interval not displayed.  The CrCl is unknown because both a height and weight (above a minimum accepted value) are required for this calculation.   Medical History: Past Medical History  Diagnosis Date  . Pulmonary emboli   . Schizophrenia   . Cancer   . Uterus cancer   . Anxiety   . Depression   . PTSD (post-traumatic stress disorder)     Medications:  Scheduled:  . benztropine  1 mg Oral QHS  . risperiDONE  2 mg Oral BID   Infusions:    Assessment: 9 yoF presented to ED on 9/24 from home due to auditory and visual hallucinations, psychosis.  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  PTA warfarin dosage was 5mg  daily except 7.5mg  on Sundays, but patient is unable to tell when she last took a dose.  She recently required IV vitamin K 10mg  on 9/22 for elevated INR > 10.  Today, 9/24 INR 1.24, subtherapeutic CBC:  Hgb 14.5, Plt 404   Goal of Therapy:  INR 2-3 Monitor platelets by anticoagulation protocol: Yes   Plan:   Warfarin 7.5 mg PO tonight at 1800   Daily INR, CBC   Gretta Arab PharmD, BCPS Pager [phone] 12/24/2013 3:59 PM

## 2013-12-24 NOTE — [facility] Counselor (Signed)
Magistrate [place] confirmed receipt of IVC paperwork. Original IVC paperwork placed in [facility] IVC log. Copies placed in pt's chart.  Arnold Long, [place] Assessment Counselor

## 2013-12-24 NOTE — ED Provider Notes (Addendum)
CSN: [id number]     Arrival date & time 12/24/13  1351 History   First MD Initiated Contact with Patient 12/24/13 [phone]     Chief Complaint  Patient presents with  . Delusional     (Consider location/radiation/quality/duration/timing/severity/associated sxs/prior Treatment) HPI Comments: Patient history of schizophrenia presents with delusional behavior. She was actually released from the hospital yesterday and presented with her boyfriend back to the ED today. When I see the patient, she is pacing the room talking about [employer] and how we don't really see importance been any since. History is limited because patient is delusional. Per report, she's had multiple psychiatric admissions for similar to her. She is on Coumadin for pulmonary embolus. She was in the ED 2 days ago and had a markedly elevated INR greater than 10. She was given vitamin K and her Coumadin was held. A recheck on her INR was 1.3   Past Medical History  Diagnosis Date  . Pulmonary emboli   . Schizophrenia   . Cancer   . Uterus cancer   . Anxiety   . Depression   . PTSD (post-traumatic stress disorder)    Past Surgical History  Procedure Laterality Date  . No past surgeries    . Uterine fibroid surgery      uterine cancer  . Multiple extractions with alveoloplasty N/A 06/08/2013    Procedure: Dorma Russell;  Surgeon: Gae Bon, DDS;  Location: [facility];  Service: Oral Surgery;  Laterality: N/A;   Family History  Problem Relation Age of Onset  . Schizophrenia Neg Hx   . Cancer Other    History  Substance Use Topics  . Smoking status: Current Every Day Smoker -- 3.00 packs/day for 15 years    Types: Cigarettes  . Smokeless tobacco: Never Used  . Alcohol Use: Yes     Comment: rarely   OB History   Grav Para Term Preterm Abortions TAB SAB Ect Mult Living                 Review of Systems  Unable to perform ROS: Psychiatric disorder      Allergies   Darvocet  Home Medications   Prior to Admission medications   Medication Sig Start Date End Date Taking? Authorizing Provider  cyclobenzaprine (FLEXERIL) 10 MG tablet Take 1 tablet (10 mg total) by mouth 3 (three) times daily as needed for muscle spasms. 10/25/13  Yes Nena Polio, PA-C  diazepam (VALIUM) 5 MG tablet Take 5 mg by mouth every 8 (eight) hours as needed for anxiety (sleep). 10/25/13  Yes Nena Polio, PA-C  HYDROcodone-acetaminophen (NORCO) 10-325 MG per tablet Take 1 tablet by mouth 5 (five) times daily as needed (pain.).    Yes Historical Provider, MD  traZODone (DESYREL) 50 MG tablet Take 50 mg by mouth at bedtime.   Yes Historical Provider, MD  warfarin (COUMADIN) 5 MG tablet Take 5-7.5 mg by mouth daily. Take 5 mg once daily on Monday, Tuesday, Wednesday, Thursday, Friday, and Saturday.  Take 7.5 mg once daily on Sunday.    Historical Provider, MD   BP 107/62  Pulse 75  Temp(Src) 98 F (36.7 C) (Oral)  Resp 16  SpO2 96%  LMP 11/16/2013 Physical Exam  Constitutional: She appears well-developed and well-nourished.  HENT:  Head: Normocephalic and atraumatic.  Eyes: Pupils are equal, round, and reactive to light.  Neck: Normal range of motion. Neck supple.  Cardiovascular: Normal rate, regular rhythm and normal heart sounds.   Pulmonary/Chest:  Effort normal and breath sounds normal. No respiratory distress. She has no wheezes. She has no rales. She exhibits no tenderness.  Abdominal: Soft. Bowel sounds are normal. There is no tenderness. There is no rebound and no guarding.  Musculoskeletal: Normal range of motion. She exhibits no edema.  Lymphadenopathy:    She has no cervical adenopathy.  Neurological: She is alert.  Patient is ambulating. She's moving all extremities symmetrically. There is no facial droop or slurred speech.  Skin: Skin is warm and dry. No rash noted.  Psychiatric: Her mood appears anxious. Her affect is inappropriate. Her speech is rapid and/or  pressured and tangential. She is actively hallucinating. Thought content is paranoid and delusional.    ED Course  Procedures (including critical care time) Labs Review Results for orders placed during the hospital encounter of 12/24/13  ACETAMINOPHEN LEVEL      Result Value Ref Range   Acetaminophen (Tylenol), Serum <15.0  10 - 30 ug/mL  CBC      Result Value Ref Range   WBC 15.7 (*) 4.0 - 10.5 K/uL   RBC 4.48  3.87 - 5.11 MIL/uL   Hemoglobin 14.5  12.0 - 15.0 g/dL   HCT 40.5  36.0 - 46.0 %   MCV 90.4  78.0 - 100.0 fL   MCH 32.4  26.0 - 34.0 pg   MCHC 35.8  30.0 - 36.0 g/dL   RDW 13.4  11.5 - 15.5 %   Platelets 404 (*) 150 - 400 K/uL  COMPREHENSIVE METABOLIC PANEL      Result Value Ref Range   Sodium 133 (*) 137 - 147 mEq/L   Potassium 3.4 (*) 3.7 - 5.3 mEq/L   Chloride 91 (*) 96 - 112 mEq/L   CO2 22  19 - 32 mEq/L   Glucose, Bld 122 (*) 70 - 99 mg/dL   BUN 20  6 - 23 mg/dL   Creatinine, Ser 0.96  0.50 - 1.10 mg/dL   Calcium 9.6  8.4 - 10.5 mg/dL   Total Protein 8.0  6.0 - 8.3 g/dL   Albumin 3.9  3.5 - 5.2 g/dL   AST 31  0 - 37 U/L   ALT 18  0 - 35 U/L   Alkaline Phosphatase 85  39 - 117 U/L   Total Bilirubin 1.0  0.3 - 1.2 mg/dL   GFR calc non Af Amer 71 (*) >90 mL/min   GFR calc Af Amer 82 (*) >90 mL/min   Anion gap 20 (*) 5 - 15  ETHANOL      Result Value Ref Range   Alcohol, Ethyl (B) <11  0 - 11 mg/dL  SALICYLATE LEVEL      Result Value Ref Range   Salicylate Lvl <9.5 (*) 2.8 - 20.0 mg/dL  PROTIME-INR      Result Value Ref Range   Prothrombin Time 15.6 (*) 11.6 - 15.2 seconds   INR 1.24  0.00 - 1.49   Dg Lumbar Spine Complete  12/22/2013   CLINICAL DATA:  Pain post trauma  EXAM: LUMBAR SPINE - COMPLETE 4+ VIEW  COMPARISON:  Aug 19, 2011  FINDINGS: Frontal, lateral, spot lumbosacral lateral, and bilateral oblique views were obtained. There are 5 non-rib-bearing lumbar type vertebral bodies. There is slight lumbar levoscoliosis. There is no fracture or  spondylolisthesis. There is mild disc space narrowing at L4-5 and L5-S1. There is no appreciable facet arthropathy.  IMPRESSION: Mild scoliosis and mild disc narrowing at L4-5 and L5-S1. No fracture or spondylolisthesis.  Electronically Signed   By: Lowella Grip M.D.   On: 12/22/2013 17:11      Imaging Review No results found.   EKG Interpretation None      MDM   Final diagnoses:  Schizoaffective disorder-chronic with exacerbation    Patient is transferred to the psych ED. We will recheck her INR today. Her white blood cell count is elevated but on review of her records, it's been frequently elevated in the recent past without evidence of infection. I will go ahead and order a urinalysis. Her lungs were clear on my exam. She's afebrile.  U/a still pending.  Malvin Johns, MD 12/24/13 [place], MD 12/24/13 2251

## 2013-12-24 NOTE — ED Notes (Signed)
Pt pacing floor at intervals, redirected as needed, resting at present.  Will continue to monitor for safety.

## 2013-12-24 NOTE — Consult Note (Signed)
[facility] Face-to-Face Psychiatry Consult   Reason for Consult:  Psychotic behavior Referring Physician:  EDP  [name] is an 43 y.o. female. Total Time spent with patient: 30 minutes  Assessment: AXIS I:  Schizoaffective Disorder AXIS II:  Deferred AXIS III:   Past Medical History  Diagnosis Date  . Pulmonary emboli   . Schizophrenia   . Cancer   . Uterus cancer   . Anxiety   . Depression   . PTSD (post-traumatic stress disorder)    AXIS IV:  other psychosocial or environmental problems AXIS V:  21-30 behavior considerably influenced by delusions or hallucinations OR serious impairment in judgment, communication OR inability to function in almost all areas  Plan:  Recommend psychiatric Inpatient admission when medically cleared.  Subjective:    HPI:  [name] is a 43 y.o. female patient.  Patient was brought to [facility] by her boyfriend.  He states that patient has been acting strange "talking to her self, walking in the middle of the road at night, putting cans in the microwave; I'm afraid to leave her at home."  Patient states that she is hearing voices "The universe is stuck in the clouds and I got to untangle it; telling me to untangle the clouds; I got to pull, you know pick it out." patient making gestures like she is pulling things down.  Patient smiling/laughing responding to internal stimuli.  Patient also appears to be having visual hallucinations. The boyfriend of patient states that she got worse last night.  States that a neighbor saw her last night and knew her and took her to his mothers house.  States that he is afraid to leave her at home because he is afraid that she will hurt or self of put her self in danger if left alone.  Also states that patient refuses to take her medications or go to any outpatient services so she is constantly ending back in the hospital.  Patient denise homicidal/suicidal ideation.   Review of Systems  HENT:       Patient states that she has  throat cancer  Respiratory: Negative for cough, sputum production, shortness of breath and wheezing.        History of Pulmonary emboli   Genitourinary:       History or uterine cancer  Musculoskeletal: Negative.   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  Did not elaborate.  Psychiatric/Behavioral: Positive for hallucinations and substance abuse. Negative for depression, suicidal ideas and memory loss. The patient is not nervous/anxious and does not have insomnia.   All other systems reviewed and are negative.  Family History  Problem Relation Age of Onset  . Schizophrenia Neg Hx   . Cancer Other      HPI Elements:   Location:  psychosis. Quality:  responding to auditory/visual hallucinations. Severity:  walking in the middle of road at night. Timing:  1 day.  Past Psychiatric History: Past Medical History  Diagnosis Date  . Pulmonary emboli   . Schizophrenia   . Cancer   . Uterus cancer   . Anxiety   . Depression   . PTSD (post-traumatic stress disorder)     reports that she has been smoking Cigarettes.  She has a 45 pack-year smoking history. She has never used smokeless tobacco. She reports that she drinks alcohol. She reports that she uses illicit drugs (Marijuana). Family History  Problem Relation Age of Onset  . Schizophrenia Neg Hx   .  Cancer Other            Allergies:   Allergies  Allergen Reactions  . Darvocet [Propoxyphene N-Acetaminophen] Nausea And Vomiting and Other (See Comments)    Shaking     ACT Assessment Complete:  Yes:    Educational Status    Risk to Self: Risk to self with the past 6 months Is patient at risk for suicide?: No, but patient needs Medical Clearance  Risk to Others:    Abuse:    Prior Inpatient Therapy:    Prior Outpatient Therapy:    Additional Information:         Objective: Blood pressure 151/120, pulse 99, temperature 98.7 F (37.1 C), temperature source Oral, last menstrual  period 11/16/2013, SpO2 99.00%.There is no weight on file to calculate BMI. Results for orders placed during the hospital encounter of 12/24/13 (from the past 72 hour(s))  CBC     Status: Abnormal   Collection Time    12/24/13  2:13 PM      Result Value Ref Range   WBC 15.7 (*) 4.0 - 10.5 K/uL   RBC 4.48  3.87 - 5.11 MIL/uL   Hemoglobin 14.5  12.0 - 15.0 g/dL   HCT 40.5  36.0 - 46.0 %   MCV 90.4  78.0 - 100.0 fL   MCH 32.4  26.0 - 34.0 pg   MCHC 35.8  30.0 - 36.0 g/dL   RDW 13.4  11.5 - 15.5 %   Platelets 404 (*) 150 - 400 K/uL   Labs are reviewed positive UDS see all lab values above.  Medication reviewed no changes made; home medications started.    No current facility-administered medications for this encounter.   Current Outpatient Prescriptions  Medication Sig Dispense Refill  . HYDROcodone-acetaminophen (NORCO) 10-325 MG per tablet Take 1 tablet by mouth 5 (five) times daily as needed (pain.).       Marland Kitchen traZODone (DESYREL) 50 MG tablet Take 50 mg by mouth at bedtime.      . cyclobenzaprine (FLEXERIL) 10 MG tablet Take 1 tablet (10 mg total) by mouth 3 (three) times daily as needed for muscle spasms.  30 tablet  0  . diazepam (VALIUM) 5 MG tablet Take 5 mg by mouth every 8 (eight) hours as needed for anxiety (sleep).      . warfarin (COUMADIN) 5 MG tablet Take 5-7.5 mg by mouth daily. 5mg  daily except 7.5mg  on Sundays        Psychiatric Specialty Exam:     Blood pressure 151/120, pulse 99, temperature 98.7 F (37.1 C), temperature source Oral, last menstrual period 11/16/2013, SpO2 99.00%.There is no weight on file to calculate BMI.  General Appearance: Bizarre and Casual  Eye Contact::  Minimal  Speech:  Clear and Coherent and Normal Rate  Volume:  Normal  Mood:  Anxious  Affect:  Inappropriate and Labile  Thought Process:  Disorganized  Orientation:  Full (Time, Place, and Person)  Thought Content:  Hallucinations: Auditory Visual  Suicidal Thoughts:  No  Homicidal  Thoughts:  No  Memory:  Immediate;   Poor Recent;   Poor Remote;   Poor  Judgement:  Impaired  Insight:  Lacking  Psychomotor Activity:  Normal  Concentration:  Poor  Recall:  Poor  Fund of Knowledge:Fair  Language: Fair  Akathisia:  No  Handed:  Right  AIMS (if indicated):     Assets:  Communication Skills Desire for Improvement Housing Social Support  Sleep:  Musculoskeletal: Strength & Muscle Tone: within normal limits Gait & Station: normal Patient leans: N/A  Treatment Plan Summary: Daily contact with patient to assess and evaluate symptoms and progress in treatment Medication management Inpatient treatment recommended.        Earleen Newport, FNP-BC 12/24/2013 2:44 PM

## 2013-12-25 ENCOUNTER — Encounter (HOSPITAL_COMMUNITY): Payer: Self-pay | Admitting: Psychiatry

## 2013-12-25 DIAGNOSIS — F259 Schizoaffective disorder, unspecified: Secondary | ICD-10-CM

## 2013-12-25 LAB — PROTIME-INR
INR: 1.3 (ref 0.00–1.49)
PROTHROMBIN TIME: 16.2 s — AB (ref 11.6–15.2)

## 2013-12-25 MED ORDER — WARFARIN SODIUM 7.5 MG PO TABS
7.5000 mg | ORAL_TABLET | Freq: Once | ORAL | Status: DC
  Filled 2013-12-25: qty 1

## 2013-12-25 NOTE — Progress Notes (Signed)
ANTICOAGULATION CONSULT NOTE - Follow Up  Pharmacy Consult for Warfarin Indication: Hx PE  Allergies  Allergen Reactions  . Darvocet [Propoxyphene N-Acetaminophen] Nausea And Vomiting and Other (See Comments)    Shaking     Patient Measurements:    Vital Signs: Temp: 97.5 F (36.4 C) (09/25 0612) Temp src: Oral (09/25 0612) BP: 107/76 mmHg (09/25 0612) Pulse Rate: 113 (09/25 0612)  Labs:  Recent Labs  12/22/13 1553 12/22/13 1733 12/23/13 1315 12/24/13 1413 12/25/13 0515  HGB 15.3*  --  15.7* 14.5  --   HCT 43.1  --  44.0 40.5  --   PLT 392  --  434* 404*  --   LABPROT  --   --  16.2* 15.6* 16.2*  INR  --   --  1.30 1.24 1.30  CREATININE  --  1.07  --  0.96  --     The CrCl is unknown because both a height and weight (above a minimum accepted value) are required for this calculation.   Medical History: Past Medical History  Diagnosis Date  . Pulmonary emboli   . Schizophrenia   . Cancer   . Uterus cancer   . Anxiety   . Depression   . PTSD (post-traumatic stress disorder)     Medications:  Scheduled:  . benztropine  1 mg Oral QHS  . risperiDONE  2 mg Oral BID  . Warfarin - Pharmacist Dosing Inpatient   Does not apply q1800   Infusions:    Assessment: 67 yoF presented to ED on 9/24 from home due to auditory and visual hallucinations, psychosis.  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  PTA warfarin dosage was 5mg  daily except 7.5mg  on Sundays, but patient is unable to tell when she last took a dose.  She recently required IV vitamin K 10mg  on 9/22 for elevated INR > 10.  Today, 9/5 INR 1.30, subtherapeutic  CBC:  Hgb 14.5, Plt 404 on 9/24 No reported bleeding  Warfarin Inpatient Doses Given: 9/24 7.5mg    Goal of Therapy:  INR 2-3 Monitor platelets by anticoagulation protocol: Yes   Plan:   Repeat Warfarin 7.5mg  today  Daily INR, CBC  If patient discharged would recommend to resume home dose as stated above after 7.5mg  today and recheck INR  on Monday   Adrian Saran, PharmD, BCPS Pager [phone] 12/25/2013 9:36 AM

## 2013-12-25 NOTE — [facility] Suicide Risk Assessment (Signed)
Suicide Risk Assessment  Discharge Assessment     Demographic Factors:  NA  Total Time spent with patient: 30 minutes  Psychiatric Specialty Exam:     Blood pressure 107/76, pulse 113, temperature 97.5 F (36.4 C), temperature source Oral, resp. rate 20, last menstrual period 11/16/2013, SpO2 95.00%.There is no weight on file to calculate BMI.  General Appearance: Casual  Eye Contact::  Good  Speech:  Normal Rate  Volume:  Normal  Mood:  Euthymic  Affect:  Congruent  Thought Process:  Coherent  Orientation:  Full (Time, Place, and Person)  Thought Content:  WDL  Suicidal Thoughts:  No  Homicidal Thoughts:  No  Memory:  Immediate;   Good Recent;   Good Remote;   Good  Judgement:  Good  Insight:  Good  Psychomotor Activity:  Normal  Concentration:  Good  Recall:  Good  Fund of Knowledge:Fair  Language: Good  Akathisia:  No  Handed:  Right  AIMS (if indicated):     Assets:  [employer] logistics/support/administrative officer Housing Leisure Time Resilience Social Support Transportation  Sleep:      Musculoskeletal: Strength & Muscle Tone: within normal limits Gait & Station: normal Patient leans: N/A  Mental Status Per Nursing Assessment::   On Admission:   Medical issues  Current Mental Status by Physician: NA  Loss Factors: NA  Historical Factors: NA  Risk Reduction Factors:   Sense of responsibility to family, Living with another person, especially a relative, Positive social support, Positive therapeutic relationship and Positive coping skills or problem solving skills  Continued Clinical Symptoms:  None  Cognitive Features That Contribute To Risk:  None   Suicide Risk:  Minimal: No identifiable suicidal ideation.  Patients presenting with no risk factors but with morbid ruminations; may be classified as minimal risk based on the severity of the depressive symptoms  Discharge Diagnoses:   AXIS I:  Schizoaffective Disorder AXIS II:  Deferred AXIS III:   Past Medical  History  Diagnosis Date  . Pulmonary emboli   . Schizophrenia   . Cancer   . Uterus cancer   . Anxiety   . Depression   . PTSD (post-traumatic stress disorder)    AXIS IV:  chronic mental illness AXIS V:  61-70 mild symptoms  Plan Of Care/Follow-up recommendations:  Activity:  as tolerated Diet:  low-sodium heart healthy diet  Is patient on multiple antipsychotic therapies at discharge:  No   Has Patient had three or more failed trials of antipsychotic monotherapy by history:  No  Recommended Plan for Multiple Antipsychotic Therapies: NA    LORD, JAMISON, [place] 12/25/2013, 11:51 AM

## 2013-12-25 NOTE — BH Assessment (Signed)
Per Mendel Ryder on Adult unit 39 female [facility] bed should be available late in AM as a discharge is pending.   Lear Ng, [facility] Triage Specialist 12/25/2013 3:49 AM

## 2013-12-25 NOTE — ED Provider Notes (Signed)
Review of medical record reveals patient in psych ed and presented with subtherapeutic inr on coumadin for prior pe.  Patient with pharmacy consult. Discussed with Larkin Ina in pharmacy.  They will continue to dose and adjust coumadin.  They will make recommendations regarding dose of coumadin when patient ready for psych facility.  Patient appears hemodynamically stable.   Shaune Pollack, MD 12/25/13 [phone]

## 2013-12-25 NOTE — Consult Note (Signed)
[facility] Face-to-Face Psychiatry Consult   Reason for Consult:  Schizophrenia Referring Physician:  EDP  [name] is an 43 y.o. female. Total Time spent with patient: 20 minutes  Assessment: AXIS I:  Schizoaffective Disorder AXIS II:  Deferred AXIS III:   Past Medical History  Diagnosis Date  . Pulmonary emboli   . Schizophrenia   . Cancer   . Uterus cancer   . Anxiety   . Depression   . PTSD (post-traumatic stress disorder)    AXIS IV:  chronic mental illness AXIS V:  61-70 mild symptoms  Plan:  No evidence of imminent risk to self or others at present.  Dr. Darleene Cleaver assessed the patient and concurs with the plan.  Subjective:   [name] is a 43 y.o. female patient does not warrant admission.  HPI:  Patient denies suicidal/homicidal ideations, hallucinations, and alcohol/drug abuse.  [name] states she came to the ED for medical reasons and did not have any psychiatric needs.  Dr. Darleene Cleaver has worked with [name] many times in the past and states it is the best he has seen her. HPI Elements:   N/A  Past Psychiatric History: Past Medical History  Diagnosis Date  . Pulmonary emboli   . Schizophrenia   . Cancer   . Uterus cancer   . Anxiety   . Depression   . PTSD (post-traumatic stress disorder)     reports that she has been smoking Cigarettes.  She has a 45 pack-year smoking history. She has never used smokeless tobacco. She reports that she drinks alcohol. She reports that she uses illicit drugs (Marijuana). Family History  Problem Relation Age of Onset  . Schizophrenia Neg Hx   . Cancer Other            Allergies:   Allergies  Allergen Reactions  . Darvocet [Propoxyphene N-Acetaminophen] Nausea And Vomiting and Other (See Comments)    Shaking     ACT Assessment Complete:  Yes:    Educational Status    Risk to Self: Risk to self with the past 6 months Is patient at risk for suicide?: No, but patient needs Medical Clearance Substance abuse history and/or  treatment for substance abuse?: No  Risk to Others:    Abuse:    Prior Inpatient Therapy:    Prior Outpatient Therapy:    Additional Information:                    Objective: Blood pressure 107/76, pulse 113, temperature 97.5 F (36.4 C), temperature source Oral, resp. rate 20, last menstrual period 11/16/2013, SpO2 95.00%.There is no weight on file to calculate BMI. Results for orders placed during the hospital encounter of 12/24/13 (from the past 72 hour(s))  ACETAMINOPHEN LEVEL     Status: None   Collection Time    12/24/13  2:13 PM      Result Value Ref Range   Acetaminophen (Tylenol), Serum <15.0  10 - 30 ug/mL   Comment:            THERAPEUTIC CONCENTRATIONS VARY     SIGNIFICANTLY. A RANGE OF 10-30     ug/mL MAY BE AN EFFECTIVE     CONCENTRATION FOR MANY PATIENTS.     HOWEVER, SOME ARE BEST TREATED     AT CONCENTRATIONS OUTSIDE THIS     RANGE.     ACETAMINOPHEN CONCENTRATIONS     >150 ug/mL AT 4 HOURS AFTER     INGESTION AND >50 ug/mL AT 12  HOURS AFTER INGESTION ARE     OFTEN ASSOCIATED WITH TOXIC     REACTIONS.  CBC     Status: Abnormal   Collection Time    12/24/13  2:13 PM      Result Value Ref Range   WBC 15.7 (*) 4.0 - 10.5 K/uL   RBC 4.48  3.87 - 5.11 MIL/uL   Hemoglobin 14.5  12.0 - 15.0 g/dL   HCT 40.5  36.0 - 46.0 %   MCV 90.4  78.0 - 100.0 fL   MCH 32.4  26.0 - 34.0 pg   MCHC 35.8  30.0 - 36.0 g/dL   RDW 13.4  11.5 - 15.5 %   Platelets 404 (*) 150 - 400 K/uL  COMPREHENSIVE METABOLIC PANEL     Status: Abnormal   Collection Time    12/24/13  2:13 PM      Result Value Ref Range   Sodium 133 (*) 137 - 147 mEq/L   Potassium 3.4 (*) 3.7 - 5.3 mEq/L   Chloride 91 (*) 96 - 112 mEq/L   CO2 22  19 - 32 mEq/L   Glucose, Bld 122 (*) 70 - 99 mg/dL   BUN 20  6 - 23 mg/dL   Creatinine, Ser 0.96  0.50 - 1.10 mg/dL   Calcium 9.6  8.4 - 10.5 mg/dL   Total Protein 8.0  6.0 - 8.3 g/dL   Albumin 3.9  3.5 - 5.2 g/dL   AST 31  0 - 37 U/L   ALT 18   0 - 35 U/L   Alkaline Phosphatase 85  39 - 117 U/L   Total Bilirubin 1.0  0.3 - 1.2 mg/dL   GFR calc non Af Amer 71 (*) >90 mL/min   GFR calc Af Amer 82 (*) >90 mL/min   Comment: (NOTE)     The eGFR has been calculated using the CKD EPI equation.     This calculation has not been validated in all clinical situations.     eGFR's persistently <90 mL/min signify possible Chronic Kidney     Disease.   Anion gap 20 (*) 5 - 15  ETHANOL     Status: None   Collection Time    12/24/13  2:13 PM      Result Value Ref Range   Alcohol, Ethyl (B) <11  0 - 11 mg/dL   Comment:            LOWEST DETECTABLE LIMIT FOR     SERUM ALCOHOL IS 11 mg/dL     FOR MEDICAL PURPOSES ONLY  SALICYLATE LEVEL     Status: Abnormal   Collection Time    12/24/13  2:13 PM      Result Value Ref Range   Salicylate Lvl <2.9 (*) 2.8 - 20.0 mg/dL  PROTIME-INR     Status: Abnormal   Collection Time    12/24/13  2:13 PM      Result Value Ref Range   Prothrombin Time 15.6 (*) 11.6 - 15.2 seconds   INR 1.24  0.00 - 1.49  PROTIME-INR     Status: Abnormal   Collection Time    12/25/13  5:15 AM      Result Value Ref Range   Prothrombin Time 16.2 (*) 11.6 - 15.2 seconds   INR 1.30  0.00 - 1.49   Labs are reviewed and are pertinent for no medical issues noted.  Current Facility-Administered Medications  Medication Dose Route Frequency Provider Last Rate Last Dose  .  benztropine (COGENTIN) tablet 1 mg  1 mg Oral QHS Shuvon Rankin, NP   1 mg at 12/24/13 2118  . diazepam (VALIUM) tablet 5 mg  5 mg Oral Q12H PRN Shuvon Rankin, NP   5 mg at 12/25/13 0917  . risperiDONE (RISPERDAL) tablet 2 mg  2 mg Oral BID Shuvon Rankin, NP   2 mg at 12/25/13 0917  . traZODone (DESYREL) tablet 50 mg  50 mg Oral QHS PRN Shuvon Rankin, NP      . warfarin (COUMADIN) tablet 7.5 mg  7.5 mg Oral ONCE-1800 Kara Mead, [facility]      . Warfarin - Pharmacist Dosing Inpatient   Does not apply q1800 Randa Spike, [facility]       Current  Outpatient Prescriptions  Medication Sig Dispense Refill  . cyclobenzaprine (FLEXERIL) 10 MG tablet Take 1 tablet (10 mg total) by mouth 3 (three) times daily as needed for muscle spasms.  30 tablet  0  . diazepam (VALIUM) 5 MG tablet Take 5 mg by mouth every 8 (eight) hours as needed for anxiety (sleep).      Marland Kitchen HYDROcodone-acetaminophen (NORCO) 10-325 MG per tablet Take 1 tablet by mouth 5 (five) times daily as needed (pain.).       Marland Kitchen traZODone (DESYREL) 50 MG tablet Take 50 mg by mouth at bedtime.      Marland Kitchen warfarin (COUMADIN) 5 MG tablet Take 5-7.5 mg by mouth daily. Take 5 mg once daily on Monday, Tuesday, Wednesday, Thursday, Friday, and Saturday.  Take 7.5 mg once daily on Sunday.        Psychiatric Specialty Exam:     Blood pressure 107/76, pulse 113, temperature 97.5 F (36.4 C), temperature source Oral, resp. rate 20, last menstrual period 11/16/2013, SpO2 95.00%.There is no weight on file to calculate BMI.  General Appearance: Casual  Eye Contact::  Good  Speech:  Normal Rate  Volume:  Normal  Mood:  Euthymic  Affect:  Congruent  Thought Process:  Coherent  Orientation:  Full (Time, Place, and Person)  Thought Content:  WDL  Suicidal Thoughts:  No  Homicidal Thoughts:  No  Memory:  Immediate;   Good Recent;   Good Remote;   Good  Judgement:  Good  Insight:  Good  Psychomotor Activity:  Normal  Concentration:  Good  Recall:  Good  Fund of Knowledge:Fair  Language: Good  Akathisia:  No  Handed:  Right  AIMS (if indicated):     Assets:  Armed forces logistics/support/administrative officer Housing Leisure Time Resilience Social Support Transportation  Sleep:      Musculoskeletal: Strength & Muscle Tone: within normal limits Gait & Station: normal Patient leans: N/A  Treatment Plan Summary: Discharge home with follow-up with her regular provider.  Waylan Boga, [place] 12/25/2013 11:47 AM  Patient seen, evaluated and I agree with notes by Nurse Practitioner. Corena Pilgrim, MD

## 2013-12-25 NOTE — Progress Notes (Signed)
Patient discharged per MD orders. Patient given education regarding discharge instructions. Patient denies any questions or concerns regarding this. Patient was then given belongings and escorted by [facility] to ED lobby.

## 2013-12-28 ENCOUNTER — Encounter (HOSPITAL_COMMUNITY): Payer: Self-pay | Admitting: Emergency Medicine

## 2013-12-28 ENCOUNTER — Emergency Department (HOSPITAL_COMMUNITY)
Admission: EM | Admit: 2013-12-28 | Discharge: 2013-12-28 | Disposition: A | Discharge status: Other Acute Inpatient Hospital | Payer: Medicaid Other | Attending: Emergency Medicine | Admitting: Emergency Medicine

## 2013-12-28 ENCOUNTER — Inpatient Hospital Stay (HOSPITAL_COMMUNITY)
Admission: AD | Admit: 2013-12-28 | Discharge: 2014-01-07 | DRG: 885 | Disposition: A | Discharge status: Home / Self Care | Payer: Medicaid Other | Source: Intra-hospital | Attending: Psychiatry | Admitting: Psychiatry

## 2013-12-28 ENCOUNTER — Encounter (HOSPITAL_COMMUNITY): Payer: Self-pay | Admitting: *Deleted

## 2013-12-28 DIAGNOSIS — F17210 Nicotine dependence, cigarettes, uncomplicated: Secondary | ICD-10-CM | POA: Diagnosis present

## 2013-12-28 DIAGNOSIS — F419 Anxiety disorder, unspecified: Secondary | ICD-10-CM | POA: Diagnosis present

## 2013-12-28 DIAGNOSIS — Z7901 Long term (current) use of anticoagulants: Secondary | ICD-10-CM

## 2013-12-28 DIAGNOSIS — F1210 Cannabis abuse, uncomplicated: Secondary | ICD-10-CM | POA: Diagnosis present

## 2013-12-28 DIAGNOSIS — F329 Major depressive disorder, single episode, unspecified: Secondary | ICD-10-CM | POA: Diagnosis present

## 2013-12-28 DIAGNOSIS — Z86711 Personal history of pulmonary embolism: Secondary | ICD-10-CM | POA: Insufficient documentation

## 2013-12-28 DIAGNOSIS — F17200 Nicotine dependence, unspecified, uncomplicated: Secondary | ICD-10-CM

## 2013-12-28 DIAGNOSIS — F4310 Post-traumatic stress disorder, unspecified: Secondary | ICD-10-CM | POA: Diagnosis present

## 2013-12-28 DIAGNOSIS — G4700 Insomnia, unspecified: Secondary | ICD-10-CM | POA: Diagnosis present

## 2013-12-28 DIAGNOSIS — F259 Schizoaffective disorder, unspecified: Secondary | ICD-10-CM | POA: Insufficient documentation

## 2013-12-28 DIAGNOSIS — F411 Generalized anxiety disorder: Secondary | ICD-10-CM | POA: Diagnosis not present

## 2013-12-28 DIAGNOSIS — F1220 Cannabis dependence, uncomplicated: Secondary | ICD-10-CM

## 2013-12-28 DIAGNOSIS — F911 Conduct disorder, childhood-onset type: Secondary | ICD-10-CM | POA: Insufficient documentation

## 2013-12-28 DIAGNOSIS — Z8542 Personal history of malignant neoplasm of other parts of uterus: Secondary | ICD-10-CM

## 2013-12-28 DIAGNOSIS — F1110 Opioid abuse, uncomplicated: Secondary | ICD-10-CM | POA: Diagnosis present

## 2013-12-28 DIAGNOSIS — F1120 Opioid dependence, uncomplicated: Secondary | ICD-10-CM

## 2013-12-28 DIAGNOSIS — F251 Schizoaffective disorder, depressive type: Secondary | ICD-10-CM

## 2013-12-28 DIAGNOSIS — R609 Edema, unspecified: Secondary | ICD-10-CM | POA: Insufficient documentation

## 2013-12-28 DIAGNOSIS — F1310 Sedative, hypnotic or anxiolytic abuse, uncomplicated: Secondary | ICD-10-CM | POA: Diagnosis present

## 2013-12-28 DIAGNOSIS — R000 Tachycardia, unspecified: Secondary | ICD-10-CM | POA: Diagnosis not present

## 2013-12-28 DIAGNOSIS — Z046 Encounter for general psychiatric examination, requested by authority: Secondary | ICD-10-CM | POA: Insufficient documentation

## 2013-12-28 DIAGNOSIS — Z79899 Other long term (current) drug therapy: Secondary | ICD-10-CM | POA: Insufficient documentation

## 2013-12-28 DIAGNOSIS — F201 Disorganized schizophrenia: Secondary | ICD-10-CM

## 2013-12-28 DIAGNOSIS — F209 Schizophrenia, unspecified: Secondary | ICD-10-CM | POA: Diagnosis present

## 2013-12-28 LAB — COMPREHENSIVE METABOLIC PANEL
ALBUMIN: 4.1 g/dL (ref 3.5–5.2)
ALT: 20 U/L (ref 0–35)
AST: 29 U/L (ref 0–37)
Alkaline Phosphatase: 88 U/L (ref 39–117)
Anion gap: 19 — ABNORMAL HIGH (ref 5–15)
BUN: 11 mg/dL (ref 6–23)
CALCIUM: 9.3 mg/dL (ref 8.4–10.5)
CO2: 23 mEq/L (ref 19–32)
CREATININE: 0.98 mg/dL (ref 0.50–1.10)
Chloride: 93 mEq/L — ABNORMAL LOW (ref 96–112)
GFR calc non Af Amer: 69 mL/min — ABNORMAL LOW (ref >90)
GFR, EST AFRICAN AMERICAN: 80 mL/min — AB (ref >90)
Glucose, Bld: 116 mg/dL — ABNORMAL HIGH (ref 70–99)
POTASSIUM: 3.1 meq/L — AB (ref 3.7–5.3)
Sodium: 135 mEq/L — ABNORMAL LOW (ref 137–147)
TOTAL PROTEIN: 7.9 g/dL (ref 6.0–8.3)
Total Bilirubin: 0.9 mg/dL (ref 0.3–1.2)

## 2013-12-28 LAB — CBC
HEMATOCRIT: 39.2 % (ref 36.0–46.0)
Hemoglobin: 13.8 g/dL (ref 12.0–15.0)
MCH: 32.5 pg (ref 26.0–34.0)
MCHC: 35.2 g/dL (ref 30.0–36.0)
MCV: 92.5 fL (ref 78.0–100.0)
Platelets: 467 10*3/uL — ABNORMAL HIGH (ref 150–400)
RBC: 4.24 MIL/uL (ref 3.87–5.11)
RDW: 13.6 % (ref 11.5–15.5)
WBC: 16.8 10*3/uL — ABNORMAL HIGH (ref 4.0–10.5)

## 2013-12-28 LAB — ETHANOL

## 2013-12-28 LAB — ACETAMINOPHEN LEVEL: Acetaminophen (Tylenol), Serum: <15 ug/mL (ref 10–30)

## 2013-12-28 LAB — SALICYLATE LEVEL

## 2013-12-28 MED ORDER — ZOLPIDEM TARTRATE 5 MG PO TABS: 5.0000 mg | ORAL_TABLET | Freq: Every evening | ORAL | Status: DC | PRN

## 2013-12-28 MED ORDER — NICOTINE 21 MG/24HR TD PT24: 21.0000 mg | MEDICATED_PATCH | Freq: Every day | TRANSDERMAL | Status: DC

## 2013-12-28 MED ORDER — LORAZEPAM 1 MG PO TABS: 1.0000 mg | ORAL_TABLET | Freq: Three times a day (TID) | ORAL | Status: DC | PRN

## 2013-12-28 MED ORDER — TRAZODONE HCL 50 MG PO TABS: 50.0000 mg | ORAL_TABLET | Freq: Every evening | ORAL | Status: DC | PRN

## 2013-12-28 MED ORDER — POTASSIUM CHLORIDE CRYS ER 20 MEQ PO TBCR
40.0000 meq | EXTENDED_RELEASE_TABLET | Freq: Once | ORAL | Status: AC
  Administered 2013-12-28: 40 meq via ORAL
  Filled 2013-12-28: qty 2

## 2013-12-28 MED ORDER — ALUM & MAG HYDROXIDE-SIMETH 200-200-20 MG/5ML PO SUSP: 30.0000 mL | ORAL | Status: DC | PRN

## 2013-12-28 MED ORDER — RISPERIDONE 2 MG PO TABS
2.0000 mg | ORAL_TABLET | Freq: Two times a day (BID) | ORAL | Status: DC
  Filled 2013-12-28 (×4): qty 1

## 2013-12-28 MED ORDER — BENZTROPINE MESYLATE 1 MG PO TABS: 1.0000 mg | ORAL_TABLET | Freq: Every day | ORAL | Status: DC

## 2013-12-28 MED ORDER — RISPERIDONE 2 MG PO TABS
2.0000 mg | ORAL_TABLET | Freq: Two times a day (BID) | ORAL | Status: DC
  Filled 2013-12-28: qty 1

## 2013-12-28 MED ORDER — DIAZEPAM 5 MG PO TABS
5.0000 mg | ORAL_TABLET | Freq: Three times a day (TID) | ORAL | Status: DC | PRN
  Administered 2013-12-29: 5 mg via ORAL
  Filled 2013-12-28: qty 1

## 2013-12-28 MED ORDER — TRAZODONE HCL 100 MG PO TABS: 100.0000 mg | ORAL_TABLET | Freq: Every day | ORAL | Status: DC

## 2013-12-28 MED ORDER — MAGNESIUM HYDROXIDE 400 MG/5ML PO SUSP: 30.0000 mL | Freq: Every day | ORAL | Status: DC | PRN

## 2013-12-28 MED ORDER — IBUPROFEN 200 MG PO TABS: 600.0000 mg | ORAL_TABLET | Freq: Three times a day (TID) | ORAL | Status: DC | PRN

## 2013-12-28 MED ORDER — WARFARIN - PHYSICIAN DOSING INPATIENT
Freq: Every day | Status: DC
  Filled 2013-12-28 (×9): qty 1

## 2013-12-28 MED ORDER — WARFARIN SODIUM 5 MG PO TABS: 5.0000 mg | ORAL_TABLET | Freq: Every day | ORAL | Status: DC

## 2013-12-28 MED ORDER — POTASSIUM CHLORIDE CRYS ER 20 MEQ PO TBCR
20.0000 meq | EXTENDED_RELEASE_TABLET | Freq: Two times a day (BID) | ORAL | Status: AC
  Administered 2013-12-29 – 2013-12-30 (×4): 20 meq via ORAL
  Filled 2013-12-28 (×6): qty 1

## 2013-12-28 MED ORDER — CYCLOBENZAPRINE HCL 10 MG PO TABS
10.0000 mg | ORAL_TABLET | Freq: Three times a day (TID) | ORAL | Status: DC | PRN
  Administered 2013-12-29 – 2014-01-06 (×9): 10 mg via ORAL
  Filled 2013-12-28: qty 1
  Filled 2013-12-28: qty 30
  Filled 2013-12-28 (×8): qty 1

## 2013-12-28 MED ORDER — LORATADINE 10 MG PO TABS: 10.0000 mg | ORAL_TABLET | Freq: Every day | ORAL | Status: DC

## 2013-12-28 MED ORDER — BENZTROPINE MESYLATE 1 MG PO TABS
1.0000 mg | ORAL_TABLET | Freq: Every day | ORAL | Status: DC
  Administered 2013-12-29 – 2014-01-03 (×5): 1 mg via ORAL
  Filled 2013-12-28 (×9): qty 1

## 2013-12-28 MED ORDER — WARFARIN SODIUM 7.5 MG PO TABS: 7.5000 mg | ORAL_TABLET | ORAL | Status: DC

## 2013-12-28 MED ORDER — CYCLOBENZAPRINE HCL 10 MG PO TABS: 10.0000 mg | ORAL_TABLET | Freq: Three times a day (TID) | ORAL | Status: DC | PRN

## 2013-12-28 MED ORDER — ONDANSETRON HCL 4 MG PO TABS: 4.0000 mg | ORAL_TABLET | Freq: Three times a day (TID) | ORAL | Status: DC | PRN

## 2013-12-28 NOTE — ED Notes (Signed)
Patient can not give urine sample ay this time

## 2013-12-28 NOTE — ED Notes (Signed)
Patient has on burgundy scrubs and yellow socks.  Patient has been wanded by security.  Patient has one personal belonging bag with a dress and a pair of shoes.

## 2013-12-28 NOTE — Progress Notes (Signed)
ANTICOAGULATION CONSULT NOTE - Initial Consult  Pharmacy Consult for Warfarin Indication: Hx PE  Allergies  Allergen Reactions  . Darvocet [Propoxyphene N-Acetaminophen] Nausea And Vomiting and Other (See Comments)    Shaking     Patient Measurements:   Last reported weight ~ 57 kg (12/22/13)  Vital Signs: Temp: 98.5 F (36.9 C) (09/28 1808) Temp src: Oral (09/28 1808) BP: 133/95 mmHg (09/28 1531) Pulse Rate: 114 (09/28 1531)  Labs:  Recent Labs  12/28/13 1711  HGB 13.8  HCT 39.2  PLT 467*  CREATININE 0.98    The CrCl is unknown because both a height and weight (above a minimum accepted value) are required for this calculation.   Medical History: Past Medical History  Diagnosis Date  . Pulmonary emboli   . Schizophrenia   . Cancer   . Uterus cancer   . Anxiety   . Depression   . PTSD (post-traumatic stress disorder)     Medications:  Scheduled:  . benztropine  1 mg Oral QHS  . [START ON 12/29/2013] loratadine  10 mg Oral Daily  . nicotine  21 mg Transdermal Daily  . risperiDONE  2 mg Oral BID  . traZODone  100 mg Oral QHS   Infusions:    Assessment: 63 yoF brought to [facility] ED on 1/19 via police under IVC for hallucinations, agitation, refusing to take medications.  PMH includes chronic warfarin anticoagulation for history of PE.  She is known to pharmacy from previous warfarin dosing on 9/24 - 9/25.  She reports previously taking 5mg  daily except 7.5mg  on Sundays; last dose reported to be 9/27, but unsure if pt is taking d/t psychosis.  She required Vitamin K for INR > 10 on 9/22, but since then has had sub-therapeutic INR.  Pharmacy is consulted to dose warfarin inpatient.   INR pending. Pt is refusing all lab work.  CBC: Hgb 13.8 and Plt 467  No major drug-drug interactions identified.  Diet: regular   Goal of Therapy:  INR 2-3 Monitor platelets by anticoagulation protocol: Yes   Plan:   No Warfarin ordered for tonight.  Pt is refusing INR  testing and refusing to take any medications.  Follow up daily.  Daily INR  Gretta Arab PharmD, BCPS Pager [phone] 12/28/2013 6:51 PM

## 2013-12-28 NOTE — ED Notes (Signed)
Report called to RN Maudie Mercury at [facility].

## 2013-12-28 NOTE — ED Notes (Signed)
Pt refused blood draw and Risperdal med.  RN Maudie Mercury at [facility] notified.

## 2013-12-28 NOTE — ED Notes (Signed)
Per IVC papers states she was recently released from [facility]-not taking meds, adgitated

## 2013-12-28 NOTE — ED Provider Notes (Signed)
CSN: [id number]     Arrival date & time 12/28/13  1530 History   First MD Initiated Contact with Patient 12/28/13 1711     Chief Complaint  Patient presents with  . ivc      (Consider location/radiation/quality/duration/timing/severity/associated sxs/prior Treatment) HPI Comments: Patient is a 43 year old female with a past smoker history of schizophrenia, anxiety, depression, PTSD brought to the emergency department by police under IVC from "boyfriend" with hallucinations and increased agitation. Patient was found walking down the street, found by law enforcement, she was hostile and belligerent, she was yelling at the sheriff's deputies and showing extreme defiance. She is talking to spirits, refusing to take medications, and is a harm to herself and others. Patient is stating she is part of the medications and her country needs her. Level V caveat due to psychosis.  The history is provided by the police.    Past Medical History  Diagnosis Date  . Pulmonary emboli   . Schizophrenia   . Anxiety   . Depression   . PTSD (post-traumatic stress disorder)   . Cancer     pt denies ever having Any cancer  . Uterus cancer    Past Surgical History  Procedure Laterality Date  . No past surgeries    . Uterine fibroid surgery      uterine cancer  . Multiple extractions with alveoloplasty N/A 06/08/2013    Procedure: Dorma Russell;  Surgeon: Gae Bon, DDS;  Location: [place];  Service: Oral Surgery;  Laterality: N/A;   Family History  Problem Relation Age of Onset  . Schizophrenia Neg Hx   . Cancer Other    History  Substance Use Topics  . Smoking status: Current Every Day Smoker -- 2.50 packs/day for 15 years    Types: Cigarettes  . Smokeless tobacco: Never Used  . Alcohol Use: Yes     Comment: once montly   OB History   Grav Para Term Preterm Abortions TAB SAB Ect Mult Living                 Review of Systems  Unable to perform ROS: Psychiatric  disorder      Allergies  Darvocet  Home Medications   Prior to Admission medications   Medication Sig Start Date End Date Taking? Authorizing Provider  cyclobenzaprine (FLEXERIL) 10 MG tablet Take 1 tablet (10 mg total) by mouth 3 (three) times daily as needed for muscle spasms. 10/25/13  Yes Nena Polio, PA-C  diazepam (VALIUM) 5 MG tablet Take 5 mg by mouth every 8 (eight) hours as needed for anxiety (sleep). 10/25/13  Yes Nena Polio, PA-C  warfarin (COUMADIN) 5 MG tablet Take 5-7.5 mg by mouth daily. Take 5 mg once daily on Monday, Tuesday, Wednesday, Thursday, Friday, and Saturday.  Take 7.5 mg once daily on Sunday.   Yes Historical Provider, MD   BP 112/81  Pulse 112  Temp(Src) 98.1 F (36.7 C) (Oral)  Resp 20  SpO2 97%  LMP 11/16/2013 Physical Exam  Nursing note and vitals reviewed. Constitutional: She is oriented to person, place, and time. She appears well-developed and well-nourished. No distress.  HENT:  Head: Normocephalic and atraumatic.  Eyes: Conjunctivae are normal.  Neck: Normal range of motion. Neck supple.  Cardiovascular: Regular rhythm and normal heart sounds.   Tachycardic.  Pulmonary/Chest: Effort normal and breath sounds normal.  Musculoskeletal: She exhibits edema.  Neurological: She is alert and oriented to person, place, and time.  Skin: She is not diaphoretic.  Psychiatric: Her affect is angry. She is agitated and actively hallucinating.    ED Course  Procedures (including critical care time) Labs Review Labs Reviewed  CBC - Abnormal; Notable for the following:    WBC 16.8 (*)    Platelets 467 (*)    All other components within normal limits  COMPREHENSIVE METABOLIC PANEL - Abnormal; Notable for the following:    Sodium 135 (*)    Potassium 3.1 (*)    Chloride 93 (*)    Glucose, Bld 116 (*)    GFR calc non Af Amer 69 (*)    GFR calc Af Amer 80 (*)    Anion gap 19 (*)    All other components within normal limits  SALICYLATE LEVEL -  Abnormal; Notable for the following:    Salicylate Lvl <8.6 (*)    All other components within normal limits  ACETAMINOPHEN LEVEL  ETHANOL  URINE RAPID DRUG SCREEN (HOSP PERFORMED)  PROTIME-INR    Imaging Review No results found.   EKG Interpretation None      MDM   Final diagnoses:  Schizoaffective disorder-chronic with exacerbation  Post traumatic stress disorder (PTSD)   Patient under IVC, psych history, psychotic. Potassium replaced PO. Medically cleared. Accepted at [facility].  Illene Labrador, PA-C 12/29/13 [phone]

## 2013-12-28 NOTE — ED Notes (Signed)
Pt refused 1800 vitals

## 2013-12-28 NOTE — ED Notes (Signed)
Pt. To SAPPU from ED ambulatory without difficulty. Pt. Is alert, warm and dry in no distress. Pt. Denies SI, HI, and AVH. Pt. Encouraged to let Nursing staff know if he has concerns or needs.

## 2013-12-28 NOTE — ED Notes (Signed)
[employer] transport requested. 

## 2013-12-29 DIAGNOSIS — F1120 Opioid dependence, uncomplicated: Secondary | ICD-10-CM

## 2013-12-29 DIAGNOSIS — F4310 Post-traumatic stress disorder, unspecified: Secondary | ICD-10-CM

## 2013-12-29 DIAGNOSIS — F1220 Cannabis dependence, uncomplicated: Secondary | ICD-10-CM

## 2013-12-29 DIAGNOSIS — F201 Disorganized schizophrenia: Secondary | ICD-10-CM

## 2013-12-29 DIAGNOSIS — F17200 Nicotine dependence, unspecified, uncomplicated: Secondary | ICD-10-CM

## 2013-12-29 DIAGNOSIS — F1310 Sedative, hypnotic or anxiolytic abuse, uncomplicated: Secondary | ICD-10-CM

## 2013-12-29 LAB — CBC
HCT: 37 % (ref 36.0–46.0)
Hemoglobin: 12.8 g/dL (ref 12.0–15.0)
MCH: 31.9 pg (ref 26.0–34.0)
MCHC: 34.6 g/dL (ref 30.0–36.0)
MCV: 92.3 fL (ref 78.0–100.0)
Platelets: 399 K/uL (ref 150–400)
RBC: 4.01 MIL/uL (ref 3.87–5.11)
RDW: 13.6 % (ref 11.5–15.5)
WBC: 10.2 K/uL (ref 4.0–10.5)

## 2013-12-29 LAB — HEMOGLOBIN A1C
Hgb A1c MFr Bld: 5.7 % — ABNORMAL HIGH (ref <5.7)
Mean Plasma Glucose: 117 mg/dL — ABNORMAL HIGH (ref <117)

## 2013-12-29 LAB — LIPID PANEL
CHOLESTEROL: 155 mg/dL (ref 0–200)
HDL: 39 mg/dL — ABNORMAL LOW (ref >39)
LDL Cholesterol: 102 mg/dL — ABNORMAL HIGH (ref 0–99)
Total CHOL/HDL Ratio: 4 RATIO
Triglycerides: 72 mg/dL (ref <150)
VLDL: 14 mg/dL (ref 0–40)

## 2013-12-29 LAB — PROTIME-INR
INR: 1.19 (ref 0.00–1.49)
PROTHROMBIN TIME: 15.1 s (ref 11.6–15.2)

## 2013-12-29 LAB — TSH: TSH: 0.615 u[IU]/mL (ref 0.350–4.500)

## 2013-12-29 MED ORDER — OLANZAPINE 10 MG IM SOLR
5.0000 mg | Freq: Every day | INTRAMUSCULAR | Status: DC
  Filled 2013-12-29 (×2): qty 10

## 2013-12-29 MED ORDER — OLANZAPINE 5 MG PO TBDP
5.0000 mg | ORAL_TABLET | Freq: Every day | ORAL | Status: DC
  Administered 2013-12-29: 5 mg via ORAL
  Filled 2013-12-29 (×3): qty 1

## 2013-12-29 MED ORDER — LORAZEPAM 2 MG/ML IJ SOLN
2.0000 mg | Freq: Three times a day (TID) | INTRAMUSCULAR | Status: DC | PRN
Start: 2013-12-29 — End: 2014-01-07
  Administered 2013-12-29: 2 mg via INTRAMUSCULAR

## 2013-12-29 MED ORDER — WARFARIN SODIUM 5 MG PO TABS
5.0000 mg | ORAL_TABLET | Freq: Once | ORAL | Status: DC
  Filled 2013-12-29: qty 1

## 2013-12-29 MED ORDER — LORAZEPAM 1 MG PO TABS: 2.0000 mg | ORAL_TABLET | Freq: Three times a day (TID) | ORAL | Status: DC | PRN

## 2013-12-29 MED ORDER — OLANZAPINE 10 MG PO TBDP
10.0000 mg | ORAL_TABLET | Freq: Two times a day (BID) | ORAL | Status: DC | PRN
  Filled 2013-12-29: qty 1

## 2013-12-29 MED ORDER — HALOPERIDOL 5 MG PO TABS: 10.0000 mg | ORAL_TABLET | Freq: Three times a day (TID) | ORAL | Status: DC | PRN

## 2013-12-29 MED ORDER — DIPHENHYDRAMINE HCL 50 MG/ML IJ SOLN
INTRAMUSCULAR | Status: AC
  Administered 2013-12-29: 50 mg via INTRAMUSCULAR
  Filled 2013-12-29: qty 1

## 2013-12-29 MED ORDER — LORAZEPAM 2 MG/ML IJ SOLN
INTRAMUSCULAR | Status: AC
  Administered 2013-12-29: 2 mg via INTRAMUSCULAR
  Filled 2013-12-29: qty 1

## 2013-12-29 MED ORDER — OLANZAPINE 5 MG PO TBDP
5.0000 mg | ORAL_TABLET | Freq: Every evening | ORAL | Status: DC
  Filled 2013-12-29 (×2): qty 1

## 2013-12-29 MED ORDER — HALOPERIDOL LACTATE 5 MG/ML IJ SOLN
10.0000 mg | Freq: Three times a day (TID) | INTRAMUSCULAR | Status: DC | PRN
  Administered 2013-12-29: 10 mg via INTRAMUSCULAR

## 2013-12-29 MED ORDER — OLANZAPINE 10 MG IM SOLR
10.0000 mg | Freq: Two times a day (BID) | INTRAMUSCULAR | Status: DC | PRN
  Filled 2013-12-29: qty 10

## 2013-12-29 MED ORDER — HALOPERIDOL LACTATE 5 MG/ML IJ SOLN
INTRAMUSCULAR | Status: AC
  Administered 2013-12-29: 10 mg via INTRAMUSCULAR
  Filled 2013-12-29: qty 2

## 2013-12-29 MED ORDER — WARFARIN - PHYSICIAN DOSING INPATIENT
Freq: Every day | Status: DC
  Filled 2013-12-29 (×9): qty 1

## 2013-12-29 MED ORDER — DIAZEPAM 5 MG PO TABS
5.0000 mg | ORAL_TABLET | Freq: Every day | ORAL | Status: DC | PRN
  Administered 2013-12-30 – 2014-01-03 (×5): 5 mg via ORAL
  Filled 2013-12-29 (×5): qty 1

## 2013-12-29 MED ORDER — WARFARIN SODIUM 5 MG PO TABS
5.0000 mg | ORAL_TABLET | Freq: Once | ORAL | Status: AC
  Administered 2013-12-29: 5 mg via ORAL
  Filled 2013-12-29: qty 1

## 2013-12-29 MED ORDER — TRAZODONE HCL 50 MG PO TABS
50.0000 mg | ORAL_TABLET | Freq: Every evening | ORAL | Status: DC | PRN
  Administered 2013-12-29 – 2014-01-04 (×10): 50 mg via ORAL
  Filled 2013-12-29 (×19): qty 1

## 2013-12-29 MED ORDER — DIPHENHYDRAMINE HCL 25 MG PO CAPS: 50.0000 mg | ORAL_CAPSULE | Freq: Three times a day (TID) | ORAL | Status: DC | PRN

## 2013-12-29 MED ORDER — DIPHENHYDRAMINE HCL 50 MG/ML IJ SOLN
50.0000 mg | Freq: Three times a day (TID) | INTRAMUSCULAR | Status: DC | PRN
  Administered 2013-12-29: 50 mg via INTRAMUSCULAR

## 2013-12-29 MED ORDER — NICOTINE 21 MG/24HR TD PT24
21.0000 mg | MEDICATED_PATCH | Freq: Every day | TRANSDERMAL | Status: DC
  Administered 2013-12-29 – 2014-01-07 (×10): 21 mg via TRANSDERMAL
  Filled 2013-12-29 (×12): qty 1

## 2013-12-29 NOTE — Progress Notes (Signed)
Patient ID: [name], female   DOB: 04-17-1970, 43 y.o.   MRN: [id number]  Pt was agitated and anxious during the adm process. Pt answered questions abruptly by saying "yeah, yeah, yeah", before the writer had a chance to finish asking the question. Writer observed pt actively hallucinating arguing with someone that's not present. Pt doesn't believe she should be at [facility]. Pt stated, "Lavell Luster keeps sending me here doing the same shit." Pt stated she's been "she's been with him a decade, but won't be with him no damn more". ED reported that the pt refused her blood draw and also refused risperdal. Pt was given prn valium as requested along with trazodone. Pt denied SI, HI, and A/V.

## 2013-12-29 NOTE — Progress Notes (Signed)
Pt in room, appears to be calming down, no complaints at this time, pt currently drinking [employer], pt remains safe, no apparent distress, pt remains safe.

## 2013-12-29 NOTE — [facility] Suicide Risk Assessment (Signed)
   Nursing information obtained from:    Demographic factors:    Current Mental Status:    Loss Factors:    Historical Factors:    Risk Reduction Factors:    Total Time spent with patient: 20 minutes  CLINICAL FACTORS:   Currently Psychotic Previous Psychiatric Diagnoses and Treatments Medical Diagnoses and Treatments/Surgeries  Psychiatric Specialty Exam: Physical Exam  Nursing note and vitals reviewed. Unable to do physical exam ,since patient refused  Review of Systems  Unable to perform ROS: acuity of condition    Blood pressure 97/57, pulse 113, temperature 98.3 F (36.8 C), temperature source Oral, resp. rate 18, height 5' 4.5" (1.638 m), weight 63.504 kg (140 lb), last menstrual period 11/16/2013.Body mass index is 23.67 kg/(m^2).                                              Please see H&P for mental status exam.      Musculoskeletal: Strength & Muscle Tone: within normal limits Gait & Station: normal Patient leans: N/A  COGNITIVE FEATURES THAT CONTRIBUTE TO RISK:  Closed-mindedness Polarized thinking Thought constriction (tunnel vision)    SUICIDE RISK:   Moderate:  Frequent suicidal ideation with limited intensity, and duration, some specificity in terms of plans, no associated intent, good self-control, limited dysphoria/symptomatology, some risk factors present, and identifiable protective factors, including available and accessible social support.  PLAN OF CARE:  I certify that inpatient services furnished can reasonably be expected to improve the patient's condition.  Zackrey Dyar MD 12/29/2013, 12:50 PM

## 2013-12-29 NOTE — Progress Notes (Signed)
The focus of this group is to help patients review their daily goal of treatment and discuss progress on daily workbooks. Pt did not attend group despite staff informing her that group was about to begin.

## 2013-12-29 NOTE — Progress Notes (Signed)
ANTICOAGULATION CONSULT NOTE - Follow Up Consult  Pharmacy Consult for Coumadin  Indication: pulmonary embolus and DVT  Allergies  Allergen Reactions  . Acetaminophen   . Darvocet [Propoxyphene N-Acetaminophen] Nausea And Vomiting and Other (See Comments)    Shaking     Patient Measurements: Height: 5' 4.5" (163.8 cm) Weight: 140 lb (63.504 kg) IBW/kg (Calculated) : 55.85   Vital Signs: Temp: 98.3 F (36.8 C) (09/29 0900) Temp src: Oral (09/29 0900) BP: 97/57 mmHg (09/29 0901) Pulse Rate: 113 (09/29 0901)  Labs:  Recent Labs  12/28/13 1711 12/29/13 0635  HGB 13.8 12.8  HCT 39.2 37.0  PLT 467* 399  LABPROT  --  15.1  INR  --  1.19  CREATININE 0.98  --     Estimated Creatinine Clearance: 64.6 ml/min (by C-G formula based on Cr of 0.98).   Medications:  Scheduled:  . benztropine  1 mg Oral QHS  . nicotine  21 mg Transdermal Daily  . OLANZapine zydis  5 mg Oral QPM  . potassium chloride  20 mEq Oral BID  . traZODone  50 mg Oral QHS,MR X 1  . warfarin  5 mg Oral ONCE-1800    Assessment: INR below goal at 1.18 today.  No problems noted with therapy  Goal of Therapy:  INR 2-3     Plan:  Coumadin 5 mg x1 today  Will get PT/INR in am   Lenox Ponds 12/29/2013,12:15 PM

## 2013-12-29 NOTE — Tx Team (Signed)
  Interdisciplinary Treatment Plan Update   Date Reviewed:  12/29/2013  Time Reviewed:  1:30 PM  Progress in Treatment:   Attending groups: No Participating in groups: No Taking medication as prescribed: Yes  Tolerating medication: Yes Family/Significant other contact made: No  Patient understands diagnosis: No  Limited insight  Discussing patient identified problems/goals with staff: Yes  See initial care plan Medical problems stabilized or resolved: Yes Denies suicidal/homicidal ideation: Yes  In tx team Patient has not harmed self or others: Yes  For review of initial/current patient goals, please see plan of care.  Estimated Length of Stay:  4-5 daysw  Reason for Continuation of Hospitalization: Delusions  Hallucinations Medication stabilization  New Problems/Goals identified:  N/A  Discharge Plan or Barriers:   return home, follow up outpt  Additional Comments:  Patient denies suicidal/homicidal ideations, hallucinations, and alcohol/drug abuse. Mayre states she came to the ED for medical reasons and did not have any psychiatric needs. Dr. Darleene Cleaver has worked with Hinton Dyer many times in the past and states it is the best he has seen her.  This note is from 9/25 in ED, after which pt was d/ced.  She returned on 9/28, and the following observation was made: Patient is a 43 year old female with a past  history of schizophrenia, anxiety, depression, PTSD brought to the emergency department by police under IVC from "boyfriend" with hallucinations and increased agitation. Patient was found walking down the street, found by law enforcement, she was hostile and belligerent, she was yelling at the sheriff's deputies and showing extreme defiance. She is talking to spirits, refusing to take medications, and is a harm to herself and others. Patient is stating she is part of the medications and her country needs her    Attendees:  Signature: Steva Colder, MD 12/29/2013 1:30 PM   Signature: Ripley Fraise, [place] 12/29/2013 1:30 PM  Signature: Elmarie Shiley, NP 12/29/2013 1:30 PM  Signature: Mayra Neer, RN 12/29/2013 1:30 PM  Signature: Darrol Angel, RN 12/29/2013 1:30 PM  Signature:  12/29/2013 1:30 PM  Signature:   12/29/2013 1:30 PM  Signature:    Signature:    Signature:    Signature:    Signature:    Signature:      Scribe for Treatment Team:   Ripley Fraise, LCSW  12/29/2013 1:30 PM

## 2013-12-29 NOTE — Consult Note (Signed)
Face to face evaluation and I agree with this note 

## 2013-12-29 NOTE — Progress Notes (Signed)
Pt refused am Risperdal, states "I don't take Risperdal, that should be on my chart, I don't never take Risperdal", pt is becoming increasingly agitated, pacing hallways, responding to internal stimuli, appears to be casting spells on staff as she walks by them, MD notified during treatment team meeting, no new orders given at this time.

## 2013-12-29 NOTE — Progress Notes (Signed)
Pt is agitated, yelling at staff, pacing very fast up and down hallway, glaring at staff, clinching fists and making gestures towards staff as if she is casting spells on them, MD notified, new orders given, pt refused PO stating "I know what they are doing to the medicines back there", Charge RN/AD/Security notified, Pt compliant with IM medication without hands on, See MAR, pt in room pacing, no distress at this time.

## 2013-12-29 NOTE — Progress Notes (Signed)
Unable to preform EKG on this shift d/t pt's psychotic behavior and paranoia.  Pt refused scheduled dose of PO Zyprexa this evening, did comply with taking Potassium and Coumadin, pt very suspicious that "they are doing something to the medicine through the package", MD Eappen Notified, no new orders given.

## 2013-12-29 NOTE — Tx Team (Signed)
Initial Interdisciplinary Treatment Plan   PATIENT STRESSORS: Marital or family conflict Medication change or noncompliance Substance abuse   PROBLEM LIST: Problem List/Patient Goals Date to be addressed Date deferred Reason deferred Estimated date of resolution  "Not a damn thing I don't need to even be here" 12/28/13           Altered mental status  12/28/13     Increased risk of suicide 12/28/13                                    DISCHARGE CRITERIA:  Ability to meet basic life and health needs Adequate post-discharge living arrangements Improved stabilization in mood, thinking, and/or behavior Medical problems require only outpatient monitoring Motivation to continue treatment in a less acute level of care Need for constant or close observation no longer present Safe-care adequate arrangements made Verbal commitment to aftercare and medication compliance  PRELIMINARY DISCHARGE PLAN: Outpatient therapy Participate in family therapy Placement in alternative living arrangements  PATIENT/FAMIILY INVOLVEMENT: This treatment plan has been presented to and reviewed with the patient, Lu Paradise, and/or family member.  The patient and family have been given the opportunity to ask questions and make suggestions.  Wynonia Hazard Laverne 12/29/2013, 12:12 AM

## 2013-12-29 NOTE — Progress Notes (Signed)
D: Pt denies SI/HI/AVH. Pt is pleasant and cooperative. Pt continues to be paranoid, suspicious, and forwards little. Pt denies AVH. But appears to be responding to internal stimuli. Pt can be seen talking to someone that can't be seen by writer on the unit when pt observed alone.    A: Pt was offered support and encouragement.  Pt was encourage to attend groups. Q 15 minute checks were done for safety.   R:  Pt receptive to treatment and safety maintained on unit.

## 2013-12-29 NOTE — Progress Notes (Signed)
Adult Psychoeducational Group Note  Date:  12/29/2013 Time:  0900 am  Group Topic/Focus:  Orientation:   The focus of this group is to educate the patient on the purpose and policies of crisis stabilization and provide a format to answer questions about their admission.  The group details unit policies and expectations of patients while admitted.  Participation Level:  Did Not Attend  Wynn Banker 12/29/2013, 12:23 PM
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
## 2013-12-30 LAB — BASIC METABOLIC PANEL
ANION GAP: 12 (ref 5–15)
BUN: 13 mg/dL (ref 6–23)
CALCIUM: 9.3 mg/dL (ref 8.4–10.5)
CO2: 27 meq/L (ref 19–32)
CREATININE: 0.95 mg/dL (ref 0.50–1.10)
Chloride: 96 mEq/L (ref 96–112)
GFR, EST AFRICAN AMERICAN: 83 mL/min — AB (ref >90)
GFR, EST NON AFRICAN AMERICAN: 72 mL/min — AB (ref >90)
Glucose, Bld: 82 mg/dL (ref 70–99)
Potassium: 3.4 mEq/L — ABNORMAL LOW (ref 3.7–5.3)
SODIUM: 135 meq/L — AB (ref 137–147)

## 2013-12-30 MED ORDER — OLANZAPINE 5 MG PO TBDP
5.0000 mg | ORAL_TABLET | Freq: Once | ORAL | Status: AC
  Administered 2013-12-30: 5 mg via ORAL
  Filled 2013-12-30 (×2): qty 1

## 2013-12-30 MED ORDER — WARFARIN SODIUM 5 MG PO TABS
5.0000 mg | ORAL_TABLET | Freq: Once | ORAL | Status: AC
  Administered 2013-12-30: 5 mg via ORAL
  Filled 2013-12-30: qty 1

## 2013-12-30 MED ORDER — BUPROPION HCL 75 MG PO TABS
75.0000 mg | ORAL_TABLET | Freq: Every day | ORAL | Status: DC
  Filled 2013-12-30 (×4): qty 1

## 2013-12-30 MED ORDER — OLANZAPINE 10 MG IM SOLR
5.0000 mg | Freq: Two times a day (BID) | INTRAMUSCULAR | Status: DC
  Filled 2013-12-30 (×4): qty 10

## 2013-12-30 MED ORDER — WARFARIN - PHARMACIST DOSING INPATIENT
Freq: Every day | Status: DC
  Administered 2013-12-31: 17:00:00
  Filled 2013-12-30 (×17): qty 1

## 2013-12-30 MED ORDER — OLANZAPINE 5 MG PO TBDP
5.0000 mg | ORAL_TABLET | Freq: Two times a day (BID) | ORAL | Status: DC
  Administered 2013-12-30: 5 mg via ORAL
  Filled 2013-12-30 (×4): qty 1

## 2013-12-30 NOTE — [facility] Group Notes (Signed)
[facility] LCSW Group Therapy  12/30/2013 1:44 PM  Type of Therapy:  Group Therapy  Participation Level: Invited. Did Not Attend   Hyatt,Candace 12/30/2013, 1:44 PM
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
## 2013-12-30 NOTE — Progress Notes (Signed)
NUTRITION ASSESSMENT  Pt identified as at risk on the Malnutrition Screen Tool  INTERVENTION: 1. Educated patient on the importance of nutrition and encouraged intake of food and beverages. 2. Discussed weight goals. 3. Supplements: none at this time.  NUTRITION DIAGNOSIS: Unintentional weight loss related to sub-optimal intake as evidenced by pt report.   Goal: Pt to meet >/= 90% of their estimated nutrition needs.  Monitor:  PO intake  Assessment:  Patient admitted with schizophrenia.  Known to this RD from last admit in July.  Patient weighed 156 lbs at that time.  She states that she ate well prior to admit with good intake currently as well.  Stated that she was trying to lose weight with a goal of 135 lbs.  States she was exercising, "nibbling throughout the day.   43 y.o. female  Height: Ht Readings from Last 1 Encounters:  12/28/13 5' 4.5" (1.638 m)    Weight: Wt Readings from Last 1 Encounters:  12/28/13 140 lb (63.504 kg)    Weight Hx: Wt Readings from Last 10 Encounters:  12/28/13 140 lb (63.504 kg)  12/22/13 125 lb (56.7 kg)  10/19/13 156 lb (70.761 kg)  10/11/13 153 lb 14.1 oz (69.8 kg)  06/02/13 162 lb (73.483 kg)  01/27/13 150 lb (68.04 kg)  01/25/13 153 lb 14.1 oz (69.8 kg)  01/06/13 158 lb (71.668 kg)  12/18/12 158 lb (71.668 kg)  10/10/11 173 lb (78.472 kg)    BMI:  Body mass index is 23.67 kg/(m^2). Pt meets criteria for normal weight  based on current BMI.  Estimated Nutritional Needs: Kcal: 25-30 kcal/kg Protein: > 1 gram protein/kg Fluid: 1 ml/kcal  Diet Order: General Pt is also offered choice of unit snacks mid-morning and mid-afternoon.  Pt is eating as desired.   Lab results and medications reviewed.   Antonieta Iba, RD, LDN Clinical Inpatient Dietitian Pager:  [phone] Weekend and after hours pager:  [phone]

## 2013-12-30 NOTE — Progress Notes (Signed)
Patient ID: [name], female   DOB: [birth year], 43 y.o.   MRN: [id number]  D: Pt. Denies SI/HI and A/V Hallucinations however patient is responding to internal stimuli. Patient is speaking to someone who is not visible to staff, pacing, and flailing her arms around however it is not directed at anyone in particular and patient has not made physical contact with anyone else. Patient does not report any pain or discomfort during medication pass however is seen holding her head later in the morning. Patient reports her head hurts however refuses any intervention, although offered. Patient appears to be minimizing her illness and rates all symptoms at 0/10.  A: Support and encouragement provided to the patient to get EKG done and take medications as prescribed. Scheduled medications are administered per physician's orders.  R: Patient is minimal and guarded with Probation officer. Patient refused to get EKG done and Probation officer and another RN encouraged patient but she continues to refuse. MD has been notified of this. Patient can be seen in the milieu at times pacing.  Q15 minute checks are maintained for safety.
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
## 2013-12-30 NOTE — Progress Notes (Signed)
ANTICOAGULATION CONSULT NOTE - Follow Up Consult  Pharmacy Consult for coumadin Indication: h/o pe  Allergies  Allergen Reactions  . Acetaminophen   . Darvocet [Propoxyphene N-Acetaminophen] Nausea And Vomiting and Other (See Comments)    Shaking     Patient Measurements: Height: 5' 4.5" (163.8 cm) Weight: 140 lb (63.504 kg) IBW/kg (Calculated) : 55.85    Labs:  Recent Labs  12/28/13 1711 12/29/13 0635 12/30/13 0621  HGB 13.8 12.8  --   HCT 39.2 37.0  --   PLT 467* 399  --   LABPROT  --  15.1  --   INR  --  1.19  --   CREATININE 0.98  --  0.95    Estimated Creatinine Clearance: 66.7 ml/min (by C-G formula based on Cr of 0.95).   Medications:   Scheduled:  . benztropine  1 mg Oral QHS  . buPROPion  75 mg Oral Daily  . nicotine  21 mg Transdermal Daily  . OLANZapine  5 mg Intramuscular BID  . OLANZapine zydis  5 mg Oral BID  . potassium chloride  20 mEq Oral BID  . traZODone  50 mg Oral QHS,MR X 1  . warfarin  5 mg Oral ONCE-1800  . Warfarin - Pharmacist Dosing Inpatient   Does not apply q1800    Assessment: No problems noted with therapy.  Below goal.  Goal of Therapy:  INR 2-3    Plan:  Coumadin 5 mg x 1 today , PT/INR in AM    Lenox Ponds 12/30/2013,2:50 PM
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
## 2013-12-30 NOTE — ED Provider Notes (Signed)
Medical screening examination/treatment/procedure(s) were conducted as a shared visit with non-physician practitioner(s) and myself.  I personally evaluated the patient during the encounter.   EKG Interpretation None      43 yo female with hx of schizophrenia presenting with agitation with IVC paperwork.  On exam, disheveled, nontoxic, not distressed, normal respiratory effort, normal perfusion, pacing hallway, then pacing her room, delusional.  Plan TTS consult.  Clinical Impression: 1. Schizoaffective disorder-chronic with exacerbation   2. Post traumatic stress disorder (PTSD)       Houston Siren III, MD 12/30/13 [id number]

## 2013-12-30 NOTE — ED Provider Notes (Signed)
Medical screening examination/treatment/procedure(s) were conducted as a shared visit with non-physician practitioner(s) and myself.  I personally evaluated the patient during the encounter.   EKG Interpretation None        Houston Siren III, MD 12/30/13 [phone]

## 2013-12-30 NOTE — Progress Notes (Signed)
Patient ID: [name], female   DOB: 11/07/1970, 43 y.o.   MRN: [id number]  Patient refuses to do EKG at this time although writer and another RN encouraged her. Patient originally stated that she would cooperate but after being brought into treatment room she stated that she did not want to do it. Patient appears to be responding to stimuli at this time, swatting at the air, whispering to someone that is not visible to Probation officer or other staff, etc.

## 2013-12-30 NOTE — Progress Notes (Signed)
The focus of this group is to help patients review their daily goal of treatment and discuss progress on daily workbooks. Pt attended the evening group but responded minimally to discussion prompts from the [place]. Pt reported having had a good day on the unit but could not specify a reason why. Pt complained of leg pain, then left the group and did not return. Pt appeared distracted in group, seemingly responding to internal stimuli with hand gestures and mouthed words.

## 2013-12-31 DIAGNOSIS — F13129 Sedative, hypnotic or anxiolytic abuse with intoxication, unspecified: Secondary | ICD-10-CM

## 2013-12-31 DIAGNOSIS — F1220 Cannabis dependence, uncomplicated: Secondary | ICD-10-CM

## 2013-12-31 LAB — URINALYSIS W MICROSCOPIC (NOT AT ARMC)
Glucose, UA: NEGATIVE mg/dL
KETONES UR: NEGATIVE mg/dL
LEUKOCYTES UA: NEGATIVE
Nitrite: NEGATIVE
PH: 5.5 (ref 5.0–8.0)
PROTEIN: NEGATIVE mg/dL
Specific Gravity, Urine: 1.022 (ref 1.005–1.030)
Urobilinogen, UA: 1 mg/dL (ref 0.0–1.0)

## 2013-12-31 LAB — PROTIME-INR
INR: 1.24 (ref 0.00–1.49)
PROTHROMBIN TIME: 15.6 s — AB (ref 11.6–15.2)

## 2013-12-31 MED ORDER — OLANZAPINE 10 MG PO TBDP
10.0000 mg | ORAL_TABLET | Freq: Two times a day (BID) | ORAL | Status: DC
  Filled 2013-12-31 (×2): qty 1

## 2013-12-31 MED ORDER — SERTRALINE HCL 25 MG PO TABS
ORAL_TABLET | ORAL | Status: AC
  Filled 2013-12-31: qty 1

## 2013-12-31 MED ORDER — OLANZAPINE 10 MG IM SOLR
10.0000 mg | Freq: Two times a day (BID) | INTRAMUSCULAR | Status: DC
  Filled 2013-12-31 (×10): qty 10

## 2013-12-31 MED ORDER — WARFARIN SODIUM 7.5 MG PO TABS
7.5000 mg | ORAL_TABLET | Freq: Once | ORAL | Status: AC
  Administered 2013-12-31: 7.5 mg via ORAL
  Filled 2013-12-31: qty 1

## 2013-12-31 MED ORDER — SERTRALINE HCL 25 MG PO TABS
25.0000 mg | ORAL_TABLET | Freq: Every day | ORAL | Status: DC
  Administered 2013-12-31 – 2014-01-04 (×5): 25 mg via ORAL
  Filled 2013-12-31 (×6): qty 1

## 2013-12-31 MED ORDER — OLANZAPINE 10 MG PO TBDP
10.0000 mg | ORAL_TABLET | Freq: Two times a day (BID) | ORAL | Status: DC
  Administered 2013-12-31 – 2014-01-04 (×9): 10 mg via ORAL
  Filled 2013-12-31 (×12): qty 1

## 2013-12-31 NOTE — Progress Notes (Signed)
Patient ID: [name], female   DOB: Aug 14, 1970, 43 y.o.   MRN: [id number] D: Patient continues to exhibit bizzare behavior.  She mimics putting spells on staff and peers.  She remains guarded and suspicious.  Patient refused her wellbutrin and zyprexa this morning.  Wellbutrin was discontinues and patient agreed to take zoloft as ordered.  She request a coke and staff gave her one.  She was very appreciative and pleasant.  She is unable to attend groups due to her psychosis, however, she stated she will make an effort today.  She denies any SI/HI/AVH.  Patient will remain a DNA due her bizzare behavior and is not appropriate for roommate. A: Continue to monitor medication management and MD orders.  Safety checks completed every 15 minutes for safety.   R: Patient is redirectable at this time.  Her behavior is appropriate to situation.  She remains cooperative and pleasant.  Continue to exhibit psychotic behavior and non compliant with medications.

## 2013-12-31 NOTE — [facility] Group Notes (Signed)
[facility] LCSW Group Therapy  12/31/2013 2:17 PM  Type of Therapy:  Group Therapy  Participation Level:  Did Not Attend-pt in room/was asked to attend group but did not come. Pt still demonstrating signs of psychosis at this time/responding to internal stimuli.  Smart, Akaash Vandewater LCSWA  12/31/2013, 2:17 PM

## 2013-12-31 NOTE — Progress Notes (Signed)
[facility] MD Progress Note  12/31/2013 1:51 PM [name]  MRN:  [id number] Subjective: Patient states," I am feeling fine." Objective: Patient seen and chart reviewed. Patient continues to be disorganized, psychotic and paranoid. Patient also appears to be periodically agitated reqiuiring redirection.She continues to be tangential and inattentive. Her thought process continues to be irrelevant.  Per staff patient has been taking medications with some encouragement. Patient is on forced medication order.  Diagnosis:   DSM5: Primary psychiatric diagnosis:  Schizophrenia,multiple episodes,currently in acute episode  R/O Unspecified delirium  Secondary psychiatric diagnosis:  PTSD per history  Opioid use disorder, moderate  Tobacco use disorder  Cannabis use disorder  Sedative hypnotic and anxiolytic disorder   Non psychiatric diagnosis:  Hx of PE on warfarin  Uterine ca per hx.           Total Time spent with patient: 30 minutes   ADL's:  Impaired  Sleep: Fair  Appetite:  Fair   Psychiatric Specialty Exam: Physical Exam  Constitutional: She appears well-developed and well-nourished.  HENT:  Head: Normocephalic and atraumatic.  Neck: Normal range of motion.  Respiratory: Effort normal.  GI: Soft.  Musculoskeletal: Normal range of motion.  Neurological:  Oriented to person and place  Psychiatric: Her mood appears anxious. Her affect is labile and inappropriate. Her speech is rapid and/or pressured and tangential. She is withdrawn and actively hallucinating. Thought content is paranoid and delusional. Cognition and memory are impaired. She expresses impulsivity and inappropriate judgment. She expresses no homicidal and no suicidal ideation.    Review of Systems  Unable to perform ROS: acuity of condition    Blood pressure 97/69, pulse 102, temperature 98.6 F (37 C), temperature source Oral, resp. rate 19, height 5' 4.5" (1.638 m), weight 63.504 kg (140 lb), last  menstrual period 11/16/2013.Body mass index is 23.67 kg/(m^2).  General Appearance: Disheveled  Eye Contact::  Minimal  Speech:  Pressured  Volume:  Normal  Mood:  Anxious and Dysphoric  Affect:  Labile  Thought Process:  Disorganized and Irrelevant  Orientation:  Other:  oriented to person ,place   Thought Content:  Hallucinations: appears to be responding to internal stimuli and Paranoid Ideation  Suicidal Thoughts:  No, however patient is too disorganized and paranoid and is a danger to self if discharged  Homicidal Thoughts:  No  Memory:  Immediate;   unable to assess Recent;   unable to assess Remote;   unable to assess  Judgement:  Poor  Insight:  Lacking  Psychomotor Activity:  Increased  Concentration:  Poor  Recall:  Poor  Fund of Knowledge:unable to assess  Language: Fair  Akathisia:  No  Handed:  unknown  AIMS (if indicated):     Assets:  Others:  access to healthcare  Sleep:  Number of Hours: 6.75   Musculoskeletal: Strength & Muscle Tone: within normal limits Gait & Station: normal Patient leans: N/A  Current Medications: Current Facility-Administered Medications  Medication Dose Route Frequency Provider Last Rate Last Dose  . alum & mag hydroxide-simeth (MAALOX/MYLANTA) 200-200-20 MG/5ML suspension 30 mL  30 mL Oral Q4H PRN Kerry Hough, PA-C      . benztropine (COGENTIN) tablet 1 mg  1 mg Oral QHS Kerry Hough, PA-C   1 mg at 12/29/13 2135  . cyclobenzaprine (FLEXERIL) tablet 10 mg  10 mg Oral TID PRN Kerry Hough, PA-C   10 mg at 12/30/13 2107  . diazepam (VALIUM) tablet 5 mg  5 mg Oral Daily PRN  Ursula Alert, MD   5 mg at 12/30/13 1207  . diphenhydrAMINE (BENADRYL) capsule 50 mg  50 mg Oral Q8H PRN Ursula Alert, MD       Or  . diphenhydrAMINE (BENADRYL) injection 50 mg  50 mg Intramuscular Q8H PRN Neosha Switalski, MD   50 mg at 12/29/13 1041  . haloperidol (HALDOL) tablet 10 mg  10 mg Oral Q8H PRN Ursula Alert, MD       Or  . haloperidol  lactate (HALDOL) injection 10 mg  10 mg Intramuscular Q8H PRN Ursula Alert, MD   10 mg at 12/29/13 1041  . LORazepam (ATIVAN) tablet 2 mg  2 mg Oral Q8H PRN Ursula Alert, MD       Or  . LORazepam (ATIVAN) injection 2 mg  2 mg Intramuscular Q8H PRN Ursula Alert, MD   2 mg at 12/29/13 1042  . magnesium hydroxide (MILK OF MAGNESIA) suspension 30 mL  30 mL Oral Daily PRN Laverle Hobby, PA-C      . nicotine (NICODERM CQ - dosed in mg/24 hours) patch 21 mg  21 mg Transdermal Daily Ursula Alert, MD   21 mg at 12/31/13 2297  . OLANZapine (ZYPREXA) injection 10 mg  10 mg Intramuscular BID Myldred Raju, MD      . OLANZapine zydis (ZYPREXA) disintegrating tablet 10 mg  10 mg Oral BID Ursula Alert, MD   10 mg at 12/31/13 1201  . sertraline (ZOLOFT) tablet 25 mg  25 mg Oral Daily Ursula Alert, MD   25 mg at 12/31/13 0834  . traZODone (DESYREL) tablet 50 mg  50 mg Oral QHS,MR X 1 Laverle Hobby, PA-C   50 mg at 12/30/13 2120  . Warfarin - Pharmacist Dosing Inpatient   Does not apply q1800 Ursula Alert, MD        Lab Results:  Results for orders placed during the hospital encounter of 12/28/13 (from the past 48 hour(s))  BASIC METABOLIC PANEL     Status: Abnormal   Collection Time    12/30/13  6:21 AM      Result Value Ref Range   Sodium 135 (*) 137 - 147 mEq/L   Potassium 3.4 (*) 3.7 - 5.3 mEq/L   Chloride 96  96 - 112 mEq/L   CO2 27  19 - 32 mEq/L   Glucose, Bld 82  70 - 99 mg/dL   BUN 13  6 - 23 mg/dL   Creatinine, Ser 0.95  0.50 - 1.10 mg/dL   Calcium 9.3  8.4 - 10.5 mg/dL   GFR calc non Af Amer 72 (*) >90 mL/min   GFR calc Af Amer 83 (*) >90 mL/min   Comment: (NOTE)     The eGFR has been calculated using the CKD EPI equation.     This calculation has not been validated in all clinical situations.     eGFR's persistently <90 mL/min signify possible Chronic Kidney     Disease.   Anion gap 12  5 - 15   Comment: Performed at [place]     Status: Abnormal   Collection Time    12/30/13  4:08 PM      Result Value Ref Range   Color, Urine AMBER (*) YELLOW   Comment: BIOCHEMICALS MAY BE AFFECTED BY COLOR   APPearance TURBID (*) CLEAR   Specific Gravity, Urine 1.022  1.005 - 1.030   pH 5.5  5.0 - 8.0   Glucose, UA  NEGATIVE  NEGATIVE mg/dL   Hgb urine dipstick SMALL (*) NEGATIVE   Bilirubin Urine SMALL (*) NEGATIVE   Ketones, ur NEGATIVE  NEGATIVE mg/dL   Protein, ur NEGATIVE  NEGATIVE mg/dL   Urobilinogen, UA 1.0  0.0 - 1.0 mg/dL   Nitrite NEGATIVE  NEGATIVE   Leukocytes, UA NEGATIVE  NEGATIVE   Urine-Other FIELD OBSCURED BY AMORPHOUS MATERIAL     Comment: Performed at [place]     Status: Abnormal   Collection Time    12/31/13  6:10 AM      Result Value Ref Range   Prothrombin Time 15.6 (*) 11.6 - 15.2 seconds   INR 1.24  0.00 - 1.49   Comment: Performed at [facility]    Physical Findings: AIMS: Facial and Oral Movements Muscles of Facial Expression: None, normal Lips and Perioral Area: None, normal Jaw: None, normal Tongue: None, normal,Extremity Movements Upper (arms, wrists, hands, fingers): None, normal Lower (legs, knees, ankles, toes): None, normal, Trunk Movements Neck, shoulders, hips: None, normal, Overall Severity Severity of abnormal movements (highest score from questions above): None, normal Incapacitation due to abnormal movements: None, normal Patient's awareness of abnormal movements (rate only patient's report): No Awareness, Dental Status Current problems with teeth and/or dentures?: No Does patient usually wear dentures?: No  CIWA:  CIWA-Ar Total: 3 COWS:     Treatment Plan Summary: Daily contact with patient to assess and evaluate symptoms and progress in treatment Medication management  Plan: Patient will benefit from inpatient treatment and stabilization.  Reviewed past medical records,treatment plan.  Will  continue to monitor vitals ,medication compliance and treatment side effects while patient is here.  Will monitor for medical issues . Patient has electrolyte abnormalities which could also add to her current symptoms. Per hospitalist not to be worried about her electrolyte abnormalities. She is also on warfarin for hx of PE.  Will increase the  Zyprexa Zydis 10 mg po bid . Will make IM zyprexa avaialble if she refuses PO ,patient is on forced medication order, Order in chart. Will start zoloft 25 mg for affective symptoms. Will make prn medications available for agitation.   Reviewed labs ,will continue to monitor. CSW will start working on disposition.  Patient to participate in therapeutic milieu .          Medical Decision Making Problem Points:  Established problem, stable/improving (1) Data Points:  Order Aims Assessment (2) Review of medication regiment & side effects (2) Review of new medications or change in dosage (2)  I certify that inpatient services furnished can reasonably be expected to improve the patient's condition.   Turner Kunzman MD 12/31/2013, 1:51 PM

## 2013-12-31 NOTE — [facility] Suicide Risk Assessment (Signed)
[facility] INPATIENT:  Family/Significant Other Suicide Prevention Education  Suicide Prevention Education:  Patient Refusal for Family/Significant Other Suicide Prevention Education: The patient [name] has refused to provide written consent for family/significant other to be provided Family/Significant Other Suicide Prevention Education during admission and/or prior to discharge.  Physician notified. CSW Intern provided suicide prevention education to the patient.   Hyatt,Lesbia Ottaway 12/31/2013, 10:13 AM

## 2013-12-31 NOTE — Progress Notes (Signed)
ANTICOAGULATION CONSULT NOTE - Follow Up Consult  Pharmacy Consult for coumadin Indication: h/o pe  Allergies  Allergen Reactions  . Acetaminophen   . Darvocet [Propoxyphene N-Acetaminophen] Nausea And Vomiting and Other (See Comments)    Shaking     Patient Measurements: Height: 5' 4.5" (163.8 cm) Weight: 140 lb (63.504 kg) IBW/kg (Calculated) : 55.85   Vital Signs: Temp: 98.6 F (37 C) (10/01 0556) Temp src: Oral (10/01 0556) BP: 97/69 mmHg (10/01 0557) Pulse Rate: 102 (10/01 0557)  Labs:  Recent Labs  12/28/13 1711 12/29/13 0635 12/30/13 0621 12/31/13 0610  HGB 13.8 12.8  --   --   HCT 39.2 37.0  --   --   PLT 467* 399  --   --   LABPROT  --  15.1  --  15.6*  INR  --  1.19  --  1.24  CREATININE 0.98  --  0.95  --     Estimated Creatinine Clearance: 66.7 ml/min (by C-G formula based on Cr of 0.95).   Medications:  Scheduled:  . benztropine  1 mg Oral QHS  . nicotine  21 mg Transdermal Daily  . OLANZapine  10 mg Intramuscular BID  . OLANZapine zydis  10 mg Oral BID  . sertraline  25 mg Oral Daily  . traZODone  50 mg Oral QHS,MR X 1  . warfarin  7.5 mg Oral ONCE-1800  . Warfarin - Pharmacist Dosing Inpatient   Does not apply q1800    Assessment: No problems noted.  INR well below goal of INR 2-3 Goal of Therapy:  INR 2-3    Plan:  Coumadin 7.5 mg x 1 today  Pt/inr in am  Lenox Ponds 12/31/2013,2:11 PM

## 2013-12-31 NOTE — Progress Notes (Signed)
Pt did not attend wrap up group this evening.  

## 2013-12-31 NOTE — Progress Notes (Signed)
Patient ID: [name], female   DOB: [birth year], 43 y.o.   MRN: [id number] D: Patient behavior is bizarre making hand gestures and picking up unknown items off the floor. Pt was holding her belly stating " with child and trying to protect her baby". Pt denies AVH but is responding to internal stimuli. Pt pacing most of the evening and c/o spasm and pain in feet. Pt denies SI/HI. Cooperative with assessment. No acute distressed noted at this time.   A: Met with pt 1:1. Medications administered as prescribed. Writer encouraged pt to discuss feelings. Pt encouraged to come to staff with any question or concerns.   R: Patient remains safe. She is complaint with medications and denies any adverse reaction. Continue current POC.

## 2013-12-31 NOTE — Progress Notes (Signed)
Patient ID: [name], female   DOB: 21-Nov-1970, 43 y.o.   MRN: [id number]  [facility] Group Notes:  (Nursing/MHT/Case Management/Adjunct)  Date:  12/31/2013  Time:  11:42 AM  Type of Therapy:  Nurse Education  Participation Level:  Did Not Attend  Participation Quality:  Did not attend  Affect:  Did not attend  Cognitive:  Did not attend  Insight:  None  Engagement in Group:  None  Modes of Intervention:  Did not attend  Summary of Progress/Problems: This group discusses leisure and lifestyle changes and how those can incorporate together to produce wellness for patients. This includes taking medication, self assessment, diet, and exercise.

## 2013-12-31 NOTE — Plan of Care (Signed)
Problem: Alteration in thought process Goal: STG-Patient does not respond to command hallucinations Outcome: Completed/Met Date Met:  12/31/13 Pt responding to internal stimuli and safe on unit

## 2014-01-01 LAB — URINE CULTURE: Colony Count: 40000

## 2014-01-01 LAB — PROTIME-INR
INR: 1.3 (ref 0.00–1.49)
Prothrombin Time: 16.4 seconds — ABNORMAL HIGH (ref 11.6–15.2)

## 2014-01-01 MED ORDER — WARFARIN SODIUM 7.5 MG PO TABS
7.5000 mg | ORAL_TABLET | Freq: Once | ORAL | Status: AC
  Administered 2014-01-01: 7.5 mg via ORAL
  Filled 2014-01-01: qty 1

## 2014-01-01 NOTE — Progress Notes (Signed)
Psychoeducational Group Note  Date:  01/01/2014 Time:  2215  Group Topic/Focus:  Wrap-Up Group:   The focus of this group is to help patients review their daily goal of treatment and discuss progress on daily workbooks.  Participation Level: Did Not Attend  Participation Quality:  Not Applicable  Affect:  Not Applicable  Cognitive:  Not Applicable  Insight:  Not Applicable  Engagement in Group: Not Applicable  Additional Comments:  The patient did not attend group this evening.   Fines Kimberlin S 01/01/2014, 10:15 PM

## 2014-01-01 NOTE — [facility] Group Notes (Signed)
[place] LCSW Group Therapy  01/01/2014 2:48 PM  Type of Therapy:  Group Therapy  Participation Level: Invited. Did Not Attend    Hyatt,Candace 01/01/2014, 2:48 PM

## 2014-01-01 NOTE — Tx Team (Signed)
  Interdisciplinary Treatment Plan Update   Date Reviewed:  01/01/2014  Time Reviewed:  11:08 AM  Progress in Treatment:   Attending groups: No Participating in groups: No Taking medication as prescribed: Yes  Tolerating medication: Yes Family/Significant other contact made: No. Pt still refusing to consent to collateral contact.   Patient understands diagnosis: No insight at this time. Pt actively psychotic, paranoid.  Discussing patient identified problems/goals with staff: Yes  See initial care plan Medical problems stabilized or resolved: Yes Denies suicidal/homicidal ideation: Yes  In tx team Patient has not harmed self or others: Yes  For review of initial/current patient goals, please see plan of care.  Estimated Length of Stay:  4-5 days  Reason for Continuation of Hospitalization: Delusions  Hallucinations Medication stabilization  New Problems/Goals identified:  N/A  Discharge Plan or Barriers:   return home, follow up outpt  Additional Comments:    10/2: Pt continues to present with paranoid ideation, mood lability, and exhibits bizarre behaviors. She is wandering halls, but not attending groups. Pt tangential and irritable during interaction. Refusing to provide information about health care providers and is continuing to refuse to allow collateral contact with family/social supports. Pt appears to be responding to internal stimuli.   Patient denies suicidal/homicidal ideations, hallucinations, and alcohol/drug abuse. Aleeah states she came to the ED for medical reasons and did not have any psychiatric needs. Dr. Darleene Cleaver has worked with Hinton Dyer many times in the past and states it is the best he has seen her.  This note is from 9/25 in ED, after which pt was d/ced.  She returned on 9/28, and the following observation was made: Patient is a 43 year old female with a past  history of schizophrenia, anxiety, depression, PTSD brought to the emergency department by police under  IVC from "boyfriend" with hallucinations and increased agitation. Patient was found walking down the street, found by law enforcement, she was hostile and belligerent, she was yelling at the sheriff's deputies and showing extreme defiance. She is talking to spirits, refusing to take medications, and is a harm to herself and others. Patient is stating she is part of the medications and her country needs her  Attendees:  Signature: Dr. Tiajuana Amass MD  01/01/2014 11:08 AM   Signature: Maxie Better, [place]  01/01/2014 11:08 AM  Signature: Bonnye Fava, [place] Intern  01/01/2014 11:08 AM  Signature: Jan RN 01/01/2014 11:08 AM  Signature: Norberto Sorenson, Care Coordintor 01/01/2014 11:08 AM  Signature:  01/01/2014 11:08 AM  Signature:   01/01/2014 11:08 AM  Signature:    Signature:    Signature:    Signature:    Signature:    Signature:      Scribe for Treatment Team:   [place], LCSWA  01/01/2014 11:08 AM

## 2014-01-01 NOTE — Progress Notes (Signed)
Patient ID: [name], female   DOB: [birth year], 43 y.o.   MRN: [id number] D: Client found in room sitting on bed rooming this evening reports "feet hurt"  Denies AVH, "not hearing no voices I'm not suppose to hear" Client preoccupied, but pleasant. A: Writer provided emotional support, encouraged client to stay off feet and elevate them to help bring comfort, as she ask for Hydrocodone for pain which  was not ordered. Assessed feet not swelling noted. Client refused Tylenol. Staff will monitor q25min for safety. R: client is safe on the unit, she did not attend group.

## 2014-01-01 NOTE — Progress Notes (Signed)
Patient ID: [name], female   DOB: 1970-04-16, 43 y.o.   MRN: [id number] [facility] MD Progress Note  01/01/2014 3:11 PM Kyrstan Gotwalt  MRN:  [id number] Subjective:  Patient reports she is upset because she is here for unfair reasons and would rather be discharged. Objective: I have discussed case with Dr. Shea Evans, who I am covering for today. Patient is a 43 year old female, with prior admissions, and history of schizophrenia. Patient remains psychotic. States she works for the [employer] in some covert job " that I am not supposed to talk about to anyone". States that she is here in hospital " because my boyfriend makes up stuff so he can put me here and go have affairs". As per chart notes, patient is disorganized in unit, pacing/wandering, with limited interaction with peers and staff. No disruptive behaviors. Needs encouragement from staff to take prescribed medications.   Diagnosis:   Schizophrenia,multiple episodes,currently in acute episode    Secondary psychiatric diagnosis:  PTSD per history  Opioid use disorder, moderate  Tobacco use disorder  Cannabis use disorder  Sedative hypnotic and anxiolytic disorder   Non psychiatric diagnosis:  Hx of PE on warfarin  Uterine ca per hx.      Total Time spent with patient: 20 minutes   ADL's: fair   Sleep: poor   Appetite:  Fair   Psychiatric Specialty Exam: Physical Exam  Constitutional: She appears well-developed and well-nourished.  HENT:  Head: Normocephalic and atraumatic.  Neck: Normal range of motion.  Respiratory: Effort normal.  GI: Soft.  Musculoskeletal: Normal range of motion.  Neurological:  Oriented to person and place  Psychiatric: Her mood appears anxious. Her affect is labile and inappropriate. Her speech is rapid and/or pressured and tangential. She is withdrawn and actively hallucinating. Thought content is paranoid and delusional. Cognition and memory are impaired. She expresses impulsivity and  inappropriate judgment. She expresses no homicidal and no suicidal ideation.    Review of Systems  Unable to perform ROS: acuity of condition    Blood pressure 110/77, pulse 115, temperature 97.3 F (36.3 C), temperature source Oral, resp. rate 16, height 5' 4.5" (1.638 m), weight 63.504 kg (140 lb), last menstrual period 11/16/2013.Body mass index is 23.67 kg/(m^2).  General Appearance: Fairly Groomed  Engineer, water::  Fair  Speech:  Normal Rate  Volume:  Normal  Mood:  Anxious and Irritable  Affect:  Labile  Thought Process:  Disorganized  Orientation:  Other:  oriented to person ,place   Thought Content:  Paranoid Ideation and delusions as described above. Denies any hallucinations and at this time does not seem actively internally preoccupied  Suicidal Thoughts: Denies any current suicidal or homicidal ideations  Homicidal Thoughts:  No  Memory:  Fair   Judgement:  Poor  Insight:  Lacking  Psychomotor Activity:  Increased  Concentration:  Poor  Recall:  Poor  Fund of Knowledge:unable to assess  Language: Fair  Akathisia:  No  Handed:  unknown  AIMS (if indicated):     Assets:  Others:  access to healthcare  Sleep:  Number of Hours: 2.5   Musculoskeletal: Strength & Muscle Tone: within normal limits Gait & Station: normal Patient leans: N/A  Current Medications: Current Facility-Administered Medications  Medication Dose Route Frequency Provider Last Rate Last Dose  . alum & mag hydroxide-simeth (MAALOX/MYLANTA) 200-200-20 MG/5ML suspension 30 mL  30 mL Oral Q4H PRN Laverle Hobby, PA-C      . benztropine (COGENTIN) tablet 1 mg  1 mg Oral QHS Laverle Hobby, PA-C   1 mg at 12/31/13 2144  . cyclobenzaprine (FLEXERIL) tablet 10 mg  10 mg Oral TID PRN Laverle Hobby, PA-C   10 mg at 01/01/14 6195  . diazepam (VALIUM) tablet 5 mg  5 mg Oral Daily PRN Ursula Alert, MD   5 mg at 12/30/13 1207  . diphenhydrAMINE (BENADRYL) capsule 50 mg  50 mg Oral Q8H PRN Ursula Alert, MD        Or  . diphenhydrAMINE (BENADRYL) injection 50 mg  50 mg Intramuscular Q8H PRN Saramma Eappen, MD   50 mg at 12/29/13 1041  . haloperidol (HALDOL) tablet 10 mg  10 mg Oral Q8H PRN Ursula Alert, MD       Or  . haloperidol lactate (HALDOL) injection 10 mg  10 mg Intramuscular Q8H PRN Ursula Alert, MD   10 mg at 12/29/13 1041  . LORazepam (ATIVAN) tablet 2 mg  2 mg Oral Q8H PRN Ursula Alert, MD       Or  . LORazepam (ATIVAN) injection 2 mg  2 mg Intramuscular Q8H PRN Ursula Alert, MD   2 mg at 12/29/13 1042  . magnesium hydroxide (MILK OF MAGNESIA) suspension 30 mL  30 mL Oral Daily PRN Laverle Hobby, PA-C      . nicotine (NICODERM CQ - dosed in mg/24 hours) patch 21 mg  21 mg Transdermal Daily Ursula Alert, MD   21 mg at 01/01/14 0932  . OLANZapine (ZYPREXA) injection 10 mg  10 mg Intramuscular BID Saramma Eappen, MD      . OLANZapine zydis (ZYPREXA) disintegrating tablet 10 mg  10 mg Oral BID Ursula Alert, MD   10 mg at 01/01/14 0809  . sertraline (ZOLOFT) tablet 25 mg  25 mg Oral Daily Ursula Alert, MD   25 mg at 01/01/14 0809  . traZODone (DESYREL) tablet 50 mg  50 mg Oral QHS,MR X 1 Spencer E Simon, PA-C   50 mg at 01/01/14 0033  . warfarin (COUMADIN) tablet 7.5 mg  7.5 mg Oral ONCE-1800 Gypsy Decant, [facility]      . Warfarin - Pharmacist Dosing Inpatient   Does not apply [id number] Ursula Alert, MD        Lab Results:  Results for orders placed during the hospital encounter of 12/28/13 (from the past 48 hour(s))  URINALYSIS W MICROSCOPIC     Status: Abnormal   Collection Time    12/30/13  4:08 PM      Result Value Ref Range   Color, Urine AMBER (*) YELLOW   Comment: BIOCHEMICALS MAY BE AFFECTED BY COLOR   APPearance TURBID (*) CLEAR   Specific Gravity, Urine 1.022  1.005 - 1.030   pH 5.5  5.0 - 8.0   Glucose, UA NEGATIVE  NEGATIVE mg/dL   Hgb urine dipstick SMALL (*) NEGATIVE   Bilirubin Urine SMALL (*) NEGATIVE   Ketones, ur NEGATIVE  NEGATIVE mg/dL    Protein, ur NEGATIVE  NEGATIVE mg/dL   Urobilinogen, UA 1.0  0.0 - 1.0 mg/dL   Nitrite NEGATIVE  NEGATIVE   Leukocytes, UA NEGATIVE  NEGATIVE   Urine-Other FIELD OBSCURED BY AMORPHOUS MATERIAL     Comment: Performed at [place]     Status: Abnormal   Collection Time    12/31/13  6:10 AM      Result Value Ref Range   Prothrombin Time 15.6 (*) 11.6 - 15.2 seconds   INR 1.24  0.00 - 1.49   Comment: Performed at [place]     Status: Abnormal   Collection Time    01/01/14  6:31 AM      Result Value Ref Range   Prothrombin Time 16.4 (*) 11.6 - 15.2 seconds   INR 1.30  0.00 - 1.49   Comment: Performed at [facility]    Physical Findings: AIMS: Facial and Oral Movements Muscles of Facial Expression: None, normal Lips and Perioral Area: None, normal Jaw: None, normal Tongue: None, normal,Extremity Movements Upper (arms, wrists, hands, fingers): None, normal Lower (legs, knees, ankles, toes): None, normal, Trunk Movements Neck, shoulders, hips: None, normal, Overall Severity Severity of abnormal movements (highest score from questions above): None, normal Incapacitation due to abnormal movements: None, normal Patient's awareness of abnormal movements (rate only patient's report): No Awareness, Dental Status Current problems with teeth and/or dentures?: Yes (upper/lower dentures) Does patient usually wear dentures?: Yes (did not bring with her)  CIWA:  CIWA-Ar Total: 3 COWS:     Assessment:  Patient remains disorganized, psychotic, and with limited insight into mental illness.  No disruptive or violent  behaviors on unit,  interactions with peers/milieu are limited . At this time no reported side effects from medications.   Treatment Plan Summary: Daily contact with patient to assess and evaluate symptoms and progress in treatment Medication management  Plan: Continue inpatient treatment and  support  She is  on warfarin for hx of PE. Zyprexa Zydis 10 mg BID  Cogentin 1 mgr QHS  Valium 5 mgrs PRN Anxiety insomnia  Zoloft 25 mgrs QDAY  PRNs as needed for agitation           Medical Decision Making Problem Points:  Established problem, worsening (2) and Review of last therapy session (1) Data Points:  Review of medication regiment & side effects (2)  I certify that inpatient services furnished can reasonably be expected to improve the patient's condition.   Neita Garnet MD 01/01/2014, 3:11 PM

## 2014-01-01 NOTE — Progress Notes (Signed)
ANTICOAGULATION CONSULT NOTE - Follow Up Consult  Pharmacy Consult for Warfarin Indication: h/o PE  Allergies  Allergen Reactions  . Acetaminophen   . Darvocet [Propoxyphene N-Acetaminophen] Nausea And Vomiting and Other (See Comments)    Shaking     Patient Measurements: Height: 5' 4.5" (163.8 cm) Weight: 140 lb (63.504 kg) IBW/kg (Calculated) : 55.85 Heparin Dosing Weight:   Vital Signs: Temp: 97.3 F (36.3 C) (10/02 0848) Temp Source: Oral (10/02 0848) BP: 110/77 mmHg (10/02 0849) Pulse Rate: 115 (10/02 0849)  Labs:  Recent Labs  12/30/13 0621 12/31/13 0610 01/01/14 0631  LABPROT  --  15.6* 16.4*  INR  --  1.24 1.30  CREATININE 0.95  --   --     Estimated Creatinine Clearance: 66.7 ml/min (by C-G formula based on Cr of 0.95).   Medications:  Scheduled:  . benztropine  1 mg Oral QHS  . nicotine  21 mg Transdermal Daily  . OLANZapine  10 mg Intramuscular BID  . OLANZapine zydis  10 mg Oral BID  . sertraline  25 mg Oral Daily  . traZODone  50 mg Oral QHS,MR X 1  . warfarin  7.5 mg Oral ONCE-1800  . Warfarin - Pharmacist Dosing Inpatient   Does not apply q1800   Infusions:   PRN: alum & mag hydroxide-simeth, cyclobenzaprine, diazepam, diphenhydrAMINE, diphenhydrAMINE, haloperidol, haloperidol lactate, LORazepam, LORazepam, magnesium hydroxide  Assessment: No problems noted. INR slightly up from yesterday, but remains below goal of INR= 2-3 Goal of Therapy:  INR 2-3    Plan:  Coumadin 7.5mg  x1 at 1800 today. PT/INR in AM.  Gypsy Decant 01/01/2014,10:01 AM

## 2014-01-01 NOTE — Progress Notes (Addendum)
Pt observed in her room grabbing at the air. She walked down the [place] and stated that hamburgers were poisoned. Offered redirection. Safety maintained on the unit.

## 2014-01-01 NOTE — Progress Notes (Signed)
D:Pt is disorganized wandering in the halls and reluctant to take her medications stating that they are poison. Pt c/o feet discomfort.  A:Offered support, encouragement and 15 minute checks. R:Pt denies si and hi. Pt took PO medications with much encouragement. Safety maintained on the unit.

## 2014-01-01 NOTE — [facility] Group Notes (Signed)
[facility] LCSW Aftercare Discharge Planning Group Note   01/01/2014 10:14 AM  Participation Quality:  DID NOT ATTEND-CSW spoke with pt after group. Pt stated that she has not seen a doctor since she's been at [facility]. CSW assured pt that she has seen and met with Dr. Shea Evans. Pt presenting as paranoid and continues to exhibit bizarre behaviors.   [employer]

## 2014-01-02 DIAGNOSIS — Z7901 Long term (current) use of anticoagulants: Secondary | ICD-10-CM

## 2014-01-02 DIAGNOSIS — F1120 Opioid dependence, uncomplicated: Secondary | ICD-10-CM

## 2014-01-02 DIAGNOSIS — F4310 Post-traumatic stress disorder, unspecified: Secondary | ICD-10-CM

## 2014-01-02 DIAGNOSIS — F1310 Sedative, hypnotic or anxiolytic abuse, uncomplicated: Secondary | ICD-10-CM

## 2014-01-02 DIAGNOSIS — F23 Brief psychotic disorder: Secondary | ICD-10-CM

## 2014-01-02 DIAGNOSIS — Z720 Tobacco use: Secondary | ICD-10-CM

## 2014-01-02 DIAGNOSIS — F1210 Cannabis abuse, uncomplicated: Secondary | ICD-10-CM

## 2014-01-02 LAB — PROTIME-INR
INR: 1.39 (ref 0.00–1.49)
Prothrombin Time: 17.2 seconds — ABNORMAL HIGH (ref 11.6–15.2)

## 2014-01-02 MED ORDER — WARFARIN SODIUM 7.5 MG PO TABS
7.5000 mg | ORAL_TABLET | Freq: Once | ORAL | Status: AC
  Administered 2014-01-02: 7.5 mg via ORAL
  Filled 2014-01-02: qty 1

## 2014-01-02 NOTE — Progress Notes (Signed)
Psychoeducational Group Note  Date:  01/02/2014 Time:  2116  Group Topic/Focus:  Wrap-Up Group:   The focus of this group is to help patients review their daily goal of treatment and discuss progress on daily workbooks.  Participation Level: Did Not Attend  Participation Quality:  Not Applicable  Affect:  Not Applicable  Cognitive:  Not Applicable  Insight:  Not Applicable  Engagement in Group: Not Applicable  Additional Comments:  The patient did not attend group this evening.  Archie Balboa S 01/02/2014, 9:16 PM

## 2014-01-02 NOTE — Progress Notes (Signed)
Patient ID: [name], female   DOB: Apr 24, 1970, 43 y.o.   MRN: [id number] Nursing shift note: D: pt has been visible in the milieu, pacing up and down the [place]. She continues to be delusional, thinking she is a Product/process development scientist. She continues to ask about discharge and does not understand why she is in the hospital. She did not come to the am groups. A: staff continues to reorient and support this patient. R: pt denies any si/hi. On her inventory sheet : slept good, appetite good, energy normal, concentration good with depression and hopelessness both at 0. She denied any anxiety and denied any physical problems. No pain. RN will monitor and Q 15 min ck's continue.

## 2014-01-02 NOTE — Progress Notes (Signed)
Patient ID: [name], female   DOB: 1970/09/14, 43 y.o.   MRN: [id number] Psychoeducational Group Note  Date:  01/02/2014 Time:  1030  Group Topic/Focus:  healthy coping skills.   Participation Level: Did Not Attend  Participation Quality:  Not Applicable  Affect:  Not Applicable  Cognitive:  Not Applicable  Insight:  Not Applicable  Engagement in Group: Not Applicable  Additional Comments:  Did not attend.   Pricilla Larsson 01/02/2014, 11:52 AM

## 2014-01-02 NOTE — Progress Notes (Signed)
Patient ID: [name], female   DOB: 1970/09/23, 43 y.o.   MRN: [id number] [facility] MD Progress Note  01/02/2014 2:41 PM [name]  MRN:  [id number] Subjective: Patient states," I don't know why they are still keeping me here, I am ready for discharge.'' Objective: Patient seen and chart reviewed. Patient has no insight into her illness. She continues to be disorganized, psychotic and paranoid. Patient also appears to be periodically agitated reqiuiring redirection.She continues to be tangential and inattentive. Her thought process continues to be irrelevant, her behavior is bizarre, she paces the [address] way back and forth talking to herself and making hand gestures which she describe as her attempts to cast out demons out of people.   Diagnosis:   DSM5: Primary psychiatric diagnosis:  Schizophrenia,multiple episodes,currently in acute episode  R/O Unspecified delirium  Secondary psychiatric diagnosis:  PTSD per history  Opioid use disorder, moderate  Tobacco use disorder  Cannabis use disorder  Sedative hypnotic and anxiolytic disorder   Non psychiatric diagnosis:  Hx of PE on warfarin  Uterine ca per hx.      Total Time spent with patient: 25 minutes   ADL's:  Impaired  Sleep: Fair  Appetite:  Fair   Psychiatric Specialty Exam: Physical Exam  Constitutional: She appears well-developed and well-nourished.  HENT:  Head: Normocephalic and atraumatic.  Neck: Normal range of motion.  Respiratory: Effort normal.  GI: Soft.  Musculoskeletal: Normal range of motion.  Neurological:  Oriented to person and place  Psychiatric: Her mood appears anxious. Her affect is labile and inappropriate. Her speech is rapid and/or pressured and tangential. She is withdrawn and actively hallucinating. Thought content is paranoid and delusional. Cognition and memory are impaired. She expresses impulsivity and inappropriate judgment. She expresses no homicidal and no suicidal ideation.    Review of  Systems  Unable to perform ROS: acuity of condition    Blood pressure 102/77, pulse 83, temperature 97.1 F (36.2 C), temperature source Oral, resp. rate 18, height 5' 4.5" (1.638 m), weight 63.504 kg (140 lb), last menstrual period 11/16/2013.Body mass index is 23.67 kg/(m^2).  General Appearance: Disheveled  Eye Contact::  Minimal  Speech:  Pressured  Volume:  Normal  Mood:  Anxious and Dysphoric  Affect:  Labile  Thought Process:  Disorganized and Irrelevant  Orientation:  Other:  oriented to person ,place   Thought Content:  Hallucinations: appears to be responding to internal stimuli and Paranoid Ideation  Suicidal Thoughts:  No, however patient is too disorganized and paranoid and is a danger to self if discharged  Homicidal Thoughts:  No  Memory:  Immediate;   unable to assess Recent;   unable to assess Remote;   unable to assess  Judgement:  Poor  Insight:  Lacking  Psychomotor Activity:  Increased  Concentration:  Poor  Recall:  Poor  Fund of Knowledge:unable to assess  Language: Fair  Akathisia:  No  Handed:  unknown  AIMS (if indicated):     Assets:  Others:  access to healthcare  Sleep:  Number of Hours: 6.25   Musculoskeletal: Strength & Muscle Tone: within normal limits Gait & Station: normal Patient leans: N/A  Current Medications: Current Facility-Administered Medications  Medication Dose Route Frequency Provider Last Rate Last Dose  . alum & mag hydroxide-simeth (MAALOX/MYLANTA) 200-200-20 MG/5ML suspension 30 mL  30 mL Oral Q4H PRN Laverle Hobby, PA-C      . benztropine (COGENTIN) tablet 1 mg  1 mg Oral QHS Laverle Hobby, PA-C  1 mg at 01/01/14 2159  . cyclobenzaprine (FLEXERIL) tablet 10 mg  10 mg Oral TID PRN Laverle Hobby, PA-C   10 mg at 01/01/14 1939  . diazepam (VALIUM) tablet 5 mg  5 mg Oral Daily PRN Ursula Alert, MD   5 mg at 01/02/14 0804  . diphenhydrAMINE (BENADRYL) capsule 50 mg  50 mg Oral Q8H PRN Ursula Alert, MD       Or  .  diphenhydrAMINE (BENADRYL) injection 50 mg  50 mg Intramuscular Q8H PRN Saramma Eappen, MD   50 mg at 12/29/13 1041  . haloperidol (HALDOL) tablet 10 mg  10 mg Oral Q8H PRN Ursula Alert, MD       Or  . haloperidol lactate (HALDOL) injection 10 mg  10 mg Intramuscular Q8H PRN Ursula Alert, MD   10 mg at 12/29/13 1041  . LORazepam (ATIVAN) tablet 2 mg  2 mg Oral Q8H PRN Ursula Alert, MD       Or  . LORazepam (ATIVAN) injection 2 mg  2 mg Intramuscular Q8H PRN Ursula Alert, MD   2 mg at 12/29/13 1042  . magnesium hydroxide (MILK OF MAGNESIA) suspension 30 mL  30 mL Oral Daily PRN Laverle Hobby, PA-C      . nicotine (NICODERM CQ - dosed in mg/24 hours) patch 21 mg  21 mg Transdermal Daily Saramma Eappen, MD   21 mg at 01/02/14 0630  . OLANZapine (ZYPREXA) injection 10 mg  10 mg Intramuscular BID Saramma Eappen, MD      . OLANZapine zydis (ZYPREXA) disintegrating tablet 10 mg  10 mg Oral BID Ursula Alert, MD   10 mg at 01/02/14 0801  . sertraline (ZOLOFT) tablet 25 mg  25 mg Oral Daily Ursula Alert, MD   25 mg at 01/02/14 0801  . traZODone (DESYREL) tablet 50 mg  50 mg Oral QHS,MR X 1 Laverle Hobby, PA-C   50 mg at 01/01/14 2159  . warfarin (COUMADIN) tablet 7.5 mg  7.5 mg Oral ONCE-1800 Ursula Alert, MD      . Warfarin - Pharmacist Dosing Inpatient   Does not apply [id number] Ursula Alert, MD        Lab Results:  Results for orders placed during the hospital encounter of 12/28/13 (from the past 48 hour(s))  PROTIME-INR     Status: Abnormal   Collection Time    01/01/14  6:31 AM      Result Value Ref Range   Prothrombin Time 16.4 (*) 11.6 - 15.2 seconds   INR 1.30  0.00 - 1.49   Comment: Performed at [facility]     Status: Abnormal   Collection Time    01/02/14  6:30 AM      Result Value Ref Range   Prothrombin Time 17.2 (*) 11.6 - 15.2 seconds   INR 1.39  0.00 - 1.49   Comment: Performed at [facility]    Physical  Findings: AIMS: Facial and Oral Movements Muscles of Facial Expression: None, normal Lips and Perioral Area: None, normal Jaw: None, normal Tongue: None, normal,Extremity Movements Upper (arms, wrists, hands, fingers): None, normal Lower (legs, knees, ankles, toes): None, normal, Trunk Movements Neck, shoulders, hips: None, normal, Overall Severity Severity of abnormal movements (highest score from questions above): None, normal Incapacitation due to abnormal movements: None, normal Patient's awareness of abnormal movements (rate only patient's report): No Awareness, Dental Status Current problems with teeth and/or dentures?: Yes (upper/lower dentures) Does patient usually  wear dentures?: Yes (did not bring with her)  CIWA:  CIWA-Ar Total: 3 COWS:     Treatment Plan Summary: Daily contact with patient to assess and evaluate symptoms and progress in treatment Medication management  Plan: Patient will benefit from inpatient treatment and stabilization.  Reviewed past medical records,treatment plan.  Will continue to monitor vitals ,medication compliance and treatment side effects while patient is here.  Will monitor for medical issues . Patient has electrolyte abnormalities which could also add to her current symptoms. Per hospitalist not to be worried about her electrolyte abnormalities. She is also on warfarin for hx of PE.  Will increase the  Zyprexa Zydis 10 mg po bid . Will make IM zyprexa avaialble if she refuses PO ,patient is on forced medication order, Order in chart. Will continue zoloft 25 mg for affective symptoms. Will make prn medications available for agitation.   Reviewed labs ,will continue to monitor. CSW will start working on disposition.  Patient to participate in therapeutic milieu .          Medical Decision Making Problem Points:  Established problem, stable/improving (1) Data Points:  Order Aims Assessment (2) Review of medication regiment & side  effects (2) Review of new medications or change in dosage (2)  I certify that inpatient services furnished can reasonably be expected to improve the patient's condition.   Corena Pilgrim MD 01/02/2014, 2:41 PM

## 2014-01-02 NOTE — Progress Notes (Signed)
ANTICOAGULATION CONSULT NOTE - Follow Up Consult  Pharmacy Consult for Couamdin Indication: H/o PE   Allergies  Allergen Reactions  . Acetaminophen   . Darvocet [Propoxyphene N-Acetaminophen] Nausea And Vomiting and Other (See Comments)    Shaking     Patient Measurements: Height: 5' 4.5" (163.8 cm) Weight: 140 lb (63.504 kg) IBW/kg (Calculated) : 55.85  Labs:  Recent Labs  12/31/13 0610 01/01/14 0631 01/02/14 0630  LABPROT 15.6* 16.4* 17.2*  INR 1.24 1.30 1.39    Estimated Creatinine Clearance: 66.7 ml/min (by C-G formula based on Cr of 0.95).   Medications:  Scheduled:  . benztropine  1 mg Oral QHS  . nicotine  21 mg Transdermal Daily  . OLANZapine  10 mg Intramuscular BID  . OLANZapine zydis  10 mg Oral BID  . sertraline  25 mg Oral Daily  . traZODone  50 mg Oral QHS,MR X 1  . Warfarin - Pharmacist Dosing Inpatient   Does not apply q1800    Assessment: INR below goal slowly increasing.  No problems noted   Goal of Therapy:  INR 2-3    Plan: Coumadin 7.5 mg x 1 today.  PT/INR in am    Lenox Ponds 01/02/2014,7:29 AM

## 2014-01-02 NOTE — Progress Notes (Signed)
Patient ID: [name], female   DOB: November 12, 1970, 43 y.o.   MRN: [id number] D: Patient pacing up [place] stating her feet hurts. Pt behavior is bizarre making hand gestures and trying to cast out demons. Pt is coherent and able to communicate needs. Pt denies AVH but is responding to internal stimuli. Pt denies SI/HI. Cooperative with assessment. No acute distressed noted at this time.   A: Met with pt 1:1. Medications administered as prescribed. Pt allowed to soak feet in tub to relieve pain. Writer encouraged pt to discuss feelings. Pt encouraged to come to staff with any question or concerns.   R: Patient remains safe. She is complaint with medications and denies any adverse reaction.

## 2014-01-02 NOTE — [facility] Group Notes (Signed)
[facility] Group Notes: (Clinical Social Work)   01/02/2014      Type of Therapy:  Group Therapy   Participation Level:  Did Not Attend - was present for the first 2 minutes, then got up and left and declined to return   [employer], LCSW 01/02/2014, 1:14 PM

## 2014-01-02 NOTE — Progress Notes (Signed)
Patient ID: [name], female   DOB: [birth year], 43 y.o.   MRN: [id number] Psychoeducational Group Note  Date:  01/02/2014 Time:  1000  Group Topic/Focus:  inventory group   Participation Level: Did Not Attend  Participation Quality:  Not Applicable  Affect:  Not Applicable  Cognitive:  Not Applicable  Insight:  Not Applicable  Engagement in Group: Not Applicable  Additional Comments:  Did not attend.   Pricilla Larsson 01/02/2014, 11:51 AM

## 2014-01-02 NOTE — Progress Notes (Signed)
D: Pt has anxious affect and mood and frequently paces in her room or in the hallway.  Pt denies SI/HI, denies hallucinations.  Pt minimally interacts with peers and staff and did not attend evening group.  Pt reports "I'm just ready to leave.  I hope I can leave tomorrow.  I don't need to be here."   A: Encouraged pt to attend evening group.  Compliance with medications encouraged and medication education provided.  Safety maintained. R: When pt initially took her 2000 medication, she spit it in the trash can and walked away.  Staff asked pt why she did this and pt stated "it tastes nasty."  With much encouragement and after pt was reminded that forced medications are ordered if she refuses to take medication by mouth, pt took a new tablet of the medication.  Pt verbally contracted for safety and agreed to notify staff of any needs or concerns.  Will continue to monitor and assess for safety.

## 2014-01-03 DIAGNOSIS — F1190 Opioid use, unspecified, uncomplicated: Secondary | ICD-10-CM

## 2014-01-03 DIAGNOSIS — F1290 Cannabis use, unspecified, uncomplicated: Secondary | ICD-10-CM

## 2014-01-03 LAB — PROTIME-INR
INR: 1.77 — AB (ref 0.00–1.49)
Prothrombin Time: 20.7 seconds — ABNORMAL HIGH (ref 11.6–15.2)

## 2014-01-03 MED ORDER — WARFARIN SODIUM 5 MG PO TABS
5.0000 mg | ORAL_TABLET | Freq: Once | ORAL | Status: AC
  Administered 2014-01-03: 5 mg via ORAL
  Filled 2014-01-03 (×2): qty 1

## 2014-01-03 NOTE — Progress Notes (Signed)
Patient ID: [name], female   DOB: Jul 04, 1970, 43 y.o.   MRN: [id number] Psychoeducational Group Note  Date:  01/03/2014 Time:  1030am  Group Topic/Focus:  Making Healthy Choices:   The focus of this group is to help patients identify negative/unhealthy choices they were using prior to admission and identify positive/healthier coping strategies to replace them upon discharge.  Participation Level:  Active  Participation Quality:  Appropriate  Affect:  Anxious  Cognitive:  Delusional  Insight:  Supportive  Engagement in Group:  Supportive  Additional Comments:  Healthy support systems.   Pricilla Larsson 01/03/2014,3:25 PM

## 2014-01-03 NOTE — [facility] Group Notes (Signed)
[facility] Group Notes: (Clinical Social Work)   01/03/2014      Type of Therapy:  Group Therapy   Participation Level:  Did Not Attend - refused, as she was pacing the hallway   Selmer Dominion, [place] 01/03/2014, 1:03 PM
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
## 2014-01-03 NOTE — Progress Notes (Signed)
D: Pt has anxious affect and mood.  Pt appears to be responding to internal stimuli.  Pt paces hallway frequently, quickly reaching her hands into the air or out in front of her at times.  Pt also mumbles to self periodically.  Pt denies SI/HI.  When asked if she is hallucinating, pt states "I'm not hearing or seeing anything I'm not supposed to."   A: Met with pt 1:1 and provided encouragement and support.  Safety maintained.  Medications administered per order.  PRN medication for insomnia and anxiety administered, see flowsheet.   R: When encouraged to attend groups, pt states "I try, but I can't sit still that long.  I have to move around."  With encouragement, pt was compliant with medications.  Pt verbally contracts for safety and is in no distress.  Will continue to monitor and assess for safety.

## 2014-01-03 NOTE — Progress Notes (Signed)
ANTICOAGULATION CONSULT NOTE - Follow Up Consult  Pharmacy Consult for Coumadin Indication: h/o pe  Allergies  Allergen Reactions  . Acetaminophen   . Darvocet [Propoxyphene N-Acetaminophen] Nausea And Vomiting and Other (See Comments)    Shaking     Patient Measurements: Height: 5' 4.5" (163.8 cm) Weight: 140 lb (63.504 kg) IBW/kg (Calculated) : 55.85   Vital Signs: Temp: 98.2 F (36.8 C) (10/04 0832) Temp Source: Oral (10/04 0832) BP: 108/74 mmHg (10/04 0833) Pulse Rate: 107 (10/04 0833)  Labs:  Recent Labs  01/01/14 0631 01/02/14 0630 01/03/14 0634  LABPROT 16.4* 17.2* 20.7*  INR 1.30 1.39 1.77*    Estimated Creatinine Clearance: 66.7 ml/min (by C-G formula based on Cr of 0.95).   Medications:  Scheduled:  . benztropine  1 mg Oral QHS  . nicotine  21 mg Transdermal Daily  . OLANZapine  10 mg Intramuscular BID  . OLANZapine zydis  10 mg Oral BID  . sertraline  25 mg Oral Daily  . traZODone  50 mg Oral QHS,MR X 1  . warfarin  5 mg Oral ONCE-1800  . Warfarin - Pharmacist Dosing Inpatient   Does not apply q1800    Assessment: INR increased to 1.77 getting closer to goal.  No problems noted  Goal of Therapy:  INR 2-3    Plan:  Coumadin 5 mg x 1 today PT/INR in am   Lenox Ponds 01/03/2014,12:50 PM

## 2014-01-03 NOTE — Plan of Care (Signed)
Problem: Ineffective individual coping Goal: STG: Patient will remain free from self harm Outcome: Progressing Pt has not harmed herself this shift.       

## 2014-01-03 NOTE — Progress Notes (Signed)
Did not attend group 
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
## 2014-01-03 NOTE — Progress Notes (Signed)
Patient ID: [name], female   DOB: [birth year], 43 y.o.   MRN: [id number] Psychoeducational Group Note  Date:  01/03/2014 Time:  1015am  Group Topic/Focus:  Making Healthy Choices:   The focus of this group is to help patients identify negative/unhealthy choices they were using prior to admission and identify positive/healthier coping strategies to replace them upon discharge.  Participation Level:  Active  Participation Quality:  Appropriate  Affect:  Anxious  Cognitive:  Delusional  Insight:  Supportive  Engagement in Group:  Supportive  Additional Comments:  Inventory group   Pricilla Larsson 01/03/2014,3:24 PM

## 2014-01-04 DIAGNOSIS — F201 Disorganized schizophrenia: Secondary | ICD-10-CM

## 2014-01-04 LAB — PROTIME-INR
INR: 1.86 — AB (ref 0.00–1.49)
Prothrombin Time: 21.6 seconds — ABNORMAL HIGH (ref 11.6–15.2)

## 2014-01-04 MED ORDER — BENZTROPINE MESYLATE 0.5 MG PO TABS
0.5000 mg | ORAL_TABLET | Freq: Two times a day (BID) | ORAL | Status: DC
  Administered 2014-01-04 – 2014-01-07 (×6): 0.5 mg via ORAL
  Filled 2014-01-04: qty 28
  Filled 2014-01-04 (×3): qty 1
  Filled 2014-01-04: qty 28
  Filled 2014-01-04 (×5): qty 1

## 2014-01-04 MED ORDER — WARFARIN SODIUM 5 MG PO TABS
5.0000 mg | ORAL_TABLET | Freq: Once | ORAL | Status: AC
  Administered 2014-01-04: 5 mg via ORAL
  Filled 2014-01-04: qty 1

## 2014-01-04 MED ORDER — HYDROXYZINE HCL 25 MG PO TABS
25.0000 mg | ORAL_TABLET | Freq: Three times a day (TID) | ORAL | Status: DC | PRN
  Filled 2014-01-04: qty 40

## 2014-01-04 MED ORDER — OLANZAPINE 5 MG PO TBDP
15.0000 mg | ORAL_TABLET | Freq: Every day | ORAL | Status: DC
  Administered 2014-01-04 – 2014-01-06 (×3): 15 mg via ORAL
  Filled 2014-01-04 (×4): qty 1

## 2014-01-04 MED ORDER — DIVALPROEX SODIUM ER 500 MG PO TB24
500.0000 mg | ORAL_TABLET | Freq: Two times a day (BID) | ORAL | Status: DC
  Administered 2014-01-04 – 2014-01-05 (×3): 500 mg via ORAL
  Filled 2014-01-04 (×6): qty 1

## 2014-01-04 MED ORDER — OLANZAPINE 10 MG IM SOLR
15.0000 mg | Freq: Every day | INTRAMUSCULAR | Status: DC
  Filled 2014-01-04 (×4): qty 20

## 2014-01-04 MED ORDER — OLANZAPINE 10 MG PO TBDP
10.0000 mg | ORAL_TABLET | Freq: Every day | ORAL | Status: DC
  Administered 2014-01-05 – 2014-01-07 (×3): 10 mg via ORAL
  Filled 2014-01-04 (×4): qty 1

## 2014-01-04 MED ORDER — SERTRALINE HCL 50 MG PO TABS: 50.0000 mg | ORAL_TABLET | Freq: Every day | ORAL | Status: DC

## 2014-01-04 MED ORDER — SERTRALINE HCL 25 MG PO TABS
25.0000 mg | ORAL_TABLET | Freq: Every day | ORAL | Status: DC
  Administered 2014-01-04 – 2014-01-06 (×3): 25 mg via ORAL
  Filled 2014-01-04 (×4): qty 1

## 2014-01-04 MED ORDER — OLANZAPINE 10 MG IM SOLR
10.0000 mg | Freq: Every day | INTRAMUSCULAR | Status: DC
  Filled 2014-01-04 (×4): qty 10

## 2014-01-04 NOTE — Plan of Care (Signed)
Problem: Diagnosis: Increased Risk For Suicide Attempt Goal: STG-Patient Will Comply With Medication Regime Outcome: Progressing Pt was compliant with all medications this shift.

## 2014-01-04 NOTE — Progress Notes (Signed)
D: Patient in the hallway on approach.  Patient states she had a good day.  Patient states she has been taking all of her medications.  Patient continues to have auditory hallucinations and responding to internal stimuli.  Patient denies SI. Patient states she took a tub bath today and she felt good. A: Staff to monitor Q 15 mins for safety.  Encouragement and support offered.  Scheduled medications administered per orders. R: Patient remains safe on the unit.  Patient attended group tonight.  Patient visible on the unit.  Patient taking administered medications.

## 2014-01-04 NOTE — Progress Notes (Signed)
Patient ID: [name], female   DOB: Dec 22, 1970, 43 y.o.   MRN: [id number] [facility] MD Progress Note  01/04/2014 2:40 PM [name]  MRN:  [id number] Subjective: Patient states,"I want to go home, I am here because my boyfriend lied on me coz he wanted me out of the way".  Objective: Patient seen. Patient appears to be restless and anxious. Patient seen pacing the hallway . Patient reports having leg cramps and reports that is why she is pacing. Patient also reports that if she paces then she can stop worrying about her boyfriend. Patient is fixed on being discharged from the hospital. Patient continues to be seen with inappropriate affect ,smiling to self and talking to self. She denies SI/HI/AVH. Diagnosis:    DSM5: Primary psychiatric diagnosis:  Schizophrenia,multiple episodes,currently in acute episode   Secondary psychiatric diagnosis:  PTSD per history  Opioid use disorder, moderate  Tobacco use disorder  Cannabis use disorder  Sedative hypnotic and anxiolytic disorder   Non psychiatric diagnosis:  Hx of PE on warfarin  Uterine ca per hx.      Total Time spent with patient: 25 minutes   ADL's:  Impaired  Sleep: Fair  Appetite:  Fair   Psychiatric Specialty Exam: Physical Exam  Constitutional: She appears well-developed and well-nourished.  HENT:  Head: Normocephalic and atraumatic.  Neck: Normal range of motion.  Respiratory: Effort normal.  GI: Soft.  Musculoskeletal: Normal range of motion.  Neurological:  Oriented to person and place  Psychiatric: Her mood appears anxious. Her affect is labile and inappropriate. Her speech is rapid and/or pressured. She is withdrawn and actively hallucinating (seen smiling to self and talking to self.). Thought content is paranoid and delusional. Cognition and memory are impaired. She expresses impulsivity and inappropriate judgment. She expresses no homicidal and no suicidal ideation.    Review of Systems  Unable to perform  ROS: acuity of condition  Psychiatric/Behavioral: Positive for hallucinations (seen smiling to self ,talking to self). Negative for suicidal ideas. The patient is nervous/anxious.     Blood pressure 106/80, pulse 119, temperature 97.5 F (36.4 C), temperature source Oral, resp. rate 20, height 5' 4.5" (1.638 m), weight 63.504 kg (140 lb), last menstrual period 11/16/2013.Body mass index is 23.67 kg/(m^2).  General Appearance: Disheveled  Eye Contact::  Minimal  Speech:  Pressured improving  Volume:  Normal  Mood:  Anxious and Dysphoric seen pacing the hallway  Affect:  Labile  Thought Process:  Disorganized and Irrelevant  Orientation:  Other:  oriented to person ,place   Thought Content:  Hallucinations: appears to be responding to internal stimuli and Paranoid Ideation  Suicidal Thoughts:  No,  Homicidal Thoughts:  No  Memory:  Immediate;   Fair Recent;   Fair Remote;   Fair  Judgement:  Poor  Insight:  Lacking  Psychomotor Activity:  Increased  Concentration:  Poor  Recall:  Poor  Fund of Knowledge:unable to assess  Language: Fair  Akathisia:  No  Handed:  unknown  AIMS (if indicated):     Assets:  Others:  access to healthcare  Sleep:  Number of Hours: 5.75   Musculoskeletal: Strength & Muscle Tone: within normal limits Gait & Station: normal Patient leans: N/A  Current Medications: Current Facility-Administered Medications  Medication Dose Route Frequency Provider Last Rate Last Dose  . alum & mag hydroxide-simeth (MAALOX/MYLANTA) 200-200-20 MG/5ML suspension 30 mL  30 mL Oral Q4H PRN Laverle Hobby, PA-C      . benztropine (COGENTIN) tablet 1  mg  1 mg Oral QHS Laverle Hobby, PA-C   1 mg at 01/03/14 2159  . cyclobenzaprine (FLEXERIL) tablet 10 mg  10 mg Oral TID PRN Laverle Hobby, PA-C   10 mg at 01/01/14 1939  . diazepam (VALIUM) tablet 5 mg  5 mg Oral Daily PRN Ursula Alert, MD   5 mg at 01/03/14 2159  . diphenhydrAMINE (BENADRYL) capsule 50 mg  50 mg Oral  Q8H PRN Ursula Alert, MD       Or  . diphenhydrAMINE (BENADRYL) injection 50 mg  50 mg Intramuscular Q8H PRN Ursula Alert, MD   50 mg at 12/29/13 1041  . divalproex (DEPAKOTE ER) 24 hr tablet 500 mg  500 mg Oral BID Ursula Alert, MD      . haloperidol (HALDOL) tablet 10 mg  10 mg Oral Q8H PRN Ursula Alert, MD       Or  . haloperidol lactate (HALDOL) injection 10 mg  10 mg Intramuscular Q8H PRN Ursula Alert, MD   10 mg at 12/29/13 1041  . LORazepam (ATIVAN) tablet 2 mg  2 mg Oral Q8H PRN Ursula Alert, MD       Or  . LORazepam (ATIVAN) injection 2 mg  2 mg Intramuscular Q8H PRN Ursula Alert, MD   2 mg at 12/29/13 1042  . magnesium hydroxide (MILK OF MAGNESIA) suspension 30 mL  30 mL Oral Daily PRN Laverle Hobby, PA-C      . nicotine (NICODERM CQ - dosed in mg/24 hours) patch 21 mg  21 mg Transdermal Daily Ursula Alert, MD   21 mg at 01/04/14 0843  . [START ON 01/05/2014] OLANZapine (ZYPREXA) injection 10 mg  10 mg Intramuscular Daily Johnni Wunschel, MD      . OLANZapine (ZYPREXA) injection 15 mg  15 mg Intramuscular QHS Kerwin Augustus, MD      . Derrill Memo ON 01/05/2014] OLANZapine zydis (ZYPREXA) disintegrating tablet 10 mg  10 mg Oral Daily Autumn Pruitt, MD      . OLANZapine zydis (ZYPREXA) disintegrating tablet 15 mg  15 mg Oral QHS Oasis Goehring, MD      . sertraline (ZOLOFT) tablet 25 mg  25 mg Oral Daily Nicolena Schurman, MD      . traZODone (DESYREL) tablet 50 mg  50 mg Oral QHS,MR X 1 Spencer E Simon, PA-C   50 mg at 01/03/14 2325  . warfarin (COUMADIN) tablet 5 mg  5 mg Oral ONCE-1800 Ursula Alert, MD      . Warfarin - Pharmacist Dosing Inpatient   Does not apply [id number] Ursula Alert, MD        Lab Results:  Results for orders placed during the hospital encounter of 12/28/13 (from the past 48 hour(s))  PROTIME-INR     Status: Abnormal   Collection Time    01/03/14  6:34 AM      Result Value Ref Range   Prothrombin Time 20.7 (*) 11.6 - 15.2 seconds   INR 1.77 (*)  0.00 - 1.49   Comment: Performed at [place]     Status: Abnormal   Collection Time    01/04/14  6:35 AM      Result Value Ref Range   Prothrombin Time 21.6 (*) 11.6 - 15.2 seconds   INR 1.86 (*) 0.00 - 1.49   Comment: Performed at [facility]    Physical Findings: AIMS: Facial and Oral Movements Muscles of Facial Expression: None, normal Lips and Perioral Area: None,  normal Jaw: None, normal Tongue: None, normal,Extremity Movements Upper (arms, wrists, hands, fingers): None, normal Lower (legs, knees, ankles, toes): None, normal, Trunk Movements Neck, shoulders, hips: None, normal, Overall Severity Severity of abnormal movements (highest score from questions above): None, normal Incapacitation due to abnormal movements: None, normal Patient's awareness of abnormal movements (rate only patient's report): No Awareness, Dental Status Current problems with teeth and/or dentures?: Yes (upper/lower dentures) Does patient usually wear dentures?: Yes (did not bring with her)  CIWA:  CIWA-Ar Total: 3 COWS:     Treatment Plan Summary: Daily contact with patient to assess and evaluate symptoms and progress in treatment Medication management  Plan: Continue with plan of care Continue crisis management Encourage to participate in group and individual sessions Continue medication management/ and review as needed Increase Zyprexa Zydis to  25 mg po daily for her mood.  May use injection if patient refuses po medications.Forced medication order in chart. Trazodone 50 mg po at bed time for sleep.  May repeat x1 if needed Sertraline 25 mg po daily for affective sx. Will add Depakote 500 mg po bid for mood lability ,anxiety. Haldol 10 mg po every 8 hours as needed for agitation.  May use injection if refuse po Haldol Ativan 2 mg po  Every 8 hours as needed for agitation/anxiety.  May use injection if refuses po Ativan Valium 5 mg po daily  as needed for anxiety/agitation. Discharge  Plan in progress Address health issues /V/S as needed     Medical Decision Making Problem Points:  Established problem, stable/improving (1) Data Points:  Order Aims Assessment (2) Review of medication regiment & side effects (2) Review of new medications or change in dosage (2)  I certify that inpatient services furnished can reasonably be expected to improve the patient's condition.   Krystall Kruckenberg MD  01/04/2014, 2:40 PM

## 2014-01-04 NOTE — Progress Notes (Signed)
D: Patient has very anxious affect and mood. Writer has observed that the patient has been pacing up and down the [place] and appears fidgety as well; she verbalized, "it's hard to keep still". She reported on the self inventory sheet that sleep, appetite and ability to concentrate are good and energy level is normal. Patient rated "0" for depression, feelings of hopelessness and ability to concentrate. Patient is very cautious about taking medication, but is tolerating it well.  A: Support and encouragement provided to patient. Administered scheduled medications per ordering MD. Monitor Q15 minute checks for safety.   R: Patient receptive. Denies SI/HI and AVH, however patient does appear to be responding to internal stimuli. Patient remains safe on the unit.

## 2014-01-04 NOTE — Progress Notes (Signed)
ANTICOAGULATION CONSULT NOTE - Follow Up Consult  Pharmacy Consult for Couamadin Indication:H/o PE  Allergies  Allergen Reactions  . Acetaminophen   . Darvocet [Propoxyphene N-Acetaminophen] Nausea And Vomiting and Other (See Comments)    Shaking     Patient Measurements: Height: 5' 4.5" (163.8 cm) Weight: 140 lb (63.504 kg) IBW/kg (Calculated) : 55.85   Vital Signs: Temp: 97.5 F (36.4 C) (10/05 0725) Temp Source: Oral (10/05 0725) BP: 106/80 mmHg (10/05 0725) Pulse Rate: 119 (10/05 0725)  Labs:  Recent Labs  01/02/14 0630 01/03/14 0634 01/04/14 0635  LABPROT 17.2* 20.7* 21.6*  INR 1.39 1.77* 1.86*    Estimated Creatinine Clearance: 66.7 ml/min (by C-G formula based on Cr of 0.95).   Medications:  Scheduled:  . benztropine  1 mg Oral QHS  . divalproex  500 mg Oral BID  . nicotine  21 mg Transdermal Daily  . OLANZapine  10 mg Intramuscular BID  . [START ON 01/05/2014] OLANZapine zydis  10 mg Oral Daily  . OLANZapine zydis  15 mg Oral QHS  . sertraline  25 mg Oral Daily  . traZODone  50 mg Oral QHS,MR X 1  . warfarin  5 mg Oral ONCE-1800  . Warfarin - Pharmacist Dosing Inpatient   Does not apply q1800    Assessment: INR increasing just below goal. No problems noted  Goal of Therapy:  INR 2-3    Plan:  Coumadin 5 mg x 1 today at 1800 PT/INR in AM  Lenox Ponds 01/04/2014,2:38 PM

## 2014-01-04 NOTE — BHH Group Notes (Signed)
[facility] LCSW Group Therapy  01/04/2014 1:15 pm  Type of Therapy: Process Group Therapy  Participation Level:  Invited.  Chose to not attend    Summary of Progress/Problems: Today's group addressed the issue of overcoming obstacles.  Patients were asked to identify their biggest obstacle post d/c that stands in the way of their on-going success, and then problem solve as to how to manage this.  Roque Lias B 01/04/2014   5:06 PM

## 2014-01-04 NOTE — BHH Group Notes (Signed)
[facility] LCSW Aftercare Discharge Planning Group Note   01/04/2014 5:04 PM  Participation Quality:  Invited.  Chose to not attend    [place], Barbaraann Rondo B

## 2014-01-05 DIAGNOSIS — F1390 Sedative, hypnotic, or anxiolytic use, unspecified, uncomplicated: Secondary | ICD-10-CM

## 2014-01-05 DIAGNOSIS — F2089 Other schizophrenia: Secondary | ICD-10-CM

## 2014-01-05 LAB — PROTIME-INR
INR: 1.96 — AB (ref 0.00–1.49)
Prothrombin Time: 22.5 seconds — ABNORMAL HIGH (ref 11.6–15.2)

## 2014-01-05 MED ORDER — WARFARIN SODIUM 5 MG PO TABS
5.0000 mg | ORAL_TABLET | Freq: Once | ORAL | Status: AC
  Administered 2014-01-05: 5 mg via ORAL
  Filled 2014-01-05: qty 1

## 2014-01-05 MED ORDER — TRAZODONE HCL 100 MG PO TABS
100.0000 mg | ORAL_TABLET | Freq: Every day | ORAL | Status: DC
  Administered 2014-01-05 – 2014-01-06 (×2): 100 mg via ORAL
  Filled 2014-01-05: qty 1
  Filled 2014-01-05: qty 14
  Filled 2014-01-05 (×2): qty 1

## 2014-01-05 NOTE — Progress Notes (Signed)
D:  Patient's self inventory sheet, patient sleeps good with sleep medication.  Good appetite, normal energy, good concentration.  Denied anxiety, hopeless, depression.  Denied withdrawals.  Denied physical problems.  Has had pain in her feet, worst pain #1.  Goal is to return home and try harder.  Does have discharge plans.  No problems taking medications after discharge. A:  Medications administered per MD orders.  Emotional support and encouragement given patient. R:  Denied SI and HI.  Denied A/V hallucinations.  Safety maintained with 15 minute checks.

## 2014-01-05 NOTE — Progress Notes (Signed)
Patient ID: [name], female   DOB: 1970-07-12, 43 y.o.   MRN: [id number] [facility] MD Progress Note  01/05/2014 11:19 AM Terree Gaultney  MRN:  [id number] Subjective: Patient states,"I want to go home, I am doing better."   Objective: Patient seen. Patient continues to pace in her room this AM ,reports having back issues as well as leg cramps since she walks around without shoes. Patient continues to be disorganized but is more goal directed and responds to questions is more linear. She denies SI/HI/AVH. Per staff patient has been taking her medications with some encouragement.  Patient has not been attending groups ,reports has back issues and cannot sit well.   DSM5: Primary psychiatric diagnosis:  Schizophrenia,multiple episodes,currently in acute episode   Secondary psychiatric diagnosis:  PTSD per history  Opioid use disorder, moderate  Tobacco use disorder  Cannabis use disorder  Sedative hypnotic and anxiolytic disorder   Non psychiatric diagnosis:  Hx of PE on warfarin  Chronic back problem Uterine ca per hx.      Total Time spent with patient: 25 minutes   ADL's:  Impaired  Sleep: Fair  Appetite:  Fair   Psychiatric Specialty Exam: Physical Exam  Constitutional: She appears well-developed and well-nourished.  HENT:  Head: Normocephalic and atraumatic.  Neck: Normal range of motion.  Respiratory: Effort normal.  GI: Soft.  Musculoskeletal: Normal range of motion.  Neurological:  Oriented to person and place  Psychiatric: Her mood appears anxious. Her affect is labile. Her speech is rapid and/or pressured (improving). She is withdrawn and actively hallucinating (but talks to self). Thought content is paranoid and delusional. Cognition and memory are impaired. She expresses impulsivity. She expresses no homicidal and no suicidal ideation.    Review of Systems  Unable to perform ROS: acuity of condition  Musculoskeletal: Positive for back pain and myalgias.   Psychiatric/Behavioral: Positive for hallucinations (talks to self). Negative for suicidal ideas. The patient is nervous/anxious.     Blood pressure 99/77, pulse 110, temperature 98.4 F (36.9 C), temperature source Oral, resp. rate 18, height 5' 4.5" (1.638 m), weight 63.504 kg (140 lb), last menstrual period 11/16/2013.Body mass index is 23.67 kg/(m^2).  General Appearance: Disheveled  Eye Contact::  Minimal  Speech:  Pressured improving  Volume:  Normal  Mood:  Anxious and Dysphoric improving  Affect:  Labile  Thought Process:  Irrelevant  Orientation:  Other:  oriented to person ,place   Thought Content:  Hallucinations: appears to be responding to internal stimuli and Paranoid Ideation  Suicidal Thoughts:  No,  Homicidal Thoughts:  No  Memory:  Immediate;   Fair Recent;   Fair Remote;   Fair  Judgement:  Poor  Insight:  Lacking  Psychomotor Activity:  Increased  Concentration:  Poor  Recall:  Poor  Fund of Knowledge:Fair  Language: Fair  Akathisia:  No  Handed:  unknown  AIMS (if indicated):     Assets:  Others:  access to healthcare  Sleep:  Number of Hours: 5   Musculoskeletal: Strength & Muscle Tone: within normal limits Gait & Station: normal Patient leans: N/A  Current Medications: Current Facility-Administered Medications  Medication Dose Route Frequency Provider Last Rate Last Dose  . alum & mag hydroxide-simeth (MAALOX/MYLANTA) 200-200-20 MG/5ML suspension 30 mL  30 mL Oral Q4H PRN Laverle Hobby, PA-C      . benztropine (COGENTIN) tablet 0.5 mg  0.5 mg Oral BID Ursula Alert, MD   0.5 mg at 01/05/14 0916  . cyclobenzaprine (FLEXERIL)  tablet 10 mg  10 mg Oral TID PRN Laverle Hobby, PA-C   10 mg at 01/04/14 2124  . diphenhydrAMINE (BENADRYL) capsule 50 mg  50 mg Oral Q8H PRN Ursula Alert, MD       Or  . diphenhydrAMINE (BENADRYL) injection 50 mg  50 mg Intramuscular Q8H PRN Ursula Alert, MD   50 mg at 12/29/13 1041  . divalproex (DEPAKOTE ER) 24 hr  tablet 500 mg  500 mg Oral BID Ursula Alert, MD   500 mg at 01/05/14 0916  . haloperidol (HALDOL) tablet 10 mg  10 mg Oral Q8H PRN Ursula Alert, MD       Or  . haloperidol lactate (HALDOL) injection 10 mg  10 mg Intramuscular Q8H PRN Ursula Alert, MD   10 mg at 12/29/13 1041  . hydrOXYzine (ATARAX/VISTARIL) tablet 25 mg  25 mg Oral TID PRN Ursula Alert, MD      . LORazepam (ATIVAN) tablet 2 mg  2 mg Oral Q8H PRN Ursula Alert, MD       Or  . LORazepam (ATIVAN) injection 2 mg  2 mg Intramuscular Q8H PRN Ursula Alert, MD   2 mg at 12/29/13 1042  . magnesium hydroxide (MILK OF MAGNESIA) suspension 30 mL  30 mL Oral Daily PRN Laverle Hobby, PA-C      . nicotine (NICODERM CQ - dosed in mg/24 hours) patch 21 mg  21 mg Transdermal Daily Ursula Alert, MD   21 mg at 01/05/14 0917  . OLANZapine (ZYPREXA) injection 10 mg  10 mg Intramuscular Daily Trey Bebee, MD      . OLANZapine (ZYPREXA) injection 15 mg  15 mg Intramuscular QHS Alma Muegge, MD      . OLANZapine zydis (ZYPREXA) disintegrating tablet 10 mg  10 mg Oral Daily Ursula Alert, MD   10 mg at 01/05/14 0917  . OLANZapine zydis (ZYPREXA) disintegrating tablet 15 mg  15 mg Oral QHS Ursula Alert, MD   15 mg at 01/04/14 2122  . sertraline (ZOLOFT) tablet 25 mg  25 mg Oral Daily Ursula Alert, MD   25 mg at 01/05/14 0918  . traZODone (DESYREL) tablet 100 mg  100 mg Oral QHS Alvon Nygaard, MD      . warfarin (COUMADIN) tablet 5 mg  5 mg Oral ONCE-1800 Ursula Alert, MD      . Warfarin - Pharmacist Dosing Inpatient   Does not apply [id number] Ursula Alert, MD        Lab Results:  Results for orders placed during the hospital encounter of 12/28/13 (from the past 48 hour(s))  PROTIME-INR     Status: Abnormal   Collection Time    01/04/14  6:35 AM      Result Value Ref Range   Prothrombin Time 21.6 (*) 11.6 - 15.2 seconds   INR 1.86 (*) 0.00 - 1.49   Comment: Performed at [place]      Status: Abnormal   Collection Time    01/05/14  6:30 AM      Result Value Ref Range   Prothrombin Time 22.5 (*) 11.6 - 15.2 seconds   INR 1.96 (*) 0.00 - 1.49   Comment: Performed at [facility]    Physical Findings: AIMS: Facial and Oral Movements Muscles of Facial Expression: None, normal Lips and Perioral Area: None, normal Jaw: None, normal Tongue: None, normal,Extremity Movements Upper (arms, wrists, hands, fingers): None, normal Lower (legs, knees, ankles, toes): None, normal, Trunk Movements  Neck, shoulders, hips: None, normal, Overall Severity Severity of abnormal movements (highest score from questions above): None, normal Incapacitation due to abnormal movements: None, normal Patient's awareness of abnormal movements (rate only patient's report): No Awareness, Dental Status Current problems with teeth and/or dentures?: Yes (upper/lower dentures) Does patient usually wear dentures?: Yes (did not bring with her)  CIWA:  CIWA-Ar Total: 3 COWS:     Treatment Plan Summary: Daily contact with patient to assess and evaluate symptoms and progress in treatment Medication management  Plan: Continue with plan of care Continue crisis management Encourage to participate in group and individual sessions Continue medication management/ and review as needed ContinueZyprexa Zydis to  25 mg po daily for her mood.  May use injection if patient refuses po medications.Forced medication order in chart. Increase Trazodone to 100 mg po at bed time for sleep.   Sertraline 25 mg po daily for affective sx. Will continue Depakote 500 mg po bid for mood lability ,anxiety. Haldol 10 mg po every 8 hours as needed for agitation.  May use injection if refuse po Haldol Ativan 2 mg po  Every 8 hours as needed for agitation/anxiety.  May use injection if refuses po Ativan Will DC Valium. Discharge  Plan in progress Address health issues /V/S as needed     Medical Decision  Making Problem Points:  Established problem, stable/improving (1) Data Points:  Order Aims Assessment (2) Review of medication regiment & side effects (2) Review of new medications or change in dosage (2)  I certify that inpatient services furnished can reasonably be expected to improve the patient's condition.   Demarian Epps MD  01/05/2014, 11:19 AM

## 2014-01-05 NOTE — Progress Notes (Signed)
Adult Psychoeducational Group Note  Date:  01/05/2014 Time:  10:19 PM  Group Topic/Focus:  Wrap-Up Group:   The focus of this group is to help patients review their daily goal of treatment and discuss progress on daily workbooks.  Participation Level:  Minimal  Participation Quality:  Drowsy  Affect:  Flat  Cognitive:  Lacking  Insight: Lacking  Engagement in Group:  Limited  Modes of Intervention:  Socialization and Support  Additional Comments:  Patient attended and participated in group tonight. She reports having a good day. Today a nurse bought her new shoe. She is eating better and talking more. Wellness to her means being happy.  [name] [facility] 01/05/2014, 10:19 PM

## 2014-01-05 NOTE — Progress Notes (Signed)
ANTICOAGULATION CONSULT NOTE - Follow Up Consult  Pharmacy Consult for Coumadin Indication: H/o pe  Allergies  Allergen Reactions  . Acetaminophen   . Darvocet [Propoxyphene N-Acetaminophen] Nausea And Vomiting and Other (See Comments)    Shaking     Patient Measurements: Height: 5' 4.5" (163.8 cm) Weight: 140 lb (63.504 kg) IBW/kg (Calculated) : 55.85 H  Vital Signs: Temp: 98.4 F (36.9 C) (10/06 0635) Temp Source: Oral (10/06 0635) BP: 99/77 mmHg (10/06 0636) Pulse Rate: 110 (10/06 0636)  Labs:  Recent Labs  01/03/14 0634 01/04/14 0635 01/05/14 0630  LABPROT 20.7* 21.6* 22.5*  INR 1.77* 1.86* 1.96*    Estimated Creatinine Clearance: 66.7 ml/min (by C-G formula based on Cr of 0.95).   Medications:  Scheduled:  . benztropine  0.5 mg Oral BID  . divalproex  500 mg Oral BID  . nicotine  21 mg Transdermal Daily  . OLANZapine  10 mg Intramuscular Daily  . OLANZapine  15 mg Intramuscular QHS  . OLANZapine zydis  10 mg Oral Daily  . OLANZapine zydis  15 mg Oral QHS  . sertraline  25 mg Oral Daily  . traZODone  50 mg Oral QHS,MR X 1  . warfarin  5 mg Oral ONCE-1800  . Warfarin - Pharmacist Dosing Inpatient   Does not apply q1800    Assessment: Coumadin just below goal of INR 2-3.  No problems noted with therapy. Goal of Therapy:  INR 2-3    Plan:  Coumadin 5 mg x 1 today. PT/INR in am   Lenox Ponds 01/05/2014,9:40 AM

## 2014-01-05 NOTE — Progress Notes (Signed)
The focus of this group is to educate the patient on the purpose and policies of crisis stabilization and provide a format to answer questions about their admission.  The group details unit policies and expectations of patients while admitted.  Patient did not attend morning nurse education orientation.  Patient stayed in her room.

## 2014-01-05 NOTE — BHH Group Notes (Signed)
[facility] LCSW Group Therapy  01/05/2014 1:39 PM  Type of Therapy:  Group Therapy  Participation Level:  Did Not Attend-invited/chose not to attend. Pt walked into group for about five minutes, paced the room, and left.   Smart, Agata Lucente LCSWA  01/05/2014, 1:39 PM

## 2014-01-06 LAB — PROTIME-INR
INR: 2.3 — ABNORMAL HIGH (ref 0.00–1.49)
Prothrombin Time: 25.5 seconds — ABNORMAL HIGH (ref 11.6–15.2)

## 2014-01-06 MED ORDER — LAMOTRIGINE 25 MG PO TABS
25.0000 mg | ORAL_TABLET | Freq: Every day | ORAL | Status: DC
  Administered 2014-01-07: 25 mg via ORAL
  Filled 2014-01-06 (×4): qty 1
  Filled 2014-01-06: qty 14

## 2014-01-06 MED ORDER — WARFARIN SODIUM 2.5 MG PO TABS
2.5000 mg | ORAL_TABLET | Freq: Once | ORAL | Status: AC
  Administered 2014-01-06: 2.5 mg via ORAL
  Filled 2014-01-06 (×2): qty 1

## 2014-01-06 MED ORDER — SERTRALINE HCL 50 MG PO TABS
50.0000 mg | ORAL_TABLET | Freq: Every day | ORAL | Status: DC
  Administered 2014-01-07: 50 mg via ORAL
  Filled 2014-01-06: qty 14
  Filled 2014-01-06 (×2): qty 1

## 2014-01-06 NOTE — Progress Notes (Signed)
Pt has been up and pacing, anxious in affect with congruent mood. She denies AVH however is observed to be mumbling to self as she walks. Offered support and medications. Pt resistant but took all meds except depakote stating it would "mess with my warfarin." MD spoke with pharmacy who said the 2 drugs would not interact however patient was still steadfast in her refusal. MD d/c depakote and ordered lamictal for mood stabilization however when offered, patient refused stating, "I don't need no mood stabilizer." MD notified. Pt denies SI/HI, pain or problems. Jamie Kato

## 2014-01-06 NOTE — Progress Notes (Signed)
D: Patient in her room on approach.  Patient states she had a good day.  Patient states she is ready to go home tomorrow.  Patient states she is feeling much better and states she has been taking her medications.  Patient denies SI/HI.  Patient states has hallucinations but they are mild. A: Staff to monitor Q 15 mins for safety.  Encouragement and support offered.  Scheduled medications administered per orders.  Flexeril administered prn for muscle spasms R: Patient remains safe on the unit.  Patient attended group tonight.  Patient visible on the unit.  Patient taking administered medications.

## 2014-01-06 NOTE — Progress Notes (Signed)
Patient ID: [name], female   DOB: Nov 17, 1970, 43 y.o.   MRN: [id number] D: Client found in room sitting on bed, reports "I ain't hearing no voices I'm not supposed to maam" "I'm ready to go home"  Client affect is brighter, has a slight smile, show me her new shoes, "they comfortable too" Client expressed that she is worried about her animals, "I hope they all right" Client reports she has good support system when discharged in her friends. Client hesitant about taking medications.  A: Writer reviewed medications and encouraged her to take them. Staff will monitor q49min for safety. R: Client takes medications, did not attend group.

## 2014-01-06 NOTE — [facility] Group Notes (Signed)
Adult Psychoeducational Group Note  Date:  01/06/2014 Time:  9:20 PM  Group Topic/Focus:  Wrap-Up Group:   The focus of this group is to help patients review their daily goal of treatment and discuss progress on daily workbooks.  Participation Level:  Active  Participation Quality:  Appropriate  Affect:  Appropriate  Cognitive:  Appropriate  Insight: Good  Engagement in Group:  Engaged  Modes of Intervention:  Discussion  Additional Comments:  [name] stated her day was good.  She attended groups, was able to get some bottles of coca cola and some cash.  She also had a visitor.  She is discharging tomorrow to go home.  Victorino Sparrow A 01/06/2014, 9:20 PM

## 2014-01-06 NOTE — Progress Notes (Signed)
Patient ID: [name], female   DOB: [birth year], 43 y.o.   MRN: [id number] [facility] MD Progress Note  01/06/2014 11:09 AM [name]  MRN:  [id number] Subjective: Patient states,"I want to go home, my animals are not doing well."  Objective: Patient seen. Patient appears to be more calm and goal directed. Patient reports that the shoes made a big difference and she reports improvement with her feet cramps. Patient reports being worried about her animals as well as going home.Patient reports sleep as OK and denies any SI/HI/AH/VH. Patient did not want to take her Depakote and was educated about continuing it while monitoring her PT/INR. However patient reports being told in the past that depakote means 'death sentence and is reluctant to take it". Patient hence will be started on lamictal for her mood lability. Patient denies any side effects. Patient has been encouraged to attend groups.   Per staff patient has been refusing to take mood stabilizers ,reports she does not need it. Patient has been taking her zyprexa as well as zoloft.  DSM5: Primary psychiatric diagnosis:  Schizophrenia,multiple episodes,currently in acute episode   Secondary psychiatric diagnosis:  PTSD per history  Opioid use disorder, moderate  Tobacco use disorder  Cannabis use disorder  Sedative hypnotic and anxiolytic disorder   Non psychiatric diagnosis:  Hx of PE on warfarin  Chronic back problem Uterine ca per hx.      Total Time spent with patient: 30 minutes   ADL's:  Impaired  Sleep: Fair  Appetite:  Fair   Psychiatric Specialty Exam: Physical Exam  Constitutional: She appears well-developed and well-nourished.  HENT:  Head: Normocephalic and atraumatic.  Neck: Normal range of motion.  Respiratory: Effort normal.  GI: Soft.  Musculoskeletal: Normal range of motion.  Neurological:  Oriented to person and place  Psychiatric: Her mood appears anxious. Her affect is labile. Her speech is rapid  and/or pressured (improving). She is withdrawn and actively hallucinating (but talks to self). Thought content is paranoid and delusional. Cognition and memory are impaired. She expresses impulsivity. She expresses no homicidal and no suicidal ideation.    Review of Systems  Unable to perform ROS: acuity of condition  Musculoskeletal: Positive for back pain and myalgias.  Psychiatric/Behavioral: Positive for hallucinations (talks to self). Negative for suicidal ideas. The patient is nervous/anxious.     Blood pressure 98/75, pulse 114, temperature 98.2 F (36.8 C), temperature source Oral, resp. rate 18, height 5' 4.5" (1.638 m), weight 63.504 kg (140 lb), last menstrual period 11/16/2013.Body mass index is 23.67 kg/(m^2).  General Appearance: Disheveled  Eye Contact::  Minimal  Speech:  Pressured improving,  Volume:  Normal  Mood:  Anxious and Dysphoric improving  Affect:  Labile  Thought Process:  Linear  Orientation:  Other:  oriented to person ,place   Thought Content:  Paranoid Ideation  Suicidal Thoughts:  No,  Homicidal Thoughts:  No  Memory:  Immediate;   Fair Recent;   Fair Remote;   Fair  Judgement:  Poor  Insight:  Lacking  Psychomotor Activity:  Increased  Concentration:  Poor  Recall:  Poor  Fund of Knowledge:Fair  Language: Fair  Akathisia:  No  Handed:  unknown  AIMS (if indicated):     Assets:  Others:  access to healthcare  Sleep:  Number of Hours: 5.75   Musculoskeletal: Strength & Muscle Tone: within normal limits Gait & Station: normal Patient leans: N/A  Current Medications: Current Facility-Administered Medications  Medication Dose Route Frequency Provider Last  Rate Last Dose  . alum & mag hydroxide-simeth (MAALOX/MYLANTA) 200-200-20 MG/5ML suspension 30 mL  30 mL Oral Q4H PRN Laverle Hobby, PA-C      . benztropine (COGENTIN) tablet 0.5 mg  0.5 mg Oral BID Ursula Alert, MD   0.5 mg at 01/06/14 0743  . cyclobenzaprine (FLEXERIL) tablet 10 mg  10  mg Oral TID PRN Laverle Hobby, PA-C   10 mg at 01/04/14 2124  . diphenhydrAMINE (BENADRYL) capsule 50 mg  50 mg Oral Q8H PRN Ursula Alert, MD       Or  . diphenhydrAMINE (BENADRYL) injection 50 mg  50 mg Intramuscular Q8H PRN Ursula Alert, MD   50 mg at 12/29/13 1041  . haloperidol (HALDOL) tablet 10 mg  10 mg Oral Q8H PRN Ursula Alert, MD       Or  . haloperidol lactate (HALDOL) injection 10 mg  10 mg Intramuscular Q8H PRN Anandi Abramo, MD   10 mg at 12/29/13 1041  . hydrOXYzine (ATARAX/VISTARIL) tablet 25 mg  25 mg Oral TID PRN Ursula Alert, MD      . lamoTRIgine (LAMICTAL) tablet 25 mg  25 mg Oral Daily Rabab Currington, MD      . LORazepam (ATIVAN) tablet 2 mg  2 mg Oral Q8H PRN Ursula Alert, MD       Or  . LORazepam (ATIVAN) injection 2 mg  2 mg Intramuscular Q8H PRN Ursula Alert, MD   2 mg at 12/29/13 1042  . magnesium hydroxide (MILK OF MAGNESIA) suspension 30 mL  30 mL Oral Daily PRN Laverle Hobby, PA-C      . nicotine (NICODERM CQ - dosed in mg/24 hours) patch 21 mg  21 mg Transdermal Daily Ursula Alert, MD   21 mg at 01/06/14 0747  . OLANZapine (ZYPREXA) injection 10 mg  10 mg Intramuscular Daily Shahir Karen, MD      . OLANZapine (ZYPREXA) injection 15 mg  15 mg Intramuscular QHS Tomoya Ringwald, MD      . OLANZapine zydis (ZYPREXA) disintegrating tablet 10 mg  10 mg Oral Daily Ursula Alert, MD   10 mg at 01/06/14 0744  . OLANZapine zydis (ZYPREXA) disintegrating tablet 15 mg  15 mg Oral QHS Ursula Alert, MD   15 mg at 01/05/14 2219  . [START ON 01/07/2014] sertraline (ZOLOFT) tablet 50 mg  50 mg Oral Daily Shakeya Kerkman, MD      . traZODone (DESYREL) tablet 100 mg  100 mg Oral QHS Ursula Alert, MD   100 mg at 01/05/14 2219  . warfarin (COUMADIN) tablet 2.5 mg  2.5 mg Oral ONCE-1800 Ursula Alert, MD      . Warfarin - Pharmacist Dosing Inpatient   Does not apply [id number] Ursula Alert, MD        Lab Results:  Results for orders placed during the hospital  encounter of 12/28/13 (from the past 48 hour(s))  PROTIME-INR     Status: Abnormal   Collection Time    01/05/14  6:30 AM      Result Value Ref Range   Prothrombin Time 22.5 (*) 11.6 - 15.2 seconds   INR 1.96 (*) 0.00 - 1.49   Comment: Performed at [place] [facility]     Status: Abnormal   Collection Time    01/06/14  6:15 AM      Result Value Ref Range   Prothrombin Time 25.5 (*) 11.6 - 15.2 seconds   INR 2.30 (*) 0.00 - 1.49  Comment: Performed at [facility]    Physical Findings: AIMS: Facial and Oral Movements Muscles of Facial Expression: None, normal Lips and Perioral Area: None, normal Jaw: None, normal Tongue: None, normal,Extremity Movements Upper (arms, wrists, hands, fingers): None, normal Lower (legs, knees, ankles, toes): None, normal, Trunk Movements Neck, shoulders, hips: None, normal, Overall Severity Severity of abnormal movements (highest score from questions above): None, normal Incapacitation due to abnormal movements: None, normal Patient's awareness of abnormal movements (rate only patient's report): No Awareness, Dental Status Current problems with teeth and/or dentures?: Yes (wears dentures) Does patient usually wear dentures?: Yes (does not have dentures with her.)  CIWA:  CIWA-Ar Total: 3 COWS:  COWS Total Score: 3  Treatment Plan Summary: Daily contact with patient to assess and evaluate symptoms and progress in treatment Medication management  Plan: Continue with plan of care Continue crisis management Encourage to participate in group and individual sessions Continue medication management/ and review as needed Continue Zyprexa Zydis  25 mg po daily for her mood.  May use injection if patient refuses po medications.Forced medication order in chart. Continue Trazodone 100 mg po at bed time for sleep.   Sertraline 50 mg po daily for affective sx. Will continue discontinue  Depakote due to patient being  concerned about side effects. Will start a trial of Lamictal 25 mg . (But patient has been refusing to take it ). Patient encouraged to take medications Haldol 10 mg po every 8 hours as needed for agitation.  May use injection if refuse po Haldol Ativan 2 mg po  Every 8 hours as needed for agitation/anxiety.  May use injection if refuses po Ativan Will DC Valium. Discharge  Plan in progress Address health issues /V/S as needed     Medical Decision Making Problem Points:  Established problem, stable/improving (1) Data Points:  Order Aims Assessment (2) Review of medication regiment & side effects (2) Review of new medications or change in dosage (2)  I certify that inpatient services furnished can reasonably be expected to improve the patient's condition.   Aminah Zabawa MD  01/06/2014, 11:09 AM

## 2014-01-06 NOTE — Progress Notes (Signed)
ANTICOAGULATION CONSULT NOTE - Follow Up Consult   Pharmacy Consult for coumadin Indication: h/o pe  Allergies  Allergen Reactions  . Acetaminophen   . Darvocet [Propoxyphene N-Acetaminophen] Nausea And Vomiting and Other (See Comments)    Shaking     Patient Measurements: Height: 5' 4.5" (163.8 cm) Weight: 140 lb (63.504 kg) IBW/kg (Calculated) : 55.85    Labs:  Recent Labs  01/04/14 0635 01/05/14 0630 01/06/14 0615  LABPROT 21.6* 22.5* 25.5*  INR 1.86* 1.96* 2.30*    Estimated Creatinine Clearance: 66.7 ml/min (by C-G formula based on Cr of 0.95).   Medications:  Scheduled:  . benztropine  0.5 mg Oral BID  . divalproex  500 mg Oral BID  . nicotine  21 mg Transdermal Daily  . OLANZapine  10 mg Intramuscular Daily  . OLANZapine  15 mg Intramuscular QHS  . OLANZapine zydis  10 mg Oral Daily  . OLANZapine zydis  15 mg Oral QHS  . sertraline  25 mg Oral Daily  . traZODone  100 mg Oral QHS  . warfarin  2.5 mg Oral ONCE-1800  . Warfarin - Pharmacist Dosing Inpatient   Does not apply q1800    Assessment: INR at goal today.  No problems noted  Suggest for discharge coumadin 5 mg daily on mon,tue, thur, fri, sat and 2.5 mg on wed and sun with followup with 1 week of discharge.  Patient has had elevated inr well above goal and decreased inr well below goal over the past month.  Goal of Therapy:  INR 2-3    Plan:  Coumadin 2.5 mg x 1 today PT/INR in am   Lenox Ponds 01/06/2014,8:42 AM

## 2014-01-06 NOTE — Plan of Care (Signed)
Problem: Alteration in thought process Goal: STG-Patient is able to discuss thoughts with staff Outcome: Not Progressing Patient somewhat guarded, preoccupied. Forwards little information.  Problem: Diagnosis: Increased Risk For Suicide Attempt Goal: STG-Patient Will Comply With Medication Regime Outcome: Not Progressing Pt refusing depakote even after strong encouragement from this RN

## 2014-01-06 NOTE — [facility] Group Notes (Signed)
[facility] LCSW Aftercare Discharge Planning Group Note   01/06/2014 9:55 AM  Participation Quality:  [name] briefly participated in group. Before leaving she stated she will make phone calls regarding her transportation after discharge.   Brooks,Joy

## 2014-01-07 DIAGNOSIS — F259 Schizoaffective disorder, unspecified: Principal | ICD-10-CM

## 2014-01-07 MED ORDER — LAMOTRIGINE 25 MG PO TABS: 25.0000 mg | ORAL_TABLET | Freq: Every day | ORAL | Status: DC

## 2014-01-07 MED ORDER — WARFARIN SODIUM 2.5 MG PO TABS
2.5000 mg | ORAL_TABLET | Freq: Every day | ORAL | Status: DC
Start: 2014-01-07 — End: 2014-05-21

## 2014-01-07 MED ORDER — HYDROXYZINE HCL 25 MG PO TABS: 25.0000 mg | ORAL_TABLET | Freq: Three times a day (TID) | ORAL | Status: DC | PRN

## 2014-01-07 MED ORDER — SERTRALINE HCL 50 MG PO TABS: 50.0000 mg | ORAL_TABLET | Freq: Every day | ORAL | Status: DC

## 2014-01-07 MED ORDER — TRAZODONE HCL 100 MG PO TABS: 100.0000 mg | ORAL_TABLET | Freq: Every day | ORAL | Status: DC

## 2014-01-07 MED ORDER — OLANZAPINE 5 MG PO TBDP
15.0000 mg | ORAL_TABLET | Freq: Every day | ORAL | Status: DC
  Filled 2014-01-07: qty 42

## 2014-01-07 MED ORDER — BENZTROPINE MESYLATE 0.5 MG PO TABS: 0.5000 mg | ORAL_TABLET | Freq: Two times a day (BID) | ORAL | Status: DC

## 2014-01-07 MED ORDER — OLANZAPINE 15 MG PO TBDP: 15.0000 mg | ORAL_TABLET | Freq: Every day | ORAL | Status: DC

## 2014-01-07 MED ORDER — WARFARIN SODIUM 5 MG PO TABS: 5.0000 mg | ORAL_TABLET | Freq: Every day | ORAL | Status: AC

## 2014-01-07 MED ORDER — OLANZAPINE 10 MG PO TBDP: 10.0000 mg | ORAL_TABLET | Freq: Every day | ORAL | Status: DC

## 2014-01-07 MED ORDER — CYCLOBENZAPRINE HCL 10 MG PO TABS: 10.0000 mg | ORAL_TABLET | Freq: Three times a day (TID) | ORAL | Status: AC | PRN

## 2014-01-07 NOTE — [facility] Suicide Risk Assessment (Signed)
Demographic Factors:  Low socioeconomic status  Total Time spent with patient: 20 minutes  Psychiatric Specialty Exam: Physical Exam  Constitutional: She is oriented to person, place, and time. She appears well-developed and well-nourished.  HENT:  Head: Normocephalic and atraumatic.  Neck: Normal range of motion.  Respiratory: Effort normal.  GI: Soft.  Musculoskeletal: Normal range of motion.  Neurological: She is alert and oriented to person, place, and time.  Skin: Skin is warm.  Psychiatric: She has a normal mood and affect. Her speech is normal and behavior is normal. Judgment normal. Thought content is not paranoid. Cognition and memory are normal. She expresses no homicidal and no suicidal ideation.    Review of Systems  Constitutional: Negative.   HENT: Negative.   Eyes: Negative.   Respiratory: Negative.   Cardiovascular: Negative.   Gastrointestinal: Negative.   Genitourinary: Negative.   Musculoskeletal: Negative.   Skin: Negative.   Psychiatric/Behavioral: Negative for depression, suicidal ideas, hallucinations and substance abuse. The patient is not nervous/anxious.     Blood pressure 91/61, pulse 106, temperature 97.9 F (36.6 C), temperature source Oral, resp. rate 18, height 5' 4.5" (1.638 m), weight 63.504 kg (140 lb), last menstrual period 11/16/2013.Body mass index is 23.67 kg/(m^2).  General Appearance: Casual  Eye Contact::  Fair  Speech:  Normal Rate  Volume:  Normal  Mood:  Euthymic  Affect:  Congruent  Thought Process:  Goal Directed  Orientation:  Full (Time, Place, and Person)  Thought Content:  WDL  Suicidal Thoughts:  No  Homicidal Thoughts:  No  Memory:  Immediate;   Fair Recent;   Fair Remote;   Fair  Judgement:  Fair  Insight:  Fair  Psychomotor Activity:  Normal  Concentration:  Fair  Recall:  [employer] [place]  Language: Fair  Akathisia:  No    AIMS (if indicated):   0  Assets:  [employer]/support/administrative officer Social  Support  Sleep:  Number of Hours: 5.75    Musculoskeletal: Strength & Muscle Tone: within normal limits Gait & Station: normal Patient leans: N/A   Mental Status Per Nursing Assessment::   On Admission:     Current Mental Status by Physician: denies SI/HI/AH/VH  Loss Factors: Financial problems/change in socioeconomic status  Historical Factors: Impulsivity  Risk Reduction Factors:   Living with another person, especially a relative and Positive social support  Continued Clinical Symptoms:  Previous Psychiatric Diagnoses and Treatments Medical Diagnoses and Treatments/Surgeries  Cognitive Features That Contribute To Risk:  Thought constriction (tunnel vision)    Suicide Risk:  Minimal: No identifiable suicidal ideation.  Patients presenting with no risk factors but with morbid ruminations; may be classified as minimal risk based on the severity of the depressive symptoms  Discharge Diagnoses:  Primary psychiatric diagnosis:  Schizophrenia,multiple episodes,currently in (acute episode -RESOLVED)  Secondary psychiatric diagnosis:  PTSD per history  Opioid use disorder, moderate  Tobacco use disorder  Cannabis use disorder  Sedative hypnotic and anxiolytic disorder   Non psychiatric diagnosis:  Hx of PE on warfarin  Chronic back problem  Uterine ca per hx.    Past Medical History  Diagnosis Date  . Pulmonary emboli   . Schizophrenia   . Anxiety   . Depression   . PTSD (post-traumatic stress disorder)   . Cancer     pt denies ever having Any cancer  . Uterus cancer     Plan Of Care/Follow-up recommendations:  Activity:  NO RESTRICTIONS  Is patient on multiple antipsychotic  therapies at discharge:  No   Has Patient had three or more failed trials of antipsychotic monotherapy by history:  No  Recommended Plan for Multiple Antipsychotic Therapies: NA    Holger Sokolowski md 01/07/2014, 9:09 AM

## 2014-01-07 NOTE — Progress Notes (Signed)
D:  Patient denied SI and HI.  Denied A/V hallucinations.  Denied pain. A:  Medications given as ordered by MD.   Emotional support and encouragement given patient.  R:  Safety maintained with 15 minute checks.  Patient refused to take zyprexa scheduled this morning.  Nurse explained to patient that she needed to take all her medications before she is discharged.  Patient reluctantly took zyprexa.

## 2014-01-07 NOTE — Progress Notes (Signed)
Discharge Note:  Patient discharged home with family member.  Patient denied SI and HI.  Denied A/V hallucinations. Patient stated she received all her belongings, clothing, misc items, prescriptions.  Suicide prevention information given and discussed with patient who stated she understood and had no questions.  Patient stated she received all her belongings.  Patient did not want to wait for her locker to be opened by security but did stay in locker room.  Then patient would not wait for her medications.  Nurse took medications out to patient in the parking lot, but patient and family member had already left.  Patient did thank nurse for assistance she received at [facility].

## 2014-01-07 NOTE — Discharge Summary (Signed)
Physician Discharge Summary Note  Patient:  [name] is an 43 y.o., female MRN:  [id number] DOB:  25-Jul-1970 Patient phone:  [phone] (home)  Patient address:   [address] [place] [address],  Total Time spent with patient: 45 minutes  Date of Admission:  12/28/2013 Date of Discharge: 01/07/2014  Reason for Admission:  Schizoaffective disorder Discharge Diagnoses: Active Problems:   Schizoaffective disorder   Psychiatric Specialty Exam: Physical Exam  Psychiatric: Her speech is normal and behavior is normal. Judgment and thought content normal. Her mood appears not anxious. Cognition and memory are normal.  Patient appeared to pacing.  Maintained eye contact during discharge instructions from the nurse    Review of Systems  Constitutional: Negative.   Eyes: Negative.   Respiratory: Negative.   Cardiovascular: Negative.   Gastrointestinal: Negative.   Genitourinary: Negative.   Musculoskeletal: Negative.   Skin: Negative.   Neurological: Negative.   Endo/Heme/Allergies: Negative.   Psychiatric/Behavioral: Positive for depression (Hx of, stabilized, chronic). Negative for suicidal ideas, hallucinations (Hx of, stabilized), memory loss and substance abuse. The patient is nervous/anxious (Hx of, chronic) and has insomnia (Hx of).     Blood pressure 91/61, pulse 106, temperature 97.9 F (36.6 C), temperature source Oral, resp. rate 18, height 5' 4.5" (1.638 m), weight 63.504 kg (140 lb), last menstrual period 11/16/2013.Body mass index is 23.67 kg/(m^2).   Past Psychiatric History: Diagnosis:  Schizoaffective disorder  Hospitalizations:  See HPI  Outpatient Care:  See HPI  Substance Abuse Care:   See HPI  Self-Mutilation:   See HPI  Suicidal Attempts:   See HPI  Violent Behaviors:   See HPI   Musculoskeletal: Strength & Muscle Tone: within normal limits Gait & Station: normal Patient leans: N/A  DSM5: Primary psychiatric diagnosis:   Schizophrenia,multiple episodes,currently in (acute episode -RESOLVED)   Secondary psychiatric diagnosis:  PTSD per history  Opioid use disorder, moderate  Tobacco use disorder  Cannabis use disorder  Sedative hypnotic and anxiolytic disorder   Non psychiatric diagnosis:  Hx of PE on warfarin  Chronic back problem  Uterine ca per hx.    Level of Care:  OP  Hospital Course:   [name] is a 43 y.o. female patient who was brought to [facility] by her boyfriend on several occassions.  According to boyfriend the patient had been acting strange "talking to her self, walking in the middle of the road at night, putting cans in the microwave.  He was concerned about patient's safety as her behavior worsened over past few days.  In the ED, patient stated that she was hearing voices, " universe is stuck in the clouds and I got to untangle it and pick it out."  Patient appeared disorganized with laughing, smiling, as if responding to internal stimuli, having visual hallucinations. The boyfriend of patient stated that she got worse the past few days.   [name] was observed to be pacing hallways and talking to self.  At times she was aggressive and psychotic.  Additionally, the patient was also guarded and resistive to care. Patient was started on Zyprexa zydis as well as Deapkote for psychosis and mood lability. Patient initially refuced all her medications.  Patient was started on [place]. Patient thereafter became more compliant with her Zyprexa. Patient was reluctant and refused Depakote, insisting that it interfered with her warfarin.  It was discontinued and Lamictal 25 mg was prescribed instead.  As days progressed she became more compliant with medication management and was more  goal directed. Haldol was given prn for any symptoms of agitation as needed.  Olanzapine zydis 25 mg was given daily for mood stabilization.  She also was administered Sertraline 50 mg and Lamictal 25 mg to help with  her depression and stabilize her moods.  She was will continue these meds upon discharge and encouraged to be compliant with regimen.  Group therapy Brooks were provided while inpatient.  [name] will continue her care post discharge and [name] was encouraged to make the appointments to help minimize future mood instability.  On day of discharge, [name] was calm, cooperative and receptive to discharge instructions of her medications and follow up outpatient treatment.    Consults:  psychiatry  Significant Diagnostic Studies:  labs: per ED  Discharge Vitals:   Blood pressure 91/61, pulse 106, temperature 97.9 F (36.6 C), temperature source Oral, resp. rate 18, height 5' 4.5" (1.638 m), weight 63.504 kg (140 lb), last menstrual period 11/16/2013. Body mass index is 23.67 kg/(m^2). Lab Results:   Results for orders placed during the hospital encounter of 12/28/13 (from the past 72 hour(s))  PROTIME-INR     Status: Abnormal   Collection Time    01/05/14  6:30 AM      Result Value Ref Range   Prothrombin Time 22.5 (*) 11.6 - 15.2 seconds   INR 1.96 (*) 0.00 - 1.49   Comment: Performed at [facility]     Status: Abnormal   Collection Time    01/06/14  6:15 AM      Result Value Ref Range   Prothrombin Time 25.5 (*) 11.6 - 15.2 seconds   INR 2.30 (*) 0.00 - 1.49   Comment: Performed at [facility]    Physical Findings: AIMS: Facial and Oral Movements Muscles of Facial Expression: None, normal Lips and Perioral Area: None, normal Jaw: None, normal Tongue: None, normal,Extremity Movements Upper (arms, wrists, hands, fingers): None, normal Lower (legs, knees, ankles, toes): None, normal, Trunk Movements Neck, shoulders, hips: None, normal, Overall Severity Severity of abnormal movements (highest score from questions above): None, normal Incapacitation due to abnormal movements: None, normal Patient's awareness of abnormal movements  (rate only patient's report): No Awareness, Dental Status Current problems with teeth and/or dentures?: Yes (wears dentures) Does patient usually wear dentures?: Yes (does not have dentures with her.)  CIWA:  CIWA-Ar Total: 3 COWS:  COWS Total Score: 3  Psychiatric Specialty Exam: See Psychiatric Specialty Exam and Suicide Risk Assessment completed by Attending Physician prior to discharge.  Discharge destination:  Home  Is patient on multiple antipsychotic therapies at discharge:  No   Has Patient had three or more failed trials of antipsychotic monotherapy by history:  No  Recommended Plan for Multiple Antipsychotic Therapies: NA     Medication List    STOP taking these medications       diazepam 5 MG tablet  Commonly known as:  VALIUM     HYDROcodone-acetaminophen 10-325 MG per tablet  Commonly known as:  NORCO     warfarin 5 MG tablet  Commonly known as:  COUMADIN      TAKE these medications     Indication   benztropine 0.5 MG tablet  Commonly known as:  COGENTIN  Take 1 tablet (0.5 mg total) by mouth 2 (two) times daily. For involuntary movements induced by medications   Indication:  Extrapyramidal Reaction caused by Medications     cyclobenzaprine 10 MG tablet  Commonly known as:  FLEXERIL  Take 1 tablet (10 mg total) by mouth 3 (three) times daily as needed for muscle spasms. For muscle spasms   Indication:  Muscle Spasm     hydrOXYzine 25 MG tablet  Commonly known as:  ATARAX/VISTARIL  Take 1 tablet (25 mg total) by mouth 3 (three) times daily as needed for anxiety. For anxiety   Indication:  Tension, Anxiety     lamoTRIgine 25 MG tablet  Commonly known as:  LAMICTAL  Take 1 tablet (25 mg total) by mouth daily. For mood stabilization   Indication:  Mood stabilization     olanzapine zydis 15 MG disintegrating tablet  Commonly known as:  ZYPREXA  Take 1 tablet (15 mg total) by mouth at bedtime. For mood stabilization and agitation   Indication:  Mood  Stabilization     sertraline 50 MG tablet  Commonly known as:  ZOLOFT  Take 1 tablet (50 mg total) by mouth daily. For depression   Indication:  Major Depressive Disorder     traZODone 100 MG tablet  Commonly known as:  DESYREL  Take 1 tablet (100 mg total) by mouth at bedtime. For insomnia   Indication:  Trouble Sleeping           Follow-up Information   Follow up with [facility]. (see valerie.)    Contact information:   [address] [place] [address] Phone: [phone] Fax: [phone]      Follow-up recommendations:  Activity:  As tolerated Diet:  As tolerated  Comments:  Treatment Plan/Recommendations:  Admit for crisis management and mood stabilization. Medication management to re-stabilize current mood symptoms Group counseling Brooks for coping skills Medical consults as needed Review and reinstate any pertinent home medications for other health problems   Total Discharge Time:  Greater than 30 minutes.  SignedKerrie Buffalo MAY, AGNP-BC 01/07/2014, 9:50 AM   Patient was seen face to face for psychiatric evaluation, suicide risk assessment and case discussed with treatment team and NP and made appropriate disposition plans. Reviewed the information documented and agree with the treatment plan.     Ursula Alert ,MD Attending [facility]

## 2014-01-07 NOTE — Progress Notes (Signed)
[facility] Adult Case Management Discharge Plan :  Will you be returning to the same living situation after discharge: Yes,  home At discharge, do you have transportation home?:Yes,  boyfriend Carmie Kanner [phone] Do you have the ability to pay for your medications:Yes,  MCD  Release of information consent forms completed and in the chart;  Patient's signature needed at discharge.  Patient to Follow up at: Follow-up Information   Follow up with [facility]. (Go to the walk-in clinic between 8 and 9 AM M-F for your hospital follow up appointment.  Or, follow up with your provider at [facility] next week.)    Contact information:   [address] [place] [address] Phone: [phone] Fax: [phone]      Patient denies SI/HI:   Yes,  yes    Safety Planning and Suicide Prevention discussed:  Yes,  yes  Trish Mage 01/07/2014, 10:13 AM

## 2014-01-07 NOTE — Tx Team (Signed)
  Interdisciplinary Treatment Plan Update   Date Reviewed:  01/07/2014  Time Reviewed:  8:04 AM  Progress in Treatment:   Attending groups: Yes Participating in groups: Yes Taking medication as prescribed: Yes  Tolerating medication: Yes Family/Significant other contact made: Yes  Patient understands diagnosis: Yes  Discussing patient identified problems/goals with staff: Yes  See initial care plan Medical problems stabilized or resolved: Yes Denies suicidal/homicidal ideation: Yes  In tx team Patient has not harmed self or others: Yes  For review of initial/current patient goals, please see plan of care.  Estimated Length of Stay:  D/C today  Reason for Continuation of Hospitalization:   New Problems/Goals identified:  N/A  Discharge Plan or Barriers:   return home, follow up outpt  Additional Comments:  Attendees:  Signature: Steva Colder, MD 01/07/2014 8:04 AM   Signature: Ripley Fraise, LCSW 01/07/2014 8:04 AM  Signature: Elmarie Shiley, NP 01/07/2014 8:04 AM  Signature: Mayra Neer, RN 01/07/2014 8:04 AM  Signature: Darrol Angel, RN 01/07/2014 8:04 AM  Signature:  01/07/2014 8:04 AM  Signature:   01/07/2014 8:04 AM  Signature:    Signature:    Signature:    Signature:    Signature:    Signature:      Scribe for Treatment Team:   Ripley Fraise, LCSW  01/07/2014 8:04 AM

## 2014-01-11 NOTE — Progress Notes (Signed)
Patient Discharge Instructions:  After Visit Summary (AVS):   Faxed to:  01/11/14 Discharge Summary Note:   Faxed to:  01/11/14 Psychiatric Admission Assessment Note:   Faxed to:  01/11/14 Suicide Risk Assessment - Discharge Assessment:   Faxed to:  01/11/14 Faxed/Sent to the Next Level Care provider:  01/11/14 Faxed to [facility] [place], 01/11/2014, 3:12 PM

## 2014-05-21 ENCOUNTER — Encounter (HOSPITAL_COMMUNITY): Payer: Self-pay | Admitting: Emergency Medicine

## 2014-05-21 ENCOUNTER — Emergency Department (HOSPITAL_COMMUNITY)
Admission: EM | Admit: 2014-05-21 | Discharge: 2014-05-24 | Disposition: A | Discharge status: Home / Self Care | Payer: Medicaid Other | Attending: Emergency Medicine | Admitting: Emergency Medicine

## 2014-05-21 DIAGNOSIS — Z7901 Long term (current) use of anticoagulants: Secondary | ICD-10-CM | POA: Diagnosis not present

## 2014-05-21 DIAGNOSIS — Z3202 Encounter for pregnancy test, result negative: Secondary | ICD-10-CM | POA: Insufficient documentation

## 2014-05-21 DIAGNOSIS — Z9119 Patient's noncompliance with other medical treatment and regimen: Secondary | ICD-10-CM | POA: Insufficient documentation

## 2014-05-21 DIAGNOSIS — R000 Tachycardia, unspecified: Secondary | ICD-10-CM | POA: Insufficient documentation

## 2014-05-21 DIAGNOSIS — F1310 Sedative, hypnotic or anxiolytic abuse, uncomplicated: Secondary | ICD-10-CM | POA: Diagnosis not present

## 2014-05-21 DIAGNOSIS — Z86711 Personal history of pulmonary embolism: Secondary | ICD-10-CM | POA: Diagnosis not present

## 2014-05-21 DIAGNOSIS — F319 Bipolar disorder, unspecified: Secondary | ICD-10-CM | POA: Diagnosis not present

## 2014-05-21 DIAGNOSIS — Z720 Tobacco use: Secondary | ICD-10-CM | POA: Diagnosis not present

## 2014-05-21 DIAGNOSIS — Z79899 Other long term (current) drug therapy: Secondary | ICD-10-CM | POA: Diagnosis not present

## 2014-05-21 DIAGNOSIS — F1210 Cannabis abuse, uncomplicated: Secondary | ICD-10-CM | POA: Diagnosis not present

## 2014-05-21 DIAGNOSIS — F200 Paranoid schizophrenia: Secondary | ICD-10-CM | POA: Insufficient documentation

## 2014-05-21 DIAGNOSIS — Z008 Encounter for other general examination: Secondary | ICD-10-CM | POA: Diagnosis present

## 2014-05-21 DIAGNOSIS — F419 Anxiety disorder, unspecified: Secondary | ICD-10-CM | POA: Insufficient documentation

## 2014-05-21 DIAGNOSIS — F259 Schizoaffective disorder, unspecified: Secondary | ICD-10-CM | POA: Diagnosis present

## 2014-05-21 DIAGNOSIS — Z8542 Personal history of malignant neoplasm of other parts of uterus: Secondary | ICD-10-CM | POA: Diagnosis not present

## 2014-05-21 LAB — COMPREHENSIVE METABOLIC PANEL
ALK PHOS: 81 U/L (ref 39–117)
ALT: 19 U/L (ref 0–35)
AST: 51 U/L — AB (ref 0–37)
Albumin: 4.4 g/dL (ref 3.5–5.2)
Anion gap: 16 — ABNORMAL HIGH (ref 5–15)
BUN: 14 mg/dL (ref 6–23)
CO2: 23 mmol/L (ref 19–32)
Calcium: 9.4 mg/dL (ref 8.4–10.5)
Chloride: 96 mmol/L (ref 96–112)
Creatinine, Ser: 1.08 mg/dL (ref 0.50–1.10)
GFR, EST AFRICAN AMERICAN: 71 mL/min — AB (ref >90)
GFR, EST NON AFRICAN AMERICAN: 61 mL/min — AB (ref >90)
Glucose, Bld: 104 mg/dL — ABNORMAL HIGH (ref 70–99)
Potassium: 2.8 mmol/L — ABNORMAL LOW (ref 3.5–5.1)
SODIUM: 135 mmol/L (ref 135–145)
Total Bilirubin: 1 mg/dL (ref 0.3–1.2)
Total Protein: 8.1 g/dL (ref 6.0–8.3)

## 2014-05-21 LAB — CBC WITH DIFFERENTIAL/PLATELET
BASOS PCT: 0 % (ref 0–1)
Basophils Absolute: 0.1 10*3/uL (ref 0.0–0.1)
Eosinophils Absolute: 0.1 10*3/uL (ref 0.0–0.7)
Eosinophils Relative: 1 % (ref 0–5)
HCT: 45.2 % (ref 36.0–46.0)
HEMOGLOBIN: 15.9 g/dL — AB (ref 12.0–15.0)
Lymphocytes Relative: 23 % (ref 12–46)
Lymphs Abs: 3.4 10*3/uL (ref 0.7–4.0)
MCH: 33.5 pg (ref 26.0–34.0)
MCHC: 35.2 g/dL (ref 30.0–36.0)
MCV: 95.4 fL (ref 78.0–100.0)
Monocytes Absolute: 1.3 10*3/uL — ABNORMAL HIGH (ref 0.1–1.0)
Monocytes Relative: 9 % (ref 3–12)
Neutro Abs: 9.7 10*3/uL — ABNORMAL HIGH (ref 1.7–7.7)
Neutrophils Relative %: 67 % (ref 43–77)
Platelets: 396 10*3/uL (ref 150–400)
RBC: 4.74 MIL/uL (ref 3.87–5.11)
RDW: 13.4 % (ref 11.5–15.5)
WBC: 14.5 10*3/uL — ABNORMAL HIGH (ref 4.0–10.5)

## 2014-05-21 LAB — PROTIME-INR
INR: 2.43 — AB (ref 0.00–1.49)
PROTHROMBIN TIME: 26.6 s — AB (ref 11.6–15.2)

## 2014-05-21 LAB — ETHANOL

## 2014-05-21 MED ORDER — WARFARIN - PHARMACIST DOSING INPATIENT: Freq: Every day | Status: DC

## 2014-05-21 MED ORDER — POTASSIUM CHLORIDE CRYS ER 20 MEQ PO TBCR
20.0000 meq | EXTENDED_RELEASE_TABLET | Freq: Every day | ORAL | Status: DC
  Administered 2014-05-22 – 2014-05-24 (×3): 20 meq via ORAL
  Filled 2014-05-21 (×4): qty 1

## 2014-05-21 MED ORDER — OLANZAPINE 5 MG PO TBDP
15.0000 mg | ORAL_TABLET | Freq: Every day | ORAL | Status: DC
  Administered 2014-05-21 – 2014-05-23 (×3): 15 mg via ORAL
  Filled 2014-05-21 (×6): qty 1

## 2014-05-21 MED ORDER — LORAZEPAM 1 MG PO TABS
1.0000 mg | ORAL_TABLET | Freq: Once | ORAL | Status: AC
  Administered 2014-05-21: 1 mg via ORAL
  Filled 2014-05-21: qty 1

## 2014-05-21 MED ORDER — OLANZAPINE 5 MG PO TBDP
5.0000 mg | ORAL_TABLET | Freq: Once | ORAL | Status: AC
  Administered 2014-05-21: 5 mg via ORAL
  Filled 2014-05-21: qty 1

## 2014-05-21 MED ORDER — IBUPROFEN 200 MG PO TABS: 600.0000 mg | ORAL_TABLET | Freq: Three times a day (TID) | ORAL | Status: DC | PRN

## 2014-05-21 MED ORDER — ALUM & MAG HYDROXIDE-SIMETH 200-200-20 MG/5ML PO SUSP: 30.0000 mL | ORAL | Status: DC | PRN

## 2014-05-21 MED ORDER — OLANZAPINE 10 MG PO TBDP
10.0000 mg | ORAL_TABLET | Freq: Every day | ORAL | Status: DC
  Administered 2014-05-22 – 2014-05-24 (×3): 10 mg via ORAL
  Filled 2014-05-21 (×3): qty 1

## 2014-05-21 MED ORDER — SERTRALINE HCL 50 MG PO TABS
50.0000 mg | ORAL_TABLET | Freq: Every day | ORAL | Status: DC
  Administered 2014-05-23 – 2014-05-24 (×2): 50 mg via ORAL
  Filled 2014-05-21 (×3): qty 1

## 2014-05-21 MED ORDER — LAMOTRIGINE 25 MG PO TABS
25.0000 mg | ORAL_TABLET | Freq: Every day | ORAL | Status: DC
  Administered 2014-05-22 – 2014-05-24 (×3): 25 mg via ORAL
  Filled 2014-05-21 (×4): qty 1

## 2014-05-21 MED ORDER — TRAZODONE HCL 50 MG PO TABS
50.0000 mg | ORAL_TABLET | Freq: Every evening | ORAL | Status: DC | PRN
  Administered 2014-05-23: 50 mg via ORAL
  Filled 2014-05-21: qty 1

## 2014-05-21 MED ORDER — WARFARIN SODIUM 5 MG PO TABS: 5.0000 mg | ORAL_TABLET | Freq: Every day | ORAL | Status: DC

## 2014-05-21 MED ORDER — ONDANSETRON HCL 4 MG PO TABS: 4.0000 mg | ORAL_TABLET | Freq: Three times a day (TID) | ORAL | Status: DC | PRN

## 2014-05-21 MED ORDER — TRAZODONE HCL 100 MG PO TABS: 100.0000 mg | ORAL_TABLET | Freq: Every day | ORAL | Status: DC

## 2014-05-21 MED ORDER — BENZTROPINE MESYLATE 1 MG PO TABS
0.5000 mg | ORAL_TABLET | Freq: Two times a day (BID) | ORAL | Status: DC
  Administered 2014-05-21 – 2014-05-24 (×6): 0.5 mg via ORAL
  Filled 2014-05-21 (×6): qty 1

## 2014-05-21 MED ORDER — WARFARIN SODIUM 5 MG PO TABS
5.0000 mg | ORAL_TABLET | Freq: Once | ORAL | Status: AC
  Filled 2014-05-21: qty 1

## 2014-05-21 MED ORDER — HYDROXYZINE HCL 25 MG PO TABS
25.0000 mg | ORAL_TABLET | Freq: Three times a day (TID) | ORAL | Status: DC | PRN
  Administered 2014-05-21 – 2014-05-23 (×4): 25 mg via ORAL
  Filled 2014-05-21 (×5): qty 1

## 2014-05-21 MED ORDER — OLANZAPINE 10 MG PO TBDP: 10.0000 mg | ORAL_TABLET | Freq: Every day | ORAL | Status: DC

## 2014-05-21 MED ORDER — NICOTINE 21 MG/24HR TD PT24
21.0000 mg | MEDICATED_PATCH | Freq: Every day | TRANSDERMAL | Status: DC
  Administered 2014-05-23 – 2014-05-24 (×4): 21 mg via TRANSDERMAL
  Filled 2014-05-21 (×5): qty 1

## 2014-05-21 MED ORDER — WARFARIN SODIUM 2.5 MG PO TABS: 2.5000 mg | ORAL_TABLET | Freq: Every day | ORAL | Status: DC

## 2014-05-21 NOTE — ED Provider Notes (Signed)
CSN: [id number]     Arrival date & time 05/21/14  1200 History   First MD Initiated Contact with Patient 05/21/14 1202     Chief Complaint  Patient presents with  . Medical Clearance  . Fatigue  . Insomnia      HPI Patient has a history of bipolar disorder.  [name] reports that she has not been compliant with her medications and for the last several days has not been sleeping and acting bizarre.  He states she's been having hallucinations.  She's been speaking the fingers that aren't there.  She is very nervous at this time during the examination.  History of hospitalizations at behavioral health as well.   Past Medical History  Diagnosis Date  . Pulmonary emboli   . Schizophrenia   . Anxiety   . Depression   . PTSD (post-traumatic stress disorder)   . Cancer     pt denies ever having Any cancer  . Uterus cancer    Past Surgical History  Procedure Laterality Date  . No past surgeries    . Uterine fibroid surgery      uterine cancer  . Multiple extractions with alveoloplasty N/A 06/08/2013    Procedure: Dorma Russell;  Surgeon: Gae Bon, DDS;  Location: [facility];  Service: Oral Surgery;  Laterality: N/A;   Family History  Problem Relation Age of Onset  . Schizophrenia Neg Hx   . Cancer Other    History  Substance Use Topics  . Smoking status: Current Every Day Smoker -- 2.50 packs/day for 15 years    Types: Cigarettes  . Smokeless tobacco: Never Used  . Alcohol Use: Yes     Comment: once montly   OB History    No data available     Review of Systems  All other systems reviewed and are negative.     Allergies  Acetaminophen and Darvocet  Home Medications   Prior to Admission medications   Medication Sig Start Date End Date Taking? Authorizing Provider  benztropine (COGENTIN) 0.5 MG tablet Take 1 tablet (0.5 mg total) by mouth 2 (two) times daily. For involuntary movements induced by medications 01/07/14   Janett Labella, NP   cyclobenzaprine (FLEXERIL) 10 MG tablet Take 1 tablet (10 mg total) by mouth 3 (three) times daily as needed for muscle spasms. For muscle spasms 01/07/14   [facility], NP  hydrOXYzine (ATARAX/VISTARIL) 25 MG tablet Take 1 tablet (25 mg total) by mouth 3 (three) times daily as needed for anxiety. For anxiety 01/07/14   Janett Labella, NP  lamoTRIgine (LAMICTAL) 25 MG tablet Take 1 tablet (25 mg total) by mouth daily. For mood stabilization 01/07/14   [facility], NP  OLANZapine zydis (ZYPREXA) 10 MG disintegrating tablet Take 1 tablet (10 mg total) by mouth daily. For mood stabilization.  Take in the am 01/07/14   [facility], NP  OLANZapine zydis (ZYPREXA) 15 MG disintegrating tablet Take 1 tablet (15 mg total) by mouth at bedtime. For Mood stabilization. 01/07/14   [facility], NP  sertraline (ZOLOFT) 50 MG tablet Take 1 tablet (50 mg total) by mouth daily. For depression 01/07/14   Janett Labella, NP  traZODone (DESYREL) 100 MG tablet Take 1 tablet (100 mg total) by mouth at bedtime. For insomnia 01/07/14   Janett Labella, NP  warfarin (COUMADIN) 2.5 MG tablet Take 1 tablet (2.5 mg total) by mouth daily. For history of blood clot.  Please take 1  tablet (2.5 mg) on Wednesday and Sunday. 01/07/14   Janett Labella, NP  warfarin (COUMADIN) 5 MG tablet Take 1 tablet (5 mg total) by mouth daily. For history of blood clot.  Please take 1 tablet (5mg ) on Monday, Tuesday, Thursday, Friday and Saturday 01/07/14   Janett Labella, NP   BP 148/87 mmHg  Pulse 114  Temp(Src) 97.8 F (36.6 C) (Oral)  Resp 19  SpO2 94% Physical Exam  Constitutional: She is oriented to person, place, and time. She appears well-developed and well-nourished.  Anxious appearing  HENT:  Head: Normocephalic and atraumatic.  Eyes: EOM are normal.  Neck: Normal range of motion.  Cardiovascular: Regular rhythm and normal heart sounds.   Tachycardia  Pulmonary/Chest: Effort normal and  breath sounds normal. No respiratory distress. She has no wheezes.  Abdominal: Soft. She exhibits no distension. There is no tenderness.  Musculoskeletal: Normal range of motion.  Neurological: She is alert and oriented to person, place, and time.  Skin: Skin is warm and dry.  Psychiatric: Her mood appears anxious. Her speech is delayed. She is actively hallucinating. Thought content is paranoid. Cognition and memory are impaired. She expresses no homicidal and no suicidal ideation. She is inattentive.  Nursing note and vitals reviewed.   ED Course  Procedures (including critical care time) Labs Review Labs Reviewed - No data to display  Imaging Review No results found.   EKG Interpretation None      MDM   Final diagnoses:  None    We'll follow up on labs and INR.  We will restart home medications.  TTS to evaluate.  Patient will need inpatient psychiatric stabilization.  She is psychotic at this time    Hoy Morn, MD 05/21/14 1255

## 2014-05-21 NOTE — ED Notes (Addendum)
Pt's visitor states that when her INR gets off her sodium depletes and pt gets very weak.  Pt been getting worse over past couple of days. Pt has been under a lot of pressure and stress per visitor.  Pt answered yes to having thoughts of hurting herself or others, but wont specify which one. Pt has PTSD and other extensive. Pt trying to smoke cigarette while being triaged, informed ot she cant in the hospital. Pt hasnt been eating or sleeping.  Now pt denying wanting to harm herself or others.

## 2014-05-21 NOTE — ED Notes (Signed)
Pt med list updated. Pharmacist to speak with Dr. Venora Maples about new updated meds. Pt not currently taking cogentin, zyprexa and zoloft.
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
## 2014-05-21 NOTE — ED Notes (Addendum)
Pt belongings searched by security. One patient belongings bag full of clothes, a second belongings bag with patients purse. Purse is full of items. Valuables locked up by security money ( 6 $20, 1 $10, 2 $5, 4 $1), 1 debit card ending in 2859, 1 black wallet and contents, 1 gold colored bracelet, 1 gold colored chain with virgin mary pendent. Placed in locker 38.

## 2014-05-21 NOTE — ED Notes (Signed)
Patient states "blood clots are coming through the walls".Support and redirection given. PRN medications ordered and administered.

## 2014-05-21 NOTE — ED Notes (Signed)
Pt threw potassium in trash states "I refuse this medicine, I don't need it". Explained to patient her potassium level is low and patient still refused potassium. Dr. Venora Maples made aware, states patient is ok to still go to pysch. MD states medically stable. States she needs to get he pysch meds adjusted and then she will be able to take potassium appropriately.

## 2014-05-21 NOTE — Progress Notes (Signed)
ANTICOAGULATION CONSULT NOTE - Initial Consult  Pharmacy Consult for Warfarin Indication: History of PE  Allergies  Allergen Reactions  . Acetaminophen     Unknown reaction   . Darvocet [Propoxyphene N-Acetaminophen] Nausea And Vomiting and Other (See Comments)    Shaking     Patient Measurements:    Vital Signs: Temp: 98.3 F (36.8 C) (02/19 1352) Temp Source: Oral (02/19 1352) BP: 149/75 mmHg (02/19 1352) Pulse Rate: 114 (02/19 1212)  Labs:  Recent Labs  05/21/14 1256  HGB 15.9*  HCT 45.2  PLT 396  LABPROT 26.6*  INR 2.43*  CREATININE 1.08    CrCl cannot be calculated (Unknown ideal weight.).   Medical History: Past Medical History  Diagnosis Date  . Pulmonary emboli   . Schizophrenia   . Anxiety   . Depression   . PTSD (post-traumatic stress disorder)   . Cancer     pt denies ever having Any cancer  . Uterus cancer     Medications:  Scheduled:  . benztropine  0.5 mg Oral BID  . lamoTRIgine  25 mg Oral Daily  . nicotine  21 mg Transdermal Daily  . OLANZapine zydis  10 mg Oral Daily  . OLANZapine zydis  15 mg Oral QHS  . potassium chloride  20 mEq Oral Daily  . sertraline  50 mg Oral Daily   Infusions:   PRN: alum & mag hydroxide-simeth, hydrOXYzine, ibuprofen, ondansetron, traZODone  Assessment: 44 yo female with bipolar disorder presents for medical clearance. 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   Home dosage reported as 5mg  daily except 7.5mg  on sundays - last dose taken 2/18  INR therapeutic today (2.43)  CBC within normal limits  No bleeding reported   Goal of Therapy:  INR 2-3   Plan:   Warfarin 5mg  PO x 1 today at 18:00  Daily PT/INR  Peggyann Juba, PharmD, BCPS Pager: [phone] 05/21/2014,2:41 PM

## 2014-05-21 NOTE — ED Notes (Signed)
Pt speaking to psych on telepsych computer.

## 2014-05-21 NOTE — Progress Notes (Signed)
DR. Arlee Muslim @ [facility] [phone] is pcp

## 2014-05-22 DIAGNOSIS — F258 Other schizoaffective disorders: Secondary | ICD-10-CM

## 2014-05-22 LAB — BASIC METABOLIC PANEL
ANION GAP: 11 (ref 5–15)
BUN: 9 mg/dL (ref 6–23)
CALCIUM: 9.1 mg/dL (ref 8.4–10.5)
CO2: 26 mmol/L (ref 19–32)
Chloride: 101 mmol/L (ref 96–112)
Creatinine, Ser: 0.89 mg/dL (ref 0.50–1.10)
GFR, EST NON AFRICAN AMERICAN: 78 mL/min — AB (ref >90)
GLUCOSE: 171 mg/dL — AB (ref 70–99)
POTASSIUM: 3.2 mmol/L — AB (ref 3.5–5.1)
Sodium: 138 mmol/L (ref 135–145)

## 2014-05-22 LAB — PROTIME-INR
INR: 2.37 — AB (ref 0.00–1.49)
Prothrombin Time: 26.1 seconds — ABNORMAL HIGH (ref 11.6–15.2)

## 2014-05-22 MED ORDER — WHITE PETROLATUM GEL
Status: DC | PRN
  Administered 2014-05-22: 13:00:00 via TOPICAL
  Filled 2014-05-22 (×2): qty 5

## 2014-05-22 MED ORDER — LORAZEPAM 1 MG PO TABS: 1.0000 mg | ORAL_TABLET | Freq: Three times a day (TID) | ORAL | Status: DC | PRN

## 2014-05-22 MED ORDER — WARFARIN SODIUM 5 MG PO TABS
5.0000 mg | ORAL_TABLET | Freq: Once | ORAL | Status: AC
  Administered 2014-05-22: 5 mg via ORAL
  Filled 2014-05-22: qty 1

## 2014-05-22 MED ORDER — LORAZEPAM 1 MG PO TABS
1.0000 mg | ORAL_TABLET | Freq: Three times a day (TID) | ORAL | Status: DC | PRN
  Administered 2014-05-22 – 2014-05-23 (×3): 1 mg via ORAL
  Filled 2014-05-22 (×3): qty 1

## 2014-05-22 NOTE — ED Notes (Addendum)
Continuing to encouraged PO fluids/juice.  Pt up in [place] walking, talking to self. Pt ate a banana while walking

## 2014-05-22 NOTE — ED Notes (Signed)
Up in [place] pacing, talking to self, encouraged to drink juice.

## 2014-05-22 NOTE — Progress Notes (Signed)
ANTICOAGULATION CONSULT NOTE - Follow-up Consult  Pharmacy Consult for Warfarin Indication: History of PE  Allergies  Allergen Reactions  . Acetaminophen     Unknown reaction   . Darvocet [Propoxyphene N-Acetaminophen] Nausea And Vomiting and Other (See Comments)    Shaking     Patient Measurements:    Vital Signs: Temp: 98.1 F (36.7 C) (02/20 1313) Temp Source: Oral (02/20 1313) BP: 113/87 mmHg (02/20 1255) Pulse Rate: 120 (02/20 1255)  Labs:  Recent Labs  05/21/14 1256 05/22/14 1453  HGB 15.9*  --   HCT 45.2  --   PLT 396  --   LABPROT 26.6* 26.1*  INR 2.43* 2.37*  CREATININE 1.08 0.89    CrCl cannot be calculated (Unknown ideal weight.).   Medical History: Past Medical History  Diagnosis Date  . Pulmonary emboli   . Schizophrenia   . Anxiety   . Depression   . PTSD (post-traumatic stress disorder)   . Cancer     pt denies ever having Any cancer  . Uterus cancer     Medications:  Scheduled:  . benztropine  0.5 mg Oral BID  . lamoTRIgine  25 mg Oral Daily  . nicotine  21 mg Transdermal Daily  . OLANZapine zydis  10 mg Oral Daily  . OLANZapine zydis  15 mg Oral QHS  . potassium chloride  20 mEq Oral Daily  . sertraline  50 mg Oral Daily  . warfarin  5 mg Oral ONCE-1800  . Warfarin - Pharmacist Dosing Inpatient   Does not apply q1800   Infusions:   PRN: alum & mag hydroxide-simeth, hydrOXYzine, ibuprofen, LORazepam, ondansetron, traZODone, white petrolatum  Assessment: 44 yo female with bipolar disorder presents for medical clearance. 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   Home dosage reported as 5mg  daily except 7.5mg  on sundays - last dose taken 2/18  2/20:  Warfarin dose not given last night -unclear why RN held dose.  INR remains therapeutic.   CBC within normal limits  No bleeding reported   Goal of Therapy:  INR 2-3   Plan:   Warfarin 5mg  PO x 1 tonight  Daily PT/INR  Ralene Bathe, PharmD, BCPS 05/22/2014, 4:44  PM  Pager: [phone]

## 2014-05-22 NOTE — ED Notes (Signed)
Pt refused vitals 

## 2014-05-22 NOTE — ED Notes (Signed)
Has drank a cup of orange aide, PO fluids encouraged, repeat Bmet w/ INR blood draw [place] NP

## 2014-05-22 NOTE — ED Notes (Signed)
Pt up in [place] walking/talking to self

## 2014-05-22 NOTE — ED Notes (Addendum)
Pt up in the [place] pacing, talking to self, grimicing facial gestures.  Pt declined PO fluids, VS and to take medications.  Pt sts "I just woke up" and she needs 15-20 mins to wake up. Pt declined breakfast

## 2014-05-22 NOTE — ED Notes (Signed)
Dr Zenia Resides updated

## 2014-05-22 NOTE — ED Notes (Signed)
Up to the bathroom 

## 2014-05-22 NOTE — ED Notes (Signed)
Report received from [facility]. Pt. Alert in no distress  walking around unit.  Pt. Instructed to come to me with problems or concerns.Will continue to monitor for safety via security cameras and Q 15 minute checks.

## 2014-05-22 NOTE — BHH Counselor (Signed)
[name], [facility] at [facility], confirms adult unit is currently at capacity. Contacted the following facilities for placement:  BED AVAILABLE, FAXED CLINICAL INFORMATION: [facility], per [facility], per [facility], per [facility], per [place]  AT CAPACITY: [facility], per [facility], per [facility], per [facility], per [facility], per [facility], per [facility], per [facility], per [facility], per [facility], [facility], per [facility], per [facility], per [facility], per [facility], per [place] [place]  NO RESPONSE: [facility]   [place], [place], [facility] Triage Specialist [phone]
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
## 2014-05-22 NOTE — ED Notes (Signed)
Up [place] walking, continuing to encouraged PO fluids, but has not drunk much, did eat approx 1/3 of banana.

## 2014-05-22 NOTE — ED Notes (Signed)
Reginold Agent NP an Dr Aneta Mins into see
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
## 2014-05-22 NOTE — ED Notes (Signed)
Pt declined to have bp/temp taken

## 2014-05-22 NOTE — ED Notes (Signed)
Walking in [place], eating banana

## 2014-05-22 NOTE — ED Notes (Addendum)
Pt removed her bottom teeth, put them in a denture cup handing them to this writer, taking the name label off the cup,  and sts that I could take them that they "are not my teeth I came in with."  Dentures placed in pt's locker.

## 2014-05-22 NOTE — ED Notes (Signed)
Up in [place] walking/pacing, occasionally nibbling on fries from lunch

## 2014-05-22 NOTE — ED Notes (Signed)
Patient is sleeping at this time.
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
## 2014-05-22 NOTE — ED Notes (Signed)
Pt up in [place] pacing,talking to self.  Pt resistant to talking medications, but did agree to take some of the meds.  Pt encouraged to drink fluids -OJ given-, support given

## 2014-05-22 NOTE — ED Notes (Signed)
No change, still walking in the [place] talking to self , pace slightly slower

## 2014-05-22 NOTE — ED Notes (Signed)
Up in [place] walking./talking to her self.  Pt declined to allow BP and pulse to be taken.

## 2014-05-22 NOTE — ED Notes (Signed)
Pt. Made aware that we still need urine sample.
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
## 2014-05-23 LAB — RAPID URINE DRUG SCREEN, HOSP PERFORMED
Amphetamines: NOT DETECTED
BARBITURATES: NOT DETECTED
Benzodiazepines: POSITIVE — AB
COCAINE: NOT DETECTED
Opiates: NOT DETECTED
Tetrahydrocannabinol: POSITIVE — AB

## 2014-05-23 LAB — PREGNANCY, URINE: PREG TEST UR: NEGATIVE

## 2014-05-23 LAB — PROTIME-INR
INR: 2.58 — AB (ref 0.00–1.49)
PROTHROMBIN TIME: 27.9 s — AB (ref 11.6–15.2)

## 2014-05-23 MED ORDER — TRAMADOL HCL 50 MG PO TABS
50.0000 mg | ORAL_TABLET | Freq: Four times a day (QID) | ORAL | Status: DC | PRN
  Administered 2014-05-23: 50 mg via ORAL
  Filled 2014-05-23: qty 1

## 2014-05-23 MED ORDER — WARFARIN SODIUM 7.5 MG PO TABS
7.5000 mg | ORAL_TABLET | Freq: Once | ORAL | Status: AC
  Administered 2014-05-23: 7.5 mg via ORAL
  Filled 2014-05-23: qty 1

## 2014-05-23 NOTE — Progress Notes (Signed)
ANTICOAGULATION CONSULT NOTE - Follow-up Consult  Pharmacy Consult for Warfarin Indication: History of PE  Allergies  Allergen Reactions  . Acetaminophen     Unknown reaction   . Darvocet [Propoxyphene N-Acetaminophen] Nausea And Vomiting and Other (See Comments)    Shaking     Patient Measurements:    Vital Signs: Temp: 97.8 F (36.6 C) (02/21 1213) Temp Source: Oral (02/21 1213) BP: 121/80 mmHg (02/21 1213) Pulse Rate: 98 (02/21 1213)  Labs:  Recent Labs  05/21/14 1256 05/22/14 1453 05/23/14 0500  HGB 15.9*  --   --   HCT 45.2  --   --   PLT 396  --   --   LABPROT 26.6* 26.1* 27.9*  INR 2.43* 2.37* 2.58*  CREATININE 1.08 0.89  --     CrCl cannot be calculated (Unknown ideal weight.).   Medical History: Past Medical History  Diagnosis Date  . Pulmonary emboli   . Schizophrenia   . Anxiety   . Depression   . PTSD (post-traumatic stress disorder)   . Cancer     pt denies ever having Any cancer  . Uterus cancer     Medications:  Scheduled:  . benztropine  0.5 mg Oral BID  . lamoTRIgine  25 mg Oral Daily  . nicotine  21 mg Transdermal Daily  . OLANZapine zydis  10 mg Oral Daily  . OLANZapine zydis  15 mg Oral QHS  . potassium chloride  20 mEq Oral Daily  . sertraline  50 mg Oral Daily  . Warfarin - Pharmacist Dosing Inpatient   Does not apply q1800   Infusions:   PRN: alum & mag hydroxide-simeth, hydrOXYzine, ibuprofen, LORazepam, ondansetron, traMADol, traZODone, white petrolatum  Assessment: 44 yo female with bipolar disorder presents for medical clearance. 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   Home dosage reported as 5mg  daily except 7.5mg  on sundays - last dose taken 2/18  2/21:  Warfarin held 2/19 by RN -unclear reason.  INR today therapeutic   Last CBC on 2/19, no issues  No bleeding reported  Goal of Therapy:  INR 2-3   Plan:   Warfarin 7.5mg  PO x 1 tonight per home dose schedule  Daily PT/INR  Ralene Bathe, PharmD,  BCPS 05/23/2014, 2:52 PM  Pager: [phone]

## 2014-05-23 NOTE — ED Notes (Signed)
PO fluids encouraged, medicated for back/leg pain, eating bacon.  Pleasant, but is still talking to her self

## 2014-05-23 NOTE — ED Notes (Addendum)
Sitting quielty in room, reports that she is pregnant "with two".  Pt reports back/leg pain.  Pt reports that she can not take tylenol or ibuprofen.  Will inform NP/MD.
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
## 2014-05-23 NOTE — ED Notes (Signed)
Up to the bathroom 

## 2014-05-23 NOTE — ED Notes (Signed)
Dr Aneta Mins and josephine into see

## 2014-05-23 NOTE — ED Notes (Signed)
Pt declined to take the visteril reporting that she does not take it at home andher family dr told her she did not have to.

## 2014-05-23 NOTE — ED Notes (Signed)
Sitting quietly in room talking to self

## 2014-05-23 NOTE — ED Notes (Addendum)
Sitting quielty in chair, pleasant, talking to self at times. PO fluids encouraged
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
## 2014-05-23 NOTE — ED Notes (Signed)
Pt took her engagement ring off and gave it to another pt.  When asked why she  Reported that it was not her ring.  Pt initially refused to take her wedding ring off, but then agreed.  Rings will be locked up w/ security.

## 2014-05-23 NOTE — ED Notes (Signed)
Pt. Still pacing and anxious.

## 2014-05-23 NOTE — ED Notes (Signed)
Up in the [place], talking to self
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
## 2014-05-23 NOTE — ED Notes (Signed)
Feeling better, reports back/leg pain has improved, eating lunch.

## 2014-05-23 NOTE — ED Notes (Signed)
Up to the desk, more coherent, able to have a conversation.  Pt reports that she is feeling better and wants to go home. Pt given shower shoes, reports that she went to take a shower but thought it was too slippery for her to do.  Support given, ate approx 1/2 of applesauce.

## 2014-05-23 NOTE — ED Notes (Signed)
Sitting in chair, rocking

## 2014-05-23 NOTE — ED Notes (Signed)
[facility] pharmacist contacted regarding pain control at request of PA.Tramadol  interference with comadin minimal, low dose ibuprofen (400mg  q8 hrs) OK to give.  Recommend  PPI-protonix/prilosec to minimize potential GI problems.  Josephine NP informed.

## 2014-05-23 NOTE — BH Assessment (Addendum)
TC to [place] at [facility]. He says he doesn't have a referral for the patient from TTS. Kasandra Knudsen reports that they only have low acuity beds currently and he asks that referral not be faxed again. He says to call back tomorrow to check on bed availability.  TC to [place] at [facility]. She reports pt's referral hasn't been reviewed as they don't have any available beds. She reports they won't take a look at pt's referral until sometime tomorrow. Writer left voicemail for [facility] admissions asking TTS be called back.  Roderic Palau from [facility] called back and sts they didn't receive a referral for pt. He told Probation officer to fax referral to [phone]. Writer faxed referral.  Arnold Long, [place] Assessment Counselor

## 2014-05-24 LAB — PROTIME-INR
INR: 3.17 — ABNORMAL HIGH (ref 0.00–1.49)
PROTHROMBIN TIME: 32.7 s — AB (ref 11.6–15.2)

## 2014-05-24 LAB — POTASSIUM: POTASSIUM: 3.2 mmol/L — AB (ref 3.5–5.1)

## 2014-05-24 NOTE — ED Notes (Signed)
Patient discharged to home.  All belongings returned and signed for.  Denies suicidal ideation or thought of hurting others.  Left the unit ambulatory.

## 2014-05-24 NOTE — Discharge Instructions (Addendum)
For your ongoing behavioral health needs, you are advised to follow up with [facility].  [name] sees new patients through their walk-in clinic.  Walk-in hours are Monday - Friday, 8:00 am - 3:00 pm.  Walk-in patients are seen on a first come, first served basis.  Try to arrive as early as possible for the best chance of being seen the same day.       [facility]      [address]      [place], [place] [address]      [phone]

## 2014-05-24 NOTE — Progress Notes (Signed)
ANTICOAGULATION CONSULT NOTE - Follow-up Consult  Pharmacy Consult for Warfarin Indication: History of PE  Allergies  Allergen Reactions  . Acetaminophen     Unknown reaction   . Darvocet [Propoxyphene N-Acetaminophen] Nausea And Vomiting and Other (See Comments)    Shaking     Patient Measurements:    Vital Signs: Temp: 98.1 F (36.7 C) (02/22 0558) Temp Source: Oral (02/22 0558) BP: 129/84 mmHg (02/22 0558) Pulse Rate: 93 (02/22 0558)  Labs:  Recent Labs  05/21/14 1256 05/22/14 1453 05/23/14 0500 05/24/14 0534  HGB 15.9*  --   --   --   HCT 45.2  --   --   --   PLT 396  --   --   --   LABPROT 26.6* 26.1* 27.9* 32.7*  INR 2.43* 2.37* 2.58* 3.17*  CREATININE 1.08 0.89  --   --     CrCl cannot be calculated (Unknown ideal weight.).   Medical History: Past Medical History  Diagnosis Date  . Pulmonary emboli   . Schizophrenia   . Anxiety   . Depression   . PTSD (post-traumatic stress disorder)   . Cancer     pt denies ever having Any cancer  . Uterus cancer     Medications:  Scheduled:  . benztropine  0.5 mg Oral BID  . lamoTRIgine  25 mg Oral Daily  . nicotine  21 mg Transdermal Daily  . OLANZapine zydis  10 mg Oral Daily  . OLANZapine zydis  15 mg Oral QHS  . potassium chloride  20 mEq Oral Daily  . sertraline  50 mg Oral Daily  . Warfarin - Pharmacist Dosing Inpatient   Does not apply q1800   Infusions:   PRN: alum & mag hydroxide-simeth, hydrOXYzine, ibuprofen, LORazepam, ondansetron, traMADol, traZODone, white petrolatum  Assessment: 44 yo female with bipolar disorder presents for medical clearance. 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   Home dosage reported as 5mg  daily except 7.5mg  on sundays - last dose taken 2/18  Today, 05/24/2014:  INR = 3.17 (supratherapeutic)  Warfarin held 2/19 by RN -unclear reason.   Last CBC on 2/19, no issues  No bleeding reported  Goal of Therapy:  INR 2-3   Plan:   No warfarin tonight as INR  has risen to >3 with large rate of rise overnight  Daily PT/INR  Doreene Eland, PharmD, BCPS.   Pager: [phone] 05/24/2014, 9:31 AM

## 2014-05-24 NOTE — BH Assessment (Signed)
[facility] Assessment Progress Note  Per Corena Pilgrim, MD this pt does not require psychiatric hospitalization at this time.  She is to be discharged with referral information for [facility].  This information has been included in pt's discharge instructions.  Pt's nurse, Dawnaly, has been notified.  Jalene Mullet, MA Triage Specialist 05/24/2014 @ 09:36

## 2014-05-24 NOTE — BHH Counselor (Signed)
Contacted the following facilities for placement:  No appropriate beds currently available.  AT CAPACITY: [facility], per Wandra Feinstein, per [employer], per [facility], per [facility], per [facility], per [facility], per [facility], [facility], [facility], per [facility], per [facility], per [facility], per [facility], per [facility], per [facility] RESPONSE: [facility]   [address] [place], [place], [facility] Triage Specialist [phone]

## 2014-05-24 NOTE — BHH Suicide Risk Assessment (Signed)
Suicide Risk Assessment  Discharge Assessment   [facility] Discharge Suicide Risk Assessment   Demographic Factors:  Caucasian  Total Time spent with patient: 30 minutes  Musculoskeletal: Strength & Muscle Tone: within normal limits Gait & Station: normal Patient leans: N/A  Psychiatric Specialty Exam:     Blood pressure 141/82, pulse 110, temperature 98.2 F (36.8 C), temperature source Oral, resp. rate 18, SpO2 100 %.There is no weight on file to calculate BMI.  General Appearance: Casual  Eye Contact::  Good  Speech:  Normal Rate  Volume:  Normal  Mood:  Euthymic  Affect:  Congruent  Thought Process:  Coherent  Orientation:  Full (Time, Place, and Person)  Thought Content:  WDL  Suicidal Thoughts:  No  Homicidal Thoughts:  No  Memory:  Immediate;   Good Recent;   Good Remote;   Good  Judgement:  Fair  Insight:  Fair  Psychomotor Activity:  Normal  Concentration:  Good  Recall:  Good  Fund of Knowledge:Fair  Language: Good  Akathisia:  No  Handed:  Right  AIMS (if indicated):     Assets:  Housing Physical Health Resilience Social Support Transportation  ADL's:  Intact  Cognition: WNL  Sleep:          Has this patient used any form of tobacco in the last 30 days? (Cigarettes, Smokeless Tobacco, Cigars, and/or Pipes) Yes, A prescription for an FDA-approved tobacco cessation medication was offered at discharge and the patient refused  Mental Status Per Nursing Assessment::   On Admission:   Paranoia  Current Mental Status by Physician: NA  Loss Factors: NA  Historical Factors: NA  Risk Reduction Factors:   Sense of responsibility to family, Living with another person, especially a relative and Positive social support  Continued Clinical Symptoms:  None  Cognitive Features That Contribute To Risk:  None    Suicide Risk:  Minimal: No identifiable suicidal ideation.  Patients presenting with no risk factors but with morbid ruminations; may be  classified as minimal risk based on the severity of the depressive symptoms  Principal Problem: Schizoaffective disorder-chronic with exacerbation Discharge Diagnoses:  Patient Active Problem List   Diagnosis Date Noted  . Schizoaffective disorder, depressive type [F25.1] 10/19/2013    Priority: High  . Schizoaffective disorder-chronic with exacerbation [F25.8] 10/18/2013    Priority: High  . Substance dependence [F19.20] 10/18/2013    Priority: High  . Post traumatic stress disorder (PTSD) [F43.10] 09/20/2011    Priority: High  . Schizoaffective disorder [F25.9] 12/28/2013  . AKI (acute kidney injury) [N17.9] 10/11/2013  . Pulmonary emboli [I26.99] 08/18/2011  . Hepatic lesion [K76.89] 12/25/2010  . Amenorrhea [N91.2] 12/25/2010  . 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 Thrombus [I82.90] 11/22/2010  . Esophagitis [530.1] 11/22/2010  . Dizziness [R42] 11/22/2010  . Leukocytosis [D72.829] 11/13/2010  . Raynaud's syndrome [I73.00] 11/13/2010  . Tachycardia [R00.0] 11/13/2010  . Tobacco abuse [Z72.0] 11/13/2010  . Marijuana abuse [F12.10] 11/13/2010      Plan Of Care/Follow-up recommendations:  Activity:  as tolerated Diet:  heart healthy diet  Is patient on multiple antipsychotic therapies at discharge:  No   Has Patient had three or more failed trials of antipsychotic monotherapy by history:  No  Recommended Plan for Multiple Antipsychotic Therapies: NA    LORD, JAMISON, PMH-NP 05/24/2014, 1:21 PM
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
## 2014-05-30 ENCOUNTER — Emergency Department (HOSPITAL_COMMUNITY)
Admission: EM | Admit: 2014-05-30 | Discharge: 2014-06-03 | Disposition: A | Discharge status: Home / Self Care | Payer: Medicaid Other | Attending: Emergency Medicine | Admitting: Emergency Medicine

## 2014-05-30 DIAGNOSIS — Z8541 Personal history of malignant neoplasm of cervix uteri: Secondary | ICD-10-CM | POA: Diagnosis not present

## 2014-05-30 DIAGNOSIS — Z720 Tobacco use: Secondary | ICD-10-CM | POA: Diagnosis not present

## 2014-05-30 DIAGNOSIS — F4310 Post-traumatic stress disorder, unspecified: Secondary | ICD-10-CM | POA: Diagnosis present

## 2014-05-30 DIAGNOSIS — F1410 Cocaine abuse, uncomplicated: Secondary | ICD-10-CM | POA: Diagnosis not present

## 2014-05-30 DIAGNOSIS — F209 Schizophrenia, unspecified: Secondary | ICD-10-CM | POA: Insufficient documentation

## 2014-05-30 DIAGNOSIS — F329 Major depressive disorder, single episode, unspecified: Secondary | ICD-10-CM | POA: Insufficient documentation

## 2014-05-30 DIAGNOSIS — Z79899 Other long term (current) drug therapy: Secondary | ICD-10-CM | POA: Insufficient documentation

## 2014-05-30 DIAGNOSIS — F258 Other schizoaffective disorders: Secondary | ICD-10-CM | POA: Diagnosis not present

## 2014-05-30 DIAGNOSIS — F29 Unspecified psychosis not due to a substance or known physiological condition: Secondary | ICD-10-CM | POA: Diagnosis not present

## 2014-05-30 DIAGNOSIS — Z3202 Encounter for pregnancy test, result negative: Secondary | ICD-10-CM | POA: Diagnosis not present

## 2014-05-30 DIAGNOSIS — Z7901 Long term (current) use of anticoagulants: Secondary | ICD-10-CM | POA: Insufficient documentation

## 2014-05-30 DIAGNOSIS — Z86711 Personal history of pulmonary embolism: Secondary | ICD-10-CM | POA: Diagnosis not present

## 2014-05-30 DIAGNOSIS — F1310 Sedative, hypnotic or anxiolytic abuse, uncomplicated: Secondary | ICD-10-CM | POA: Diagnosis not present

## 2014-05-30 DIAGNOSIS — F259 Schizoaffective disorder, unspecified: Secondary | ICD-10-CM | POA: Diagnosis present

## 2014-05-30 DIAGNOSIS — F419 Anxiety disorder, unspecified: Secondary | ICD-10-CM | POA: Diagnosis not present

## 2014-05-30 DIAGNOSIS — Z046 Encounter for general psychiatric examination, requested by authority: Secondary | ICD-10-CM | POA: Diagnosis present

## 2014-05-30 LAB — COMPREHENSIVE METABOLIC PANEL
ALBUMIN: 4.1 g/dL (ref 3.5–5.2)
ALT: 24 U/L (ref 0–35)
AST: 44 U/L — AB (ref 0–37)
Alkaline Phosphatase: 83 U/L (ref 39–117)
Anion gap: 9 (ref 5–15)
BUN: <5 mg/dL — ABNORMAL LOW (ref 6–23)
CO2: 30 mmol/L (ref 19–32)
Calcium: 9.2 mg/dL (ref 8.4–10.5)
Chloride: 98 mmol/L (ref 96–112)
Creatinine, Ser: 0.75 mg/dL (ref 0.50–1.10)
GFR calc Af Amer: >90 mL/min (ref >90)
Glucose, Bld: 102 mg/dL — ABNORMAL HIGH (ref 70–99)
Potassium: 2.8 mmol/L — ABNORMAL LOW (ref 3.5–5.1)
Sodium: 137 mmol/L (ref 135–145)
TOTAL PROTEIN: 7.9 g/dL (ref 6.0–8.3)
Total Bilirubin: 0.4 mg/dL (ref 0.3–1.2)

## 2014-05-30 LAB — CBC
HCT: 45.1 % (ref 36.0–46.0)
Hemoglobin: 15.5 g/dL — ABNORMAL HIGH (ref 12.0–15.0)
MCH: 33.2 pg (ref 26.0–34.0)
MCHC: 34.4 g/dL (ref 30.0–36.0)
MCV: 96.6 fL (ref 78.0–100.0)
Platelets: 498 10*3/uL — ABNORMAL HIGH (ref 150–400)
RBC: 4.67 MIL/uL (ref 3.87–5.11)
RDW: 13.9 % (ref 11.5–15.5)
WBC: 11.5 10*3/uL — ABNORMAL HIGH (ref 4.0–10.5)

## 2014-05-30 LAB — SALICYLATE LEVEL: Salicylate Lvl: <4 mg/dL (ref 2.8–20.0)

## 2014-05-30 LAB — ACETAMINOPHEN LEVEL: Acetaminophen (Tylenol), Serum: <10 ug/mL — ABNORMAL LOW (ref 10–30)

## 2014-05-30 LAB — PROTIME-INR
INR: 3.14 — ABNORMAL HIGH (ref 0.00–1.49)
PROTHROMBIN TIME: 32.5 s — AB (ref 11.6–15.2)

## 2014-05-30 LAB — ETHANOL: Alcohol, Ethyl (B): <5 mg/dL (ref 0–9)

## 2014-05-30 MED ORDER — NICOTINE 21 MG/24HR TD PT24
21.0000 mg | MEDICATED_PATCH | Freq: Once | TRANSDERMAL | Status: AC
  Administered 2014-05-30: 21 mg via TRANSDERMAL
  Filled 2014-05-30: qty 1

## 2014-05-30 MED ORDER — IBUPROFEN 200 MG PO TABS
600.0000 mg | ORAL_TABLET | Freq: Three times a day (TID) | ORAL | Status: DC | PRN
  Filled 2014-05-30: qty 3

## 2014-05-30 MED ORDER — POTASSIUM CHLORIDE CRYS ER 20 MEQ PO TBCR
40.0000 meq | EXTENDED_RELEASE_TABLET | Freq: Once | ORAL | Status: AC
  Administered 2014-05-30: 40 meq via ORAL
  Filled 2014-05-30: qty 2

## 2014-05-30 MED ORDER — LORAZEPAM 1 MG PO TABS
1.0000 mg | ORAL_TABLET | Freq: Three times a day (TID) | ORAL | Status: DC | PRN
  Administered 2014-05-30: 1 mg via ORAL
  Filled 2014-05-30 (×3): qty 1

## 2014-05-30 MED ORDER — ONDANSETRON HCL 4 MG PO TABS: 4.0000 mg | ORAL_TABLET | Freq: Three times a day (TID) | ORAL | Status: DC | PRN
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
## 2014-05-30 NOTE — ED Notes (Signed)
Bed: [facility] Expected date: 05/30/14 Expected time: 11:45 PM Means of arrival:  Comments: Hold for D Estep

## 2014-05-31 DIAGNOSIS — F29 Unspecified psychosis not due to a substance or known physiological condition: Secondary | ICD-10-CM

## 2014-05-31 LAB — RAPID URINE DRUG SCREEN, HOSP PERFORMED
AMPHETAMINES: NOT DETECTED
BARBITURATES: NOT DETECTED
Benzodiazepines: POSITIVE — AB
Cocaine: NOT DETECTED
Opiates: NOT DETECTED
TETRAHYDROCANNABINOL: POSITIVE — AB

## 2014-05-31 MED ORDER — WARFARIN - PHARMACIST DOSING INPATIENT: Freq: Every day | Status: DC

## 2014-05-31 NOTE — ED Notes (Signed)
Patient appears anxious, pacing. Cooperative and quiet. Denies SI, HI, AVH. Denies feelings of anxiety and depression.   Encouragement offered.  Q 15 safety checks continue.

## 2014-05-31 NOTE — Progress Notes (Signed)
CSW faxed patient referral to the following inpatient facilities with bed availability: [facility] [place], [place].  [facility], [place] Disposition staff 05/31/2014 6:48 PM

## 2014-05-31 NOTE — ED Notes (Signed)
Pt has silver colored necklace with dandelion charm in sterile cup in bag of personal belongings with black jacket. Pt visualized nurse putting necklace in bag. Cup is labeled.

## 2014-05-31 NOTE — ED Notes (Signed)
Pt requested prn for pain, but then refused stating she didn't want to be too sleepy. Will continue to monitor
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
## 2014-05-31 NOTE — BH Assessment (Signed)
[place] Assessment Progress Note  The following facilities have been contacted in an effort to place this pt, with results as noted:  Beds available, information faxed, decision pending:  [facility]  Medicaid patient, not eligible for referral to this facility:  [facility]  At capacity:  Oleta Mouse, [place] Triage Specialist 05/31/2014 @ 16:14

## 2014-05-31 NOTE — BH Assessment (Signed)
Assessment completed. Consulted Arlester Marker, NP who recommended that pt be evaluated by psychiatry in the morning. Charlann Lange, PA-C has been informed of the recommendation.

## 2014-05-31 NOTE — Progress Notes (Signed)
ANTICOAGULATION CONSULT NOTE - Initial Consult  Pharmacy Consult for Warfarin Indication: History of PE  Allergies  Allergen Reactions  . Acetaminophen     Unknown reaction   . Darvocet [Propoxyphene N-Acetaminophen] Nausea And Vomiting and Other (See Comments)    Shaking     Patient Measurements:     Vital Signs: Temp: 98.3 F (36.8 C) (02/29 1201) Temp Source: Oral (02/29 1201) BP: 117/74 mmHg (02/29 1201) Pulse Rate: 111 (02/29 1201)  Labs:  Recent Labs  05/30/14 2130 05/30/14 2135  HGB 15.5*  --   HCT 45.1  --   PLT 498*  --   LABPROT  --  32.5*  INR  --  3.14*  CREATININE 0.75  --     CrCl cannot be calculated (Unknown ideal weight.).   Medical History: Past Medical History  Diagnosis Date  . Pulmonary emboli   . Schizophrenia   . Anxiety   . Depression   . PTSD (post-traumatic stress disorder)   . Cancer     pt denies ever having Any cancer  . Uterus cancer     Medications:  Scheduled:  . nicotine  21 mg Transdermal Once  . Warfarin - Pharmacist Dosing Inpatient   Does not apply q1800   Infusions:   PRN: ibuprofen, LORazepam, ondansetron  Assessment: 44 yo female with bipolar disorder presents for medical clearance. 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   Takes chronic warfarin for hx PE.   Home dose reported as 5mg  daily and 7.5mg  on Sundays - last dose 2/28 and INR=3.14 on admission 2/28.  Goal of Therapy:  INR 2-3   Plan:   No warfarin tonight as INR was supratherapeutic last PM  Daily PT/INR  Peggyann Juba, PharmD, BCPS Pager: [phone] 05/31/2014,4:13 PM

## 2014-05-31 NOTE — BH Assessment (Signed)
Dr Lovena Le completed pt's First Opinion (Exam and Recommendation). Writer placed original in pt's chart and copy in IVC bin in TTS office in [place].  Arnold Long, [place] Therapeutic Triage Specialist

## 2014-05-31 NOTE — BHH Counselor (Addendum)
TC from [facility]. Their one female bed has been taken, so pt is declined d/t capacity.  Arnold Long, [place] Therapeutic Triage Specialist [phone]   Writer faxed referral to [place], [place] Therapeutic Triage Specialist [phone]

## 2014-06-01 LAB — BASIC METABOLIC PANEL
Anion gap: 10 (ref 5–15)
BUN: 8 mg/dL (ref 6–23)
CHLORIDE: 99 mmol/L (ref 96–112)
CO2: 29 mmol/L (ref 19–32)
CREATININE: 0.83 mg/dL (ref 0.50–1.10)
Calcium: 8.9 mg/dL (ref 8.4–10.5)
GFR calc Af Amer: >90 mL/min (ref >90)
GFR, EST NON AFRICAN AMERICAN: 85 mL/min — AB (ref >90)
GLUCOSE: 141 mg/dL — AB (ref 70–99)
Potassium: 3 mmol/L — ABNORMAL LOW (ref 3.5–5.1)
Sodium: 138 mmol/L (ref 135–145)

## 2014-06-01 LAB — PROTIME-INR
INR: 2.53 — ABNORMAL HIGH (ref 0.00–1.49)
PROTHROMBIN TIME: 27.5 s — AB (ref 11.6–15.2)

## 2014-06-01 MED ORDER — LORAZEPAM 2 MG/ML IJ SOLN
2.0000 mg | Freq: Once | INTRAMUSCULAR | Status: AC
  Administered 2014-06-01: 2 mg via INTRAMUSCULAR
  Filled 2014-06-01: qty 1

## 2014-06-01 MED ORDER — WARFARIN SODIUM 5 MG PO TABS
5.0000 mg | ORAL_TABLET | Freq: Once | ORAL | Status: AC
  Administered 2014-06-01: 5 mg via ORAL
  Filled 2014-06-01: qty 1

## 2014-06-01 MED ORDER — ZIPRASIDONE MESYLATE 20 MG IM SOLR
20.0000 mg | Freq: Once | INTRAMUSCULAR | Status: AC
  Administered 2014-06-01: 20 mg via INTRAMUSCULAR
  Filled 2014-06-01: qty 20

## 2014-06-01 MED ORDER — POTASSIUM CHLORIDE CRYS ER 20 MEQ PO TBCR
20.0000 meq | EXTENDED_RELEASE_TABLET | Freq: Two times a day (BID) | ORAL | Status: DC
  Administered 2014-06-01 – 2014-06-03 (×4): 20 meq via ORAL
  Filled 2014-06-01 (×4): qty 1

## 2014-06-01 MED ORDER — NICOTINE 21 MG/24HR TD PT24
21.0000 mg | MEDICATED_PATCH | Freq: Every day | TRANSDERMAL | Status: DC
  Administered 2014-06-01 – 2014-06-03 (×3): 21 mg via TRANSDERMAL
  Filled 2014-06-01 (×3): qty 1

## 2014-06-01 MED ORDER — DIPHENHYDRAMINE HCL 50 MG/ML IJ SOLN
50.0000 mg | Freq: Once | INTRAMUSCULAR | Status: AC
  Administered 2014-06-01: 50 mg via INTRAMUSCULAR
  Filled 2014-06-01: qty 1

## 2014-06-01 MED ORDER — POTASSIUM CHLORIDE CRYS ER 10 MEQ PO TBCR
10.0000 meq | EXTENDED_RELEASE_TABLET | Freq: Every day | ORAL | Status: DC
Start: 2014-06-01 — End: 2014-06-01
  Administered 2014-06-01: 10 meq via ORAL
  Filled 2014-06-01: qty 1

## 2014-06-01 NOTE — Progress Notes (Signed)
ANTICOAGULATION CONSULT NOTE - Follow Up  Pharmacy Consult for Warfarin Indication: History of PE  Allergies  Allergen Reactions  . Acetaminophen     Unknown reaction   . Darvocet [Propoxyphene N-Acetaminophen] Nausea And Vomiting and Other (See Comments)    Shaking     Patient Measurements:     Vital Signs: Temp: 98.4 F (36.9 C) (03/01 0620) Temp Source: Oral (03/01 0620) BP: 115/80 mmHg (03/01 0620) Pulse Rate: 104 (03/01 0620)  Labs:  Recent Labs  05/30/14 2130 05/30/14 2135 06/01/14 0610  HGB 15.5*  --   --   HCT 45.1  --   --   PLT 498*  --   --   LABPROT  --  32.5* 27.5*  INR  --  3.14* 2.53*  CREATININE 0.75  --   --     CrCl cannot be calculated (Unknown ideal weight.).   Medical History: Past Medical History  Diagnosis Date  . Pulmonary emboli   . Schizophrenia   . Anxiety   . Depression   . PTSD (post-traumatic stress disorder)   . Cancer     pt denies ever having Any cancer  . Uterus cancer     Medications:  Scheduled:  . Warfarin - Pharmacist Dosing Inpatient   Does not apply q1800   Infusions:   PRN: ibuprofen, LORazepam, ondansetron  Assessment: 44 yo female with bipolar disorder presents for medical clearance. 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   Takes chronic warfarin for hx PE.   Home dose reported as 5mg  daily and 7.5mg  on Sundays - last dose 2/28 and INR=3.14 on admission 2/28.  Today, 06/01/2014  INR therapeutic this AM (2.53)  No reported bleeding  Goal of Therapy:  INR 2-3   Plan:  1) Warfarin per home dose as above - 5mg  tonight 2) Daily INR   Adrian Saran, PharmD, BCPS Pager [phone] 06/01/2014 10:30 AM

## 2014-06-01 NOTE — BH Assessment (Addendum)
[place] Assessment Progress Note  The following facilities have been contacted today in an effort to place this pt, with results as noted:  Beds available, information faxed, decision pending:  [place]  Bed availability unknown but accepting referrals, information faxed:  [facility]  At capacity:  Tildon Husky [place], [place] Triage Specialist 06/01/2014 @ 15:20

## 2014-06-01 NOTE — ED Notes (Signed)
Phlebotomy called to collect protime-inr.

## 2014-06-01 NOTE — ED Notes (Signed)
Patient continues to decline Ativan. Continues to pace, appears to be responding to AVH. Patient states she does not want to drink water. Encouraged to drink water or gatorade at this hour. Encouraged to rest.  Q 15 safety checks continue.
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
## 2014-06-01 NOTE — ED Notes (Signed)
Patient alert. Calm, cooperative.  Given Coumadin, K+, sandwich.  Encouragement offered.  Q 15 safety checks continue

## 2014-06-01 NOTE — Progress Notes (Signed)
Pt referred to [name]. [name] authorization number [id number] from 06/01/2014 to 06/06/2014  [name] [phone]  ED CSW 06/01/2014 2:36 PM

## 2014-06-01 NOTE — Progress Notes (Signed)
CSW reached out to [facility] to confirm that clinicals were received. Also, CSW spoke with [name] of [place] and completed pt demographics.   [name] informed CSW that she would call back with any updates regarding pt.   [name] [phone] ED CSW 06/01/2014 3:45 PM

## 2014-06-02 ENCOUNTER — Emergency Department (HOSPITAL_COMMUNITY): Payer: Medicaid Other

## 2014-06-02 DIAGNOSIS — F258 Other schizoaffective disorders: Secondary | ICD-10-CM

## 2014-06-02 LAB — URINALYSIS, ROUTINE W REFLEX MICROSCOPIC
BILIRUBIN URINE: NEGATIVE
GLUCOSE, UA: NEGATIVE mg/dL
HGB URINE DIPSTICK: NEGATIVE
Ketones, ur: NEGATIVE mg/dL
Leukocytes, UA: NEGATIVE
NITRITE: NEGATIVE
PH: 6 (ref 5.0–8.0)
Protein, ur: NEGATIVE mg/dL
SPECIFIC GRAVITY, URINE: 1.011 (ref 1.005–1.030)
Urobilinogen, UA: 1 mg/dL (ref 0.0–1.0)

## 2014-06-02 LAB — PROTIME-INR
INR: 2.1 — ABNORMAL HIGH (ref 0.00–1.49)
Prothrombin Time: 23.7 seconds — ABNORMAL HIGH (ref 11.6–15.2)

## 2014-06-02 LAB — POTASSIUM: Potassium: 3.6 mmol/L (ref 3.5–5.1)

## 2014-06-02 LAB — PREGNANCY, URINE: Preg Test, Ur: NEGATIVE

## 2014-06-02 MED ORDER — HALOPERIDOL LACTATE 5 MG/ML IJ SOLN
5.0000 mg | Freq: Once | INTRAMUSCULAR | Status: AC
  Administered 2014-06-02: 5 mg via INTRAMUSCULAR
  Filled 2014-06-02: qty 1

## 2014-06-02 MED ORDER — WARFARIN SODIUM 5 MG PO TABS
5.0000 mg | ORAL_TABLET | Freq: Once | ORAL | Status: AC
  Administered 2014-06-02: 5 mg via ORAL
  Filled 2014-06-02: qty 1

## 2014-06-02 MED ORDER — DIPHENHYDRAMINE HCL 50 MG/ML IJ SOLN
50.0000 mg | Freq: Once | INTRAMUSCULAR | Status: AC
  Administered 2014-06-02: 50 mg via INTRAMUSCULAR
  Filled 2014-06-02: qty 1

## 2014-06-02 NOTE — Progress Notes (Signed)
Opal Sidles of [facility] reached out to [place] and requested the following labs : CBC, INR, EKG, and Chest X-ray.  CSW notified EDP, who states that he will order the labs.  Willette Brace [phone] ED CSW 06/02/2014 10:52 PM

## 2014-06-02 NOTE — ED Notes (Signed)
Pt refused EKG.

## 2014-06-02 NOTE — Progress Notes (Signed)
ANTICOAGULATION CONSULT NOTE - Follow Up  Pharmacy Consult for Warfarin Indication: History of PE  Allergies  Allergen Reactions  . Acetaminophen     Unknown reaction   . Darvocet [Propoxyphene N-Acetaminophen] Nausea And Vomiting and Other (See Comments)    Shaking     Patient Measurements:     Vital Signs:    Labs:  Recent Labs  05/30/14 2130 05/30/14 2135 06/01/14 0610 06/01/14 1230 06/02/14 0945  HGB 15.5*  --   --   --   --   HCT 45.1  --   --   --   --   PLT 498*  --   --   --   --   LABPROT  --  32.5* 27.5*  --  23.7*  INR  --  3.14* 2.53*  --  2.10*  CREATININE 0.75  --   --  0.83  --     CrCl cannot be calculated (Unknown ideal weight.).   Medical History: Past Medical History  Diagnosis Date  . Pulmonary emboli   . Schizophrenia   . Anxiety   . Depression   . PTSD (post-traumatic stress disorder)   . Cancer     pt denies ever having Any cancer  . Uterus cancer     Medications:  Scheduled:  . nicotine  21 mg Transdermal Daily  . potassium chloride  20 mEq Oral BID  . Warfarin - Pharmacist Dosing Inpatient   Does not apply q1800   Infusions:   PRN: ibuprofen, LORazepam, ondansetron  Assessment: 44 yo female with bipolar disorder presents for medical clearance. 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   Home dose reported as 5mg  daily and 7.5mg  on Sundays - last dose 2/28 and INR=3.14 on admission 2/28.  Today, 06/02/2014  INR therapeutic after warfarin was resumed 3/1 (see MAR for doses)  No reported bleeding  Diet: regular  Drug-drug interactions: concomitant ibuprofen - may increase bleeding risk through antiplatelet and GI effects.  Goal of Therapy:  INR 2-3   Plan:  1) Warfarin 5 mg PO x 1 today as per home dosage. 2) Daily INR  Clayburn Pert, PharmD, BCPS Pager: [phone] 06/02/2014  12:21 PM

## 2014-06-02 NOTE — Progress Notes (Signed)
[facility] requesting pregnancy test, urinalysis, repeat potassium, and vitals. CSW informed RN.   Noreene Larsson [phone]  ED CSW 06/02/2014 8:06 AM

## 2014-06-02 NOTE — Consult Note (Signed)
[facility] Psychiatry Consult   Reason for Consult:  Psychosis Referring Physician:  EDP Patient Identification: [name] MRN:  [id number] Principal Diagnosis: Schizoaffective disorder-chronic with exacerbation Diagnosis:   Patient Active Problem List   Diagnosis Date Noted  . Schizoaffective disorder-chronic with exacerbation [F25.8] 10/18/2013    Priority: High  . Substance dependence [F19.20] 10/18/2013    Priority: High  . Post traumatic stress disorder (PTSD) [F43.10] 09/20/2011    Priority: High  . AKI (acute kidney injury) [N17.9] 10/11/2013  . Pulmonary emboli [I26.99] 08/18/2011  . Hepatic lesion [K76.89] 12/25/2010  . Amenorrhea [N91.2] 12/25/2010  . 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 Thrombus [I82.90] 11/22/2010  . Esophagitis [530.1] 11/22/2010  . Dizziness [R42] 11/22/2010  . Leukocytosis [D72.829] 11/13/2010  . Raynaud's syndrome [I73.00] 11/13/2010  . Tachycardia [R00.0] 11/13/2010  . Tobacco abuse [Z72.0] 11/13/2010  . Marijuana abuse [F12.10] 11/13/2010    Total Time spent with patient: 30 minutes  Subjective:   [name] is a 44 y.o. female patient has improved but still not stable.  HPI:  The patient has slept some yesterday and last night.  She is better this am and can interact a little.  [name] is getting closer to her baseline but still needs a day or so to return to her baseline.  She complains of feeling tired on assessment but cannot say if this episode was initiated by cocaine, "You will run with what you want."  Medications continue. HPI Elements:   Location:  generalized. Quality:  acute. Severity:  severe on admission. Timing:  constant. Duration:  few days. Context:  cocaine use.  Past Medical History:  Past Medical History  Diagnosis Date  . Pulmonary emboli   . Schizophrenia   . Anxiety   . Depression   . PTSD (post-traumatic stress disorder)   . Cancer     pt denies ever having Any cancer  . Uterus cancer     Past Surgical  History  Procedure Laterality Date  . No past surgeries    . Uterine fibroid surgery      uterine cancer  . Multiple extractions with alveoloplasty N/A 06/08/2013    Procedure: Dorma Russell;  Surgeon: Gae Bon, DDS;  Location: [place];  Service: Oral Surgery;  Laterality: N/A;   Family History:  Family History  Problem Relation Age of Onset  . Schizophrenia Neg Hx   . Cancer Other    Social History:  History  Alcohol Use  . Yes    Comment: once montly     History  Drug Use  . Yes  . Special: Marijuana    History   Social History  . Marital Status: Legally Separated    Spouse Name: N/A  . Number of Children: N/A  . Years of Education: N/A   Social History Main Topics  . Smoking status: Current Every Day Smoker -- 2.50 packs/day for 15 years    Types: Cigarettes  . Smokeless tobacco: Never Used  . Alcohol Use: Yes     Comment: once montly  . Drug Use: Yes    Special: Marijuana  . Sexual Activity: Yes    Birth Control/ Protection: None   Other Topics Concern  . Not on file   Social History Narrative   Additional Social History:    History of alcohol / drug use?: No history of alcohol / drug abuse  Allergies:   Allergies  Allergen Reactions  . Acetaminophen     Unknown reaction   . Darvocet [Propoxyphene N-Acetaminophen] Nausea And Vomiting and Other (See Comments)    Shaking     Vitals: Blood pressure 111/68, pulse 91, temperature 98.4 F (36.9 C), temperature source Oral, resp. rate 20, last menstrual period 05/21/2014, SpO2 96 %.  Risk to Self: Suicidal Ideation: No Suicidal Intent: No Is patient at risk for suicide?: No Suicidal Plan?: No Access to Means: No What has been your use of drugs/alcohol within the last 12 months?: No alcohol or drug use reported.  How many times?: 0 Other Self Harm Risks: No other self harm risk identified at this time.  Triggers for Past Attempts: None  known Intentional Self Injurious Behavior: None Risk to Others: Homicidal Ideation: No Thoughts of Harm to Others: No Current Homicidal Intent: No Current Homicidal Plan: No Access to Homicidal Means: No Describe Access to Homicidal Means: NA Identified Victim: NA History of harm to others?: No Assessment of Violence: In distant past Violent Behavior Description: Previous documentation reported while pt was in the hospital.  Does patient have access to weapons?: No Criminal Charges Pending?: No Does patient have a court date: No Prior Inpatient Therapy: Prior Inpatient Therapy: Yes Prior Therapy Dates: 2013,2014, 2015 Prior Therapy Facilty/Provider(s): [facility] Reason for Treatment: Schizophrenia  Prior Outpatient Therapy: Prior Outpatient Therapy: No  Current Facility-Administered Medications  Medication Dose Route Frequency Provider Last Rate Last Dose  . ibuprofen (ADVIL,MOTRIN) tablet 600 mg  600 mg Oral Q8H PRN Shari A Upstill, PA-C   600 mg at 05/31/14 1203  . LORazepam (ATIVAN) tablet 1 mg  1 mg Oral Q8H PRN Dewaine Oats, PA-C   Stopped at 05/31/14 2206  . nicotine (NICODERM CQ - dosed in mg/24 hours) patch 21 mg  21 mg Transdermal Daily Debby Freiberg, MD   21 mg at 06/01/14 2113  . ondansetron (ZOFRAN) tablet 4 mg  4 mg Oral Q8H PRN Shari A Upstill, PA-C      . potassium chloride SA (K-DUR,KLOR-CON) CR tablet 20 mEq  20 mEq Oral BID Waylan Boga, NP   20 mEq at 06/01/14 2044  . Warfarin - Pharmacist Dosing Inpatient   Does not apply q1800 Emiliano Dyer, [facility]   0  at 05/31/14 1612   Current Outpatient Prescriptions  Medication Sig Dispense Refill  . cyclobenzaprine (FLEXERIL) 10 MG tablet Take 1 tablet (10 mg total) by mouth 3 (three) times daily as needed for muscle spasms. For muscle spasms (Patient taking differently: Take 10 mg by mouth 3 (three) times daily. For muscle spasms) 30 tablet 0  . diazepam (VALIUM) 5 MG tablet Take 5 mg by mouth 3 (three) times daily.     Marland Kitchen HYDROcodone-acetaminophen (NORCO) 10-325 MG per tablet Take 1 tablet by mouth 5 (five) times daily as needed for moderate pain.    . traZODone (DESYREL) 50 MG tablet Take 50 mg by mouth at bedtime as needed for sleep.    Marland Kitchen warfarin (COUMADIN) 5 MG tablet Take 1 tablet (5 mg total) by mouth daily. For history of blood clot.  Please take 1 tablet (5mg ) on Monday, Tuesday, Thursday, Friday and Saturday (Patient taking differently: Take 5-7.5 mg by mouth daily. Takes 5mg  everyday at 1700 except on Sunday takes 7.5mg ) 30 tablet 0    Musculoskeletal: Strength & Muscle Tone: within normal limits Gait & Station: normal Patient leans: N/A  Psychiatric Specialty Exam:     Blood  pressure 111/68, pulse 91, temperature 98.4 F (36.9 C), temperature source Oral, resp. rate 20, last menstrual period 05/21/2014, SpO2 96 %.There is no weight on file to calculate BMI.  General Appearance: Disheveled  Eye Sport and exercise psychologist::  Fair  Speech:  Slow  Volume:  Decreased  Mood:  Depressed  Affect:  Blunt  Thought Process:  Coherent  Orientation:  Full (Time, Place, and Person)  Thought Content:  minimal interaction  Suicidal Thoughts:  No  Homicidal Thoughts:  No  Memory:  Immediate;   Fair Recent;   Poor Remote;   Poor  Judgement:  Impaired  Insight:  Lacking  Psychomotor Activity:  Decreased  Concentration:  Fair  Recall:  [place]  Language: Fair  Akathisia:  No  Handed:  Right  AIMS (if indicated):     Assets:  Financial Resources/Insurance Housing Intimacy Physical Health Resilience Social Support  ADL's:  Intact  Cognition: WNL  Sleep:      Medical Decision Making: Review of Psycho-Social Stressors (1), Review or order clinical lab tests (1) and Review of Medication Regimen & Side Effects (2)  Treatment Plan Summary: Daily contact with patient to assess and evaluate symptoms and progress in treatment, Medication management and Plan Continue current treatment, seek  inpatient hospitalization, re-evaluate in the am  Plan:  Supportive therapy provided about ongoing stressors. Disposition:  Continue current treatment, seek inpatient hospitalization, re-evaluate in the am  Waylan Boga, PMH-NP 06/02/2014 8:42 AM

## 2014-06-02 NOTE — Progress Notes (Signed)
CSW faxed pregnancy test and uranalysis to [facility].  Willette Brace [phone] ED CSW 06/02/2014 10:22 PM

## 2014-06-02 NOTE — ED Provider Notes (Signed)
I was contacted by the ED, social worker, who [facility] Ne[facility] testing done prior to her transfer there. They requested repeat CBC, INR, and obtaining EKG and chest x-ray. These tests were ordered, and pending at this time  Richarda Blade, MD 06/02/14 2309

## 2014-06-02 NOTE — ED Notes (Signed)
Pt awake, alert & responsive, no distress noted, intermittently pacing, will continue to monitor for safety.

## 2014-06-03 DIAGNOSIS — F258 Other schizoaffective disorders: Secondary | ICD-10-CM | POA: Diagnosis not present

## 2014-06-03 LAB — CBC WITH DIFFERENTIAL/PLATELET
BASOS ABS: 0.1 10*3/uL (ref 0.0–0.1)
Basophils Relative: 1 % (ref 0–1)
Eosinophils Absolute: 0.1 10*3/uL (ref 0.0–0.7)
Eosinophils Relative: 1 % (ref 0–5)
HCT: 41.4 % (ref 36.0–46.0)
HEMOGLOBIN: 14 g/dL (ref 12.0–15.0)
LYMPHS PCT: 27 % (ref 12–46)
Lymphs Abs: 2.6 10*3/uL (ref 0.7–4.0)
MCH: 32.5 pg (ref 26.0–34.0)
MCHC: 33.8 g/dL (ref 30.0–36.0)
MCV: 96.1 fL (ref 78.0–100.0)
Monocytes Absolute: 1.2 10*3/uL — ABNORMAL HIGH (ref 0.1–1.0)
Monocytes Relative: 12 % (ref 3–12)
NEUTROS ABS: 5.9 10*3/uL (ref 1.7–7.7)
Neutrophils Relative %: 59 % (ref 43–77)
Platelets: 488 10*3/uL — ABNORMAL HIGH (ref 150–400)
RBC: 4.31 MIL/uL (ref 3.87–5.11)
RDW: 13.7 % (ref 11.5–15.5)
WBC: 9.8 10*3/uL (ref 4.0–10.5)

## 2014-06-03 LAB — PROTIME-INR
INR: 2.36 — AB (ref 0.00–1.49)
Prothrombin Time: 26 seconds — ABNORMAL HIGH (ref 11.6–15.2)

## 2014-06-03 NOTE — Consult Note (Signed)
[facility] Psychiatry Consult   Reason for Consult:  Psychosis Referring Physician:  EDP Patient Identification: [name] MRN:  [id number] Principal Diagnosis: Schizoaffective disorder-chronic with exacerbation Diagnosis:   Patient Active Problem List   Diagnosis Date Noted  . Schizoaffective disorder-chronic with exacerbation [F25.8] 10/18/2013    Priority: High  . Substance dependence [F19.20] 10/18/2013  . AKI (acute kidney injury) [N17.9] 10/11/2013  . Post traumatic stress disorder (PTSD) [F43.10] 09/20/2011  . Pulmonary emboli [I26.99] 08/18/2011  . Hepatic lesion [K76.89] 12/25/2010  . Amenorrhea [N91.2] 12/25/2010  . 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 Thrombus [I82.90] 11/22/2010  . Esophagitis [530.1] 11/22/2010  . Dizziness [R42] 11/22/2010  . Leukocytosis [D72.829] 11/13/2010  . Raynaud's syndrome [I73.00] 11/13/2010  . Tachycardia [R00.0] 11/13/2010  . Tobacco abuse [Z72.0] 11/13/2010  . Marijuana abuse [F12.10] 11/13/2010    Total Time spent with patient: 30 minutes  Subjective:   [name] is a 44 y.o. female patient has improved but still not stable.  HPI:  The patient has slept some yesterday and last night.  She is better this am and can interact a little.  Clariza is getting closer to her baseline but still needs a day or so to return to her baseline.  She complains of feeling tired on assessment but cannot say if this episode was initiated by cocaine, "You will run with what you want."  Medications continue. Reviewed above note, updates added.  Patient reported that she slept well last night.  She was seen in her room sitting after breakfast.  She has agreed to take her medications at home but stated that her PMD had told her not to take her MH medications.  Patient was advised that she need all of her medications and that she need to follow up with her Psychiatrist at [facility].  Patient denies SI/HI/AVH and she is discharged home.  HPI Elements:   Location:   generalized. Quality:  acute. Severity:  severe on admission. Timing:  constant. Duration:  few days. Context:  cocaine use.  Past Medical History:  Past Medical History  Diagnosis Date  . Pulmonary emboli   . Schizophrenia   . Anxiety   . Depression   . PTSD (post-traumatic stress disorder)   . Cancer     pt denies ever having Any cancer  . Uterus cancer     Past Surgical History  Procedure Laterality Date  . No past surgeries    . Uterine fibroid surgery      uterine cancer  . Multiple extractions with alveoloplasty N/A 06/08/2013    Procedure: Dorma Russell;  Surgeon: Gae Bon, DDS;  Location: [place];  Service: Oral Surgery;  Laterality: N/A;   Family History:  Family History  Problem Relation Age of Onset  . Schizophrenia Neg Hx   . Cancer Other    Social History:  History  Alcohol Use  . Yes    Comment: once montly     History  Drug Use  . Yes  . Special: Marijuana    History   Social History  . Marital Status: Legally Separated    Spouse Name: N/A  . Number of Children: N/A  . Years of Education: N/A   Social History Main Topics  . Smoking status: Current Every Day Smoker -- 2.50 packs/day for 15 years    Types: Cigarettes  . Smokeless tobacco: Never Used  . Alcohol Use: Yes     Comment: once montly  . Drug Use: Yes  Special: Marijuana  . Sexual Activity: Yes    Birth Control/ Protection: None   Other Topics Concern  . Not on file   Social History Narrative   Additional Social History:    History of alcohol / drug use?: No history of alcohol / drug abuse   Allergies:   Allergies  Allergen Reactions  . Acetaminophen     Unknown reaction   . Darvocet [Propoxyphene N-Acetaminophen] Nausea And Vomiting and Other (See Comments)    Shaking     Vitals: Blood pressure 119/75, pulse 106, temperature 98 F (36.7 C), temperature source Oral, resp. rate 16, last menstrual period 05/21/2014, SpO2 100  %.  Risk to Self: Suicidal Ideation: No Suicidal Intent: No Is patient at risk for suicide?: No Suicidal Plan?: No Access to Means: No What has been your use of drugs/alcohol within the last 12 months?: No alcohol or drug use reported.  How many times?: 0 Other Self Harm Risks: No other self harm risk identified at this time.  Triggers for Past Attempts: None known Intentional Self Injurious Behavior: None Risk to Others: Homicidal Ideation: No Thoughts of Harm to Others: No Current Homicidal Intent: No Current Homicidal Plan: No Access to Homicidal Means: No Describe Access to Homicidal Means: NA Identified Victim: NA History of harm to others?: No Assessment of Violence: In distant past Violent Behavior Description: Previous documentation reported while pt was in the hospital.  Does patient have access to weapons?: No Criminal Charges Pending?: No Does patient have a court date: No Prior Inpatient Therapy: Prior Inpatient Therapy: Yes Prior Therapy Dates: 2013,2014, 2015 Prior Therapy Facilty/Provider(s): [facility] Reason for Treatment: Schizophrenia  Prior Outpatient Therapy: Prior Outpatient Therapy: No  Current Facility-Administered Medications  Medication Dose Route Frequency Provider Last Rate Last Dose  . ibuprofen (ADVIL,MOTRIN) tablet 600 mg  600 mg Oral Q8H PRN Shari A Upstill, PA-C   600 mg at 05/31/14 1203  . LORazepam (ATIVAN) tablet 1 mg  1 mg Oral Q8H PRN Dewaine Oats, PA-C   Stopped at 05/31/14 2206  . nicotine (NICODERM CQ - dosed in mg/24 hours) patch 21 mg  21 mg Transdermal Daily Debby Freiberg, MD   21 mg at 06/02/14 0957  . ondansetron (ZOFRAN) tablet 4 mg  4 mg Oral Q8H PRN Shari A Upstill, PA-C      . potassium chloride SA (K-DUR,KLOR-CON) CR tablet 20 mEq  20 mEq Oral BID Waylan Boga, NP   20 mEq at 06/02/14 2112  . Warfarin - Pharmacist Dosing Inpatient   Does not apply q1800 Emiliano Dyer, [facility]   0  at 05/31/14 1612   Current Outpatient  Prescriptions  Medication Sig Dispense Refill  . cyclobenzaprine (FLEXERIL) 10 MG tablet Take 1 tablet (10 mg total) by mouth 3 (three) times daily as needed for muscle spasms. For muscle spasms (Patient taking differently: Take 10 mg by mouth 3 (three) times daily. For muscle spasms) 30 tablet 0  . diazepam (VALIUM) 5 MG tablet Take 5 mg by mouth 3 (three) times daily.    Marland Kitchen HYDROcodone-acetaminophen (NORCO) 10-325 MG per tablet Take 1 tablet by mouth 5 (five) times daily as needed for moderate pain.    . traZODone (DESYREL) 50 MG tablet Take 50 mg by mouth at bedtime as needed for sleep.    Marland Kitchen warfarin (COUMADIN) 5 MG tablet Take 1 tablet (5 mg total) by mouth daily. For history of blood clot.  Please take 1 tablet (5mg ) on Monday, Tuesday, Thursday,  Friday and Saturday (Patient taking differently: Take 5-7.5 mg by mouth daily. Takes 5mg  everyday at 1700 except on Sunday takes 7.5mg ) 30 tablet 0    Musculoskeletal: Strength & Muscle Tone: within normal limits Gait & Station: normal Patient leans: N/A  Psychiatric Specialty Exam:     Blood pressure 119/75, pulse 106, temperature 98 F (36.7 C), temperature source Oral, resp. rate 16, last menstrual period 05/21/2014, SpO2 100 %.There is no weight on file to calculate BMI.  General Appearance: Casual  Eye Contact::  Good  Speech:  Normal Rate  Volume:  Normal  Mood:  Depressed  Affect:  Congruent and Depressed  Thought Process:  Coherent  Orientation:  Full (Time, Place, and Person)  Thought Content:  WDL  Suicidal Thoughts:  No  Homicidal Thoughts:  No  Memory:  Immediate;   Fair Recent;   Poor Remote;   Poor  Judgement:  Fair  Insight:  Shallow  Psychomotor Activity:  Normal  Concentration:  Fair  Recall:  [place]: Fair  Akathisia:  No  Handed:  Right  AIMS (if indicated):     Assets:  Financial Resources/Insurance Housing Intimacy Physical Health Resilience Social Support  ADL's:   Intact  Cognition: WNL  Sleep:      Medical Decision Making: Established Problem, Stable/Improving (1)  Treatment Plan Summary: Plan Discharge home  Plan:  Discharge home Disposition:  Discharge home  Delfin Gant, PMHNP-BC 06/03/2014 10:15 AM

## 2014-06-03 NOTE — Discharge Instructions (Signed)
For your ongoing mental health needs, you are advised to follow up with [facility].  New and returning patients are seen at their walk-in clinic.  Walk-in hours are Monday - Friday from 8:00 am - 3:00 pm.  Walk-in patients are seen on a first come, first served basis.  Try to arrive as early as possible for he best chance of being seen the same day: ° °     [facility] °     [address] °     [place], [place] [address] °     [phone] °

## 2014-06-03 NOTE — BH Assessment (Signed)
[place] Assessment Progress Note  Per Donnelly Angelica, MD this pt no longer meets criteria for IVC, nor does she require psychiatric hospitalization.  Petition is to be rescinded and she is to be discharged from [facility] with referral to [facility], her most recent outpatient provider.  IVC has been rescinded.  Discharge instructions include referral information for [facility].  Pt's nurse has been notified.  Jalene Mullet, MA Triage Specialist 06/03/2014 @ 10:26

## 2014-06-03 NOTE — ED Notes (Signed)
Collected on 06/02/14@ 23:55. PT 26.0 INR 2.36

## 2014-06-03 NOTE — Progress Notes (Signed)
CSW provided patietn with cab voucher. Pt states she does not want her husband to pick her up as she plans on leaving husband. Pt states that her husband does not live at home with her. Pt contracted for safety.   Belia Heman, [place] Work  [facility] [phone]  06/03/2014 11:55 AM

## 2014-06-09 ENCOUNTER — Ambulatory Visit
Admit: 2014-06-09 | Discharge: 2014-06-09 | Payer: PRIVATE HEALTH INSURANCE | Attending: Physician Assistant | Primary: Internal Medicine

## 2014-06-09 DIAGNOSIS — Z0000 Encounter for general adult medical examination without abnormal findings: Secondary | ICD-10-CM

## 2014-06-09 MED ORDER — ALBUTEROL SULFATE HFA 90 MCG/ACTUATION AEROSOL INHALER
90 mcg/actuation | Freq: Four times a day (QID) | RESPIRATORY_TRACT | Status: AC | PRN
Start: 2014-06-09 — End: ?

## 2014-06-09 NOTE — Progress Notes (Signed)
HISTORY OF PRESENT ILLNESS  Crystal Rollins Crystal Rollins a 44 y.o. female.  HPI Ms. Crystal Rollins is here to establish care and for evaluation of her weight. She reports h/o gastric bypass surgery, but has regained about 170 lbs. She said prior to the surgery, she was close to 400 lbs. Since regaining the weight, she reports being back on CPAP, knee pain, sciatic pain. She is interested in addressing her weight again.  She does have asthma, mostly triggered by perfumes and seasonal - pollen, flowers etc. She uses albuterol prn and does not need it at night or on a regular basis. She has not had a mammogram in several years. She had an EKG, Stress test, echo and cardiology evaluation about a year ago and reports her work-up was normal.   She develops boils under her arms and breasts on a regular basis. She typically pops them herself and has not received treatment for them.     Review of Systems   Constitutional: Negative.    HENT: Negative.    Eyes: Negative.    Respiratory: Negative.    Cardiovascular: Negative.    Gastrointestinal: Negative.    Genitourinary: Negative.    Musculoskeletal: Positive for joint pain.   Psychiatric/Behavioral: Positive for depression (sees psychiatry). The patient has insomnia.        Physical Exam   Constitutional: She is oriented to person, place, and time. She appears well-developed and well-nourished. No distress.   HENT:   Head: Normocephalic and atraumatic.   Eyes: Conjunctivae are normal. Pupils are equal, round, and reactive to light.   Neck: Normal range of motion. Neck supple.   Cardiovascular: Normal rate, regular rhythm and intact distal pulses.    No murmur heard.  Pulmonary/Chest: Effort normal and breath sounds normal. She has no wheezes.   Musculoskeletal: She exhibits no edema.   Neurological: She is alert and oriented to person, place, and time.   Skin:   There is scaring and evidence of boils under her axillae. Currently no  signs of infection. Suggested surgical referral. 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 She did not want referral at this time. Will follow up if there are flare-ups.     Visit Vitals   Item Reading   ??? BP 101/66 mmHg   ??? Pulse 73   ??? Temp(Src) 98.2 ??F (36.8 ??C) (Oral)   ??? Resp 18   ??? Ht 5' 4.5" (1.638 m)   ??? Wt 282 lb (127.914 kg)   ??? BMI 47.67 kg/m2   ??? SpO2 97%       ASSESSMENT and PLAN    ICD-10-CM ICD-9-CM    1. Breast cancer screening Z12.39 V76.10 MAM MAMMO BI SCREENING DIGTL   2. Routine general medical examination at a health care facility Z00.00 V70.0 METABOLIC PANEL, COMPREHENSIVE      LIPID PANEL      CBC WITH AUTOMATED DIFF      TSH, 3RD GENERATION      PR COLLECTION VENOUS BLOOD,VENIPUNCTURE   3. Mild intermittent asthma without complication J45.20 493.90 albuterol (PROVENTIL HFA, VENTOLIN HFA, PROAIR HFA) 90 mcg/actuation inhaler   4. Morbid obesity with body mass index of 45.0-49.9 in adult (HCC) E66.01 278.01 REFERRAL TO BARIATRIC SURGERY    Z68.42 V85.42

## 2014-06-09 NOTE — Progress Notes (Signed)
Chief Complaint   Patient presents with   ??? Establish Care   ??? Weight Management   ??? Depression   ??? Advance Care Planning       1. Have you been to the ER, urgent care clinic since your last visit?  Hospitalized since your last visit?No    2. Have you seen or consulted any other health care providers outside of the [facility] since your last visit?  Include any pap smears or colon screening. new to the office

## 2014-06-10 LAB — CBC WITH AUTOMATED DIFF
ABS. BASOPHILS: 0 10*3/uL (ref 0.0–0.2)
ABS. EOSINOPHILS: 0.1 10*3/uL (ref 0.0–0.4)
ABS. IMM. GRANS.: 0 10*3/uL (ref 0.0–0.1)
ABS. MONOCYTES: 0.3 10*3/uL (ref 0.1–0.9)
ABS. NEUTROPHILS: 2.8 10*3/uL (ref 1.4–7.0)
Abs Lymphocytes: 1.3 10*3/uL (ref 0.7–3.1)
BASOPHILS: 1 %
EOSINOPHILS: 3 %
HCT: 41.9 % (ref 34.0–46.6)
HGB: 13.1 g/dL (ref 11.1–15.9)
IMMATURE GRANULOCYTES: 0 %
Lymphocytes: 29 %
MCH: 27.3 pg (ref 26.6–33.0)
MCHC: 31.3 g/dL — ABNORMAL LOW (ref 31.5–35.7)
MCV: 88 fL (ref 79–97)
MONOCYTES: 6 %
NEUTROPHILS: 61 %
PLATELET: 304 10*3/uL (ref 150–379)
RBC: 4.79 x10E6/uL (ref 3.77–5.28)
RDW: 15.7 % — ABNORMAL HIGH (ref 12.3–15.4)
WBC: 4.5 10*3/uL (ref 3.4–10.8)

## 2014-06-10 LAB — METABOLIC PANEL, COMPREHENSIVE
A-G Ratio: 1.6 (ref 1.1–2.5)
ALT (SGPT): 20 IU/L (ref 0–32)
AST (SGOT): 21 IU/L (ref 0–40)
Albumin: 4.1 g/dL (ref 3.5–5.5)
Alk. phosphatase: 215 IU/L — ABNORMAL HIGH (ref 39–117)
BUN/Creatinine ratio: 9 (ref 9–23)
BUN: 8 mg/dL (ref 6–24)
Bilirubin, total: 0.2 mg/dL (ref 0.0–1.2)
CO2: 22 mmol/L (ref 18–29)
Calcium: 8.8 mg/dL (ref 8.7–10.2)
Chloride: 104 mmol/L (ref 97–108)
Creatinine: 0.88 mg/dL (ref 0.57–1.00)
GFR est AA: 92 mL/min/{1.73_m2} (ref >59)
GFR est non-AA: 80 mL/min/{1.73_m2} (ref >59)
GLOBULIN, TOTAL: 2.5 g/dL (ref 1.5–4.5)
Glucose: 97 mg/dL (ref 65–99)
Potassium: 4.6 mmol/L (ref 3.5–5.2)
Protein, total: 6.6 g/dL (ref 6.0–8.5)
Sodium: 142 mmol/L (ref 134–144)

## 2014-07-14 ENCOUNTER — Encounter: Attending: Physician Assistant | Primary: Internal Medicine

## 2014-07-14 ENCOUNTER — Encounter: Attending: Surgery | Primary: Internal Medicine

## 2014-07-14 ENCOUNTER — Ambulatory Visit
Admit: 2014-07-14 | Discharge: 2014-07-15 | Payer: PRIVATE HEALTH INSURANCE | Attending: Physician Assistant | Primary: Internal Medicine

## 2014-07-14 DIAGNOSIS — R748 Abnormal levels of other serum enzymes: Secondary | ICD-10-CM

## 2014-07-14 DIAGNOSIS — M542 Cervicalgia: Secondary | ICD-10-CM

## 2014-07-14 NOTE — Progress Notes (Signed)
HISTORY OF PRESENT ILLNESS  Crystal Rollins is a 44 y.o. female.  HPI Crystal Rollins is here to follow up after she fell over a rail on her front porch that she had been leaning on, She estimates she fell about 5 feet. She hit her head off the ground.She was seen at [facility]. Imaging was done - see below.  She was given Norco and Valium along with zofran. She is having some improvement in pain.   I also reviewed again that her alk phos were elevated. She said her dermatologist also contacted her about it as well. Will draw the isoenzymes today.     ED CT HEAD NO CONTRAST3/31/2016   [facility]           Result Narrative   EXAM: CT head    INDICATION: Fall    COMPARISON: None.    TECHNIQUE: Axial CT imaging of the head was performed without intravenous contrast. Dose reduction techniques used: automated exposure control, adjustment of the mAs and/or kVp according to patient size, and iterative reconstruction techniques    _______________    FINDINGS:    BRAIN AND POSTERIOR FOSSA: The sulci, folia, ventricles and basal cisterns are within normal limits for the patient's age. There is no intracranial hemorrhage, mass effect, or midline shift. There are no areas of abnormal parenchymal attenuation.    EXTRA-AXIAL SPACES AND MENINGES: There are no abnormal extra-axial fluid collections.    CALVARIUM: Intact.    SINUSES: Clear.    OTHER: None.     CT CERVICAL SPINE W/O CONTRAST3/31/2016   [facility]   Result Impression   IMPRESSION:    No acute fracture or subluxation in the cervical spine.     Result Narrative   EXAM: CT cervical spine    INDICATION: Pain status post fall    COMPARISON: None.    TECHNIQUE: Axial CT imaging of the cervical spine was performed from the skull base to the thoracic inlet without intravenous contrast. Multiplanar reformats were generated. Dose reduction techniques used: automated exposure control, adjustment of the mAs and/or kVp according to patient  size, and iterative reconstruction techniques    _______________    FINDINGS:    VERTEBRAE AND DISCS: Vertebral alignment and articulation are within normal limits. The C1-C2 relationship is normal. Normal alignment of the occipital condyles with the lateral masses of C1. Vertebral body and disc space heights are maintained. Specifically, no compression deformities are noted and there is no spondylolisthesis. No displaced fractures are identified. There are no significant areas of bone lucency or sclerosis.    SPINAL CANAL AND FORAMINA: Patent without significant bony canal or foramina encroachment.    PREVERTEBRAL SOFT TISSUES: Normal.    VISIBLE PORTIONS OF POSTERIOR FOSSA/BRAIN: Normal.    LUNG APICES: Clear.    OTHER: None.     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     Result Narrative   EXAM: Right shoulder complete    INDICATION: Fall, trauma     COMPARISON: None.    _______________    FINDINGS:    Internal rotation, external rotation, AP, and transscapular projections of the right shoulder demonstrate normal alignment. No fracture. Mild acromioclavicular joint osteoarthritis present. Glenohumeral joint space appears normal. Included right lung is clear.         Review of Systems   Eyes: Negative.    Cardiovascular: Negative.    Gastrointestinal: Negative for abdominal pain.   Musculoskeletal: Positive for back pain (  upper and lower) and neck pain (with extension).   Neurological: Negative for dizziness, tingling (denies any numbness or tingling in her arms or legs) and headaches.       Physical Exam   Constitutional: She is oriented to person, place, and time. She appears well-developed and well-nourished. No distress.   HENT:   Head: Normocephalic and atraumatic.   Eyes: Conjunctivae are normal. Pupils are equal, round, and reactive to light.   Cardiovascular: Normal rate.    Pulmonary/Chest: Effort normal.   Musculoskeletal:         Right shoulder: She exhibits decreased range of motion and pain (06/03/08).        Lumbar back: She exhibits decreased range of motion, tenderness and pain (08/05/08).        Back:    Neurological: She is alert and oriented to person, place, and time. She has normal strength.     Visit Vitals   Item Reading   ??? BP 113/79 mmHg   ??? Pulse 79   ??? Temp(Src) 97.5 ??F (36.4 ??C) (Oral)   ??? Resp 18   ??? Ht 5' 4.5" (1.638 m)   ??? Wt 284 lb (128.822 kg)   ??? BMI 48.01 kg/m2   ??? SpO2 98%     Discussed PT as the next step in treatment, but she does not want to go at this time.  She will work on home PT exercises.   ASSESSMENT and PLAN    ICD-10-CM ICD-9-CM    1. Neck pain M54.2 723.1 Continue current Rx and home exercises. If she considers PT, she can just call for the referral.    2. Upper back pain M54.9 724.5    3. Low back pain without sciatica, unspecified back pain laterality M54.5 724.2    4. Elevated alkaline phosphatase level R74.8 790.5             ALK PHOS ISOENZYMES      PR COLLECTION VENOUS BLOOD,VENIPUNCTURE

## 2014-07-14 NOTE — Patient Instructions (Signed)
Shoulder Stretches: Exercises  Your Care Instructions  Here are some examples of exercises for your shoulder. Start each exercise slowly. Ease off the exercise if you start to have pain.  Your doctor or physical therapist will tell you when you can start these exercises and which ones will work best for you.  How to do the exercises  Note: These exercises should cause you to feel a gentle stretch, but no pain.  Shoulder stretch    1. Stand in a doorway and place one arm against the door frame. Your elbow should be a little higher than your shoulder.  2. Relax your shoulders as you lean forward, allowing your chest and shoulder muscles to stretch. You can also turn your body slightly away from your arm to stretch the muscles even more.  3. Hold for 15 to 30 seconds.  4. Repeat 2 to 4 times with each arm.  Shoulder and chest stretch    Shoulder and chest stretch   1. While sitting, relax your upper body so you slump slightly in your chair.  2. As you breathe in, straighten your back and open your arms out to the sides.  3. Gently pull your shoulder blades back and downward.  4. Hold for 15 to 30 seconds as your breathe normally.  5. Repeat 2 to 4 times.  Overhead stretch    1. Reach up over your head with both arms.  2. Hold for 15 to 30 seconds.  3. Repeat 2 to 4 times.  Follow-up care is a key part of your treatment and safety. Be sure to make and go to all appointments, and call your doctor if you are having problems. It's also a good idea to know your test results and keep a list of the medicines you take.   Where can you learn more?   Go to http://www.healthwise.net/[facility]  Enter S254 in the search box to learn more about "Shoulder Stretches: Exercises."   ?? 2006-2015 Healthwise, Incorporated. Care instructions adapted under license by [place] (which disclaims liability or warranty for this information). This care instruction is for use with your licensed  healthcare professional. If you have questions about a medical condition or this instruction, always ask your healthcare professional. Healthwise, Incorporated disclaims any warranty or liability for your use of this information.  Content Version: 10.7.482551; Current as of: Aug 21, 2013              Low Back Pain: Exercises  Your Care Instructions  Here are some examples of typical rehabilitation exercises for your condition. Start each exercise slowly. Ease off the exercise if you start to have pain.  Your doctor or physical therapist will tell you when you can start these exercises and which ones will work best for you.  How to do the exercises  Press-up    5. Lie on your stomach, supporting your body with your forearms.  6. Press your elbows down into the floor to raise your upper back. As you do this, relax your stomach muscles and allow your back to arch without using your back muscles. As your press up, do not let your hips or pelvis come off the floor.  7. Hold for 15 to 30 seconds, then relax.  8. Repeat 2 to 4 times.  Alternate arm and leg (bird dog) exercise    Note: Do this exercise slowly. Try to keep your body straight at all times, and do not let one hip drop lower than the   other.  6. Start on the floor, on your hands and knees.  7. Tighten your belly muscles.  8. Raise one leg off the floor, and hold it straight out behind you. Be careful not to let your hip drop down, because that will twist your trunk.  9. Hold for about 6 seconds, then lower your leg and switch to the other leg.  10. Repeat 8 to 12 times on each leg.  11. Over time, work up to holding for 10 to 30 seconds each time.  12. If you feel stable and secure with your leg raised, try raising the opposite arm straight out in front of you at the same time.  Knee-to-chest exercise    4. Lie on your back with your knees bent and your feet flat on the floor.  5. Bring one knee to your chest, keeping the other foot flat on the floor  (or keeping the other leg straight, whichever feels better on your lower back).  6. Keep your lower back pressed to the floor. Hold for at least 15 to 30 seconds.  7. Relax, and lower the knee to the starting position.  8. Repeat with the other leg. Repeat 2 to 4 times with each leg.  9. To get more stretch, put your other leg flat on the floor while pulling your knee to your chest.  Curl-ups    1. Lie on the floor on your back with your knees bent at a 90-degree angle. Your feet should be flat on the floor, about 12 inches from your buttocks.  2. Cross your arms over your chest. If this bothers your neck, try putting your hands behind your neck (not your head), with your elbows spread apart.  3. Slowly tighten your belly muscles and raise your shoulder blades off the floor.  4. Keep your head in line with your body, and do not press your chin to your chest.  5. Hold this position for 1 or 2 seconds, then slowly lower yourself back down to the floor.  6. Repeat 8 to 12 times.  Pelvic tilt exercise    1. Lie on your back with your knees bent.  2. "Brace" your stomach. This means to tighten your muscles by pulling in and imagining your belly button moving toward your spine. You should feel like your back is pressing to the floor and your hips and pelvis are rocking back.  3. Hold for about 6 seconds while you breathe smoothly.  4. Repeat 8 to 12 times.  Heel dig bridging    1. Lie on your back with both knees bent and your ankles bent so that only your heels are digging into the floor. Your knees should be bent about 90 degrees.  2. Then push your heels into the floor, squeeze your buttocks, and lift your hips off the floor until your shoulders, hips, and knees are all in a straight line.  3. Hold for about 6 seconds as you continue to breathe normally, and then slowly lower your hips back down to the floor and rest for up to 10 seconds.  4. Do 8 to 12 repetitions.  Hamstring stretch in doorway     1. Lie on your back in a doorway, with one leg through the open door.  2. Slide your leg up the wall to straighten your knee. You should feel a gentle stretch down the back of your leg.  3. Hold the stretch for at least 15 to 30 seconds.   Do not arch your back, point your toes, or bend either knee. Keep one heel touching the floor and the other heel touching the wall.  4. Repeat with your other leg.  5. Do 2 to 4 times for each leg.  Hip flexor stretch    1. Kneel on the floor with one knee bent and one leg behind you. Place your forward knee over your foot. Keep your other knee touching the floor.  2. Slowly push your hips forward until you feel a stretch in the upper thigh of your rear leg.  3. Hold the stretch for at least 15 to 30 seconds. Repeat with your other leg.  4. Do 2 to 4 times on each side.  Wall sit    1. Stand with your back 10 to 12 inches away from a wall.  2. Lean into the wall until your back is flat against it.  3. Slowly slide down until your knees are slightly bent, pressing your lower back into the wall.  4. Hold for about 6 seconds, then slide back up the wall.  5. Repeat 8 to 12 times.  Follow-up care is a key part of your treatment and safety. Be sure to make and go to all appointments, and call your doctor if you are having problems. It's also a good idea to know your test results and keep a list of the medicines you take.   Where can you learn more?   Go to http://www.healthwise.net/[facility]  Enter Z938 in the search box to learn more about "Low Back Pain: Exercises."   ?? 2006-2015 Healthwise, Incorporated. Care instructions adapted under license by [place] (which disclaims liability or warranty for this information). This care instruction is for use with your licensed healthcare professional. If you have questions about a medical condition or this instruction, always ask your healthcare professional. Healthwise, Incorporated disclaims any warranty or liability for your use of this  information.  Content Version: 10.7.482551; Current as of: Aug 21, 2013

## 2014-07-14 NOTE — Progress Notes (Signed)
Chief Complaint   Patient presents with   ??? Hospital Follow Up     1. Have you been to the ER, urgent care clinic since your last visit?  Hospitalized since your last visit?Yes Where: [facility] Reason for visit: shoulder pain and neck pain     2. Have you seen or consulted any other health care providers outside of the [facility] since your last visit?  Include any pap smears or colon screening. No

## 2014-07-15 ENCOUNTER — Inpatient Hospital Stay: Admit: 2014-07-15 | Payer: BLUE CROSS/BLUE SHIELD | Primary: Internal Medicine

## 2014-07-15 LAB — ALK PHOS ISOENZYMES
Alkaline phosphatase: 232 IU/L — ABNORMAL HIGH (ref 39–117)
Bone fraction: 39 % (ref 14–68)
Intestinal fraction: 0 % (ref 0–18)
Liver fraction: 61 % (ref 18–85)

## 2014-07-21 NOTE — Telephone Encounter (Signed)
Her alk phos is still elevated but the breakdown of the isoenzymes is normal.   Gallbladder issues can elevate the alk phos -if she still has her gallbladder I can order an ultrasound.   If that is normal, there are other labs that can be ordered.

## 2014-07-22 ENCOUNTER — Encounter

## 2014-07-22 NOTE — Telephone Encounter (Signed)
Called pt lmom for pt to return call

## 2014-07-22 NOTE — Telephone Encounter (Signed)
Pt returned call. She said call her at work number [phone]

## 2014-07-22 NOTE — Telephone Encounter (Signed)
Pt does not have her gallbladder anymore. i told her there were additional labs your wanted to add but did specify. Can you please order labs so that when she walks in the orders will be there

## 2014-07-22 NOTE — Telephone Encounter (Signed)
Pt aware

## 2014-07-28 ENCOUNTER — Other Ambulatory Visit
Admit: 2014-07-28 | Discharge: 2014-07-28 | Payer: PRIVATE HEALTH INSURANCE | Attending: Physician Assistant | Primary: Internal Medicine

## 2014-07-28 DIAGNOSIS — R748 Abnormal levels of other serum enzymes: Secondary | ICD-10-CM

## 2014-07-29 ENCOUNTER — Inpatient Hospital Stay: Admit: 2014-07-29 | Payer: BLUE CROSS/BLUE SHIELD | Primary: Internal Medicine

## 2014-07-29 LAB — HEPATITIS C AB
Hep C virus Ab Interp.: NEGATIVE
Hepatitis C virus Ab: <0.02 Index (ref <0.80)

## 2014-07-29 LAB — PROTHROMBIN TIME + INR
INR: 0.9 (ref 0.8–1.2)
Prothrombin time: 12.3 s (ref 11.5–15.2)

## 2014-07-29 LAB — HEPATITIS C ANTIBODY
HCV Ab: <0.02 Index (ref <0.80)
Hepatitis C Ab: NEGATIVE

## 2014-07-30 ENCOUNTER — Emergency Department (HOSPITAL_COMMUNITY)
Admission: EM | Admit: 2014-07-30 | Discharge: 2014-07-31 | Disposition: A | Discharge status: Home / Self Care | Payer: Medicaid Other | Attending: Emergency Medicine | Admitting: Emergency Medicine

## 2014-07-30 ENCOUNTER — Encounter (HOSPITAL_COMMUNITY): Payer: Self-pay | Admitting: Emergency Medicine

## 2014-07-30 DIAGNOSIS — Z79899 Other long term (current) drug therapy: Secondary | ICD-10-CM | POA: Insufficient documentation

## 2014-07-30 DIAGNOSIS — Z7901 Long term (current) use of anticoagulants: Secondary | ICD-10-CM | POA: Diagnosis not present

## 2014-07-30 DIAGNOSIS — Z720 Tobacco use: Secondary | ICD-10-CM | POA: Diagnosis not present

## 2014-07-30 DIAGNOSIS — F419 Anxiety disorder, unspecified: Secondary | ICD-10-CM | POA: Insufficient documentation

## 2014-07-30 DIAGNOSIS — Z86711 Personal history of pulmonary embolism: Secondary | ICD-10-CM | POA: Insufficient documentation

## 2014-07-30 DIAGNOSIS — R519 Headache, unspecified: Secondary | ICD-10-CM

## 2014-07-30 DIAGNOSIS — R51 Headache: Secondary | ICD-10-CM | POA: Diagnosis present

## 2014-07-30 DIAGNOSIS — Z8542 Personal history of malignant neoplasm of other parts of uterus: Secondary | ICD-10-CM | POA: Insufficient documentation

## 2014-07-30 LAB — EBV NUCLEAR ANTIGEN, AB, IGG: EBV Nuclear Antigen Ab, IgG: 238 U/mL — ABNORMAL HIGH (ref 0.0–17.9)

## 2014-07-30 LAB — ANTI-NEUTROPHIL CYTOPLASMIC AB
ATYPICAL PANCA: <1:20 {titer}
Atypical pANCA: <1:20 {titer}
Cytoplasmic (C-ANCA) Ab: <1:20 {titer}
Cytoplasmic (C-ANCA) Ab: <1:20 {titer}
PERINUCLEAR (P-ANCA), 162401: <1:20 {titer}
Perinuclear (P-ANCA): <1:20 {titer}

## 2014-07-30 LAB — ANA REFLEX PANEL
Anti-DNA (DS) Ab, QT: 10 IU/mL — ABNORMAL HIGH (ref 0–9)
Anti-DNA (DS) Ab, QT: 10 IU/mL — ABNORMAL HIGH (ref 0–9)
Anti-Jo-1: <0.2 AI (ref 0.0–0.9)
Anti-Jo-1: <0.2 AI (ref 0.0–0.9)
Antichromatin Ab: 0.3 AI (ref 0.0–0.9)
Antichromatin Antibodies: 0.3 AI (ref 0.0–0.9)
Antiscleroderma-70 Antibodies: <0.2 AI (ref 0.0–0.9)
Centromere B Ab: <0.2 AI (ref 0.0–0.9)
Centromere B Antibody: <0.2 AI (ref 0.0–0.9)
RNP ABS: <0.2 AI (ref 0.0–0.9)
RNP Abs: <0.2 AI (ref 0.0–0.9)
Scleroderma-70 Ab: <0.2 AI (ref 0.0–0.9)
Sjogren's Anti-SS-A: <0.2 AI (ref 0.0–0.9)
Sjogren's Anti-SS-A: <0.2 AI (ref 0.0–0.9)
Sjogren's Anti-SS-B: 0.5 AI (ref 0.0–0.9)
Sjogren's Anti-SS-B: 0.5 AI (ref 0.0–0.9)
Smith Abs: <0.2 AI (ref 0.0–0.9)
Smith Abs: <0.2 AI (ref 0.0–0.9)

## 2014-07-30 LAB — IMMUNOGLOBULINS, G/A/M, QT.
Immunoglobulin A, Qt.: 244 mg/dL (ref 87–352)
Immunoglobulin G, Qt.: 1376 mg/dL (ref 700–1600)
Immunoglobulin M, Qt.: 151 mg/dL (ref 26–217)

## 2014-07-30 LAB — CMV AB, IGG: Cytomegalovirus Ab, IgG: >10 U/mL — ABNORMAL HIGH (ref 0.00–0.59)

## 2014-07-30 LAB — GGT: GGT: 19 IU/L (ref 0–60)

## 2014-07-30 LAB — ANA, DIRECT, W/REFLEX
ANA, Direct: POSITIVE — AB
ANA: POSITIVE — AB

## 2014-07-30 NOTE — ED Notes (Signed)
Pt from home c/o headache on top part of head all day today. She reports not using anything at home to help relive pain.

## 2014-07-31 MED ORDER — METOCLOPRAMIDE HCL 10 MG PO TABS
10.0000 mg | ORAL_TABLET | Freq: Once | ORAL | Status: AC
  Administered 2014-07-31: 10 mg via ORAL
  Filled 2014-07-31: qty 1

## 2014-07-31 MED ORDER — KETOROLAC TROMETHAMINE 60 MG/2ML IM SOLN
60.0000 mg | Freq: Once | INTRAMUSCULAR | Status: AC
  Administered 2014-07-31: 60 mg via INTRAMUSCULAR
  Filled 2014-07-31: qty 2

## 2014-07-31 MED ORDER — DIPHENHYDRAMINE HCL 25 MG PO CAPS
50.0000 mg | ORAL_CAPSULE | Freq: Once | ORAL | Status: AC
  Administered 2014-07-31: 50 mg via ORAL
  Filled 2014-07-31: qty 2

## 2014-07-31 NOTE — Discharge Instructions (Signed)
General Headache Without Cause [name], take tylenol as needed for pain.  See a primary physician within 3 days for close follow up.. If symptoms worsen, come back to the ED immediately.  Thank you.  A general headache is pain or discomfort felt around the head or neck area. The cause may not be found.  HOME CARE   Keep all doctor visits.  Only take medicines as told by your doctor.  Lie down in a dark, quiet room when you have a headache.  Keep a journal to find out if certain things bring on headaches. For example, write down:  What you eat and drink.  How much sleep you get.  Any change to your diet or medicines.  Relax by getting a massage or doing other relaxing activities.  Put ice or heat packs on the head and neck area as told by your doctor.  Lessen stress.  Sit up straight. Do not tighten (tense) your muscles.  Quit smoking if you smoke.  Lessen how much alcohol you drink.  Lessen how much caffeine you drink, or stop drinking caffeine.  Eat and sleep on a regular schedule.  Get 7 to 9 hours of sleep, or as told by your doctor.  Keep lights dim if bright lights bother you or make your headaches worse. GET HELP RIGHT AWAY IF:   Your headache becomes really bad.  You have a fever.  You have a stiff neck.  You have trouble seeing.  Your muscles are weak, or you lose muscle control.  You lose your balance or have trouble walking.  You feel like you will pass out (faint), or you pass out.  You have really bad symptoms that are different than your first symptoms.  You have problems with the medicines given to you by your doctor.  Your medicines do not work.  Your headache feels different than the other headaches.  You feel sick to your stomach (nauseous) or throw up (vomit). MAKE SURE YOU:   Understand these instructions.  Will watch your condition.  Will get help right away if you are not doing well or get worse. Document Released:  12/27/2007 Document Revised: 06/11/2011 Document Reviewed: 03/09/2011 [facility] Patient Information 2015 [place], [place]. This information is not intended to replace advice given to you by your health care provider. Make sure you discuss any questions you have with your health care provider.

## 2014-07-31 NOTE — ED Notes (Signed)
Pt in c/o headache since 6pm tonight, rates 6/10 pain, denies n/v with this, states she did not try any medications PTA, no distress noted

## 2014-07-31 NOTE — ED Provider Notes (Addendum)
CSN: [id number]     Arrival date & time 07/30/14  2249 History   First MD Initiated Contact with Patient 07/31/14 0403     Chief Complaint  Patient presents with  . Headache     (Consider location/radiation/quality/duration/timing/severity/associated sxs/prior Treatment) HPI  Patient presents to the ED for headache which began tonight at 6pm/. She states she has had similar headaches in the past.  She denies other neurological symptoms. She denies and fevers or recent illness. She has had no  Nausea or vomiting.  Patient did not take anything to make her symptoms better. Nothing makes it worse.  She has no further complaints.   10 Systems reviewed and are negative for acute change except as noted in the HPI.    Past Medical History  Diagnosis Date  . Pulmonary emboli   . Schizophrenia   . Anxiety   . Depression   . PTSD (post-traumatic stress disorder)   . Cancer     pt denies ever having Any cancer  . Uterus cancer    Past Surgical History  Procedure Laterality Date  . No past surgeries    . Uterine fibroid surgery      uterine cancer  . Multiple extractions with alveoloplasty N/A 06/08/2013    Procedure: Dorma Russell;  Surgeon: Gae Bon, DDS;  Location: [facility];  Service: Oral Surgery;  Laterality: N/A;   Family History  Problem Relation Age of Onset  . Schizophrenia Neg Hx   . Cancer Other    History  Substance Use Topics  . Smoking status: Current Every Day Smoker -- 2.50 packs/day for 15 years    Types: Cigarettes  . Smokeless tobacco: Never Used  . Alcohol Use: Yes     Comment: once montly   OB History    No data available     Review of Systems    Allergies  Acetaminophen and Darvocet  Home Medications   Prior to Admission medications   Medication Sig Start Date End Date Taking? Authorizing Provider  cyclobenzaprine (FLEXERIL) 10 MG tablet Take 1 tablet (10 mg total) by mouth 3 (three) times daily as needed for muscle  spasms. For muscle spasms Patient taking differently: Take 10 mg by mouth 3 (three) times daily. For muscle spasms 01/07/14  Yes Kerrie Buffalo, NP  diazepam (VALIUM) 5 MG tablet Take 5 mg by mouth 3 (three) times daily.   Yes Historical Provider, MD  HYDROcodone-acetaminophen (NORCO) 10-325 MG per tablet Take 1 tablet by mouth 5 (five) times daily as needed for moderate pain.   Yes Historical Provider, MD  traZODone (DESYREL) 50 MG tablet Take 50 mg by mouth at bedtime as needed for sleep.   Yes Historical Provider, MD  warfarin (COUMADIN) 5 MG tablet Take 1 tablet (5 mg total) by mouth daily. For history of blood clot.  Please take 1 tablet (5mg ) on Monday, Tuesday, Thursday, Friday and Saturday Patient taking differently: Take 5-7.5 mg by mouth daily. Takes 5mg  everyday at 1700 except on Sunday takes 7.5mg  01/07/14  Yes Kerrie Buffalo, NP   BP 94/78 mmHg  Pulse 91  Temp(Src) 97.8 F (36.6 C) (Oral)  Resp 16  SpO2 100%  LMP 06/13/2014 Physical Exam  Constitutional: She is oriented to person, place, and time. She appears well-developed and well-nourished. No distress.  HENT:  Head: Normocephalic and atraumatic.  Nose: Nose normal.  Mouth/Throat: Oropharynx is clear and moist. No oropharyngeal exudate.  Eyes: Conjunctivae and EOM are normal. Pupils are equal, round,  and reactive to light. No scleral icterus.  Neck: Normal range of motion. Neck supple. No JVD present. No tracheal deviation present. No thyromegaly present.  Cardiovascular: Normal rate, regular rhythm and normal heart sounds.  Exam reveals no gallop and no friction rub.   No murmur heard. Pulmonary/Chest: Effort normal and breath sounds normal. No respiratory distress. She has no wheezes. She exhibits no tenderness.  Abdominal: Soft. Bowel sounds are normal. She exhibits no distension and no mass. There is no tenderness. There is no rebound and no guarding.  Musculoskeletal: Normal range of motion. She exhibits no edema or  tenderness.  Lymphadenopathy:    She has no cervical adenopathy.  Neurological: She is alert and oriented to person, place, and time. No cranial nerve deficit. She exhibits normal muscle tone.  Normal strength and sensation, normal cerebella testing and gait  Skin: Skin is warm and dry. No rash noted. No erythema. No pallor.  Nursing note and vitals reviewed.   ED Course  Procedures (including critical care time) Labs Review Labs Reviewed - No data to display  Imaging Review No results found.   EKG Interpretation None      MDM   Final diagnoses:  None    Patient presents to the ED for headache.  I have low suspicion for meningitis or SAH given her history.  She was treated with toradol, reglan, and benadryl for pain relief.  She will be DC with tylenol to take at home as needed as the patient is on warfarin.  Follow up pcp within 3 days.  Return precautions given.  Her VS remain within her normal limits and she is safe for DC    Everlene Balls, MD 07/31/14 0445  Everlene Balls, MD 07/31/14 [phone]

## 2014-07-31 NOTE — ED Notes (Signed)
MD at bedside. 

## 2014-08-01 LAB — MITOCHONDRIAL M2 AB: Michochondrial (M2) Ab: 7.3 Units (ref 0.0–20.0)

## 2014-08-01 LAB — ACTIN (SMOOTH MUSCLE) ANTIBODY
Actin (Smooth Muscle) Ab: 26 Units — ABNORMAL HIGH (ref 0–19)
Smooth Muscle Ab: 26 Units — ABNORMAL HIGH (ref 0–19)

## 2014-08-04 ENCOUNTER — Telehealth

## 2014-08-04 NOTE — Telephone Encounter (Signed)
There were a few tests that were positive that could indicate autoimmune/rheumatologic issue. I can't say for certain without further evaluation, or if that is the reason why her alk phos was elevated.   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 I am going to refer her to rheumatology

## 2014-08-04 NOTE — Telephone Encounter (Signed)
i called pt no answer lmom for pt to return call

## 2014-08-24 NOTE — Telephone Encounter (Signed)
Patient called stating that she received a letter from you dated the 13th and I do not see any thing like that.  The only one I seen was for 4th about rheumatology and she is scheduled for the 6th of June.

## 2014-08-25 NOTE — Telephone Encounter (Signed)
I sent her a letter stating we have been unable to contact her. Can you look at the phone message and just let her know what it said.  The main thing is referring her to rheumatology, but she is not going to know why.

## 2014-08-25 NOTE — Telephone Encounter (Signed)
Spoke with pt everythingis fine

## 2014-10-05 NOTE — Telephone Encounter (Signed)
Pt called and said she has seen rheumatologist and they sent to her to gastroenterologist. They did endoscopy, colonoscopy and stomach ultrasound. She then returned to rheumatologist per their advice and she left their office only being told her alkaline phosphate was still elevated. She wanted to speak with Gretchen's nurse. I let her know that this may require an appointment.    Patient proceeded to refuse an appointment because she said between three doctors someone should be able to tell her what is going on. She wants answers before she has to set foot in another doctors office and pay a copay. She stated Vinnie LangtonGretchen started all of this by telling her the alkaline phosphate was elevated. I let patient know I could just route the phone call since appointment was declined.    Patient then said "Can I just ask you one more question?" I said "Sure."    Patient proceeded to ask what kind of nursing degree I had. I let her know I do not have one, but in this type of situation an appointment may need to be made. 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 I told patient I was not assuming and that I could just route the phone message and have nurse return her call. She said that would be fine. She then asked for my name. I gave her my name. Patient said "Oh, I should have known." I did not respond. She said "Can you just have someone call me back." I said "Yes." Call ended.

## 2014-10-07 NOTE — Telephone Encounter (Signed)
LVM for patient

## 2014-10-07 NOTE — Telephone Encounter (Signed)
Please schedule pt to come in for a visit and consult with Dr Aline AugustHolmes who is an internal medicine MD.

## 2015-01-03 ENCOUNTER — Emergency Department (HOSPITAL_COMMUNITY)
Admission: EM | Admit: 2015-01-03 | Discharge: 2015-01-03 | Disposition: A | Discharge status: Home / Self Care | Payer: Medicaid Other | Attending: Emergency Medicine | Admitting: Emergency Medicine

## 2015-01-03 ENCOUNTER — Encounter (HOSPITAL_COMMUNITY): Payer: Self-pay

## 2015-01-03 DIAGNOSIS — M549 Dorsalgia, unspecified: Secondary | ICD-10-CM | POA: Insufficient documentation

## 2015-01-03 DIAGNOSIS — Z8719 Personal history of other diseases of the digestive system: Secondary | ICD-10-CM | POA: Insufficient documentation

## 2015-01-03 DIAGNOSIS — F329 Major depressive disorder, single episode, unspecified: Secondary | ICD-10-CM | POA: Insufficient documentation

## 2015-01-03 DIAGNOSIS — R600 Localized edema: Secondary | ICD-10-CM | POA: Insufficient documentation

## 2015-01-03 DIAGNOSIS — R21 Rash and other nonspecific skin eruption: Secondary | ICD-10-CM | POA: Insufficient documentation

## 2015-01-03 DIAGNOSIS — F419 Anxiety disorder, unspecified: Secondary | ICD-10-CM | POA: Diagnosis not present

## 2015-01-03 DIAGNOSIS — F209 Schizophrenia, unspecified: Secondary | ICD-10-CM | POA: Diagnosis not present

## 2015-01-03 DIAGNOSIS — Z720 Tobacco use: Secondary | ICD-10-CM | POA: Diagnosis not present

## 2015-01-03 DIAGNOSIS — Z862 Personal history of diseases of the blood and blood-forming organs and certain disorders involving the immune mechanism: Secondary | ICD-10-CM | POA: Diagnosis not present

## 2015-01-03 DIAGNOSIS — Z7901 Long term (current) use of anticoagulants: Secondary | ICD-10-CM | POA: Diagnosis not present

## 2015-01-03 DIAGNOSIS — Z8679 Personal history of other diseases of the circulatory system: Secondary | ICD-10-CM | POA: Insufficient documentation

## 2015-01-03 DIAGNOSIS — Z8542 Personal history of malignant neoplasm of other parts of uterus: Secondary | ICD-10-CM | POA: Diagnosis not present

## 2015-01-03 DIAGNOSIS — F4310 Post-traumatic stress disorder, unspecified: Secondary | ICD-10-CM | POA: Insufficient documentation

## 2015-01-03 DIAGNOSIS — Z87448 Personal history of other diseases of urinary system: Secondary | ICD-10-CM | POA: Diagnosis not present

## 2015-01-03 DIAGNOSIS — Z79899 Other long term (current) drug therapy: Secondary | ICD-10-CM | POA: Diagnosis not present

## 2015-01-03 DIAGNOSIS — Z86711 Personal history of pulmonary embolism: Secondary | ICD-10-CM | POA: Diagnosis not present

## 2015-01-03 DIAGNOSIS — M7989 Other specified soft tissue disorders: Secondary | ICD-10-CM | POA: Diagnosis present

## 2015-01-03 HISTORY — DX: Endocarditis, valve unspecified: I38

## 2015-01-03 HISTORY — DX: Elevated white blood cell count, unspecified: D72.829

## 2015-01-03 HISTORY — DX: Liver disease, unspecified: K76.9

## 2015-01-03 HISTORY — DX: Acute kidney failure, unspecified: N17.9

## 2015-01-03 HISTORY — DX: Raynaud's syndrome without gangrene: I73.00

## 2015-01-03 HISTORY — DX: Tachycardia, unspecified: R00.0

## 2015-01-03 LAB — BASIC METABOLIC PANEL
ANION GAP: 8 (ref 5–15)
BUN: 6 mg/dL (ref 6–20)
CALCIUM: 8.4 mg/dL — AB (ref 8.9–10.3)
CO2: 25 mmol/L (ref 22–32)
CREATININE: 0.85 mg/dL (ref 0.44–1.00)
Chloride: 102 mmol/L (ref 101–111)
Glucose, Bld: 65 mg/dL (ref 65–99)
Potassium: 3.7 mmol/L (ref 3.5–5.1)
SODIUM: 135 mmol/L (ref 135–145)

## 2015-01-03 LAB — CBC
HCT: 33.5 % — ABNORMAL LOW (ref 36.0–46.0)
HEMOGLOBIN: 11.2 g/dL — AB (ref 12.0–15.0)
MCH: 32.8 pg (ref 26.0–34.0)
MCHC: 33.4 g/dL (ref 30.0–36.0)
MCV: 98.2 fL (ref 78.0–100.0)
PLATELETS: 364 10*3/uL (ref 150–400)
RBC: 3.41 MIL/uL — AB (ref 3.87–5.11)
RDW: 13.8 % (ref 11.5–15.5)
WBC: 7.8 10*3/uL (ref 4.0–10.5)

## 2015-01-03 LAB — PROTIME-INR
INR: 2.2 — AB (ref 0.00–1.49)
PROTHROMBIN TIME: 24.2 s — AB (ref 11.6–15.2)

## 2015-01-03 LAB — BRAIN NATRIURETIC PEPTIDE: B Natriuretic Peptide: 116.2 pg/mL — ABNORMAL HIGH (ref 0.0–100.0)

## 2015-01-03 MED ORDER — NICOTINE 21 MG/24HR TD PT24
21.0000 mg | MEDICATED_PATCH | Freq: Once | TRANSDERMAL | Status: DC
  Administered 2015-01-03: 21 mg via TRANSDERMAL
  Filled 2015-01-03: qty 1

## 2015-01-03 NOTE — ED Notes (Signed)
Pt had some of her medication changed and was started on Risperdal M tabs 18th possibly but then was switched to regular tabs on the 21st and bilateral feet started swelling on the 22nd. This morning when she woke up her fingers were swollen. The ring on her left hand ring finger she can't get off.

## 2015-01-03 NOTE — Discharge Instructions (Signed)
Peripheral Edema °You have swelling in your legs (peripheral edema). This swelling is due to excess accumulation of salt and water in your body. Edema may be a sign of heart, kidney or liver disease, or a side effect of a medication. It may also be due to problems in the leg veins. Elevating your legs and using special support stockings may be very helpful, if the cause of the swelling is due to poor venous circulation. Avoid long periods of standing, whatever the cause. °Treatment of edema depends on identifying the cause. Chips, pretzels, pickles and other salty foods should be avoided. Restricting salt in your diet is almost always needed. Water pills (diuretics) are often used to remove the excess salt and water from your body via urine. These medicines prevent the kidney from reabsorbing sodium. This increases urine flow. °Diuretic treatment may also result in lowering of potassium levels in your body. Potassium supplements may be needed if you have to use diuretics daily. Daily weights can help you keep track of your progress in clearing your edema. You should call your caregiver for follow up care as recommended. °SEEK IMMEDIATE MEDICAL CARE IF:  °· You have increased swelling, pain, redness, or heat in your legs. °· You develop shortness of breath, especially when lying down. °· You develop chest or abdominal pain, weakness, or fainting. °· You have a fever. °Document Released: 04/26/2004 Document Revised: 06/11/2011 Document Reviewed: 04/06/2009 °ExitCare® Patient Information ©2015 [employer]. This information is not intended to replace advice given to you by your health care provider. Make sure you discuss any questions you have with your health care provider. ° °

## 2015-01-03 NOTE — ED Notes (Signed)
Dr. Wofford at bedside 
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
## 2015-01-03 NOTE — ED Notes (Signed)
Patient appears anxious, requesting to smoke and asking about the discharge instructions/paperwork.  MD made aware.  This RN explained to patient that doctor would be working on her discharge.  Patient not wanting to wait for discharge paperwork from the doctor and left the department.

## 2016-04-06 ENCOUNTER — Encounter: Attending: Infectious Disease | Primary: Internal Medicine

## 2016-04-20 ENCOUNTER — Encounter: Attending: Infectious Disease | Primary: Internal Medicine

## 2016-04-25 ENCOUNTER — Inpatient Hospital Stay: Admit: 2016-04-25 | Payer: BLUE CROSS/BLUE SHIELD | Attending: Infectious Disease | Primary: Internal Medicine

## 2016-04-25 DIAGNOSIS — L408 Other psoriasis: Secondary | ICD-10-CM

## 2016-04-25 NOTE — H&P (Addendum)
[facility] WOUND CARE INITIAL/ follow up NOTE      Patient: Crystal Rollins MRN: [id number]  SSN: [id number]    Date of Birth: [birth year]  Age: 46 y.o.  Sex: female      Primary Care Provider:  Joesph Fillers, MD  Date of Visit: 04/25/2016    Assessment : Follow up Visit Week:     PLAN / RECCOMENDATIONS:    FIRST VISIT on 04-25-16    ASSESSMENT:   - Bilateral axillary  and under breast HS. Rt axilla with small open area     On Humara and dapsone per Derm. 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 Patent Urachus     - Morbid Obesity- gastric bypass in 1996.  - Asthma      WOUND POA CONDITIONS            PLAN /RECOMMENDATIONS:  Prisma to Lt axilla  Continue Humira  Alternate Dapsone with Doxycycline for a month at a time  Ct scan Abd with oral contrast  Chlorhexidine baths  Counseled at length about keeping skin clean and dry - Gave some products to try.        PROCEDURE NOTE:        FIRST VISIT DATA: on   04-25-16  Reason for consult:   Crystal Rollins is 46 y.o. female on whom Wound Care Service is being consulted for HS related skin ulcers    History of Present Illness: On First Visit  Pt has a long H/O Hs since childhood. Treated initially as boils. More recently has been on Humira and Dapsone with good control  Long standing discharge from umbillicus.  No systemic symptoms at this point.    Patient Active Problem List   Diagnosis Code   ??? Mild intermittent asthma without complication J45.20   ??? Morbid obesity with body mass index of 45.0-49.9 in adult (HCC) E66.01, Z68.42     Allergies   Allergen Reactions   ??? Adderall [Dextroamphetamine-Amphetamine] Rash   ??? Oxycodone Shortness of Breath and Rash   ??? Ritalin [Methylphenidate] Rash   ??? Strattera [Atomoxetine] Rash   ??? Tylox [Oxycodone-Acetaminophen] Shortness of Breath and Rash      Past Medical History:   Diagnosis Date   ??? Asthma    ??? Depression    ??? Psoriasis    ??? Sleep apnea      Past Surgical History:   Procedure Laterality Date    ??? HX CHOLECYSTECTOMY      may 1996   ??? HX GASTRIC BYPASS      may 1996   ??? HX ORTHOPAEDIC      right knee june 2008   ??? HX TUBAL LIGATION      10/1993     Social History   Substance Use Topics   ??? Smoking status: Former Smoker     Packs/day: 2.00     Years: 10.00     Quit date: 04/03/1995   ??? Smokeless tobacco: Never Used   ??? Alcohol use No     Family History   Problem Relation Age of Onset   ??? Cancer Mother    ??? Diabetes Mother    ??? Diabetes Father    ??? COPD Father    ??? Cancer Maternal Aunt    ??? Cancer Maternal Grandmother    ??? Heart Disease Maternal Grandmother    ??? Diabetes Maternal Grandmother    ??? Heart Disease Maternal Grandfather    ???  Cancer Maternal Grandfather      Prior to Admission medications    Medication Sig Start Date End Date Taking? Authorizing Provider   VYVANSE 30 mg capsule  05/05/14   Historical Provider   levomilnacipran (FETZIMA) 40 mg ER capsule Take  by mouth daily.    Historical Provider   zolpidem (AMBIEN) 5 mg tablet  05/16/14   Historical Provider   VYVANSE 50 mg capsule  06/03/14   Historical Provider   hydrocortisone valerate (WESTCORT) 0.2 % topical cream  04/12/14   Historical Provider   albuterol (PROVENTIL HFA, VENTOLIN HFA, PROAIR HFA) 90 mcg/actuation inhaler Take 2 Puffs by inhalation every six (6) hours as needed for Wheezing. 06/09/14   Wardell HonourGretchen M Sapone, PA-C     There are no discontinued medications.  Current Outpatient Prescriptions   Medication Sig   ??? VYVANSE 30 mg capsule    ??? levomilnacipran (FETZIMA) 40 mg ER capsule Take  by mouth daily.   ??? zolpidem (AMBIEN) 5 mg tablet    ??? VYVANSE 50 mg capsule    ??? hydrocortisone valerate (WESTCORT) 0.2 % topical cream    ??? albuterol (PROVENTIL HFA, VENTOLIN HFA, PROAIR HFA) 90 mcg/actuation inhaler Take 2 Puffs by inhalation every six (6) hours as needed for Wheezing.     No current facility-administered medications for this encounter.              MICROBIOLOGY:-   No results found for: CULT, CULT1, CULT2, GMS      Antibiotic History:     Current:     S/P:    Radiology:  @IMAGES @      Review of Systems:     Constitutional: negative for chills, diaphoresis, fever, malaise/fatigue, weakness and weight loss   Skin: negative for itching and rash   HENT: negative for ear discharge, ear pain, hearing loss and sore throat   Eyes: negative for blurred vision, double vision and photophobia   Cardiovascular: negative for chest pain, claudication, leg swelling, orthopnea, paroxysmal nocturnal dyspnea   Respiratory: negative for cough, hemoptysis, shortness of breath or sputum production   Gastointestinal: negative for abdominal pain, blood in stool, constipation, diarrhea, melena, nausea and vomiting   Genitourinary: No dysuria, increased frequency, hematuria   Musculoskeletal: negative for back pain, joint pain and myalgias   Endo: negative for cold intolerance, heat intolerance, polydipsia and polyuria.   Heme: negative for easy bleeding and easy bruising   Allergies: negative for hives   Neurological: negative for headaches, dizziness, focal weakness, level of consciousness, seizures and tingling   Psychiatric:  negative for depression, hallucinations and suicidal ideals       Objective:     No data found.      Constitutional: well developed, nourished, no distress and alert and oriented x 3   HENT: atraumatic, nose normal, normocephalic and oropharynx clear and moist   Eyes: conjunctiva normal, EOM normal and PERRL   Neck: ROM normal, supple, trachea normal and no adenopathy, thrush. MMM   Cardiovascular: heart sounds normal, intact distal pulses, normal rate and regular rhythm   Pulmonary/Chest Wall: breath sounds normal and effort normal   Abdominal: appearance normal, bowel sounds normal, soft and No rebound, gaurding or tenderness   Genitourinary/Anorectal: deferred   Musculoskeletal: normal ROM   Neurological: awake, alert and oriented x 3, and   cranial nerves intact  muscular strength 5/5 and symmetric  reflexes normal and symmetric  sensory normal        Skin: dry,  intact and warm    Ares of psoriasis , Bilateral axillary scarring, Umbilical discharge       Labwork and Ancillary Studies   CBC w/Diff  Lab Results   Component Value Date/Time    WBC 4.5 06/09/2014 09:30 AM    RBC 4.79 06/09/2014 09:30 AM    HCT 41.9 06/09/2014 09:30 AM    MCV 88 06/09/2014 09:30 AM    MCH 27.3 06/09/2014 09:30 AM    MCHC 31.3 (L) 06/09/2014 09:30 AM    RDW 15.7 (H) 06/09/2014 09:30 AM    MONOS 6 06/09/2014 09:30 AM    EOS 3 06/09/2014 09:30 AM    BASOS 1 06/09/2014 09:30 AM       Comprehensive Metabolic Profile  Lab Results   Component Value Date/Time    NA 142 06/09/2014 09:30 AM    CO2 22 06/09/2014 09:30 AM    BUN 8 06/09/2014 09:30 AM         Harlin Heys, MD  Pager: [phone]  [facility] Wound Care Staff

## 2016-04-26 NOTE — Wound Image (Signed)
04/25/16 1536   Wound Axilla Right   Date First Assessed: 04/25/16   POA: Yes  Wound Type: Abcess  Location: Axilla  Orientation: Right   DRESSING TYPE Open to air   Wound Length (cm) 0.7 cm   Wound Width (cm) 1 cm   Wound Depth (cm) 0.5   Wound Surface area (cm^3) 0.35 cm^2   Condition of Base Pink   Tissue Type Pink   Tissue Type Percent Pink 100   Drainage Amount  Small    Drainage Color Serous   Wound Odor Mild   Periwound Skin Condition Other (comment)  (Scar tissue )   Cleansing and Cleansing Agents  Dermal wound cleanser   Dressing Type Applied Other (Comment)  (Prisma collagen with silver, foam)   Procedure Tolerated Well   Wound Breast Right;Lower   Date First Assessed: 04/25/16   POA: Yes  Wound Type: Abcess  Location: Breast  Orientation: Right;Lower   DRESSING TYPE Open to air   Wound Length (cm) 0.2 cm   Wound Width (cm) 0.2 cm   Wound Depth (cm) 0.1   Wound Surface area (cm^3) 0 cm^2   Condition of Base Pink   Tissue Type Pink   Tissue Type Percent Pink 100   Drainage Amount  Scant   Drainage Color Serous   Wound Odor None   Periwound Skin Condition Excoriated   Cleansing and Cleansing Agents  Dermal wound cleanser   Dressing Type Applied Other (Comment)  (None)   Wound Umbilicus   Date First Assessed: 04/25/16   POA: Yes  Wound Type: Abcess  Location: Umbilicus   DRESSING TYPE Open to air   Wound Length (cm) (Uanble to assess, drainge source is deep in umbilicus)   Condition of Base Pink   Drainage Amount  Small    Drainage Color Serous   Wound Odor Pungent   Periwound Skin Condition Erythema, blanchable   Cleansing and Cleansing Agents  Dermal wound cleanser   Dressing Type Applied Other (Comment)  (Charcoal pads for odor)   Procedure Tolerated Well     Patient will follow up with her dermatologist Dr. Janace HoardJennifer Pike to continue her care. Recommendations for treatment from Dr. Alexandria LodgeKhardori will be sent to Dr. Jodi MarblePike.

## 2016-12-01 DEATH — deceased

## 2018-08-20 ENCOUNTER — Inpatient Hospital Stay: Admit: 2018-08-20 | Payer: BLUE CROSS/BLUE SHIELD | Primary: Internal Medicine

## 2018-08-20 DIAGNOSIS — Z124 Encounter for screening for malignant neoplasm of cervix: Secondary | ICD-10-CM

## 2018-08-26 LAB — PAP + HPV E6/E7, RFLX HPV 16, 18/45
HPV MRNA E6/E7: NOT DETECTED
HPV mRNA E6/E7: NOT DETECTED

## 2020-03-15 DIAGNOSIS — Z124 Encounter for screening for malignant neoplasm of cervix: Secondary | ICD-10-CM

## 2020-03-16 ENCOUNTER — Inpatient Hospital Stay: Admit: 2020-03-16 | Payer: BLUE CROSS/BLUE SHIELD | Primary: Internal Medicine

## 2020-04-15 LAB — PAP + HPV E6/E7, RFLX HPV 16, 18/45
